# Patient Record
Sex: Female | Born: 1945
Health system: Southern US, Community
[De-identification: ages and names within clinical notes are randomized; demographics above are authoritative.]

## PROBLEM LIST (undated history)

## (undated) DIAGNOSIS — T7840XA Allergy, unspecified, initial encounter: Secondary | ICD-10-CM

## (undated) DIAGNOSIS — I00 Rheumatic fever without heart involvement: Secondary | ICD-10-CM

## (undated) DIAGNOSIS — S129XXA Fracture of neck, unspecified, initial encounter: Secondary | ICD-10-CM

## (undated) DIAGNOSIS — D869 Sarcoidosis, unspecified: Secondary | ICD-10-CM

## (undated) DIAGNOSIS — F32A Depression, unspecified: Secondary | ICD-10-CM

## (undated) DIAGNOSIS — E785 Hyperlipidemia, unspecified: Secondary | ICD-10-CM

## (undated) DIAGNOSIS — G2581 Restless legs syndrome: Secondary | ICD-10-CM

## (undated) DIAGNOSIS — I209 Angina pectoris, unspecified: Secondary | ICD-10-CM

## (undated) DIAGNOSIS — E039 Hypothyroidism, unspecified: Secondary | ICD-10-CM

## (undated) DIAGNOSIS — R51 Headache: Secondary | ICD-10-CM

## (undated) DIAGNOSIS — Z86010 Personal history of colonic polyps: Secondary | ICD-10-CM

## (undated) DIAGNOSIS — S322XXA Fracture of coccyx, initial encounter for closed fracture: Secondary | ICD-10-CM

## (undated) DIAGNOSIS — S129XXD Fracture of neck, unspecified, subsequent encounter: Secondary | ICD-10-CM

## (undated) DIAGNOSIS — S2222XA Fracture of body of sternum, initial encounter for closed fracture: Secondary | ICD-10-CM

## (undated) DIAGNOSIS — S82841A Displaced bimalleolar fracture of right lower leg, initial encounter for closed fracture: Secondary | ICD-10-CM

## (undated) DIAGNOSIS — E079 Disorder of thyroid, unspecified: Secondary | ICD-10-CM

## (undated) DIAGNOSIS — M797 Fibromyalgia: Secondary | ICD-10-CM

## (undated) DIAGNOSIS — Z9889 Other specified postprocedural states: Secondary | ICD-10-CM

## (undated) DIAGNOSIS — R519 Headache, unspecified: Secondary | ICD-10-CM

## (undated) DIAGNOSIS — K759 Inflammatory liver disease, unspecified: Secondary | ICD-10-CM

## (undated) DIAGNOSIS — R079 Chest pain, unspecified: Secondary | ICD-10-CM

## (undated) DIAGNOSIS — F329 Major depressive disorder, single episode, unspecified: Secondary | ICD-10-CM

## (undated) DIAGNOSIS — R112 Nausea with vomiting, unspecified: Secondary | ICD-10-CM

## (undated) DIAGNOSIS — R937 Abnormal findings on diagnostic imaging of other parts of musculoskeletal system: Secondary | ICD-10-CM

## (undated) HISTORY — DX: Fracture of coccyx, initial encounter for closed fracture: S32.2XXA

## (undated) HISTORY — DX: Abnormal findings on diagnostic imaging of other parts of musculoskeletal system: R93.7

## (undated) HISTORY — DX: Displaced bimalleolar fracture of right lower leg, initial encounter for closed fracture: S82.841A

## (undated) HISTORY — DX: Allergy, unspecified, initial encounter: T78.40XA

## (undated) HISTORY — DX: Chest pain, unspecified: R07.9

## (undated) HISTORY — DX: Fracture of neck, unspecified, subsequent encounter: S12.9XXD

## (undated) HISTORY — DX: Inflammatory liver disease, unspecified: K75.9

## (undated) HISTORY — DX: Restless legs syndrome: G25.81

## (undated) HISTORY — DX: Rheumatic fever without heart involvement: I00

## (undated) HISTORY — DX: Fracture of body of sternum, initial encounter for closed fracture: S22.22XA

## (undated) HISTORY — DX: Hyperlipidemia, unspecified: E78.5

## (undated) HISTORY — DX: Fracture of neck, unspecified, initial encounter: S12.9XXA

## (undated) HISTORY — DX: Angina pectoris, unspecified: I20.9

## (undated) HISTORY — DX: Personal history of colonic polyps: Z86.010

## (undated) HISTORY — DX: Disorder of thyroid, unspecified: E07.9

## (undated) HISTORY — PX: CATARACT EXTRACTION, BILATERAL: SHX1313

---
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
## 1976-12-28 HISTORY — PX: TUBAL LIGATION: SHX77

## 1998-06-17 ENCOUNTER — Other Ambulatory Visit: Admission: RE | Admit: 1998-06-17 | Discharge: 1998-06-17 | Payer: Self-pay | Admitting: Obstetrics and Gynecology

## 2002-10-12 ENCOUNTER — Encounter: Payer: Self-pay | Admitting: Obstetrics and Gynecology

## 2002-10-12 ENCOUNTER — Encounter: Admission: RE | Admit: 2002-10-12 | Discharge: 2002-10-12 | Payer: Self-pay | Admitting: Obstetrics and Gynecology

## 2005-04-22 ENCOUNTER — Ambulatory Visit: Payer: Self-pay | Admitting: Internal Medicine

## 2005-05-04 ENCOUNTER — Ambulatory Visit: Payer: Self-pay | Admitting: Internal Medicine

## 2005-05-05 ENCOUNTER — Encounter: Admission: RE | Admit: 2005-05-05 | Discharge: 2005-05-05 | Payer: Self-pay | Admitting: Obstetrics and Gynecology

## 2005-05-14 ENCOUNTER — Ambulatory Visit: Payer: Self-pay | Admitting: Internal Medicine

## 2005-05-28 ENCOUNTER — Encounter (INDEPENDENT_AMBULATORY_CARE_PROVIDER_SITE_OTHER): Payer: Self-pay | Admitting: *Deleted

## 2005-05-28 ENCOUNTER — Ambulatory Visit: Payer: Self-pay | Admitting: Internal Medicine

## 2005-05-28 DIAGNOSIS — Z860100 Personal history of colon polyps, unspecified: Secondary | ICD-10-CM

## 2005-05-28 HISTORY — DX: Personal history of colon polyps, unspecified: Z86.0100

## 2005-05-28 HISTORY — DX: Personal history of colonic polyps: Z86.010

## 2007-04-29 ENCOUNTER — Ambulatory Visit: Payer: Self-pay | Admitting: Internal Medicine

## 2007-07-18 ENCOUNTER — Telehealth: Payer: Self-pay | Admitting: Internal Medicine

## 2007-07-26 ENCOUNTER — Encounter (INDEPENDENT_AMBULATORY_CARE_PROVIDER_SITE_OTHER): Payer: Self-pay

## 2007-08-19 ENCOUNTER — Telehealth (INDEPENDENT_AMBULATORY_CARE_PROVIDER_SITE_OTHER): Payer: Self-pay

## 2008-05-18 ENCOUNTER — Ambulatory Visit: Payer: Self-pay | Admitting: Internal Medicine

## 2008-05-18 LAB — CONVERTED CEMR LAB
ALT: 25 units/L (ref 0–35)
AST: 24 units/L (ref 0–37)
Albumin: 3.6 g/dL (ref 3.5–5.2)
Alkaline Phosphatase: 92 units/L (ref 39–117)
BUN: 11 mg/dL (ref 6–23)
Basophils Absolute: 0.1 10*3/uL (ref 0.0–0.1)
Basophils Relative: 1 % (ref 0.0–1.0)
Bilirubin Urine: NEGATIVE
Bilirubin, Direct: 0.1 mg/dL (ref 0.0–0.3)
Blood in Urine, dipstick: NEGATIVE
CO2: 28 meq/L (ref 19–32)
Calcium: 9 mg/dL (ref 8.4–10.5)
Chloride: 102 meq/L (ref 96–112)
Cholesterol: 244 mg/dL (ref 0–200)
Creatinine, Ser: 0.7 mg/dL (ref 0.4–1.2)
Direct LDL: 175.2 mg/dL
Eosinophils Absolute: 0.4 10*3/uL (ref 0.0–0.7)
Eosinophils Relative: 5.8 % — ABNORMAL HIGH (ref 0.0–5.0)
GFR calc Af Amer: 109 mL/min
GFR calc non Af Amer: 90 mL/min
Glucose, Bld: 89 mg/dL (ref 70–99)
Glucose, Urine, Semiquant: NEGATIVE
HCT: 40 % (ref 36.0–46.0)
HDL: 44.7 mg/dL (ref >39.0)
Hemoglobin: 13.3 g/dL (ref 12.0–15.0)
Ketones, urine, test strip: NEGATIVE
Lymphocytes Relative: 26.3 % (ref 12.0–46.0)
MCHC: 33.4 g/dL (ref 30.0–36.0)
MCV: 92.5 fL (ref 78.0–100.0)
Monocytes Absolute: 0.6 10*3/uL (ref 0.1–1.0)
Monocytes Relative: 8.5 % (ref 3.0–12.0)
Neutro Abs: 4.5 10*3/uL (ref 1.4–7.7)
Neutrophils Relative %: 58.4 % (ref 43.0–77.0)
Nitrite: NEGATIVE
Platelets: 299 10*3/uL (ref 150–400)
Potassium: 4.4 meq/L (ref 3.5–5.1)
Protein, U semiquant: NEGATIVE
RBC: 4.32 M/uL (ref 3.87–5.11)
RDW: 12.8 % (ref 11.5–14.6)
Sodium: 135 meq/L (ref 135–145)
Specific Gravity, Urine: 1.015
TSH: 2.18 microintl units/mL (ref 0.35–5.50)
Total Bilirubin: 0.6 mg/dL (ref 0.3–1.2)
Total CHOL/HDL Ratio: 5.5
Total Protein: 6.9 g/dL (ref 6.0–8.3)
Triglycerides: 159 mg/dL — ABNORMAL HIGH (ref 0–149)
Urobilinogen, UA: 0.2
VLDL: 32 mg/dL (ref 0–40)
WBC Urine, dipstick: NEGATIVE
WBC: 7.6 10*3/uL (ref 4.5–10.5)
pH: 7

## 2008-05-31 ENCOUNTER — Ambulatory Visit: Payer: Self-pay | Admitting: Internal Medicine

## 2009-09-26 ENCOUNTER — Ambulatory Visit: Payer: Self-pay | Admitting: Internal Medicine

## 2009-09-26 LAB — CONVERTED CEMR LAB
ALT: 19 units/L (ref 0–35)
AST: 25 units/L (ref 0–37)
Albumin: 4 g/dL (ref 3.5–5.2)
Alkaline Phosphatase: 92 units/L (ref 39–117)
BUN: 24 mg/dL — ABNORMAL HIGH (ref 6–23)
Basophils Absolute: 0.1 10*3/uL (ref 0.0–0.1)
Basophils Relative: 0.8 % (ref 0.0–3.0)
Bilirubin, Direct: 0 mg/dL (ref 0.0–0.3)
CO2: 31 meq/L (ref 19–32)
Calcium: 9.4 mg/dL (ref 8.4–10.5)
Chloride: 101 meq/L (ref 96–112)
Creatinine, Ser: 0.7 mg/dL (ref 0.4–1.2)
Eosinophils Absolute: 0.2 10*3/uL (ref 0.0–0.7)
Eosinophils Relative: 2.7 % (ref 0.0–5.0)
GFR calc non Af Amer: 89.43 mL/min (ref >60)
Glucose, Bld: 90 mg/dL (ref 70–99)
HCT: 38.4 % (ref 36.0–46.0)
Hemoglobin: 13.5 g/dL (ref 12.0–15.0)
Lymphocytes Relative: 28.5 % (ref 12.0–46.0)
Lymphs Abs: 2.3 10*3/uL (ref 0.7–4.0)
MCHC: 35.2 g/dL (ref 30.0–36.0)
MCV: 92.8 fL (ref 78.0–100.0)
Monocytes Absolute: 0.5 10*3/uL (ref 0.1–1.0)
Monocytes Relative: 6.6 % (ref 3.0–12.0)
Neutro Abs: 5.1 10*3/uL (ref 1.4–7.7)
Neutrophils Relative %: 61.4 % (ref 43.0–77.0)
Platelets: 294 10*3/uL (ref 150.0–400.0)
Potassium: 4.4 meq/L (ref 3.5–5.1)
RBC: 4.14 M/uL (ref 3.87–5.11)
RDW: 12.4 % (ref 11.5–14.6)
Sed Rate: 20 mm/hr (ref 0–22)
Sodium: 139 meq/L (ref 135–145)
TSH: 2.46 microintl units/mL (ref 0.35–5.50)
Total Bilirubin: 0.7 mg/dL (ref 0.3–1.2)
Total Protein: 7.1 g/dL (ref 6.0–8.3)
WBC: 8.2 10*3/uL (ref 4.5–10.5)

## 2009-10-01 ENCOUNTER — Telehealth: Payer: Self-pay | Admitting: Internal Medicine

## 2009-10-25 ENCOUNTER — Ambulatory Visit: Payer: Self-pay | Admitting: Internal Medicine

## 2010-02-25 ENCOUNTER — Telehealth: Payer: Self-pay

## 2010-03-03 ENCOUNTER — Ambulatory Visit: Payer: Self-pay | Admitting: Internal Medicine

## 2010-03-03 LAB — CONVERTED CEMR LAB
ALT: 17 units/L (ref 0–35)
AST: 23 units/L (ref 0–37)
Albumin: 4.1 g/dL (ref 3.5–5.2)
Alkaline Phosphatase: 78 units/L (ref 39–117)
BUN: 15 mg/dL (ref 6–23)
Basophils Absolute: 0.1 10*3/uL (ref 0.0–0.1)
Basophils Relative: 1.2 % (ref 0.0–3.0)
Bilirubin Urine: NEGATIVE
Bilirubin, Direct: 0.1 mg/dL (ref 0.0–0.3)
CO2: 30 meq/L (ref 19–32)
Calcium: 9 mg/dL (ref 8.4–10.5)
Chloride: 102 meq/L (ref 96–112)
Cholesterol: 182 mg/dL (ref 0–200)
Creatinine, Ser: 0.6 mg/dL (ref 0.4–1.2)
Eosinophils Absolute: 0.2 10*3/uL (ref 0.0–0.7)
Eosinophils Relative: 3.8 % (ref 0.0–5.0)
GFR calc non Af Amer: 106.7 mL/min (ref >60)
Glucose, Bld: 99 mg/dL (ref 70–99)
HCT: 41.2 % (ref 36.0–46.0)
HDL: 78.2 mg/dL (ref >39.00)
Hemoglobin, Urine: NEGATIVE
Hemoglobin: 13.5 g/dL (ref 12.0–15.0)
Ketones, ur: NEGATIVE mg/dL
LDL Cholesterol: 92 mg/dL (ref 0–99)
Leukocytes, UA: NEGATIVE
Lymphocytes Relative: 28.8 % (ref 12.0–46.0)
Lymphs Abs: 1.7 10*3/uL (ref 0.7–4.0)
MCHC: 32.7 g/dL (ref 30.0–36.0)
MCV: 95.6 fL (ref 78.0–100.0)
Monocytes Absolute: 0.5 10*3/uL (ref 0.1–1.0)
Monocytes Relative: 8.5 % (ref 3.0–12.0)
Neutro Abs: 3.4 10*3/uL (ref 1.4–7.7)
Neutrophils Relative %: 57.7 % (ref 43.0–77.0)
Nitrite: NEGATIVE
Platelets: 288 10*3/uL (ref 150.0–400.0)
Potassium: 4.5 meq/L (ref 3.5–5.1)
RBC: 4.31 M/uL (ref 3.87–5.11)
RDW: 12.3 % (ref 11.5–14.6)
Sodium: 136 meq/L (ref 135–145)
Specific Gravity, Urine: 1.015 (ref 1.000–1.030)
TSH: 2.78 microintl units/mL (ref 0.35–5.50)
Total Bilirubin: 0.5 mg/dL (ref 0.3–1.2)
Total CHOL/HDL Ratio: 2
Total Protein, Urine: NEGATIVE mg/dL
Total Protein: 7.4 g/dL (ref 6.0–8.3)
Triglycerides: 58 mg/dL (ref 0.0–149.0)
Urine Glucose: NEGATIVE mg/dL
Urobilinogen, UA: 0.2 (ref 0.0–1.0)
VLDL: 11.6 mg/dL (ref 0.0–40.0)
WBC: 5.9 10*3/uL (ref 4.5–10.5)
pH: 8 (ref 5.0–8.0)

## 2010-03-06 ENCOUNTER — Encounter: Admission: RE | Admit: 2010-03-06 | Discharge: 2010-03-06 | Payer: Self-pay | Admitting: Internal Medicine

## 2010-03-06 LAB — HM MAMMOGRAPHY: HM Mammogram: NEGATIVE

## 2010-03-10 ENCOUNTER — Ambulatory Visit: Payer: Self-pay | Admitting: Internal Medicine

## 2010-05-28 ENCOUNTER — Encounter (INDEPENDENT_AMBULATORY_CARE_PROVIDER_SITE_OTHER): Payer: Self-pay | Admitting: *Deleted

## 2010-08-13 ENCOUNTER — Telehealth: Payer: Self-pay | Admitting: Internal Medicine

## 2010-09-10 ENCOUNTER — Encounter (INDEPENDENT_AMBULATORY_CARE_PROVIDER_SITE_OTHER): Payer: Self-pay | Admitting: *Deleted

## 2010-09-11 ENCOUNTER — Ambulatory Visit: Payer: Self-pay | Admitting: Internal Medicine

## 2010-09-18 ENCOUNTER — Telehealth (INDEPENDENT_AMBULATORY_CARE_PROVIDER_SITE_OTHER): Payer: Self-pay | Admitting: *Deleted

## 2010-09-23 ENCOUNTER — Ambulatory Visit: Payer: Self-pay | Admitting: Internal Medicine

## 2010-09-23 LAB — HM COLONOSCOPY

## 2010-09-27 HISTORY — PX: COLONOSCOPY: SHX174

## 2010-10-02 ENCOUNTER — Encounter: Payer: Self-pay | Admitting: Internal Medicine

## 2010-11-28 ENCOUNTER — Telehealth: Payer: Self-pay | Admitting: Internal Medicine

## 2011-01-27 NOTE — Progress Notes (Signed)
Summary: leg numbness  Phone Note Call from Patient Call back at Home Phone [phone]   Caller: Patient Call For: KWIA Reason for Call: Talk to Nurse Summary of Call: C/O LEG ISSUES. NUMBNESS.PLEASR ADVISE. PT ALSO WANTED TO KNOW COULD IT BE HER MEDS? Initial call taken by: Warnell Forester,  July 18, 2007 11:15 AM  Follow-up for Phone Call        I will need to review chart please  Additional Follow-up for Phone Call Additional follow up Details #1::        Discussed with patient.  Has numbness involving l lat lower leg after trauma.  Mirapex has helped the RLS.  Will call if any new symptoms    working all day.  There is on her right

## 2011-01-27 NOTE — Assessment & Plan Note (Signed)
Summary: ?skin irratation/feels like having RLS all over/cjr   Vital Signs:  Patient profile:   65 year old female Weight:      161 pounds BMI:     28.17 Temp:     98.6 degrees F oral BP sitting:   122 / 78  (left arm) Cuff size:   regular  Vitals Entered By: Raechel Ache, RN (September 26, 2009 10:03 AM) CC: C/o itchy, prickly feeling all over. Is Patient Diabetic? No Flu Vaccine Consent Questions     Do you have a history of severe allergic reactions to this vaccine? no    Any prior history of allergic reactions to egg and/or gelatin? no    Do you have a sensitivity to the preservative Thimersol? no    Do you have a past history of Guillan-Barre Syndrome? no    Do you currently have an acute febrile illness? no    Have you ever had a severe reaction to latex? no    Vaccine information given and explained to patient? yes    Are you currently pregnant? no    Lot Number:[id number]   Exp Date:06/26/2010   Site Given  Left Deltoid IM   CC:  C/o itchy and prickly feeling all over.Marland Kitchen  History of Present Illness: 65 year old patient, who presents with a few week history of pruritus without rash.  She is at a very stressful summer and earlier.  This was associated with some hair loss.  The hair loss seems to have resolved, but she continues to be bothered by itching.  There have been no new medications.  She has been on Claritin once daily chronically.  Symptoms have worsened recently in spite of improvement in her stressors.  She has a history of fibromyalgia, what seems to be stable.  Allergies: No Known Drug Allergies  Past History:  Past Medical History: Reviewed history from 05/31/2008 and no changes required. fibromyalgia remote history hepatitis at age 52 gravida 3, para 3, abortus zero history of acute rheumatic fever Colonic polyps, hx of restless leg syndrome  Review of Systems  The patient denies anorexia, fever, weight loss, weight gain, vision loss, decreased  hearing, hoarseness, chest pain, syncope, dyspnea on exertion, peripheral edema, prolonged cough, headaches, hemoptysis, abdominal pain, melena, hematochezia, severe indigestion/heartburn, hematuria, incontinence, genital sores, muscle weakness, suspicious skin lesions, transient blindness, difficulty walking, depression, unusual weight change, abnormal bleeding, enlarged lymph nodes, angioedema, and breast masses.    Physical Exam  General:  Well-developed,well-nourished,in no acute distress; alert,appropriate and cooperative throughout examination Head:  Normocephalic and atraumatic without obvious abnormalities. No apparent alopecia or balding. Eyes:  No corneal or conjunctival inflammation noted. EOMI. Perrla. Funduscopic exam benign, without hemorrhages, exudates or papilledema. Vision grossly normal. Ears:  External ear exam shows no significant lesions or deformities.  Otoscopic examination reveals clear canals, tympanic membranes are intact bilaterally without bulging, retraction, inflammation or discharge. Hearing is grossly normal bilaterally. Mouth:  Oral mucosa and oropharynx without lesions or exudates.  Teeth in good repair. Neck:  No deformities, masses, or tenderness noted. Lungs:  Normal respiratory effort, chest expands symmetrically. Lungs are clear to auscultation, no crackles or wheezes. Heart:  Normal rate and regular rhythm. S1 and S2 normal without gallop, murmur, click, rub or other extra sounds. Abdomen:  Bowel sounds positive,abdomen soft and non-tender without masses, organomegaly or hernias noted. Msk:  No deformity or scoliosis noted of thoracic or lumbar spine.   Pulses:  R and L carotid,radial,femoral,dorsalis pedis and posterior tibial  pulses are full and equal bilaterally   Impression & Recommendations:  Problem # 1:  PRURITUS (ICD-698.9)  Problem # 2:  FIBROMYALGIA (ICD-729.1)  Her updated medication list for this problem includes:    Adult Aspirin Ec Low  Strength 81 Mg Tbec (Aspirin) ..... Once daily    Aleve 220 Mg Tabs (Naproxen sodium) .Marland Kitchen..Marland Kitchen Two times a day    Her updated medication list for this problem includes:    Adult Aspirin Ec Low Strength 81 Mg Tbec (Aspirin) ..... Once daily    Aleve 220 Mg Tabs (Naproxen sodium) .Marland Kitchen..Marland Kitchen Two times a day  Complete Medication List: 1)  Zoloft 100 Mg Tabs (Sertraline hcl) .Marland Kitchen.. 1 once daily 2)  Adult Aspirin Ec Low Strength 81 Mg Tbec (Aspirin) .... Once daily 3)  Claritin 10 Mg Tabs (Loratadine) .... Take 1 tablet by mouth once a day 4)  Daily Multiple Vitamins Tabs (Multiple vitamin) .... Take 1 tablet by mouth once a day 5)  Glucosamine-chondroitin 1500-1200 Mg/73ml Liqd (Glucosamine-chondroitin) .... Take 1 tablet by mouth once a day 6)  Ok-garlic Tabs (Garlic) .... Take 1 tablet by mouth once a day 7)  Coq10 30 Mg Caps (Coenzyme q10) .... 60 mg  by mouth once a day 8)  Calcium 600/vitamin D 600-400 Mg-unit Tabs (Calcium carbonate-vitamin d) .... Two two times a day 9)  Aleve 220 Mg Tabs (Naproxen sodium) .... Two times a day 10)  Finacea 15 % Gel (Azelaic acid) .... Two times a day for rasacea 11)  Mirapex 0.5 Mg Tabs (Pramipexole dihydrochloride) .Marland Kitchen.. 1 at bedtime 12)  Acid Reducer Maximum Strength 150 Mg Tabs (Ranitidine hcl) .... One twice daily 13)  Hydroxyzine Hcl 50 Mg Tabs (Hydroxyzine hcl) .... One every 6 hours as needed for itching  Other Orders: Admin 1st Vaccine (62694) Flu Vaccine 66yrs + (85462) Venipuncture (70350) TLB-BMP (Basic Metabolic Panel-BMET) (80048-METABOL) TLB-CBC Platelet - w/Differential (85025-CBCD) TLB-Hepatic/Liver Function Pnl (80076-HEPATIC) TLB-TSH (Thyroid Stimulating Hormone) (84443-TSH) TLB-Sedimentation Rate (ESR) (85652-ESR)  Patient Instructions: 1)  Please schedule a follow-up appointment as needed. 2)  Schedule CPX Prescriptions: HYDROXYZINE HCL 50 MG TABS (HYDROXYZINE HCL) one every 6 hours as needed for itching  #60 x 2   Entered and  Authorized by:   Gordy Savers  MD   Signed by:   Gordy Savers  MD on 09/26/2009   Method used:   Print then Give to Patient   RxID:   [id number] ACID REDUCER MAXIMUM STRENGTH 150 MG TABS (RANITIDINE HCL) one twice daily  #180 x 0   Entered and Authorized by:   Gordy Savers  MD   Signed by:   Gordy Savers  MD on 09/26/2009   Method used:   Print then Give to Patient   RxID:   [id number] MIRAPEX 0.5 MG TABS (PRAMIPEXOLE DIHYDROCHLORIDE) 1 at bedtime  #90 x 6   Entered and Authorized by:   Gordy Savers  MD   Signed by:   Gordy Savers  MD on 09/26/2009   Method used:   Print then Give to Patient   RxID:   [id number] CLARITIN 10 MG  TABS (LORATADINE) Take 1 tablet by mouth once a day  #90 x 6   Entered and Authorized by:   Gordy Savers  MD   Signed by:   Gordy Savers  MD on 09/26/2009   Method used:   Print then Give to Patient   RxID:   [id number]

## 2011-01-27 NOTE — Progress Notes (Signed)
Summary: prep ?'s  Phone Note Call from Patient Call back at Home Phone [phone]   Caller: Patient Call For: Dr. Leone Payor Reason for Call: Talk to Nurse Summary of Call: prep ?'s Initial call taken by: Vallarie Mare,  September 18, 2010 3:52 PM  Follow-up for Phone Call        Phone Call Completed Follow-up by: Wyona Almas RN,  September 18, 2010 5:06 PM

## 2011-01-27 NOTE — Letter (Signed)
Summary: Colonoscopy Letter  [facility]  [address] [place], [place] [address]   Phone: [phone]  Fax: [phone]      May 28, 2010 MRN: [id number]   Grace Schmitt [address] [place], [place]  [address]   Dear Ms. Clinkenbeard,   According to your medical record, it is time for you to schedule a Colonoscopy. The American Cancer Society recommends this procedure as a method to detect early colon cancer. Patients with a family history of colon cancer, or a personal history of colon polyps or inflammatory bowel disease are at increased risk.  This letter has beeen generated based on the recommendations made at the time of your procedure. If you feel that in your particular situation this may no longer apply, please contact our office.  Please call our office at [phone] to schedule this appointment or to update your records at your earliest convenience.  Thank you for cooperating with [place] to provide you with the very best care possible.   Sincerely,  Iva Boop, M.D.  [facility] [phone]

## 2011-01-27 NOTE — Progress Notes (Signed)
Summary: lab results  Phone Note Call from Patient   Caller: Patient Call For: Grace Savers  MD Summary of Call: Pt would like lab results, and copy. [phone] Initial call taken by: Lynann Beaver CMA,  October 01, 2009 12:21 PM  Follow-up for Phone Call        OK- all lab  the OK  Pt notified and labs sent. Lynann Beaver CMA  October 01, 2009 1:20 PM  Follow-up by: Grace Savers  MD,  October 01, 2009 12:59 PM

## 2011-01-27 NOTE — Progress Notes (Signed)
Summary: restless legs again though Mirapex has done well till now double  Phone Note Call from Patient Call back at [phone]   Caller: vm Summary of Call: Mirapex 0.5mg  not controlling restlest legs.  Bothers her a lot pm & before it's time to take the med again.  Can med be increased or other suggestions?  Says she's already tried Requip & L something that made her unable to function.  Mirapex had been working.  Overall she's done well with it.  [facility].  Rudy Jew, RN  August 13, 2010 4:27 PM  Initial call taken by: Rudy Jew, RN,  August 13, 2010 3:56 PM  Follow-up for Phone Call        okay to increase Mirapex to 1 mg at bedtime Follow-up by: Gordy Savers  MD,  August 14, 2010 7:39 AM    New/Updated Medications: MIRAPEX 1 MG TABS (PRAMIPEXOLE DIHYDROCHLORIDE) one by mouth q hs Prescriptions: MIRAPEX 1 MG TABS (PRAMIPEXOLE DIHYDROCHLORIDE) one by mouth q hs  #90 x 1   Entered by:   Lynann Beaver CMA   Authorized by:   Gordy Savers  MD   Signed by:   Lynann Beaver CMA on 08/14/2010   Method used:   [id number]       [address] [place], [place]  [address]       Ph: [phone] or [phone]       Fax: [phone]   RxID:   [id number]  Notified pt.

## 2011-01-27 NOTE — Assessment & Plan Note (Signed)
Summary: pt fell off horse 2wks ago/pain lft breast and upper back/cjr   Vital Signs:  Patient profile:   65 year old female Weight:      162 pounds Temp:     98.5 degrees F oral BP sitting:   126 / 76  (left arm) Cuff size:   regular  Vitals Entered By: Raechel Ache, RN (October 25, 2009 2:34 PM) CC: Larey Seat off a horse few weeks ago on back. C/o soreness in back and sharp, stabbing pains L breast. Is Patient Diabetic? No   CC:  Larey Seat off a horse few weeks ago on back. C/o soreness in back and sharp and stabbing pains L breast..  History of Present Illness: 65 year old patient who fell off a horse while horseback riding, approximately 2 weeks ago.  She was evaluated at the urgent care one week ago.  Chest x-ray revealed no obvious fracture.  She is at persistent left chest wall pain.  Denies any shortness of breath.  X-rays from the urgent care reviewed.  No rib series performed  Allergies: No Known Drug Allergies  Physical Exam  General:  Well-developed,well-nourished,in no acute distress; alert,appropriate and cooperative throughout examination; normal blood pressure Chest Wall:  considerable tenderness with palpation involving the left chest wall anteriorly, posteriorly, and laterally Breasts:  the left breast appear normal Lungs:  Normal respiratory effort, chest expands symmetrically. Lungs are clear to auscultation, no crackles or wheezes.  normal respiratory effort.  symmetrical breath sounds Heart:  Normal rate and regular rhythm. S1 and S2 normal without gallop, murmur, click, rub or other extra sounds.   Impression & Recommendations:  Problem # 1:  CHEST WALL PAIN, ACUTE (NFA-213.08)  Her updated medication list for this problem includes:    Adult Aspirin Ec Low Strength 81 Mg Tbec (Aspirin) ..... Once daily    Aleve 220 Mg Tabs (Naproxen sodium) .Marland Kitchen..Marland Kitchen Two times a day  Complete Medication List: 1)  Zoloft 100 Mg Tabs (Sertraline hcl) .Marland Kitchen.. 1 once daily 2)  Adult  Aspirin Ec Low Strength 81 Mg Tbec (Aspirin) .... Once daily 3)  Claritin 10 Mg Tabs (Loratadine) .... Take 1 tablet by mouth once a day 4)  Daily Multiple Vitamins Tabs (Multiple vitamin) .... Take 1 tablet by mouth once a day 5)  Glucosamine-chondroitin 1500-1200 Mg/10ml Liqd (Glucosamine-chondroitin) .... Take 1 tablet by mouth once a day 6)  Ok-garlic Tabs (Garlic) .... Take 1 tablet by mouth once a day 7)  Coq10 30 Mg Caps (Coenzyme q10) .... 60 mg  by mouth once a day 8)  Calcium 600/vitamin D 600-400 Mg-unit Tabs (Calcium carbonate-vitamin d) .... Two two times a day 9)  Aleve 220 Mg Tabs (Naproxen sodium) .... Two times a day 10)  Finacea 15 % Gel (Azelaic acid) .... Two times a day for rasacea 11)  Mirapex 0.5 Mg Tabs (Pramipexole dihydrochloride) .Marland Kitchen.. 1 at bedtime 12)  Acid Reducer Maximum Strength 150 Mg Tabs (Ranitidine hcl) .... One twice daily 13)  Hydroxyzine Hcl 50 Mg Tabs (Hydroxyzine hcl) .... One every 6 hours as needed for itching  Patient Instructions: 1)  Celebrex daily 2)  Take 650-1000mg  of Tylenol every 4-6 hours as needed for relief of pain or comfort of fever AVOID taking more than 4000mg   in a 24 hour period (can cause liver damage in higher doses).

## 2011-01-27 NOTE — Letter (Signed)
Summary: [facility] Instructions  [facility]  [address] [place], [place] [address]   Phone: [phone]  Fax: [phone]       Grace Schmitt    06-14-1946    MRN: [id number]        Procedure Day Dorna Bloom: Rica Mote. 09/23/2010     Arrival Time: 1:30PM     Procedure Time: 2:30PM     Location of Procedure:                    _X_  [facility] (4th Floor)                        PREPARATION FOR COLONOSCOPY WITH MOVIPREP   Starting 5 days prior to your procedure 09/18/2010 do not eat nuts, seeds, popcorn, corn, beans, peas,  salads, or any raw vegetables.  Do not take any fiber supplements (e.g. Metamucil, Citrucel, and Benefiber).  THE DAY BEFORE YOUR PROCEDURE         DATE: 09/26    DAY: Mon.  1.  Drink clear liquids the entire day-NO SOLID FOOD  2.  Do not drink anything colored red or purple.  Avoid juices with pulp.  No orange juice.  3.  Drink at least 64 oz. (8 glasses) of fluid/clear liquids during the day to prevent dehydration and help the prep work efficiently.  CLEAR LIQUIDS INCLUDE: Water Jello Ice Popsicles Tea (sugar ok, no milk/cream) Powdered fruit flavored drinks Coffee (sugar ok, no milk/cream) Gatorade Juice: apple, white grape, white cranberry  Lemonade Clear bullion, consomm, broth Carbonated beverages (any kind) Strained chicken noodle soup Hard Candy                             4.  In the morning, mix first dose of MoviPrep solution:    Empty 1 Pouch A and 1 Pouch B into the disposable container    Add lukewarm drinking water to the top line of the container. Mix to dissolve    Refrigerate (mixed solution should be used within 24 hrs)  5.  Begin drinking the prep at 5:00 p.m. The MoviPrep container is divided by 4 marks.   Every 15 minutes drink the solution down to the next mark (approximately 8 oz) until the full liter is complete.   6.  Follow completed prep with 16 oz of clear liquid of your choice (Nothing  red or purple).  Continue to drink clear liquids until bedtime.  7.  Before going to bed, mix second dose of MoviPrep solution:    Empty 1 Pouch A and 1 Pouch B into the disposable container    Add lukewarm drinking water to the top line of the container. Mix to dissolve    Refrigerate  THE DAY OF YOUR PROCEDURE      DATE: 09/27    DAY: Rica Mote.  Beginning at 9:30AM (5 hours before procedure):         1. Every 15 minutes, drink the solution down to the next mark (approx 8 oz) until the full liter is complete.  2. Follow completed prep with 16 oz. of clear liquid of your choice.    3. You may drink clear liquids until 12:30PM (2 HOURS BEFORE PROCEDURE).   MEDICATION INSTRUCTIONS  Unless otherwise instructed, you should take regular prescription medications with a small sip of water   as early as possible the morning of  your procedure.           OTHER INSTRUCTIONS  You will need a responsible adult at least 66 years of age to accompany you and drive you home.   This person must remain in the waiting room during your procedure.  Wear loose fitting clothing that is easily removed.  Leave jewelry and other valuables at home.  However, you may wish to bring a book to read or  an iPod/MP3 player to listen to music as you wait for your procedure to start.  Remove all body piercing jewelry and leave at home.  Total time from sign-in until discharge is approximately 2-3 hours.  You should go home directly after your procedure and rest.  You can resume normal activities the  day after your procedure.  The day of your procedure you should not:   Drive   Make legal decisions   Operate machinery   Drink alcohol   Return to work  You will receive specific instructions about eating, activities and medications before you leave.    The above instructions have been reviewed and explained to me by  Ezra Sites RN  September 11, 2010 1:24 PM     I fully understand and  can verbalize these instructions _____________________________ Date _________

## 2011-01-27 NOTE — Assessment & Plan Note (Signed)
Summary: cpx/ccm   Vital Signs:  Patient Profile:   65 Years Old Female Height:     63.5 inches Weight:      211.5 pounds Temp:     98.0 degrees F Pulse rate:   76 / minute BP sitting:   120 / 78  Vitals Entered By: Sindy Guadeloupe RN (May 31, 2008 9:14 AM)                 Chief Complaint:  cpx - no pap.  History of Present Illness:  65 year old patient seen today for a wellness exam.  She has a history of fibromyalgia menopausal syndrome and restless leg syndrome.  She is doing quite well.  She does see GYN for her gynecologic care    Current Allergies (reviewed today): No known allergies   Past Medical History:    Reviewed history and no changes required:       fibromyalgia       remote history hepatitis at age 64       gravida 3, para 3, abortus zero       history of acute rheumatic fever       Colonic polyps, hx of       restless leg syndrome  Past Surgical History:    Reviewed history from 07/26/2007 and no changes required:       tonsillectomy 1951       tubal ligation 1978       negative Cardilate stress test 2003       colonoscopy 2006   Family History:    father died age 75, prostate cancer    mother died age 24, dementia, secondary to small vessel disease        On brother  Social History:    Married    Review of Systems       The patient complains of weight gain.  The patient denies anorexia, fever, weight loss, vision loss, decreased hearing, hoarseness, chest pain, syncope, dyspnea on exertion, peripheral edema, prolonged cough, headaches, hemoptysis, abdominal pain, melena, hematochezia, severe indigestion/heartburn, hematuria, incontinence, genital sores, muscle weakness, suspicious skin lesions, transient blindness, difficulty walking, depression, unusual weight change, abnormal bleeding, enlarged lymph nodes, angioedema, and breast masses.     Physical Exam  General:     overweight-appearing.  130/80 both arms Head:  Normocephalic and atraumatic without obvious abnormalities. No apparent alopecia or balding. Eyes:     No corneal or conjunctival inflammation noted. EOMI. Perrla. Funduscopic exam benign, without hemorrhages, exudates or papilledema. Vision grossly normal. Ears:     External ear exam shows no significant lesions or deformities.  Otoscopic examination reveals clear canals, tympanic membranes are intact bilaterally without bulging, retraction, inflammation or discharge. Hearing is grossly normal bilaterally. Mouth:     Oral mucosa and oropharynx without lesions or exudates.  Teeth in good repair. Neck:     No deformities, masses, or tenderness noted. Chest Wall:     No deformities, masses, or tenderness noted. Breasts:     No mass, nodules, thickening, tenderness, bulging, retraction, inflamation, nipple discharge or skin changes noted.   Lungs:     Normal respiratory effort, chest expands symmetrically. Lungs are clear to auscultation, no crackles or wheezes. Heart:     Normal rate and regular rhythm. S1 and S2 normal without gallop, murmur, click, rub or other extra sounds. Abdomen:     Bowel sounds positive,abdomen soft and non-tender without masses, organomegaly or hernias noted. Msk:  No deformity or scoliosis noted of thoracic or lumbar spine.   Pulses:     R and L carotid,radial,femoral,dorsalis pedis and posterior tibial pulses are full and equal bilaterally Extremities:     No clubbing, cyanosis, edema, or deformity noted with normal full range of motion of all joints.   Neurologic:     No cranial nerve deficits noted. Station and gait are normal. Plantar reflexes are down-going bilaterally. DTRs are symmetrical throughout. Sensory, motor and coordinative functions appear intact. Skin:     Intact without suspicious lesions or rashes Cervical Nodes:     No lymphadenopathy noted Axillary Nodes:     No palpable lymphadenopathy Inguinal Nodes:     No significant  adenopathy Psych:     Cognition and judgment appear intact. Alert and cooperative with normal attention span and concentration. No apparent delusions, illusions, hallucinations    Impression & Recommendations:  Problem # 1:  Preventive Health Care (ICD-V70.0)  Complete Medication List: 1)  Mirapex 0.50 Mg Tabs (pramipexole Dihydrochloride)  .Marland Kitchen.. 1 at bedtime 2)  Zoloft 100 Mg Tabs (Sertraline hcl) .Marland Kitchen.. 1 once daily 3)  Adult Aspirin Ec Low Strength 81 Mg Tbec (Aspirin) .... Once daily 4)  Claritin 10 Mg Tabs (Loratadine) .... Take 1 tablet by mouth once a day 5)  Daily Multiple Vitamins Tabs (Multiple vitamin) .... Take 1 tablet by mouth once a day 6)  Glucosamine-chondroitin 1500-1200 Mg/14ml Liqd (Glucosamine-chondroitin) .... Take 1 tablet by mouth once a day 7)  Ok-garlic Tabs (Garlic) .... Take 1 tablet by mouth once a day 8)  Coq10 30 Mg Caps (Coenzyme q10) .... 60 mg  by mouth once a day 9)  Calcium 600/vitamin D 600-400 Mg-unit Tabs (Calcium carbonate-vitamin d) .... Two two times a day 10)  Aleve 220 Mg Tabs (Naproxen sodium) .... Two times a day 11)  Finacea 15 % Gel (Azelaic acid) .... Two times a day for rasacea   Patient Instructions: 1)  Please schedule a follow-up appointment in 6 months 2)  It is important that you exercise regularly at least 20 minutes 5 times a week. If you develop chest pain, have severe difficulty breathing, or feel very tired , stop exercising immediately and seek medical attention. 3)  You need to lose weight. Consider a lower calorie diet and regular exercise.  4)  Schedule your mammogram. 5)  Take calcium +Vitamin D daily.   Prescriptions: ZOLOFT 100 MG  TABS (SERTRALINE HCL) 1 once daily  #90 x 6   Entered and Authorized by:   Gordy Savers  MD   Signed by:   Gordy Savers  MD on 05/31/2008   Method used:   Print then Give to Patient   RxID:   [id number] MIRAPEX 0.50 MG  TABS (PRAMIPEXOLE DIHYDROCHLORIDE) 1 at bedtime   #90 x 6   Entered and Authorized by:   Gordy Savers  MD   Signed by:   Gordy Savers  MD on 05/31/2008   Method used:   Print then Give to Patient   RxID:   [id number]  ]

## 2011-01-27 NOTE — Procedures (Signed)
Summary: Colonoscopy  Patient: [name] Note: All result statuses are Final unless otherwise noted.  Tests: (1) Colonoscopy (COL)   COL Colonoscopy           DONE     [facility]     [address]     [place], [place]  [address]           COLONOSCOPY PROCEDURE REPORT           PATIENT:  Grace, Schmitt  MR#:  [id number]     BIRTHDATE:  Sep 05, 1946, 64 yrs. old  GENDER:  female     PROCEDURE DATE:  09/23/2010     PROCEDURE:  Colonoscopy with snare polypectomy     ASA CLASS:  Class II     INDICATIONS:  surveillance and high-risk screening, history of     pre-cancerous (adenomatous) colon polyps diminutive adenoma     removed 2006     MEDICATIONS:   Fentanyl 50 mcg IV, Versed 7 mg IV           DESCRIPTION OF PROCEDURE:   After the risks benefits and     alternatives of the procedure were thoroughly explained, informed     consent was obtained.  Digital rectal exam was performed and     revealed no abnormalities.   The [id number] endoscope was     introduced through the anus and advanced to the cecum, which was     identified by both the appendix and ileocecal valve, without     limitations.  The quality of the prep was excellent, using     MoviPrep.  The instrument was then slowly withdrawn as the colon     was fully examined. Insertion: 8:47 minutes Withdrawal: 9:17     minutes     <<PROCEDUREIMAGES>>           FINDINGS:  Two polyps were found. Cecum (2mm) and transverse     (4mm). Polyps were snared without cautery. Retrieval was     successful.  This was otherwise a normal examination of the colon.     Retroflexed views in the rectum revealed no abnormalities.    The     scope was then withdrawn from the patient and the procedure     completed.           COMPLICATIONS:  None     ENDOSCOPIC IMPRESSION:     1) Two diminutive polyps removed     2) Otherwise normal examination, excellent prep     3) Personal history of diminutive adenoma removal 2006         REPEAT EXAM:  In for Colonoscopy, pending biopsy results.           Iva Boop, MD, Clement[facility] Graham           CC:  The Patient           n.     eSIGNED:   Iva Boop at 09/23/2010 03:39 PM           Donnamarie Poag, [id number]  Note: An exclamation mark (!) indicates a result that was not dispersed into the flowsheet. Document Creation Date: 09/23/2010 3:40 PM _______________________________________________________________________  (1) Order result status: Final Collection or observation date-time: 09/23/2010 15:33 Requested date-time:  Receipt date-time:  Reported date-time:  Referring Physician:   Ordering Physician: Stan Head [phone]) Specimen Source:  Source: Launa Grill Order Number: [id number] Lab site:   Appended Document: Colonoscopy  Procedures Next Due Date:    Colonoscopy: 09/2015

## 2011-01-27 NOTE — Assessment & Plan Note (Signed)
Summary: CPX W/ PAP // RS   Vital Signs:  Patient profile:   65 year old female Height:      62.5 inches Weight:      157 pounds Temp:     98.6 degrees F oral BP sitting:   128 / 80  (right arm) Cuff size:   regular  Vitals Entered By: Duard Brady LPN (March 10, 2010 2:14 PM) CC: cpx - last pap 2009 normal - Dr. Marvia Pickles? , last colo 2006 polyps removed Is Patient Diabetic? No   CC:  cpx - last pap 2009 normal - Dr. Marvia Pickles?  and last colo 2006 polyps removed.  History of Present Illness: 65 year old patient who is seen today for a health maintenance examination.  Medical problems include fibromyalgia.  She also has a history of colonic polyps and restless leg syndrome.  She had this quite well.  She does exercise regularly 6 days per week.  No concerns or complaints today.  Preventive Screening-Counseling & Management  Alcohol-Tobacco     Smoking Status: never  Caffeine-Diet-Exercise     Does Patient Exercise: yes  Allergies (verified): No Known Drug Allergies  Past History:  Past Medical History: Reviewed history from 05/31/2008 and no changes required. fibromyalgia remote history hepatitis at age 44 gravida 3, para 3, abortus zero history of acute rheumatic fever Colonic polyps, hx of restless leg syndrome  Past Surgical History: Reviewed history from 05/31/2008 and no changes required. tonsillectomy 1951 tubal ligation 1978 negative Cardilate stress test 2003 colonoscopy 2006  Family History: Reviewed history from 05/31/2008 and no changes required. father died age 39, prostate cancer mother died age 22, dementia, secondary to small vessel disease  One  brother- HTN   Social History: Reviewed history from 05/31/2008 and no changes required. Married Regular exercise-yes Smoking Status:  never Does Patient Exercise:  yes  Review of Systems  The patient denies anorexia, fever, weight loss, weight gain, vision loss, decreased hearing, hoarseness,  chest pain, syncope, dyspnea on exertion, peripheral edema, prolonged cough, headaches, hemoptysis, abdominal pain, melena, hematochezia, severe indigestion/heartburn, hematuria, incontinence, genital sores, muscle weakness, suspicious skin lesions, transient blindness, difficulty walking, depression, unusual weight change, abnormal bleeding, enlarged lymph nodes, angioedema, and breast masses.    Physical Exam  General:  overweight-appearing.  blood pressure 120/76overweight-appearing.   Head:  Normocephalic and atraumatic without obvious abnormalities. No apparent alopecia or balding. Eyes:  No corneal or conjunctival inflammation noted. EOMI. Perrla. Funduscopic exam benign, without hemorrhages, exudates or papilledema. Vision grossly normal. Ears:  External ear exam shows no significant lesions or deformities.  Otoscopic examination reveals clear canals, tympanic membranes are intact bilaterally without bulging, retraction, inflammation or discharge. Hearing is grossly normal bilaterally. Nose:  External nasal examination shows no deformity or inflammation. Nasal mucosa are pink and moist without lesions or exudates. Mouth:  Oral mucosa and oropharynx without lesions or exudates.  Teeth in good repair. Neck:  No deformities, masses, or tenderness noted. Chest Wall:  No deformities, masses, or tenderness noted. Breasts:  No mass, nodules, thickening, tenderness, bulging, retraction, inflamation, nipple discharge or skin changes noted.   Lungs:  Normal respiratory effort, chest expands symmetrically. Lungs are clear to auscultation, no crackles or wheezes. Heart:  Normal rate and regular rhythm. S1 and S2 normal without gallop, murmur, click, rub or other extra sounds. Abdomen:  Bowel sounds positive,abdomen soft and non-tender without masses, organomegaly or hernias noted. Msk:  No deformity or scoliosis noted of thoracic or lumbar spine.   Pulses:  R and L carotid,radial,femoral,dorsalis pedis  and posterior tibial pulses are full and equal bilaterally Extremities:  No clubbing, cyanosis, edema, or deformity noted with normal full range of motion of all joints.   Neurologic:  alert & oriented X3, cranial nerves II-XII intact, sensation intact to light touch, and gait normal.  alert & oriented X3, cranial nerves II-XII intact, sensation intact to light touch, and gait normal.   Skin:  Intact without suspicious lesions or rashes Cervical Nodes:  No lymphadenopathy noted Axillary Nodes:  No palpable lymphadenopathy Inguinal Nodes:  No significant adenopathy Psych:  Cognition and judgment appear intact. Alert and cooperative with normal attention span and concentration. No apparent delusions, illusions, hallucinations   Impression & Recommendations:  Problem # 1:   PHYSICAL EXAMINATION (ICD-V70.0)  Orders: EKG w/ Interpretation (93000)  Problem # 2:  COLONIC POLYPS, HX OF (ICD-V12.72)  Problem # 3:  FIBROMYALGIA (ICD-729.1)  Her updated medication list for this problem includes:    Adult Aspirin Ec Low Strength 81 Mg Tbec (Aspirin) ..... Once daily    Aleve 220 Mg Tabs (Naproxen sodium) .Marland Kitchen..Marland Kitchen Two times a day  Her updated medication list for this problem includes:    Adult Aspirin Ec Low Strength 81 Mg Tbec (Aspirin) ..... Once daily    Aleve 220 Mg Tabs (Naproxen sodium) .Marland Kitchen..Marland Kitchen Two times a day  Complete Medication List: 1)  Adult Aspirin Ec Low Strength 81 Mg Tbec (Aspirin) .... Once daily 2)  Claritin 10 Mg Tabs (Loratadine) .... Take 1 tablet by mouth once a day 3)  Daily Multiple Vitamins Tabs (Multiple vitamin) .... Take 1 tablet by mouth once a day 4)  Glucosamine-chondroitin 1500-1200 Mg/28ml Liqd (Glucosamine-chondroitin) .... Take 1 tablet by mouth once a day 5)  Coq10 30 Mg Caps (Coenzyme q10) .... 60 mg  by mouth once a day 6)  Calcium 600/vitamin D 600-400 Mg-unit Tabs (Calcium carbonate-vitamin d) .... Two two times a day 7)  Aleve 220 Mg Tabs (Naproxen sodium)  .... Two times a day 8)  Finacea 15 % Gel (Azelaic acid) .... Two times a day for rasacea 9)  Mirapex 0.5 Mg Tabs (Pramipexole dihydrochloride) .Marland Kitchen.. 1 at bedtime 10)  Acid Reducer Maximum Strength 150 Mg Tabs (Ranitidine hcl) .... One twice daily 11)  Hydroxyzine Hcl 50 Mg Tabs (Hydroxyzine hcl) .... One every 6 hours as needed for itching  Patient Instructions: 1)  Please schedule a follow-up appointment in 1 year. 2)  Limit your Sodium (Salt) to less than 2 grams a day(slightly less than 1/2 a teaspoon) to prevent fluid retention, swelling, or worsening of symptoms. 3)  It is important that you exercise regularly at least 20 minutes 5 times a week. If you develop chest pain, have severe difficulty breathing, or feel very tired , stop exercising immediately and seek medical attention. 4)  You need to lose weight. Consider a lower calorie diet and regular exercise.  5)  Schedule a colonoscopy/sigmoidoscopy to help detect colon cancer. 6)  Take calcium +Vitamin D daily. Prescriptions: ACID REDUCER MAXIMUM STRENGTH 150 MG TABS (RANITIDINE HCL) one twice daily  #180 x 6   Entered and Authorized by:   Gordy Savers  MD   Signed by:   Gordy Savers  MD on 03/10/2010   Method used:   Print then Give to Patient   RxID:   [id number] MIRAPEX 0.5 MG TABS (PRAMIPEXOLE DIHYDROCHLORIDE) 1 at bedtime  #90 x 6   Entered and Authorized by:   Theron Arista  Lysle Dingwall  MD   Signed by:   Gordy Savers  MD on 03/10/2010   Method used:   Print then Give to Patient   RxID:   [id number] FINACEA 15 %  GEL (AZELAIC ACID) two times a day for rasacea  #60 gm x 6   Entered and Authorized by:   Gordy Savers  MD   Signed by:   Gordy Savers  MD on 03/10/2010   Method used:   Print then Give to Patient   RxID:   [id number] CLARITIN 10 MG  TABS (LORATADINE) Take 1 tablet by mouth once a day  #90 x 6   Entered and Authorized by:   Gordy Savers  MD   Signed by:   Gordy Savers  MD on 03/10/2010   Method used:   Print then Give to Patient   RxID:   [id number]

## 2011-01-27 NOTE — Progress Notes (Signed)
Summary: REQ FOR REFERRAL??  Phone Note Call from Patient   Caller: Patient [phone] Reason for Call: Talk to Nurse, Talk to Doctor Summary of Call: Pt called to adv that per her insurance [facility]), she will need a referral for a mammogram??.... Pt would like to have a mammogram done prior to her CPX appt w/ Dr Kirtland Bouchard on 03/10/2010..... Pt would like a return call to advise @ [phone].  Initial call taken by: Debbra Riding,  February 25, 2010 10:30 AM  Follow-up for Phone Call        ok Follow-up by: Gordy Savers  MD,  February 25, 2010 11:06 AM  Additional Follow-up for Phone Call Additional follow up Details #1::        Order to be put in for referral (then sent to referral coodinator so pt can have mammogram done prior to cpx w/ Dr Kirtland Bouchard...? Additional Follow-up by: Debbra Riding,  February 25, 2010 11:31 AM    Additional Follow-up for Phone Call Additional follow up Details #2::    called pt - does not remember when last one was done - several years ago . Didnt have one past cpx 05/2008 as requested by Dr. Amador Cunas at that time. She was on web site to schedule and "it said that I needed a referral" . I infoprmed her that may of meant a doctors order. She is going to call and see if she can make the appt and will let me know if she needs just an order vs. referral.  KIK Follow-up by: Duard Brady LPN,  February 25, 2010 11:43 AM

## 2011-01-27 NOTE — Progress Notes (Signed)
  Phone Note Call from Patient Call back at Home Phone [phone] Call back at [phone]   Caller: Patient Call For: [id number] Summary of Call: PT REQUEST NEW PRESCRIPTION ZOLOFT 100 MG  [facility] Initial call taken by: Barnie Mort,  August 19, 2007 10:47 AM    New/Updated Medications: ZOLOFT 100 MG  TABS (SERTRALINE HCL) 1 once daily   Prescriptions: MIRAPEX 0.25 MG  TABS (PRAMIPEXOLE DIHYDROCHLORIDE) 1 at bedtime  #30 x 6   Entered and Authorized by:   Raechel Ache, RN   Signed by:   Raechel Ache, RN on 08/19/2007   Method used:   Print then Give to Patient   RxID:   [id number]     Appended Document:     Clinical Lists Changes  Medications: Rx of ZOLOFT 100 MG  TABS (SERTRALINE HCL) 1 once daily;  #30 x 6;  Signed;  Entered by: Raechel Ache, RN;  Authorized by: Gordy Savers  MD;  Method used: Historical    Prescriptions: ZOLOFT 100 MG  TABS (SERTRALINE HCL) 1 once daily  #30 x 6   Entered by:   Raechel Ache, RN   Authorized by:   Gordy Savers  MD   Signed by:   Raechel Ache, RN on 08/19/2007   Method used:   Historical   RxID:   [id number]

## 2011-01-27 NOTE — Miscellaneous (Signed)
Summary: [facility]  Clinical Lists Changes  Medications: Added new medication of MOVIPREP 100 GM  SOLR (PEG-KCL-NACL-NASULF-NA ASC-C) As per prep instructions. - Signed Rx of MOVIPREP 100 GM  SOLR (PEG-KCL-NACL-NASULF-NA ASC-C) As per prep instructions.;  #1 x 0;  Signed;  Entered by: Ezra Sites RN;  Authorized by: Iva Boop MD, [facility];  Method used: [phone] [address], [place], [place], [place]  [address], Ph: [phone] or [phone], Fax: [phone] Observations: Added new observation of NKA: T (09/11/2010 13:02)    Prescriptions: MOVIPREP 100 GM  SOLR (PEG-KCL-NACL-NASULF-NA ASC-C) As per prep instructions.  #1 x 0   Entered by:   Ezra Sites RN   Authorized by:   Iva Boop MD, [facility]   Signed by:   Ezra Sites RN on 09/11/2010   Method used:   [facility]ronically to        [facility]  [address] [phone][facility] (retail)       [address] [place], [place]  [address]       Ph: [phone] or [phone]       Fax: [phone]   RxID:   [id number]

## 2011-01-27 NOTE — Miscellaneous (Signed)
  Clinical Lists Changes  Medications: Added new medication of MIRAPEX 0.25 MG  TABS (PRAMIPEXOLE DIHYDROCHLORIDE) 1 at bedtime Added new medication of ZOLOFT 50 MG  TABS (SERTRALINE HCL) 1 once daily Observations: Added new observation of NKA: T (07/26/2007 10:17)

## 2011-01-27 NOTE — Progress Notes (Signed)
Summary: refill hydroxyzine  Phone Note Refill Request Message from:  Fax from Pharmacy on November 28, 2010 2:54 PM  Refills Requested: Medication #1:  HYDROXYZINE HCL 50 MG TABS one every 6 hours as needed for itching. [facility]   Method Requested: Fax to Local Pharmacy Initial call taken by: Duard Brady LPN,  November 28, 2010 2:55 PM    Prescriptions: HYDROXYZINE HCL 50 MG TABS (HYDROXYZINE HCL) one every 6 hours as needed for itching  #60 x 0   Entered by:   Duard Brady LPN   Authorized by:   Gordy Savers  MD   Signed by:   Duard Brady LPN on 60/45/4098   Method used:   [facility]ronically [facility]        [facility]  [address] [phone][facility]       [address] [place], [place]  [address]       Ph: [phone] or [phone]       Fax: [phone]   RxID:   [id number]

## 2011-01-27 NOTE — Letter (Signed)
Summary: Patient Notice- Polyp Results  [facility]  [address] [place], [place] [address]   Phone: [phone]  Fax: [phone]        October 02, 2010 MRN: [id number]    ABRISH ERNY [address] [place], [place]  [address]    Dear Ms. Bekker,  The polyps removed from your colon were adenomatous. This means that they were pre-cancerous or that  they had the potential to change into cancer over time.  I recommend that you have a repeat colonoscopy in 5 years to determine if you have developed any new polyps over time and screen for colorectal cancer. If you develop any new rectal bleeding, abdominal pain or significant bowel habit changes, please contact us before then.  Please call us if you are having persistent problems or have questions about your condition that have not been fully answered at this time.  Sincerely,  Iva Boop MD, [facility]  This letter has been electronically signed by your physician.  Appended Document: Patient Notice- Polyp Results letter mailed 10.7.11

## 2011-02-04 ENCOUNTER — Encounter: Payer: Self-pay | Admitting: Internal Medicine

## 2011-02-05 ENCOUNTER — Encounter: Payer: Self-pay | Admitting: Internal Medicine

## 2011-02-05 ENCOUNTER — Ambulatory Visit (INDEPENDENT_AMBULATORY_CARE_PROVIDER_SITE_OTHER): Payer: Medicare Other | Admitting: Internal Medicine

## 2011-02-05 DIAGNOSIS — G2581 Restless legs syndrome: Secondary | ICD-10-CM

## 2011-02-05 MED ORDER — CLONAZEPAM 0.5 MG PO TABS: 0.5000 mg | ORAL_TABLET | Freq: Every evening | ORAL | Status: DC | PRN

## 2011-02-05 NOTE — Patient Instructions (Signed)
Call or return to clinic prn if these symptoms worsen or fail to improve as anticipated.  Schedule annual physical in 3 months

## 2011-02-05 NOTE — Progress Notes (Signed)
  Subjective:    Patient ID: Grace Schmitt, female    DOB: 09/19/1946, 65 y.o.   MRN: [id number]  HPI  65 year old patient who has a long history of fibromyalgia as well as restless leg syndrome.  Her chief complaint today is worsening symptoms of her RLS.  She has been on Requip in the past and was switched to Mirapex and earlier had a fairly dramatic response.  She has had on a 1 mg nightly dose.  She has been on lyrica, Neurontin, Zoloft, and Flexeril  Thin, the past either for fibromyalgia, or her RLS.  She has had a very difficult time going to sleep and has been quite uncomfortable.  She takes her medication at 6 p.m. Because of worsening symptoms and tries to be in bed by 8 p.m. Review of Systems  Constitutional: Negative.   HENT: Negative for hearing loss, congestion, sore throat, rhinorrhea, dental problem, sinus pressure and tinnitus.   Eyes: Negative for pain, discharge and visual disturbance.  Respiratory: Negative for cough and shortness of breath.   Cardiovascular: Negative for chest pain, palpitations and leg swelling.  Gastrointestinal: Negative for nausea, vomiting, abdominal pain, diarrhea, constipation, blood in stool and abdominal distention.  Genitourinary: Negative for dysuria, urgency, frequency, hematuria, flank pain, vaginal bleeding, vaginal discharge, difficulty urinating, vaginal pain and pelvic pain.  Musculoskeletal: Negative for joint swelling, arthralgias and gait problem.  Skin: Negative for rash.  Neurological: Negative for dizziness, syncope, speech difficulty, weakness, numbness and headaches.  Hematological: Negative for adenopathy. Does not bruise/bleed easily.  Psychiatric/Behavioral: Negative for behavioral problems, dysphoric mood and agitation. The patient is not nervous/anxious.        Objective:   Physical Exam  Constitutional: She is oriented to person, place, and time. She appears well-developed and well-nourished.  HENT:  Head: Normocephalic.    Right Ear: External ear normal.  Left Ear: External ear normal.  Mouth/Throat: Oropharynx is clear and moist.  Eyes: Conjunctivae and EOM are normal. Pupils are equal, round, and reactive to light.  Neck: Normal range of motion. Neck supple. No thyromegaly present.  Cardiovascular: Normal rate, regular rhythm, normal heart sounds and intact distal pulses.   Pulmonary/Chest: Effort normal and breath sounds normal.  Abdominal: Soft. Bowel sounds are normal. She exhibits no mass. There is no tenderness.  Musculoskeletal: Normal range of motion.  Lymphadenopathy:    She has no cervical adenopathy.  Neurological: She is alert and oriented to person, place, and time.  Skin: Skin is warm and dry. No rash noted.  Psychiatric: She has a normal mood and affect. Her behavior is normal.          Assessment & Plan:  Restless leg syndrome-  Will continue the Mirapex, which has been helpful in the past and had Klonopin 0.5 mg at bedtime.  Will consider neurologic referral depending on her clinical response.  She seems quite uncomfortable.  Will schedule for a physical in the near future.  She has had no prior history of iron deficiency anemia or thyroid disorder.  Laboratory studies from 11 months ago were unremarkable

## 2011-02-09 ENCOUNTER — Other Ambulatory Visit: Payer: Self-pay | Admitting: Internal Medicine

## 2011-03-20 ENCOUNTER — Encounter: Payer: Self-pay | Admitting: Internal Medicine

## 2011-03-20 ENCOUNTER — Ambulatory Visit (INDEPENDENT_AMBULATORY_CARE_PROVIDER_SITE_OTHER): Payer: Medicare Other | Admitting: Internal Medicine

## 2011-03-20 VITALS — BP 118/70 | Temp 98.6°F | Wt 165.0 lb

## 2011-03-20 DIAGNOSIS — G2581 Restless legs syndrome: Secondary | ICD-10-CM

## 2011-03-20 MED ORDER — TRAMADOL HCL 50 MG PO TABS: 50.0000 mg | ORAL_TABLET | Freq: Four times a day (QID) | ORAL | Status: DC | PRN

## 2011-03-20 MED ORDER — GABAPENTIN 300 MG PO CAPS: 300.0000 mg | ORAL_CAPSULE | Freq: Two times a day (BID) | ORAL | Status: DC

## 2011-03-20 MED ORDER — PRAMIPEXOLE DIHYDROCHLORIDE 1 MG PO TABS: ORAL_TABLET | ORAL | Status: DC

## 2011-03-20 NOTE — Progress Notes (Signed)
  Subjective:    Patient ID: Grace Schmitt, female    DOB: 04-Jun-1946, 65 y.o.   MRN: [id number]  HPI     65 year old patient who has a long history of restless leg syndrome.   This has intensified recently and is still quite problematic in spite of an increase in dose of Mirapex to 2 mg daily in divided dosages. Symptoms began at approximately 5 or 6 in the afternoon. Her only relief is going to bed which obviously affects the quality of her life. At the present time she is taken 0.5 of Mirapex at 6 PM and 1.5 mg at bedtime. She seems to tolerate this dose without somnolence but does retire early because this is her only avenue of relief. She does have a history of colonic polyps and did have a colonoscopy last year. She was given a follows clonidine 0.5 mg but this caused  too much somnolence.    Review of Systems  Constitutional: Negative.   HENT: Negative for hearing loss, congestion, sore throat, rhinorrhea, dental problem, sinus pressure and tinnitus.   Eyes: Negative for pain, discharge and visual disturbance.  Respiratory: Negative for cough and shortness of breath.   Cardiovascular: Negative for chest pain, palpitations and leg swelling.  Gastrointestinal: Negative for nausea, vomiting, abdominal pain, diarrhea, constipation, blood in stool and abdominal distention.  Genitourinary: Negative for dysuria, urgency, frequency, hematuria, flank pain, vaginal bleeding, vaginal discharge, difficulty urinating, vaginal pain and pelvic pain.  Musculoskeletal: Negative for joint swelling, arthralgias and gait problem.  Skin: Negative for rash.  Neurological: Negative for dizziness, syncope, speech difficulty, weakness, numbness and headaches.  Hematological: Negative for adenopathy.  Psychiatric/Behavioral: Positive for sleep disturbance. Negative for behavioral problems, dysphoric mood and agitation. The patient is hyperactive. The patient is not nervous/anxious.        Objective:   Physical Exam  Constitutional: She is oriented to person, place, and time. She appears well-developed and well-nourished.  HENT:  Head: Normocephalic.  Right Ear: External ear normal.  Left Ear: External ear normal.  Mouth/Throat: Oropharynx is clear and moist.  Eyes: Conjunctivae and EOM are normal. Pupils are equal, round, and reactive to light.  Neck: Normal range of motion. Neck supple. No thyromegaly present.  Cardiovascular: Normal rate, regular rhythm, normal heart sounds and intact distal pulses.   Pulmonary/Chest: Effort normal and breath sounds normal.  Abdominal: Soft. Bowel sounds are normal. She exhibits no mass. There is no tenderness.  Musculoskeletal: Normal range of motion.  Lymphadenopathy:    She has no cervical adenopathy.  Neurological: She is alert and oriented to person, place, and time.  Skin: Skin is warm and dry. No rash noted.  Psychiatric: She has a normal mood and affect. Her behavior is normal.          Assessment & Plan:   refractory restless leg syndrome. We'll continue Mirapex 0.5 mg at 6 PM and 1.5 mg at bedtime. We'll add Neurontin also twice a day. We'll empirically place on oral iron.   Will also give a prescription for tramadol 50 mg twice a day if symptoms persist. Nonpharmacologic measures such as discontinuation of caffeine and stretching exercises discussed.

## 2011-03-20 NOTE — Patient Instructions (Signed)
Avoid caffeine products  Take an iron supplement daily  Perform stretching exercises   Continue Mirapex 1/2 mg at 6 PM and 1.5 mg at bedtime  Trial Neurontin twice daily   if Neurontin noneffective trial tramadol  Also twice daily  Neurology followup as discussed

## 2011-04-21 ENCOUNTER — Other Ambulatory Visit: Payer: Self-pay | Admitting: Internal Medicine

## 2011-04-21 ENCOUNTER — Other Ambulatory Visit: Payer: Medicare Other

## 2011-04-21 DIAGNOSIS — Z0000 Encounter for general adult medical examination without abnormal findings: Secondary | ICD-10-CM

## 2011-04-27 ENCOUNTER — Other Ambulatory Visit (INDEPENDENT_AMBULATORY_CARE_PROVIDER_SITE_OTHER): Payer: Medicare Other

## 2011-04-27 ENCOUNTER — Other Ambulatory Visit (INDEPENDENT_AMBULATORY_CARE_PROVIDER_SITE_OTHER): Payer: Medicare Other | Admitting: Internal Medicine

## 2011-04-27 DIAGNOSIS — E785 Hyperlipidemia, unspecified: Secondary | ICD-10-CM

## 2011-04-27 DIAGNOSIS — Z0000 Encounter for general adult medical examination without abnormal findings: Secondary | ICD-10-CM

## 2011-04-27 LAB — CBC WITH DIFFERENTIAL/PLATELET
Basophils Absolute: 0.1 10*3/uL (ref 0.0–0.1)
Basophils Relative: 1.3 % (ref 0.0–3.0)
Eosinophils Absolute: 0.2 10*3/uL (ref 0.0–0.7)
Eosinophils Relative: 3.1 % (ref 0.0–5.0)
HCT: 38.4 % (ref 36.0–46.0)
Hemoglobin: 13.4 g/dL (ref 12.0–15.0)
Lymphocytes Relative: 26.5 % (ref 12.0–46.0)
Lymphs Abs: 1.7 10*3/uL (ref 0.7–4.0)
MCHC: 34.9 g/dL (ref 30.0–36.0)
MCV: 92.5 fl (ref 78.0–100.0)
Monocytes Absolute: 0.5 10*3/uL (ref 0.1–1.0)
Monocytes Relative: 8.5 % (ref 3.0–12.0)
Neutro Abs: 3.9 10*3/uL (ref 1.4–7.7)
Neutrophils Relative %: 60.6 % (ref 43.0–77.0)
Platelets: 246 10*3/uL (ref 150.0–400.0)
RBC: 4.15 Mil/uL (ref 3.87–5.11)
RDW: 13.2 % (ref 11.5–14.6)
WBC: 6.4 10*3/uL (ref 4.5–10.5)

## 2011-04-27 LAB — BASIC METABOLIC PANEL
BUN: 12 mg/dL (ref 6–23)
CO2: 27 mEq/L (ref 19–32)
Calcium: 8.7 mg/dL (ref 8.4–10.5)
Chloride: 99 mEq/L (ref 96–112)
Creatinine, Ser: 0.5 mg/dL (ref 0.4–1.2)
GFR: 131.22 mL/min (ref >60.00)
Glucose, Bld: 79 mg/dL (ref 70–99)
Potassium: 4.2 mEq/L (ref 3.5–5.1)
Sodium: 134 mEq/L — ABNORMAL LOW (ref 135–145)

## 2011-04-27 LAB — LIPID PANEL
Cholesterol: 201 mg/dL — ABNORMAL HIGH (ref 0–200)
HDL: 76.9 mg/dL (ref >39.00)
Total CHOL/HDL Ratio: 3
Triglycerides: 79 mg/dL (ref 0.0–149.0)
VLDL: 15.8 mg/dL (ref 0.0–40.0)

## 2011-04-27 LAB — HEPATIC FUNCTION PANEL
ALT: 18 U/L (ref 0–35)
AST: 21 U/L (ref 0–37)
Albumin: 3.6 g/dL (ref 3.5–5.2)
Alkaline Phosphatase: 78 U/L (ref 39–117)
Bilirubin, Direct: 0 mg/dL (ref 0.0–0.3)
Total Bilirubin: 0.7 mg/dL (ref 0.3–1.2)
Total Protein: 6.4 g/dL (ref 6.0–8.3)

## 2011-04-27 LAB — URINALYSIS
Bilirubin Urine: NEGATIVE
Hgb urine dipstick: NEGATIVE
Ketones, ur: NEGATIVE
Leukocytes, UA: NEGATIVE
Nitrite: NEGATIVE
Specific Gravity, Urine: 1.01 (ref 1.000–1.030)
Total Protein, Urine: NEGATIVE
Urine Glucose: NEGATIVE
Urobilinogen, UA: 0.2 (ref 0.0–1.0)
pH: 8 (ref 5.0–8.0)

## 2011-04-27 LAB — TSH: TSH: 1.86 u[IU]/mL (ref 0.35–5.50)

## 2011-04-27 LAB — LDL CHOLESTEROL, DIRECT: Direct LDL: 107.1 mg/dL

## 2011-04-28 ENCOUNTER — Encounter: Payer: Medicare Other | Admitting: Internal Medicine

## 2011-05-05 ENCOUNTER — Encounter: Payer: Self-pay | Admitting: Internal Medicine

## 2011-05-05 ENCOUNTER — Ambulatory Visit (INDEPENDENT_AMBULATORY_CARE_PROVIDER_SITE_OTHER): Payer: Medicare Other | Admitting: Internal Medicine

## 2011-05-05 VITALS — BP 120/70 | HR 72 | Temp 97.9°F | Resp 16 | Ht 62.0 in | Wt 173.0 lb

## 2011-05-05 DIAGNOSIS — M899 Disorder of bone, unspecified: Secondary | ICD-10-CM

## 2011-05-05 DIAGNOSIS — IMO0001 Reserved for inherently not codable concepts without codable children: Secondary | ICD-10-CM

## 2011-05-05 DIAGNOSIS — Z2911 Encounter for prophylactic immunotherapy for respiratory syncytial virus (RSV): Secondary | ICD-10-CM

## 2011-05-05 DIAGNOSIS — Z0000 Encounter for general adult medical examination without abnormal findings: Secondary | ICD-10-CM

## 2011-05-05 DIAGNOSIS — M8580 Other specified disorders of bone density and structure, unspecified site: Secondary | ICD-10-CM

## 2011-05-05 DIAGNOSIS — G2581 Restless legs syndrome: Secondary | ICD-10-CM

## 2011-05-05 DIAGNOSIS — Z23 Encounter for immunization: Secondary | ICD-10-CM

## 2011-05-05 DIAGNOSIS — Z86010 Personal history of colonic polyps: Secondary | ICD-10-CM

## 2011-05-05 MED ORDER — PRAMIPEXOLE DIHYDROCHLORIDE 1 MG PO TABS: ORAL_TABLET | ORAL | Status: DC

## 2011-05-05 MED ORDER — GABAPENTIN 300 MG PO CAPS: 300.0000 mg | ORAL_CAPSULE | Freq: Every day | ORAL | Status: DC

## 2011-05-05 MED ORDER — TRAMADOL HCL 50 MG PO TABS: 50.0000 mg | ORAL_TABLET | Freq: Four times a day (QID) | ORAL | Status: AC | PRN

## 2011-05-05 NOTE — Patient Instructions (Signed)
It is important that you exercise regularly, at least 20 minutes 3 to 4 times per week.  If you develop chest pain or shortness of breath seek  medical attention.  Take a calcium supplement, plus 800-1200 units of vitamin D  Limit your sodium (Salt) intake  Return in 6 months for follow-up  

## 2011-05-05 NOTE — Progress Notes (Signed)
Subjective:    Patient ID: Grace Schmitt, female    DOB: 21-Oct-1946, 65 y.o.   MRN: [id number]  HPI  65 year old patient who is seen today for an annual exam. She is scheduled to see neurology next week for followup of restless leg syndrome. She has a history of colonic polyps and did have followup colonoscopy in October of last year. She is scheduled for followup GYN exam with mammogram in the near future. She is doing well today.  1. Risk factors, based on past  M,S,F history  cardiovascular risk factors none denies any cardiopulmonary complaints  2.  Physical activities: Remains active with regular exercise.  3.  Depression/mood: No history of depression or mood disorder she does have a history of fibromyalgia  4.  Hearing: No deficits  5.  ADL's: Independent in all aspects of daily living  6.  Fall risk: Low  7.  Home safety: No problems identified 8.  Height weight, and visual acuity; height and weight stable. Has had some modest weight gain with Neurontin. She states that there has been a change in her height with measurements and her GYN as well as this office. History of osteopenia on a bone density in 2006 9.  Counseling: Followup bone density recommended 10. Lab orders based on risk factors: Laboratory studies discussed and reviewed  11. Referral : Follow GYN neurology. We'll get a mammogram and bone density study  12. Care plan: Continue regular exercise calcium and vitamin D supplements 13. Cognitive assessment: Alert and oriented with normal affect no cognitive dysfunction        CC: cpx - last pap 2009 normal - Dr. Marvia Pickles? and last colo 2006 polyps removed.  History of Present Illness:  65 year old patient who is seen today for a health maintenance examination. Medical problems include fibromyalgia. She also has a history of colonic polyps and restless leg syndrome. She had this quite well. She does exercise regularly 6 days per week. No concerns or complaints  today.  Preventive Screening-Counseling & Management  Alcohol-Tobacco  Smoking Status: never  Caffeine-Diet-Exercise  Does Patient Exercise: yes  Allergies (verified):  No Known Drug Allergies  Past History:  Past Medical History:  Reviewed history from 05/31/2008 and no changes required.  fibromyalgia  remote history hepatitis at age 46  gravida 3, para 3, abortus zero  history of acute rheumatic fever  Colonic polyps, hx of  restless leg syndrome  Past Surgical History:  Reviewed history from 05/31/2008 and no changes required.  tonsillectomy 1951  tubal ligation 1978  negative Cardilate stress test 2003  colonoscopy 2006  Family History:  Reviewed history from 05/31/2008 and no changes required.  father died age 4, prostate cancer  mother died age 37, dementia, secondary to small vessel disease  One brother- HTN  Social History:  Reviewed history from 05/31/2008 and no changes required.  Married  Regular exercise-yes  Smoking Status: never  Does Patient Exercise: yes   Past Medical History  Diagnosis Date  . COLONIC POLYPS, HX OF 05/31/2008  . FIBROMYALGIA 07/26/2007  . HEPATITIS, HX OF 07/26/2007  . Rheumatic fever   . Restless leg syndrome    Past Surgical History  Procedure Date  . Tonsillectomy 1951  . Tubal ligation 1978  . Colonoscopy October 2011    reports that she has never smoked. She does not have any smokeless tobacco history on file. Her alcohol and drug histories not on file. family history includes Dementia in her mother; Heart disease in her  mother; and Hypertension in her brother. No Known Allergies   Review of Systems  Constitutional: Negative for fever, appetite change, fatigue and unexpected weight change.  HENT: Negative for hearing loss, ear pain, nosebleeds, congestion, sore throat, mouth sores, trouble swallowing, neck stiffness, dental problem, voice change, sinus pressure and tinnitus.   Eyes: Negative for photophobia, pain, redness  and visual disturbance.  Respiratory: Negative for cough, chest tightness and shortness of breath.   Cardiovascular: Negative for chest pain, palpitations and leg swelling.  Gastrointestinal: Negative for nausea, vomiting, abdominal pain, diarrhea, constipation, blood in stool, abdominal distention and rectal pain.  Genitourinary: Negative for dysuria, urgency, frequency, hematuria, flank pain, vaginal bleeding, vaginal discharge, difficulty urinating, genital sores, vaginal pain, menstrual problem and pelvic pain.  Musculoskeletal: Negative for back pain and arthralgias.  Skin: Negative for rash.  Neurological: Negative for dizziness, syncope, speech difficulty, weakness, light-headedness, numbness and headaches.  Hematological: Negative for adenopathy. Does not bruise/bleed easily.  Psychiatric/Behavioral: Negative for suicidal ideas, behavioral problems, self-injury, dysphoric mood and agitation. The patient is not nervous/anxious.        Objective:   Physical Exam  Constitutional: She is oriented to person, place, and time. She appears well-developed and well-nourished.  HENT:  Head: Normocephalic and atraumatic.  Right Ear: External ear normal.  Left Ear: External ear normal.  Mouth/Throat: Oropharynx is clear and moist.  Eyes: Conjunctivae and EOM are normal.  Neck: Normal range of motion. Neck supple. No JVD present. No thyromegaly present.  Cardiovascular: Normal rate, regular rhythm, normal heart sounds and intact distal pulses.   No murmur heard. Pulmonary/Chest: Effort normal and breath sounds normal. She has no wheezes. She has no rales.  Abdominal: Soft. Bowel sounds are normal. She exhibits no distension and no mass. There is no tenderness. There is no rebound and no guarding.  Musculoskeletal: Normal range of motion. She exhibits no edema and no tenderness.  Neurological: She is alert and oriented to person, place, and time. She has normal reflexes. No cranial nerve  deficit. She exhibits normal muscle tone. Coordination normal.  Skin: Skin is warm and dry. No rash noted.  Psychiatric: She has a normal mood and affect. Her behavior is normal.          Assessment & Plan:   Unremarkable clinical examination Mild exogenous obesity. Exercise modest weight loss encouraged Osteopenia. We'll check a followup bone density. Calcium vitamin D. supplementations will be continued Restless leg syndrome. Followup neurology Fibromyalgia stable Colonic polyps

## 2011-05-19 ENCOUNTER — Ambulatory Visit (INDEPENDENT_AMBULATORY_CARE_PROVIDER_SITE_OTHER)
Admission: RE | Admit: 2011-05-19 | Discharge: 2011-05-19 | Disposition: A | Discharge status: Home / Self Care | Payer: Medicare Other | Source: Ambulatory Visit

## 2011-05-19 DIAGNOSIS — M899 Disorder of bone, unspecified: Secondary | ICD-10-CM

## 2011-05-19 DIAGNOSIS — M8580 Other specified disorders of bone density and structure, unspecified site: Secondary | ICD-10-CM

## 2011-05-19 DIAGNOSIS — Z0000 Encounter for general adult medical examination without abnormal findings: Secondary | ICD-10-CM

## 2011-05-19 DIAGNOSIS — M949 Disorder of cartilage, unspecified: Secondary | ICD-10-CM

## 2011-06-05 ENCOUNTER — Encounter: Payer: Self-pay | Admitting: Internal Medicine

## 2011-09-02 ENCOUNTER — Other Ambulatory Visit: Payer: Self-pay | Admitting: Internal Medicine

## 2011-12-30 ENCOUNTER — Telehealth: Payer: Self-pay | Admitting: Internal Medicine

## 2011-12-30 MED ORDER — PRAMIPEXOLE DIHYDROCHLORIDE 1 MG PO TABS: ORAL_TABLET | ORAL | Status: DC

## 2011-12-30 NOTE — Telephone Encounter (Signed)
When pt was in in May for physical her rx for pramipexole (MIRAPEX) 1 MG tablet was changed but pt never had a new script sent to pharmacy. Pt requesting an updated script for pramipexole (MIRAPEX) 1 MG tablet Be sent to  [id number] - [place], [place] - [address]

## 2011-12-30 NOTE — Telephone Encounter (Signed)
done

## 2012-06-20 ENCOUNTER — Other Ambulatory Visit: Payer: Self-pay | Admitting: Internal Medicine

## 2012-07-20 ENCOUNTER — Other Ambulatory Visit: Payer: Self-pay | Admitting: Internal Medicine

## 2012-08-14 ENCOUNTER — Other Ambulatory Visit: Payer: Self-pay | Admitting: Internal Medicine

## 2012-08-30 ENCOUNTER — Encounter: Payer: Self-pay | Admitting: Internal Medicine

## 2012-08-30 ENCOUNTER — Ambulatory Visit (INDEPENDENT_AMBULATORY_CARE_PROVIDER_SITE_OTHER)
Admission: RE | Admit: 2012-08-30 | Discharge: 2012-08-30 | Disposition: A | Discharge status: Home / Self Care | Payer: Medicare Other | Source: Ambulatory Visit | Attending: Internal Medicine | Admitting: Internal Medicine

## 2012-08-30 ENCOUNTER — Ambulatory Visit (INDEPENDENT_AMBULATORY_CARE_PROVIDER_SITE_OTHER): Payer: Medicare Other | Admitting: Internal Medicine

## 2012-08-30 VITALS — BP 120/80 | Temp 98.0°F | Wt 175.0 lb

## 2012-08-30 DIAGNOSIS — G2581 Restless legs syndrome: Secondary | ICD-10-CM

## 2012-08-30 DIAGNOSIS — S60229A Contusion of unspecified hand, initial encounter: Secondary | ICD-10-CM

## 2012-08-30 DIAGNOSIS — IMO0001 Reserved for inherently not codable concepts without codable children: Secondary | ICD-10-CM

## 2012-08-30 DIAGNOSIS — S60221A Contusion of right hand, initial encounter: Secondary | ICD-10-CM

## 2012-08-30 MED ORDER — PRAMIPEXOLE DIHYDROCHLORIDE 1 MG PO TABS: 1.0000 mg | ORAL_TABLET | Freq: Two times a day (BID) | ORAL | Status: DC

## 2012-08-30 MED ORDER — FLUCONAZOLE 150 MG PO TABS: 150.0000 mg | ORAL_TABLET | Freq: Once | ORAL | Status: AC

## 2012-08-30 MED ORDER — GABAPENTIN 300 MG PO CAPS: 300.0000 mg | ORAL_CAPSULE | Freq: Every day | ORAL | Status: DC

## 2012-08-30 NOTE — Progress Notes (Signed)
Subjective:    Patient ID: Grace Schmitt, female    DOB: Sep 07, 1946, 66 y.o.   MRN: [id number]  HPI  66 year old patient who is seen today for followup. She has not been seen in over one year and this here today basically for an update on her medications. She has a history of fibromyalgia as well as restless leg syndrome she has remote history of hepatitis and also a history of colonic polyps. She has osteopenia. 8 days ago she sustained trauma to her right hand and has had persistent pain and swelling.  Past Medical History  Diagnosis Date  . COLONIC POLYPS, HX OF 05/31/2008  . FIBROMYALGIA 07/26/2007  . HEPATITIS, HX OF 07/26/2007  . Rheumatic fever   . Restless leg syndrome     History   Social History  . Marital Status: Married    Spouse Name: N/A    Number of Children: N/A  . Years of Education: N/A   Occupational History  . Not on file.   Social History Main Topics  . Smoking status: Never Smoker   . Smokeless tobacco: Not on file  . Alcohol Use: Not on file  . Drug Use: Not on file  . Sexually Active: Not on file   Other Topics Concern  . Not on file   Social History Narrative  . No narrative on file    Past Surgical History  Procedure Date  . Tonsillectomy 1951  . Tubal ligation 1978  . Colonoscopy October 2011    Family History  Problem Relation Age of Onset  . Dementia Mother   . Heart disease Mother     smal vessel disease  . Hypertension Brother     No Known Allergies  Current Outpatient Prescriptions on File Prior to Visit  Medication Sig Dispense Refill  . aspirin 81 MG tablet Take 81 mg by mouth daily.        . Azelaic Acid (FINACEA) 15 % cream Apply topically 2 (two) times daily. After skin is thoroughly washed and patted dry, gently but thoroughly massage a thin film of azelaic acid cream into the affected area twice daily, in the morning and evening.       . Calcium Carbonate-Vitamin D (CALCIUM 600+D) 600-400 MG-UNIT per tablet Take 2  tablets by mouth 2 (two) times daily.        Marland Kitchen co-enzyme Q-10 (CO Q 10) 30 MG capsule Take 60 mg by mouth daily.        . Glucosamine-Chondroit-Vit C-Mn (GLUCOSAMINE-CHONDROITIN) CAPS Take 1 capsule by mouth daily.        . hydrOXYzine (ATARAX) 50 MG tablet Take 50 mg by mouth every 6 (six) hours as needed.        . loratadine (CLARITIN) 10 MG tablet Take 10 mg by mouth daily.        . Multiple Vitamin (MULTIVITAMIN) tablet Take 1 tablet by mouth daily.        . naproxen sodium (ANAPROX) 220 MG tablet Take 220 mg by mouth 2 (two) times daily with a meal.       . pramipexole (MIRAPEX) 1 MG tablet TAKE 1/2 A TABLET BY MOUTH AT 6 PM AND 1-1/2 TABLET AT BEDTIME  30 tablet  0  . ranitidine (ZANTAC) 150 MG tablet Take 150 mg by mouth 2 (two) times daily.        Marland Kitchen DISCONTD: gabapentin (NEURONTIN) 300 MG capsule Take 1 capsule (300 mg total) by mouth daily.  30 capsule  11  BP 120/80  Temp 98 F (36.7 C) (Oral)  Wt 175 lb (79.379 kg)      Review of Systems  Constitutional: Negative.   HENT: Negative for hearing loss, congestion, sore throat, rhinorrhea, dental problem, sinus pressure and tinnitus.   Eyes: Negative for pain, discharge and visual disturbance.  Respiratory: Negative for cough and shortness of breath.   Cardiovascular: Negative for chest pain, palpitations and leg swelling.  Gastrointestinal: Negative for nausea, vomiting, abdominal pain, diarrhea, constipation, blood in stool and abdominal distention.  Genitourinary: Positive for vaginal discharge. Negative for dysuria, urgency, frequency, hematuria, flank pain, vaginal bleeding, difficulty urinating, vaginal pain and pelvic pain.  Musculoskeletal: Positive for joint swelling. Negative for arthralgias and gait problem.  Skin: Negative for rash.  Neurological: Negative for dizziness, syncope, speech difficulty, weakness, numbness and headaches.  Hematological: Negative for adenopathy.  Psychiatric/Behavioral: Negative for  behavioral problems, dysphoric mood and agitation. The patient is not nervous/anxious.        Objective:   Physical Exam  Constitutional: She is oriented to person, place, and time. She appears well-developed and well-nourished.  HENT:  Head: Normocephalic.  Right Ear: External ear normal.  Left Ear: External ear normal.  Mouth/Throat: Oropharynx is clear and moist.  Eyes: Conjunctivae and EOM are normal. Pupils are equal, round, and reactive to light.  Neck: Normal range of motion. Neck supple. No thyromegaly present.  Cardiovascular: Normal rate, regular rhythm, normal heart sounds and intact distal pulses.   Pulmonary/Chest: Effort normal and breath sounds normal.  Abdominal: Soft. Bowel sounds are normal. She exhibits no mass. There is no tenderness.  Musculoskeletal: Normal range of motion.       Soft tissue swelling about the ulnar side of the hand with tenderness over the fifth metacarpal  Lymphadenopathy:    She has no cervical adenopathy.  Neurological: She is alert and oriented to person, place, and time.  Skin: Skin is warm and dry. No rash noted.  Psychiatric: She has a normal mood and affect. Her behavior is normal.          Assessment & Plan:   RLS- will RF meds Fibromyalgia.  Patient is requesting a prescription for Neurontin. She has used this in the past with benefit Contusion right hand. Likely fracture of one or more metacarpals. We'll check a x-ray History of colonic polyps  CPX 6 months  History of vaginal discharge refractory to Monistat. She is requesting a prescription for Diflucan

## 2012-08-30 NOTE — Progress Notes (Signed)
Quick Note:  Spoke with pt- informed of results ______ 

## 2012-08-30 NOTE — Patient Instructions (Signed)
  X-ray right hand as discussed  Call or return to clinic prn if these symptoms worsen or fail to improve as anticipated.

## 2013-05-05 ENCOUNTER — Other Ambulatory Visit: Payer: Self-pay | Admitting: Internal Medicine

## 2013-11-15 ENCOUNTER — Other Ambulatory Visit: Payer: Self-pay

## 2013-11-15 DIAGNOSIS — Z1231 Encounter for screening mammogram for malignant neoplasm of breast: Secondary | ICD-10-CM

## 2013-12-19 ENCOUNTER — Ambulatory Visit: Payer: Medicare Other

## 2014-05-18 ENCOUNTER — Encounter: Payer: Self-pay | Admitting: Internal Medicine

## 2014-05-18 ENCOUNTER — Ambulatory Visit (INDEPENDENT_AMBULATORY_CARE_PROVIDER_SITE_OTHER): Payer: Medicare Other | Admitting: Internal Medicine

## 2014-05-18 VITALS — BP 120/84 | Temp 97.9°F | Wt 192.0 lb

## 2014-05-18 DIAGNOSIS — G2581 Restless legs syndrome: Secondary | ICD-10-CM

## 2014-05-18 DIAGNOSIS — E039 Hypothyroidism, unspecified: Secondary | ICD-10-CM

## 2014-05-18 DIAGNOSIS — Z86010 Personal history of colonic polyps: Secondary | ICD-10-CM

## 2014-05-18 DIAGNOSIS — IMO0001 Reserved for inherently not codable concepts without codable children: Secondary | ICD-10-CM

## 2014-05-18 MED ORDER — LEVOTHYROXINE SODIUM 100 MCG PO TABS: 100.0000 ug | ORAL_TABLET | Freq: Every day | ORAL | Status: DC

## 2014-05-18 NOTE — Progress Notes (Signed)
Subjective:    Patient ID: Grace Schmitt, female    DOB: 1946/03/04, 68 y.o.   MRN: [id number]  HPI  68 year old patient who has a history of fibromyalgia and has been followed at [facility].  More recently.  Laboratory screen recently revealed an elevated TSH of 48.5 3 and a free T4 of less than 0 .2 5. For weeks ,She has had increasing fatigue, cold, clammy skin and general sense of unwellness. Mother required supplemental Synthroid  Past Medical History  Diagnosis Date  . COLONIC POLYPS, HX OF 05/31/2008  . FIBROMYALGIA 07/26/2007  . HEPATITIS, HX OF 07/26/2007  . Rheumatic fever   . Restless leg syndrome     History   Social History  . Marital Status: Married    Spouse Name: N/A    Number of Children: N/A  . Years of Education: N/A   Occupational History  . Not on file.   Social History Main Topics  . Smoking status: Never Smoker   . Smokeless tobacco: Not on file  . Alcohol Use: Not on file  . Drug Use: Not on file  . Sexual Activity: Not on file   Other Topics Concern  . Not on file   Social History Narrative  . No narrative on file    Past Surgical History  Procedure Laterality Date  . Tonsillectomy  1951  . Tubal ligation  1978  . Colonoscopy  October 2011    Family History  Problem Relation Age of Onset  . Dementia Mother   . Heart disease Mother     smal vessel disease  . Hypertension Brother     No Known Allergies  Current Outpatient Prescriptions on File Prior to Visit  Medication Sig Dispense Refill  . aspirin 81 MG tablet Take 81 mg by mouth daily.        . Azelaic Acid (FINACEA) 15 % cream Apply topically 2 (two) times daily. After skin is thoroughly washed and patted dry, gently but thoroughly massage a thin film of azelaic acid cream into the affected area twice daily, in the morning and evening.       . Calcium Carbonate-Vitamin D (CALCIUM 600+D) 600-400 MG-UNIT per tablet Take 2 tablets by mouth 2 (two) times daily.        Marland Kitchen co-enzyme Q-10  (CO Q 10) 30 MG capsule Take 60 mg by mouth daily.        Marland Kitchen gabapentin (NEURONTIN) 300 MG capsule Take 1 capsule (300 mg total) by mouth daily.  90 capsule  5  . Glucosamine-Chondroit-Vit C-Mn (GLUCOSAMINE-CHONDROITIN) CAPS Take 1 capsule by mouth daily.        . hydrOXYzine (ATARAX) 50 MG tablet Take 50 mg by mouth every 6 (six) hours as needed.        . loratadine (CLARITIN) 10 MG tablet Take 10 mg by mouth daily.        . Multiple Vitamin (MULTIVITAMIN) tablet Take 1 tablet by mouth daily.        . naproxen sodium (ANAPROX) 220 MG tablet Take 220 mg by mouth 2 (two) times daily with a meal.       . ranitidine (ZANTAC) 150 MG tablet Take 150 mg by mouth 2 (two) times daily.        . traMADol (ULTRAM) 50 MG tablet TAKE 1 TABLET BY MOUTH EVERY 6 HOURS AS NEEDED FOR PAIN  60 tablet  2   No current facility-administered medications on file prior to visit.  BP 120/84  Temp(Src) 97.9 F (36.6 C) (Oral)  Wt 192 lb (87.091 kg)       Review of Systems  Constitutional: Positive for activity change, appetite change, fatigue and unexpected weight change.  HENT: Negative for congestion, dental problem, hearing loss, rhinorrhea, sinus pressure, sore throat and tinnitus.   Eyes: Negative for pain, discharge and visual disturbance.  Respiratory: Negative for cough and shortness of breath.   Cardiovascular: Negative for chest pain, palpitations and leg swelling.  Gastrointestinal: Negative for nausea, vomiting, abdominal pain, diarrhea, constipation, blood in stool and abdominal distention.  Genitourinary: Negative for dysuria, urgency, frequency, hematuria, flank pain, vaginal bleeding, vaginal discharge, difficulty urinating, vaginal pain and pelvic pain.  Musculoskeletal: Negative for arthralgias, gait problem and joint swelling.  Skin: Negative for rash.  Neurological: Positive for weakness. Negative for dizziness, syncope, speech difficulty, numbness and headaches.  Hematological: Negative  for adenopathy.  Psychiatric/Behavioral: Negative for behavioral problems, dysphoric mood and agitation. The patient is not nervous/anxious.        Objective:   Physical Exam  Constitutional: She is oriented to person, place, and time. She appears well-developed and well-nourished.  HENT:  Head: Normocephalic.  Right Ear: External ear normal.  Left Ear: External ear normal.  Mouth/Throat: Oropharynx is clear and moist.  Eyes: Conjunctivae and EOM are normal. Pupils are equal, round, and reactive to light.  Neck: Normal range of motion. Neck supple. No thyromegaly present.  Cardiovascular: Normal rate, regular rhythm, normal heart sounds and intact distal pulses.   Pulmonary/Chest: Effort normal and breath sounds normal.  Abdominal: Soft. Bowel sounds are normal. She exhibits no mass. There is no tenderness.  Musculoskeletal: Normal range of motion.  Lymphadenopathy:    She has no cervical adenopathy.  Neurological: She is alert and oriented to person, place, and time.  Reflexes intact with slow relaxation phase  Skin: Skin is warm and dry. No rash noted.  Psychiatric: She has a normal mood and affect. Her behavior is normal.          Assessment & Plan:   Hypothyroidism.  Will start on Synthroid 25 mcg daily and increase by 25 mcg intervals.  Every 2 weeks until a final dose of 100 mcg.  We'll check a TSH and free T4 in 8 weeks Fibromyalgia History colonic polyps Restless leg syndrome

## 2014-05-18 NOTE — Patient Instructions (Addendum)
Synthroid 25 , for 2 weeks, then 50 for 2 weeks, then 75, for 2 weeks , then 100 mcg until followupHypothyroidism The thyroid is a large gland located in the lower front of your neck. The thyroid gland helps control metabolism. Metabolism is how your body handles food. It controls metabolism with the hormone thyroxine. When this gland is underactive (hypothyroid), it produces too little hormone.  CAUSES These include:   Absence or destruction of thyroid tissue.  Goiter due to iodine deficiency.  Goiter due to medications.  Congenital defects (since birth).  Problems with the pituitary. This causes a lack of TSH (thyroid stimulating hormone). This hormone tells the thyroid to turn out more hormone. SYMPTOMS  Lethargy (feeling as though you have no energy)  Cold intolerance  Weight gain (in spite of normal food intake)  Dry skin  Coarse hair  Menstrual irregularity (if severe, may lead to infertility)  Slowing of thought processes Cardiac problems are also caused by insufficient amounts of thyroid hormone. Hypothyroidism in the newborn is cretinism, and is an extreme form. It is important that this form be treated adequately and immediately or it will lead rapidly to retarded physical and mental development. DIAGNOSIS  To prove hypothyroidism, your caregiver may do blood tests and ultrasound tests. Sometimes the signs are hidden. It may be necessary for your caregiver to watch this illness with blood tests either before or after diagnosis and treatment. TREATMENT  Low levels of thyroid hormone are increased by using synthetic thyroid hormone. This is a safe, effective treatment. It usually takes about four weeks to gain the full effects of the medication. After you have the full effect of the medication, it will generally take another four weeks for problems to leave. Your caregiver may start you on low doses. If you have had heart problems the dose may be gradually increased. It is  generally not an emergency to get rapidly to normal. HOME CARE INSTRUCTIONS   Take your medications as your caregiver suggests. Let your caregiver know of any medications you are taking or start taking. Your caregiver will help you with dosage schedules.  As your condition improves, your dosage needs may increase. It will be necessary to have continuing blood tests as suggested by your caregiver.  Report all suspected medication side effects to your caregiver. SEEK MEDICAL CARE IF: Seek medical care if you develop:  Sweating.  Tremulousness (tremors).  Anxiety.  Rapid weight loss.  Heat intolerance.  Emotional swings.  Diarrhea.  Weakness. SEEK IMMEDIATE MEDICAL CARE IF:  You develop chest pain, an irregular heart beat (palpitations), or a rapid heart beat. MAKE SURE YOU:   Understand these instructions.  Will watch your condition.  Will get help right away if you are not doing well or get worse. Document Released: 12/14/2005 Document Revised: 03/07/2012 Document Reviewed: 08/03/2008 [facility] Patient Information 2014 [place].

## 2014-05-18 NOTE — Progress Notes (Signed)
Pre visit review using our clinic review tool, if applicable. No additional management support is needed unless otherwise documented below in the visit note. 

## 2014-06-04 ENCOUNTER — Encounter: Payer: Self-pay | Admitting: Internal Medicine

## 2014-06-04 ENCOUNTER — Ambulatory Visit (INDEPENDENT_AMBULATORY_CARE_PROVIDER_SITE_OTHER): Payer: Medicare Other | Admitting: Internal Medicine

## 2014-06-04 VITALS — BP 150/90 | HR 70 | Temp 98.4°F | Resp 20 | Ht 62.0 in | Wt 194.0 lb

## 2014-06-04 DIAGNOSIS — E039 Hypothyroidism, unspecified: Secondary | ICD-10-CM | POA: Insufficient documentation

## 2014-06-04 DIAGNOSIS — IMO0001 Reserved for inherently not codable concepts without codable children: Secondary | ICD-10-CM

## 2014-06-04 DIAGNOSIS — G2581 Restless legs syndrome: Secondary | ICD-10-CM

## 2014-06-04 LAB — SEDIMENTATION RATE: Sed Rate: 30 mm/hr — ABNORMAL HIGH (ref 0–22)

## 2014-06-04 LAB — TSH: TSH: 4.15 u[IU]/mL (ref 0.35–4.50)

## 2014-06-04 LAB — CK: Total CK: 84 U/L (ref 7–177)

## 2014-06-04 NOTE — Progress Notes (Signed)
Pre-visit discussion using our clinic review tool. No additional management support is needed unless otherwise documented below in the visit note.  

## 2014-06-04 NOTE — Progress Notes (Signed)
Subjective:    Patient ID: Grace Schmitt, female    DOB: 07/20/1946, 68 y.o.   MRN: [id number]  HPI  68 year old patient who is followed at [facility] neurolog[facility] due to severe RLS.  She also has a remote history of fibromyalgia.  One month ago.  She had some right groin discomfort lasted about one week.  Her chief complaint today is left anterior thigh pain of one-week duration.  This pain is quite bothersome often awakes her from sleep.  She does have some daytime symptoms.  During the night.  She often takes a hot tub bath due to the severity of the pain.  Medical regimen for RLS includes clonazepam and methadone.  She has a recent diagnosis of hypothyroidism and is on thyroid supplementation  Past Medical History  Diagnosis Date  . COLONIC POLYPS, HX OF 05/31/2008  . FIBROMYALGIA 07/26/2007  . HEPATITIS, HX OF 07/26/2007  . Rheumatic fever   . Restless leg syndrome     History   Social History  . Marital Status: Married    Spouse Name: N/A    Number of Children: N/A  . Years of Education: N/A   Occupational History  . Not on file.   Social History Main Topics  . Smoking status: Never Smoker   . Smokeless tobacco: Not on file  . Alcohol Use: Not on file  . Drug Use: Not on file  . Sexual Activity: Not on file   Other Topics Concern  . Not on file   Social History Narrative  . No narrative on file    Past Surgical History  Procedure Laterality Date  . Tonsillectomy  1951  . Tubal ligation  1978  . Colonoscopy  October 2011    Family History  Problem Relation Age of Onset  . Dementia Mother   . Heart disease Mother     smal vessel disease  . Hypertension Brother     No Known Allergies  Current Outpatient Prescriptions on File Prior to Visit  Medication Sig Dispense Refill  . aspirin 81 MG tablet Take 81 mg by mouth daily.        . Calcium Carbonate-Vitamin D (CALCIUM 600+D) 600-400 MG-UNIT per tablet Take 2 tablets by mouth 2 (two) times daily.        .  clonazePAM (KLONOPIN) 0.5 MG tablet Take 0.5 mg by mouth daily as needed for anxiety.      Marland Kitchen co-enzyme Q-10 (CO Q 10) 30 MG capsule Take 60 mg by mouth daily.        . Glucosamine-Chondroit-Vit C-Mn (GLUCOSAMINE-CHONDROITIN) CAPS Take 1 capsule by mouth daily.        Marland Kitchen loratadine (CLARITIN) 10 MG tablet Take 10 mg by mouth daily.        . methadone (DOLOPHINE) 5 MG tablet Take 5 mg by mouth daily.      . Multiple Vitamin (MULTIVITAMIN) tablet Take 1 tablet by mouth daily.         No current facility-administered medications on file prior to visit.    BP 150/90  Pulse 70  Temp(Src) 98.4 F (36.9 C) (Oral)  Resp 20  Ht 5\' 2"  (1.575 m)  Wt 194 lb (87.998 kg)  BMI 35.47 kg/m2  SpO2 97%       Review of Systems  Constitutional: Negative.   HENT: Negative for congestion, dental problem, hearing loss, rhinorrhea, sinus pressure, sore throat and tinnitus.   Eyes: Negative for pain, discharge and visual disturbance.  Respiratory: Negative  for cough and shortness of breath.   Cardiovascular: Negative for chest pain, palpitations and leg swelling.  Gastrointestinal: Negative for nausea, vomiting, abdominal pain, diarrhea, constipation, blood in stool and abdominal distention.  Genitourinary: Negative for dysuria, urgency, frequency, hematuria, flank pain, vaginal bleeding, vaginal discharge, difficulty urinating, vaginal pain and pelvic pain.  Musculoskeletal: Positive for myalgias. Negative for arthralgias, gait problem and joint swelling.  Skin: Negative for rash.  Neurological: Negative for dizziness, syncope, speech difficulty, weakness, numbness and headaches.  Hematological: Negative for adenopathy.  Psychiatric/Behavioral: Negative for behavioral problems, dysphoric mood and agitation. The patient is not nervous/anxious.        Objective:   Physical Exam  Constitutional: She is oriented to person, place, and time. She appears well-developed and well-nourished.  HENT:  Head:  Normocephalic.  Right Ear: External ear normal.  Left Ear: External ear normal.  Mouth/Throat: Oropharynx is clear and moist.  Eyes: Conjunctivae and EOM are normal. Pupils are equal, round, and reactive to light.  Neck: Normal range of motion. Neck supple. No thyromegaly present.  Cardiovascular: Normal rate, regular rhythm, normal heart sounds and intact distal pulses.   Pulmonary/Chest: Effort normal and breath sounds normal.  Abdominal: Soft. Bowel sounds are normal. She exhibits no mass. There is no tenderness.  Musculoskeletal: Normal range of motion.  Negative straight leg test.  Full range of motion of the left hip No tenderness involving the anterior thigh musculature Pedal pulses intact  Lymphadenopathy:    She has no cervical adenopathy.  Neurological: She is alert and oriented to person, place, and time.  Skin: Skin is warm and dry. No rash noted.  Psychiatric: She has a normal mood and affect. Her behavior is normal.          Assessment & Plan:   Left thigh pain.  Unclear etiology.  We'll check a sedimentation rate, and some muscle enzymes and observe.  Will continue with warm compresses, gentle massage and stretching;  also, will resolve when hypothyroidism, adequately treated RLS History of fibromyalgia Hypothyroidism.  We'll continue supplementation.  Recheck TSH

## 2014-06-04 NOTE — Patient Instructions (Signed)
You  may move around, but avoid painful motions and activities.  Apply ice to the sore area for 15 to 20 minutes 3 or 4 times daily for the next two to 3 days.   Take Aleve 200 mg twice daily for pain   Call or return to clinic prn if these symptoms worsen or fail to improve as anticipated.

## 2014-06-05 ENCOUNTER — Telehealth: Payer: Self-pay | Admitting: Internal Medicine

## 2014-06-05 NOTE — Telephone Encounter (Signed)
Pt would like blood work results. Pt is still in pain

## 2014-06-05 NOTE — Telephone Encounter (Signed)
Called pt back told her Dr.K wants to see her tomorrow. Appointment scheduled for tomorrow at 10:45 AM with Dr. Raliegh Ip. Pt verbalized understanding.

## 2014-06-05 NOTE — Telephone Encounter (Signed)
Laboratory studies are all normal

## 2014-06-05 NOTE — Telephone Encounter (Signed)
Discussed pt with Dr.K he said for pt to come in tomorrow.

## 2014-06-05 NOTE — Telephone Encounter (Signed)
Spoke to pt told her labs normal, except sed rate elevated. Pt verbalized understanding and stated what I am suppose to do about the pain in my left leg it is getting worse. Asked pt if she is doing warm compresses? Pt said yes, and warm baths, taking pain medication and nothing is helping. Told pt I will see what Dr. Raliegh Ip says and call her back. Pt verbalized understanding.

## 2014-06-05 NOTE — Telephone Encounter (Signed)
Pt would like a cb about results. Pain states pain in left leg continues to be bad, pt cannot get pain to ease off. p

## 2014-06-06 ENCOUNTER — Ambulatory Visit (INDEPENDENT_AMBULATORY_CARE_PROVIDER_SITE_OTHER): Payer: Medicare Other | Admitting: Internal Medicine

## 2014-06-06 ENCOUNTER — Encounter: Payer: Self-pay | Admitting: Internal Medicine

## 2014-06-06 VITALS — BP 160/90 | HR 65 | Temp 98.0°F | Resp 20 | Ht 62.0 in | Wt 191.0 lb

## 2014-06-06 DIAGNOSIS — M79609 Pain in unspecified limb: Secondary | ICD-10-CM

## 2014-06-06 DIAGNOSIS — E039 Hypothyroidism, unspecified: Secondary | ICD-10-CM

## 2014-06-06 DIAGNOSIS — M79605 Pain in left leg: Secondary | ICD-10-CM

## 2014-06-06 DIAGNOSIS — G2581 Restless legs syndrome: Secondary | ICD-10-CM

## 2014-06-06 MED ORDER — HYDROCODONE-ACETAMINOPHEN 10-325 MG PO TABS: 1.0000 | ORAL_TABLET | Freq: Three times a day (TID) | ORAL | Status: DC | PRN

## 2014-06-06 MED ORDER — LEVOTHYROXINE SODIUM 50 MCG PO TABS: 50.0000 ug | ORAL_TABLET | Freq: Every day | ORAL | Status: DC

## 2014-06-06 NOTE — Patient Instructions (Signed)
Continue levothyroxin 0 point 0 5 mg daily

## 2014-06-06 NOTE — Progress Notes (Signed)
Subjective:    Patient ID: Grace Schmitt, female    DOB: 1946-09-02, 68 y.o.   MRN: [id number]  HPI 68 year old patient who was seen 2 days ago with a chief complaint of severe left anterior thigh pain.  She has a recent diagnosis of primary hypothyroidism with a TSH of 48 and a very low free T4.  2 days ago, a sedimentation rate was mildly elevated at 30 with normal total CPK and a normal TSH.  She has been on levothyroxin 50 mcg for about 6 days now She states the pain is quite severe.  On a scale of 10 she ranks the pain as a 10 often throughout the night.  She states that she awoke twice to the night last night to take a hot tub bath to try to alleviate the pain.  She is on a bedtime dose of methadone for restless leg syndrome throughout the day.  The pain is rated as a 6 with significant aching.  There's been no peripheral edema, local tenderness.  No real aggravating factors.  The pain is described as constant and seems to be worse at night.  Pain now radiates down to the lower leg just proximal to the left knee  Past Medical History  Diagnosis Date  . COLONIC POLYPS, HX OF 05/31/2008  . FIBROMYALGIA 07/26/2007  . HEPATITIS, HX OF 07/26/2007  . Rheumatic fever   . Restless leg syndrome     History   Social History  . Marital Status: Married    Spouse Name: N/A    Number of Children: N/A  . Years of Education: N/A   Occupational History  . Not on file.   Social History Main Topics  . Smoking status: Never Smoker   . Smokeless tobacco: Not on file  . Alcohol Use: Not on file  . Drug Use: Not on file  . Sexual Activity: Not on file   Other Topics Concern  . Not on file   Social History Narrative  . No narrative on file    Past Surgical History  Procedure Laterality Date  . Tonsillectomy  1951  . Tubal ligation  1978  . Colonoscopy  October 2011    Family History  Problem Relation Age of Onset  . Dementia Mother   . Heart disease Mother     smal vessel disease    . Hypertension Brother     No Known Allergies  Current Outpatient Prescriptions on File Prior to Visit  Medication Sig Dispense Refill  . aspirin 81 MG tablet Take 81 mg by mouth daily.        . Calcium Carbonate-Vitamin D (CALCIUM 600+D) 600-400 MG-UNIT per tablet Take 2 tablets by mouth 2 (two) times daily.        . clonazePAM (KLONOPIN) 0.5 MG tablet Take 0.5 mg by mouth daily as needed for anxiety.      Marland Kitchen co-enzyme Q-10 (CO Q 10) 30 MG capsule Take 60 mg by mouth daily.        . Glucosamine-Chondroit-Vit C-Mn (GLUCOSAMINE-CHONDROITIN) CAPS Take 1 capsule by mouth daily.        Marland Kitchen levothyroxine (SYNTHROID, LEVOTHROID) 100 MCG tablet Take 50 mcg by mouth daily.      Marland Kitchen loratadine (CLARITIN) 10 MG tablet Take 10 mg by mouth daily.        . methadone (DOLOPHINE) 5 MG tablet Take 5 mg by mouth daily.      . Multiple Vitamin (MULTIVITAMIN) tablet Take 1 tablet by  mouth daily.         No current facility-administered medications on file prior to visit.    BP 160/90  Pulse 65  Temp(Src) 98 F (36.7 C) (Oral)  Resp 20  Ht 5\' 2"  (1.575 m)  Wt 191 lb (86.637 kg)  BMI 34.93 kg/m2  SpO2 97%       Review of Systems  Constitutional: Positive for activity change. Negative for fever, chills, appetite change, fatigue and unexpected weight change.       Objective:   Physical Exam  Constitutional: She appears well-developed and well-nourished.  Cardiovascular:  Pedal pulses are faint No ischemic changes  Musculoskeletal:  Exam unchanged.  No tenderness over inflammatory changes at about the left anterior thigh or knee Range of motion left hip.  Intact          Assessment & Plan:   Left anterior thigh pain.  Unclear etiology pain is quite severe.  We'll check a lumbar MRI to exclude spinal stenosis or other radicular issue although pain is quite atypical Hypothyroidism.  Will continue on present levothyroxin dose Restless leg syndrome

## 2014-06-07 ENCOUNTER — Other Ambulatory Visit: Payer: Self-pay | Admitting: Internal Medicine

## 2014-06-07 ENCOUNTER — Ambulatory Visit
Admission: RE | Admit: 2014-06-07 | Discharge: 2014-06-07 | Disposition: A | Discharge status: Home / Self Care | Payer: Medicare Other | Source: Ambulatory Visit | Attending: Internal Medicine | Admitting: Internal Medicine

## 2014-06-07 DIAGNOSIS — M79605 Pain in left leg: Secondary | ICD-10-CM

## 2014-06-07 DIAGNOSIS — M79609 Pain in unspecified limb: Secondary | ICD-10-CM

## 2014-06-07 LAB — D-DIMER, QUANTITATIVE: D-Dimer, Quant: 0.27 ug/mL-FEU (ref 0.00–0.48)

## 2014-06-11 ENCOUNTER — Ambulatory Visit
Admission: RE | Admit: 2014-06-11 | Discharge: 2014-06-11 | Disposition: A | Discharge status: Home / Self Care | Payer: Medicare Other | Source: Ambulatory Visit | Attending: Internal Medicine | Admitting: Internal Medicine

## 2014-06-11 DIAGNOSIS — M79605 Pain in left leg: Secondary | ICD-10-CM

## 2014-06-12 ENCOUNTER — Other Ambulatory Visit: Payer: Self-pay | Admitting: Internal Medicine

## 2014-06-12 DIAGNOSIS — M79605 Pain in left leg: Secondary | ICD-10-CM

## 2014-06-13 ENCOUNTER — Ambulatory Visit: Payer: Medicare Other | Admitting: Internal Medicine

## 2014-07-18 ENCOUNTER — Ambulatory Visit (INDEPENDENT_AMBULATORY_CARE_PROVIDER_SITE_OTHER): Payer: Medicare Other | Admitting: Internal Medicine

## 2014-07-18 ENCOUNTER — Encounter: Payer: Self-pay | Admitting: Internal Medicine

## 2014-07-18 VITALS — BP 126/88 | HR 69 | Temp 97.9°F | Resp 20 | Ht 62.0 in | Wt 192.0 lb

## 2014-07-18 DIAGNOSIS — IMO0001 Reserved for inherently not codable concepts without codable children: Secondary | ICD-10-CM

## 2014-07-18 DIAGNOSIS — E039 Hypothyroidism, unspecified: Secondary | ICD-10-CM

## 2014-07-18 DIAGNOSIS — G2581 Restless legs syndrome: Secondary | ICD-10-CM

## 2014-07-18 LAB — TSH: TSH: 0.99 u[IU]/mL (ref 0.35–4.50)

## 2014-07-18 NOTE — Progress Notes (Signed)
Subjective:    Patient ID: Grace Schmitt, female    DOB: 1946/08/19, 68 y.o.   MRN: [id number]  HPI  68 year old patient who is seen today for followup of hypothyroidism.  She has been seen recently and referred to orthopedics due to severe left anterior thigh pain.  Orthopedics felt this was related to lumbar disc disease.  The patient states that she is 80% improved.  She has been forces sleep in a recliner for the past 3 weeks, but now has resumed sleeping in her bed. She has hypothyroidism and has been on supplemental levothyroxine.  Is seen today for followup. Chronic medical problems include fibromyalgia and restless leg syndrome  Past Medical History  Diagnosis Date  . COLONIC POLYPS, HX OF 05/31/2008  . FIBROMYALGIA 07/26/2007  . HEPATITIS, HX OF 07/26/2007  . Rheumatic fever   . Restless leg syndrome     History   Social History  . Marital Status: Married    Spouse Name: N/A    Number of Children: N/A  . Years of Education: N/A   Occupational History  . Not on file.   Social History Main Topics  . Smoking status: Never Smoker   . Smokeless tobacco: Not on file  . Alcohol Use: Not on file  . Drug Use: Not on file  . Sexual Activity: Not on file   Other Topics Concern  . Not on file   Social History Narrative  . No narrative on file    Past Surgical History  Procedure Laterality Date  . Tonsillectomy  1951  . Tubal ligation  1978  . Colonoscopy  October 2011    Family History  Problem Relation Age of Onset  . Dementia Mother   . Heart disease Mother     smal vessel disease  . Hypertension Brother     No Known Allergies  Current Outpatient Prescriptions on File Prior to Visit  Medication Sig Dispense Refill  . aspirin 81 MG tablet Take 81 mg by mouth daily.        . Calcium Carbonate-Vitamin D (CALCIUM 600+D) 600-400 MG-UNIT per tablet Take 2 tablets by mouth 2 (two) times daily.        . clonazePAM (KLONOPIN) 0.5 MG tablet Take 0.5 mg by mouth  daily as needed for anxiety.      Marland Kitchen co-enzyme Q-10 (CO Q 10) 30 MG capsule Take 60 mg by mouth daily.        . Glucosamine-Chondroit-Vit C-Mn (GLUCOSAMINE-CHONDROITIN) CAPS Take 1 capsule by mouth daily.        Marland Kitchen HYDROcodone-acetaminophen (NORCO) 10-325 MG per tablet Take 1 tablet by mouth every 8 (eight) hours as needed.  60 tablet  0  . levothyroxine (SYNTHROID, LEVOTHROID) 50 MCG tablet Take 1 tablet (50 mcg total) by mouth daily.  90 tablet  3  . loratadine (CLARITIN) 10 MG tablet Take 10 mg by mouth daily.        . methadone (DOLOPHINE) 5 MG tablet Take 5 mg by mouth daily.      . Multiple Vitamin (MULTIVITAMIN) tablet Take 1 tablet by mouth daily.         No current facility-administered medications on file prior to visit.    BP 126/88  Pulse 69  Temp(Src) 97.9 F (36.6 C) (Oral)  Resp 20  Ht 5\' 2"  (1.575 m)  Wt 192 lb (87.091 kg)  BMI 35.11 kg/m2  SpO2 98%     Review of Systems  Constitutional: Negative.  HENT: Negative for congestion, dental problem, hearing loss, rhinorrhea, sinus pressure, sore throat and tinnitus.   Eyes: Negative for pain, discharge and visual disturbance.  Respiratory: Negative for cough and shortness of breath.   Cardiovascular: Negative for chest pain, palpitations and leg swelling.  Gastrointestinal: Negative for nausea, vomiting, abdominal pain, diarrhea, constipation, blood in stool and abdominal distention.  Genitourinary: Negative for dysuria, urgency, frequency, hematuria, flank pain, vaginal bleeding, vaginal discharge, difficulty urinating, vaginal pain and pelvic pain.  Musculoskeletal: Positive for arthralgias and myalgias. Negative for gait problem and joint swelling.  Skin: Negative for rash.  Neurological: Negative for dizziness, syncope, speech difficulty, weakness, numbness and headaches.  Hematological: Negative for adenopathy.  Psychiatric/Behavioral: Negative for behavioral problems, dysphoric mood and agitation. The patient is  not nervous/anxious.        Objective:   Physical Exam  Constitutional: She is oriented to person, place, and time. She appears well-developed and well-nourished.  HENT:  Head: Normocephalic.  Right Ear: External ear normal.  Left Ear: External ear normal.  Mouth/Throat: Oropharynx is clear and moist.  Eyes: Conjunctivae and EOM are normal. Pupils are equal, round, and reactive to light.  Neck: Normal range of motion. Neck supple. No thyromegaly present.  Cardiovascular: Normal rate, regular rhythm, normal heart sounds and intact distal pulses.   Pedal pulses, full  Pulmonary/Chest: Effort normal and breath sounds normal.  Abdominal: Soft. Bowel sounds are normal. She exhibits no mass. There is no tenderness.  Musculoskeletal: Normal range of motion.  Negative straight leg test Full range of motion left hip No tenderness involving the left thigh musculature   Lymphadenopathy:    She has no cervical adenopathy.  Neurological: She is alert and oriented to person, place, and time.  Able to walk on toes and heels without difficulty  Skin: Skin is warm and dry. No rash noted.  Psychiatric: She has a normal mood and affect. Her behavior is normal.          Assessment & Plan:   Left anterior leg pain.  Nice clinical improvement.  We'll continue to observe.  Continue present regimen Hypothyroidism.  We'll check a followup TSH  Followup 6 months or as needed

## 2014-07-18 NOTE — Progress Notes (Signed)
Pre visit review using our clinic review tool, if applicable. No additional management support is needed unless otherwise documented below in the visit note. 

## 2014-07-18 NOTE — Patient Instructions (Signed)
Limit your sodium (Salt) intake    It is important that you exercise regularly, at least 20 minutes 3 to 4 times per week.  If you develop chest pain or shortness of breath seek  medical attention.  Return in 6 months for follow-up  

## 2014-07-23 ENCOUNTER — Encounter: Payer: Self-pay | Admitting: Internal Medicine

## 2014-07-24 NOTE — Telephone Encounter (Signed)
Spoke to pt, told her TSH result is in range but a lot less than last time. Pt verbalized understanding and stated she saw it dropped on My Chart. Told her Dr. Raliegh Ip is out of the office and will be back and Monday and I will have him review lab and see if he wants any change in medication dose. Pt verbalized understanding.

## 2014-07-24 NOTE — Telephone Encounter (Signed)
Dr. Raliegh Ip, please review and advise.

## 2014-08-01 ENCOUNTER — Encounter: Payer: Self-pay | Admitting: Internal Medicine

## 2014-09-19 ENCOUNTER — Ambulatory Visit
Admission: RE | Admit: 2014-09-19 | Discharge: 2014-09-19 | Disposition: A | Discharge status: Home / Self Care | Payer: Medicare Other | Source: Ambulatory Visit

## 2014-09-19 DIAGNOSIS — Z1231 Encounter for screening mammogram for malignant neoplasm of breast: Secondary | ICD-10-CM

## 2014-10-23 ENCOUNTER — Ambulatory Visit (INDEPENDENT_AMBULATORY_CARE_PROVIDER_SITE_OTHER): Payer: Medicare Other | Admitting: Internal Medicine

## 2014-10-23 ENCOUNTER — Encounter: Payer: Self-pay | Admitting: Internal Medicine

## 2014-10-23 VITALS — BP 126/72 | HR 74 | Temp 98.3°F | Resp 20 | Ht 62.0 in | Wt 193.0 lb

## 2014-10-23 DIAGNOSIS — IMO0001 Reserved for inherently not codable concepts without codable children: Secondary | ICD-10-CM

## 2014-10-23 DIAGNOSIS — M609 Myositis, unspecified: Secondary | ICD-10-CM

## 2014-10-23 DIAGNOSIS — M791 Myalgia: Secondary | ICD-10-CM

## 2014-10-23 DIAGNOSIS — R5382 Chronic fatigue, unspecified: Secondary | ICD-10-CM

## 2014-10-23 DIAGNOSIS — E039 Hypothyroidism, unspecified: Secondary | ICD-10-CM

## 2014-10-23 LAB — CBC WITH DIFFERENTIAL/PLATELET
Basophils Absolute: 0.1 10*3/uL (ref 0.0–0.1)
Basophils Relative: 0.8 % (ref 0.0–3.0)
Eosinophils Absolute: 0.2 10*3/uL (ref 0.0–0.7)
Eosinophils Relative: 3 % (ref 0.0–5.0)
HCT: 41.3 % (ref 36.0–46.0)
Hemoglobin: 13.6 g/dL (ref 12.0–15.0)
Lymphocytes Relative: 28.1 % (ref 12.0–46.0)
Lymphs Abs: 2.3 10*3/uL (ref 0.7–4.0)
MCHC: 33 g/dL (ref 30.0–36.0)
MCV: 92.2 fl (ref 78.0–100.0)
Monocytes Absolute: 0.6 10*3/uL (ref 0.1–1.0)
Monocytes Relative: 7.9 % (ref 3.0–12.0)
Neutro Abs: 4.8 10*3/uL (ref 1.4–7.7)
Neutrophils Relative %: 60.2 % (ref 43.0–77.0)
Platelets: 285 10*3/uL (ref 150.0–400.0)
RBC: 4.48 Mil/uL (ref 3.87–5.11)
RDW: 13 % (ref 11.5–15.5)
WBC: 8 10*3/uL (ref 4.0–10.5)

## 2014-10-23 LAB — COMPREHENSIVE METABOLIC PANEL
ALT: 20 U/L (ref 0–35)
AST: 23 U/L (ref 0–37)
Albumin: 3.7 g/dL (ref 3.5–5.2)
Alkaline Phosphatase: 82 U/L (ref 39–117)
BUN: 13 mg/dL (ref 6–23)
CO2: 28 mEq/L (ref 19–32)
Calcium: 9.9 mg/dL (ref 8.4–10.5)
Chloride: 102 mEq/L (ref 96–112)
Creatinine, Ser: 0.8 mg/dL (ref 0.4–1.2)
GFR: 78.89 mL/min (ref >60.00)
Glucose, Bld: 75 mg/dL (ref 70–99)
Potassium: 4.4 mEq/L (ref 3.5–5.1)
Sodium: 139 mEq/L (ref 135–145)
Total Bilirubin: 0.4 mg/dL (ref 0.2–1.2)
Total Protein: 7.8 g/dL (ref 6.0–8.3)

## 2014-10-23 LAB — SEDIMENTATION RATE: Sed Rate: 30 mm/hr — ABNORMAL HIGH (ref 0–22)

## 2014-10-23 LAB — TSH: TSH: 0.47 u[IU]/mL (ref 0.35–4.50)

## 2014-10-23 NOTE — Progress Notes (Signed)
Pre visit review using our clinic review tool, if applicable. No additional management support is needed unless otherwise documented below in the visit note. 

## 2014-10-23 NOTE — Progress Notes (Signed)
Subjective:    Patient ID: Grace Schmitt, female    DOB: 05/28/1946, 68 y.o.   MRN: [id number]  HPI  68 year old patient who presents with a chief complaint of extreme fatigue over the past week.  She does have a history of fibromyalgia and restless leg syndrome.  She states that she has slept poorly and awakes with the fatigue.  No significant fibromyalgia pain.  She was concerned about thyroid dysfunction and basically wanted a lab test done only, but was encouraged to have a office visit today.  She has been compliant with levothyroxine therapy.  Past Medical History  Diagnosis Date  . COLONIC POLYPS, HX OF 05/31/2008  . FIBROMYALGIA 07/26/2007  . HEPATITIS, HX OF 07/26/2007  . Rheumatic fever   . Restless leg syndrome     History   Social History  . Marital Status: Married    Spouse Name: N/A    Number of Children: N/A  . Years of Education: N/A   Occupational History  . Not on file.   Social History Main Topics  . Smoking status: Never Smoker   . Smokeless tobacco: Not on file  . Alcohol Use: Not on file  . Drug Use: Not on file  . Sexual Activity: Not on file   Other Topics Concern  . Not on file   Social History Narrative  . No narrative on file    Past Surgical History  Procedure Laterality Date  . Tonsillectomy  1951  . Tubal ligation  1978  . Colonoscopy  October 2011    Family History  Problem Relation Age of Onset  . Dementia Mother   . Heart disease Mother     smal vessel disease  . Hypertension Brother     No Known Allergies  Current Outpatient Prescriptions on File Prior to Visit  Medication Sig Dispense Refill  . aspirin 81 MG tablet Take 81 mg by mouth daily.        . Calcium Carbonate-Vitamin D (CALCIUM 600+D) 600-400 MG-UNIT per tablet Take 2 tablets by mouth 2 (two) times daily.        . clonazePAM (KLONOPIN) 0.5 MG tablet Take 0.5 mg by mouth daily as needed for anxiety.      Marland Kitchen co-enzyme Q-10 (CO Q 10) 30 MG capsule Take 60 mg by  mouth daily.        . Glucosamine-Chondroit-Vit C-Mn (GLUCOSAMINE-CHONDROITIN) CAPS Take 1 capsule by mouth daily.        Marland Kitchen levothyroxine (SYNTHROID, LEVOTHROID) 50 MCG tablet Take 1 tablet (50 mcg total) by mouth daily.  90 tablet  3  . loratadine (CLARITIN) 10 MG tablet Take 10 mg by mouth daily.        . methadone (DOLOPHINE) 5 MG tablet Take 5 mg by mouth daily.      . Multiple Vitamin (MULTIVITAMIN) tablet Take 1 tablet by mouth daily.        . naproxen sodium (ALEVE) 220 MG tablet Take 220 mg by mouth 3 (three) times daily with meals.       No current facility-administered medications on file prior to visit.    BP 126/72  Pulse 74  Temp(Src) 98.3 F (36.8 C) (Oral)  Resp 20  Ht 5\' 2"  (1.575 m)  Wt 193 lb (87.544 kg)  BMI 35.29 kg/m2  SpO2 98%     Review of Systems  Constitutional: Positive for fatigue.  HENT: Negative for congestion, dental problem, hearing loss, rhinorrhea, sinus pressure, sore throat and tinnitus.  Eyes: Negative for pain, discharge and visual disturbance.  Respiratory: Negative for cough and shortness of breath.   Cardiovascular: Negative for chest pain, palpitations and leg swelling.  Gastrointestinal: Negative for nausea, vomiting, abdominal pain, diarrhea, constipation, blood in stool and abdominal distention.  Genitourinary: Negative for dysuria, urgency, frequency, hematuria, flank pain, vaginal bleeding, vaginal discharge, difficulty urinating, vaginal pain and pelvic pain.  Musculoskeletal: Negative for arthralgias, gait problem and joint swelling.  Skin: Negative for rash.  Neurological: Positive for weakness. Negative for dizziness, syncope, speech difficulty, numbness and headaches.  Hematological: Negative for adenopathy.  Psychiatric/Behavioral: Positive for sleep disturbance. Negative for behavioral problems, dysphoric mood and agitation. The patient is not nervous/anxious.        Objective:   Physical Exam  Constitutional: She is  oriented to person, place, and time. She appears well-developed and well-nourished. No distress.  Appears well and in no acute distress Afebrile Easily transfers from a sitting position to the examining table unassisted No dyspnea on exertion  HENT:  Head: Normocephalic.  Right Ear: External ear normal.  Left Ear: External ear normal.  Mouth/Throat: Oropharynx is clear and moist.  Eyes: Conjunctivae and EOM are normal. Pupils are equal, round, and reactive to light.  Neck: Normal range of motion. Neck supple. No thyromegaly present.  Cardiovascular: Normal rate, regular rhythm, normal heart sounds and intact distal pulses.   Pulmonary/Chest: Effort normal and breath sounds normal.  Abdominal: Soft. Bowel sounds are normal. She exhibits no mass. There is no tenderness.  Musculoskeletal: Normal range of motion.  Lymphadenopathy:    She has no cervical adenopathy.  Neurological: She is alert and oriented to person, place, and time.  Skin: Skin is warm and dry. No rash noted.  Psychiatric: She has a normal mood and affect. Her behavior is normal.          Assessment & Plan:   Fatigue of one week's duration.  History of fibromyalgia.  History of hypothyroidism Insomnia  We'll check some updated lab and observe.  Clinical exam is unremarkable Will call if there is any clinical deterioration

## 2014-10-23 NOTE — Patient Instructions (Signed)
Call or return to clinic prn if these symptoms worsen or fail to improve as anticipated.

## 2014-10-25 ENCOUNTER — Encounter: Payer: Self-pay | Admitting: Internal Medicine

## 2014-11-05 ENCOUNTER — Encounter: Payer: Self-pay | Admitting: Internal Medicine

## 2014-11-05 ENCOUNTER — Ambulatory Visit (INDEPENDENT_AMBULATORY_CARE_PROVIDER_SITE_OTHER): Payer: Medicare Other | Admitting: Internal Medicine

## 2014-11-05 VITALS — BP 120/80 | HR 85 | Temp 98.4°F | Resp 20 | Ht 62.0 in | Wt 190.0 lb

## 2014-11-05 DIAGNOSIS — E039 Hypothyroidism, unspecified: Secondary | ICD-10-CM

## 2014-11-05 DIAGNOSIS — B9789 Other viral agents as the cause of diseases classified elsewhere: Secondary | ICD-10-CM

## 2014-11-05 DIAGNOSIS — J069 Acute upper respiratory infection, unspecified: Secondary | ICD-10-CM

## 2014-11-05 MED ORDER — HYDROCODONE-HOMATROPINE 5-1.5 MG/5ML PO SYRP
5.0000 mL | ORAL_SOLUTION | Freq: Four times a day (QID) | ORAL | Status: DC | PRN
Start: 2014-11-05 — End: 2015-01-22

## 2014-11-05 NOTE — Progress Notes (Signed)
Pre visit review using our clinic review tool, if applicable. No additional management support is needed unless otherwise documented below in the visit note. 

## 2014-11-05 NOTE — Patient Instructions (Signed)
Acute bronchitis symptoms for less than 10 days are generally not helped by antibiotics.  Take over-the-counter expectorants and cough medications such as  Mucinex DM.  Call if there is no improvement in 5 to 7 days or if  you develop worsening cough, fever, or new symptoms, such as shortness of breath or chest pain.   

## 2014-11-05 NOTE — Progress Notes (Signed)
Subjective:    Patient ID: Grace Schmitt, female    DOB: 10-29-1946, 68 y.o.   MRN: [id number]  HPI  68 year old patient who presents with a 3 to four-day history of sore throat, cough and fever.  Fever was as high as 101 yesterday.  Cough is minimally productive.  At the present time afebrile with a chief complaint of cough.  Denies any chest pain or shortness of breath.  Sore throat has improved.  She had a visit from her daughter recently who did have an acute illness. Stable medical problems include hypothyroidism.  Recent TSH normal  Past Medical History  Diagnosis Date  . COLONIC POLYPS, HX OF 05/31/2008  . FIBROMYALGIA 07/26/2007  . HEPATITIS, HX OF 07/26/2007  . Rheumatic fever   . Restless leg syndrome     History   Social History  . Marital Status: Married    Spouse Name: N/A    Number of Children: N/A  . Years of Education: N/A   Occupational History  . Not on file.   Social History Main Topics  . Smoking status: Never Smoker   . Smokeless tobacco: Not on file  . Alcohol Use: Not on file  . Drug Use: Not on file  . Sexual Activity: Not on file   Other Topics Concern  . Not on file   Social History Narrative    Past Surgical History  Procedure Laterality Date  . Tonsillectomy  1951  . Tubal ligation  1978  . Colonoscopy  October 2011    Family History  Problem Relation Age of Onset  . Dementia Mother   . Heart disease Mother     smal vessel disease  . Hypertension Brother     No Known Allergies  Current Outpatient Prescriptions on File Prior to Visit  Medication Sig Dispense Refill  . aspirin 81 MG tablet Take 81 mg by mouth daily.      . Calcium Carbonate-Vitamin D (CALCIUM 600+D) 600-400 MG-UNIT per tablet Take 2 tablets by mouth 2 (two) times daily.      . clonazePAM (KLONOPIN) 0.5 MG tablet Take 0.5 mg by mouth daily as needed for anxiety.    Marland Kitchen co-enzyme Q-10 (CO Q 10) 30 MG capsule Take 60 mg by mouth daily.      .  Glucosamine-Chondroit-Vit C-Mn (GLUCOSAMINE-CHONDROITIN) CAPS Take 1 capsule by mouth daily.      Marland Kitchen levothyroxine (SYNTHROID, LEVOTHROID) 50 MCG tablet Take 1 tablet (50 mcg total) by mouth daily. 90 tablet 3  . loratadine (CLARITIN) 10 MG tablet Take 10 mg by mouth daily.      . methadone (DOLOPHINE) 5 MG tablet Take 5 mg by mouth daily.    . Multiple Vitamin (MULTIVITAMIN) tablet Take 1 tablet by mouth daily.      . naproxen sodium (ALEVE) 220 MG tablet Take 220 mg by mouth as needed.      No current facility-administered medications on file prior to visit.    BP 120/80 mmHg  Pulse 85  Temp(Src) 98.4 F (36.9 C) (Oral)  Resp 20  Ht 5\' 2"  (1.575 m)  Wt 190 lb (86.183 kg)  BMI 34.74 kg/m2  SpO2 96%     Review of Systems  Constitutional: Positive for fever, activity change, appetite change and fatigue. Negative for chills and diaphoresis.  HENT: Positive for congestion, sinus pressure and sore throat. Negative for dental problem, hearing loss, rhinorrhea and tinnitus.   Eyes: Negative for pain, discharge and visual disturbance.  Respiratory:  Positive for cough. Negative for shortness of breath.   Cardiovascular: Negative for chest pain, palpitations and leg swelling.  Gastrointestinal: Negative for nausea, vomiting, abdominal pain, diarrhea, constipation, blood in stool and abdominal distention.  Genitourinary: Negative for dysuria, urgency, frequency, hematuria, flank pain, vaginal bleeding, vaginal discharge, difficulty urinating, vaginal pain and pelvic pain.  Musculoskeletal: Negative for joint swelling, arthralgias and gait problem.  Skin: Negative for rash.  Neurological: Negative for dizziness, syncope, speech difficulty, weakness, numbness and headaches.  Hematological: Negative for adenopathy.  Psychiatric/Behavioral: Negative for behavioral problems, dysphoric mood and agitation. The patient is not nervous/anxious.        Objective:   Physical Exam  Constitutional:  She is oriented to person, place, and time. She appears well-developed and well-nourished.  HENT:  Head: Normocephalic.  Right Ear: External ear normal.  Left Ear: External ear normal.  Mouth/Throat: Oropharynx is clear and moist.  Eyes: Conjunctivae and EOM are normal. Pupils are equal, round, and reactive to light.  Neck: Normal range of motion. Neck supple. No thyromegaly present.  Cardiovascular: Normal rate, regular rhythm, normal heart sounds and intact distal pulses.   Pulmonary/Chest: Effort normal and breath sounds normal. No respiratory distress. She has no wheezes. She has no rales.  Abdominal: Soft. Bowel sounds are normal. She exhibits no mass. There is no tenderness.  Musculoskeletal: Normal range of motion.  Lymphadenopathy:    She has no cervical adenopathy.  Neurological: She is alert and oriented to person, place, and time.  Skin: Skin is warm and dry. No rash noted.  Psychiatric: She has a normal mood and affect. Her behavior is normal.          Assessment & Plan:   Viral URI with cough.  Will treat symptomatically Hypothyroidism.  Continue supplemental levothyroxine

## 2015-01-18 ENCOUNTER — Ambulatory Visit: Payer: Medicare Other | Admitting: Internal Medicine

## 2015-01-22 ENCOUNTER — Ambulatory Visit (INDEPENDENT_AMBULATORY_CARE_PROVIDER_SITE_OTHER): Payer: Medicare Other | Admitting: Internal Medicine

## 2015-01-22 ENCOUNTER — Encounter: Payer: Self-pay | Admitting: Internal Medicine

## 2015-01-22 VITALS — BP 132/80 | HR 62 | Temp 98.0°F | Resp 20 | Ht 62.0 in | Wt 193.0 lb

## 2015-01-22 DIAGNOSIS — E039 Hypothyroidism, unspecified: Secondary | ICD-10-CM

## 2015-01-22 DIAGNOSIS — G2581 Restless legs syndrome: Secondary | ICD-10-CM

## 2015-01-22 DIAGNOSIS — Z86010 Personal history of colonic polyps: Secondary | ICD-10-CM

## 2015-01-22 DIAGNOSIS — M8580 Other specified disorders of bone density and structure, unspecified site: Secondary | ICD-10-CM

## 2015-01-22 NOTE — Progress Notes (Signed)
Subjective:    Patient ID: Grace Schmitt, female    DOB: 1946-09-14, 69 y.o.   MRN: [id number]  HPI  69 year old patient who is seen today for her biannual follow-up.  She has a history of restless leg syndrome and fibromyalgia.  She has been evaluated by [facility] neurology and is on a bedtime dose of methadone for her RLS.  Multiple other medications were not effective.  She also takes an occasional dose of Klonopin at bedtime.  She has hypothyroidism.  Complaints today include some chronic fatigue and some cognitive difficulties.  She states that she often gets distracted and cannot remember what tasks she had planned to perform.  She has hypothyroidism and TSH was therapeutic in October.  She is scheduled for an annual exam in May  Past Medical History  Diagnosis Date  . COLONIC POLYPS, HX OF 05/31/2008  . FIBROMYALGIA 07/26/2007  . HEPATITIS, HX OF 07/26/2007  . Rheumatic fever   . Restless leg syndrome     History   Social History  . Marital Status: Married    Spouse Name: N/A    Number of Children: N/A  . Years of Education: N/A   Occupational History  . Not on file.   Social History Main Topics  . Smoking status: Never Smoker   . Smokeless tobacco: Not on file  . Alcohol Use: Not on file  . Drug Use: Not on file  . Sexual Activity: Not on file   Other Topics Concern  . Not on file   Social History Narrative    Past Surgical History  Procedure Laterality Date  . Tonsillectomy  1951  . Tubal ligation  1978  . Colonoscopy  October 2011    Family History  Problem Relation Age of Onset  . Dementia Mother   . Heart disease Mother     smal vessel disease  . Hypertension Brother     No Known Allergies  Current Outpatient Prescriptions on File Prior to Visit  Medication Sig Dispense Refill  . aspirin 81 MG tablet Take 81 mg by mouth daily.      . Calcium Carbonate-Vitamin D (CALCIUM 600+D) 600-400 MG-UNIT per tablet Take 2 tablets by mouth 2 (two) times daily.       . clonazePAM (KLONOPIN) 0.5 MG tablet Take 0.5 mg by mouth daily as needed for anxiety.    Marland Kitchen co-enzyme Q-10 (CO Q 10) 30 MG capsule Take 60 mg by mouth daily.      . Glucosamine-Chondroit-Vit C-Mn (GLUCOSAMINE-CHONDROITIN) CAPS Take 1 capsule by mouth daily.      Marland Kitchen levothyroxine (SYNTHROID, LEVOTHROID) 50 MCG tablet Take 1 tablet (50 mcg total) by mouth daily. 90 tablet 3  . loratadine (CLARITIN) 10 MG tablet Take 10 mg by mouth daily.      . methadone (DOLOPHINE) 5 MG tablet Take 5 mg by mouth daily.    . Multiple Vitamin (MULTIVITAMIN) tablet Take 1 tablet by mouth daily.      . naproxen sodium (ALEVE) 220 MG tablet Take 220 mg by mouth as needed.      No current facility-administered medications on file prior to visit.    BP 132/80 mmHg  Pulse 62  Temp(Src) 98 F (36.7 C) (Oral)  Resp 20  Ht 5\' 2"  (1.575 m)  Wt 193 lb (87.544 kg)  BMI 35.29 kg/m2  SpO2 97%     Review of Systems  Constitutional: Positive for fatigue.  HENT: Negative for congestion, dental problem, hearing loss, rhinorrhea,  sinus pressure, sore throat and tinnitus.   Eyes: Negative for pain, discharge and visual disturbance.  Respiratory: Negative for cough and shortness of breath.   Cardiovascular: Negative for chest pain, palpitations and leg swelling.  Gastrointestinal: Negative for nausea, vomiting, abdominal pain, diarrhea, constipation, blood in stool and abdominal distention.  Genitourinary: Negative for dysuria, urgency, frequency, hematuria, flank pain, vaginal bleeding, vaginal discharge, difficulty urinating, vaginal pain and pelvic pain.  Musculoskeletal: Negative for joint swelling, arthralgias and gait problem.  Skin: Negative for rash.  Neurological: Negative for dizziness, syncope, speech difficulty, weakness, numbness and headaches.  Hematological: Negative for adenopathy.  Psychiatric/Behavioral: Positive for sleep disturbance and decreased concentration. Negative for behavioral problems,  dysphoric mood and agitation. The patient is not nervous/anxious.        Objective:   Physical Exam  Constitutional: She is oriented to person, place, and time. She appears well-developed and well-nourished.  Overweight.  No distress Blood pressure 130/80    HENT:  Head: Normocephalic.  Right Ear: External ear normal.  Left Ear: External ear normal.  Mouth/Throat: Oropharynx is clear and moist.  Eyes: Conjunctivae and EOM are normal. Pupils are equal, round, and reactive to light.  Neck: Normal range of motion. Neck supple. No thyromegaly present.  Cardiovascular: Normal rate, regular rhythm, normal heart sounds and intact distal pulses.   Pulmonary/Chest: Effort normal and breath sounds normal.  Abdominal: Soft. Bowel sounds are normal. She exhibits no mass. There is no tenderness.  Musculoskeletal: Normal range of motion.  Lymphadenopathy:    She has no cervical adenopathy.  Neurological: She is alert and oriented to person, place, and time.  Skin: Skin is warm and dry. No rash noted.  Psychiatric: She has a normal mood and affect. Her behavior is normal.          Assessment & Plan:   Hypothyroidism.  We'll continue present levothyroxine dose.  We'll check in May at the time of her annual exam Restless leg syndrome.  No change in therapy Fibromyalgia  CPX as scheduled

## 2015-01-22 NOTE — Patient Instructions (Signed)
Annual preventive health examination.  As scheduled

## 2015-01-22 NOTE — Progress Notes (Signed)
Pre visit review using our clinic review tool, if applicable. No additional management support is needed unless otherwise documented below in the visit note. 

## 2015-01-25 ENCOUNTER — Encounter: Payer: Self-pay | Admitting: Internal Medicine

## 2015-04-23 ENCOUNTER — Other Ambulatory Visit (INDEPENDENT_AMBULATORY_CARE_PROVIDER_SITE_OTHER): Payer: Medicare Other

## 2015-04-23 DIAGNOSIS — Z0000 Encounter for general adult medical examination without abnormal findings: Secondary | ICD-10-CM

## 2015-04-23 LAB — POCT URINALYSIS DIPSTICK
Bilirubin, UA: NEGATIVE
Blood, UA: NEGATIVE
Glucose, UA: NEGATIVE
Ketones, UA: NEGATIVE
Leukocytes, UA: NEGATIVE
Nitrite, UA: NEGATIVE
Protein, UA: NEGATIVE
Spec Grav, UA: 1.02
Urobilinogen, UA: 0.2
pH, UA: 7

## 2015-04-23 LAB — CBC WITH DIFFERENTIAL/PLATELET
Basophils Absolute: 0 10*3/uL (ref 0.0–0.1)
Basophils Relative: 0.7 % (ref 0.0–3.0)
Eosinophils Absolute: 0.3 10*3/uL (ref 0.0–0.7)
Eosinophils Relative: 3.7 % (ref 0.0–5.0)
HCT: 38.9 % (ref 36.0–46.0)
Hemoglobin: 13.1 g/dL (ref 12.0–15.0)
Lymphocytes Relative: 29.4 % (ref 12.0–46.0)
Lymphs Abs: 2.1 10*3/uL (ref 0.7–4.0)
MCHC: 33.6 g/dL (ref 30.0–36.0)
MCV: 90.1 fl (ref 78.0–100.0)
Monocytes Absolute: 0.5 10*3/uL (ref 0.1–1.0)
Monocytes Relative: 7.1 % (ref 3.0–12.0)
Neutro Abs: 4.2 10*3/uL (ref 1.4–7.7)
Neutrophils Relative %: 59.1 % (ref 43.0–77.0)
Platelets: 272 10*3/uL (ref 150.0–400.0)
RBC: 4.32 Mil/uL (ref 3.87–5.11)
RDW: 13.2 % (ref 11.5–15.5)
WBC: 7.1 10*3/uL (ref 4.0–10.5)

## 2015-04-23 LAB — BASIC METABOLIC PANEL
BUN: 23 mg/dL (ref 6–23)
CO2: 29 mEq/L (ref 19–32)
Calcium: 9.7 mg/dL (ref 8.4–10.5)
Chloride: 105 mEq/L (ref 96–112)
Creatinine, Ser: 0.7 mg/dL (ref 0.40–1.20)
GFR: 87.94 mL/min (ref >60.00)
Glucose, Bld: 94 mg/dL (ref 70–99)
Potassium: 4.7 mEq/L (ref 3.5–5.1)
Sodium: 142 mEq/L (ref 135–145)

## 2015-04-23 LAB — LIPID PANEL
Cholesterol: 208 mg/dL — ABNORMAL HIGH (ref 0–200)
HDL: 55 mg/dL (ref >39.00)
LDL Cholesterol: 128 mg/dL — ABNORMAL HIGH (ref 0–99)
NonHDL: 153
Total CHOL/HDL Ratio: 4
Triglycerides: 126 mg/dL (ref 0.0–149.0)
VLDL: 25.2 mg/dL (ref 0.0–40.0)

## 2015-04-23 LAB — HEPATIC FUNCTION PANEL
ALT: 16 U/L (ref 0–35)
AST: 18 U/L (ref 0–37)
Albumin: 4.2 g/dL (ref 3.5–5.2)
Alkaline Phosphatase: 74 U/L (ref 39–117)
Bilirubin, Direct: 0 mg/dL (ref 0.0–0.3)
Total Bilirubin: 0.4 mg/dL (ref 0.2–1.2)
Total Protein: 7.4 g/dL (ref 6.0–8.3)

## 2015-04-23 LAB — TSH: TSH: 1.82 u[IU]/mL (ref 0.35–4.50)

## 2015-04-30 ENCOUNTER — Ambulatory Visit (INDEPENDENT_AMBULATORY_CARE_PROVIDER_SITE_OTHER): Payer: Medicare Other | Admitting: Internal Medicine

## 2015-04-30 ENCOUNTER — Encounter: Payer: Self-pay | Admitting: Internal Medicine

## 2015-04-30 VITALS — BP 120/72 | HR 66 | Temp 98.2°F | Resp 20 | Ht 61.75 in | Wt 194.0 lb

## 2015-04-30 DIAGNOSIS — R0602 Shortness of breath: Secondary | ICD-10-CM

## 2015-04-30 DIAGNOSIS — Z0000 Encounter for general adult medical examination without abnormal findings: Secondary | ICD-10-CM | POA: Diagnosis not present

## 2015-04-30 DIAGNOSIS — E034 Atrophy of thyroid (acquired): Secondary | ICD-10-CM

## 2015-04-30 DIAGNOSIS — R079 Chest pain, unspecified: Secondary | ICD-10-CM

## 2015-04-30 DIAGNOSIS — M8580 Other specified disorders of bone density and structure, unspecified site: Secondary | ICD-10-CM

## 2015-04-30 DIAGNOSIS — Z86010 Personal history of colonic polyps: Secondary | ICD-10-CM

## 2015-04-30 MED ORDER — AZELAIC ACID 15 % EX GEL: CUTANEOUS | Status: DC

## 2015-04-30 NOTE — Patient Instructions (Signed)
Schedule your colonoscopy to help detect colon cancer.  Neurology follow-up as scheduled    It is important that you exercise regularly, at least 20 minutes 3 to 4 times per week.  If you develop chest pain or shortness of breath seek  medical attention.    Health Maintenance Adopting a healthy lifestyle and getting preventive care can go a long way to promote health and wellness. Talk with your health care provider about what schedule of regular examinations is right for you. This is a good chance for you to check in with your provider about disease prevention and staying healthy. In between checkups, there are plenty of things you can do on your own. Experts have done a lot of research about which lifestyle changes and preventive measures are most likely to keep you healthy. Ask your health care provider for more information. WEIGHT AND DIET  Eat a healthy diet  Be sure to include plenty of vegetables, fruits, low-fat dairy products, and lean protein.  Do not eat a lot of foods high in solid fats, added sugars, or salt.  Get regular exercise. This is one of the most important things you can do for your health.  Most adults should exercise for at least 150 minutes each week. The exercise should increase your heart rate and make you sweat (moderate-intensity exercise).  Most adults should also do strengthening exercises at least twice a week. This is in addition to the moderate-intensity exercise.  Maintain a healthy weight  Body mass index (BMI) is a measurement that can be used to identify possible weight problems. It estimates body fat based on height and weight. Your health care provider can help determine your BMI and help you achieve or maintain a healthy weight.  For females 63 years of age and older:   A BMI below 18.5 is considered underweight.  A BMI of 18.5 to 24.9 is normal.  A BMI of 25 to 29.9 is considered overweight.  A BMI of 30 and above is considered obese.   Watch levels of cholesterol and blood lipids  You should start having your blood tested for lipids and cholesterol at 70 years of age, then have this test every 5 years.  You may need to have your cholesterol levels checked more often if:  Your lipid or cholesterol levels are high.  You are older than 70 years of age.  You are at high risk for heart disease.  CANCER SCREENING   Lung Cancer  Lung cancer screening is recommended for adults 38-86 years old who are at high risk for lung cancer because of a history of smoking.  A yearly low-dose CT scan of the lungs is recommended for people who:  Currently smoke.  Have quit within the past 15 years.  Have at least a 30-pack-year history of smoking. A pack year is smoking an average of one pack of cigarettes a day for 1 year.  Yearly screening should continue until it has been 15 years since you quit.  Yearly screening should stop if you develop a health problem that would prevent you from having lung cancer treatment.  Breast Cancer  Practice breast self-awareness. This means understanding how your breasts normally appear and feel.  It also means doing regular breast self-exams. Let your health care provider know about any changes, no matter how small.  If you are in your 20s or 30s, you should have a clinical breast exam (CBE) by a health care provider every 1-3 years as  part of a regular health exam.  If you are 40 or older, have a CBE every year. Also consider having a breast X-ray (mammogram) every year.  If you have a family history of breast cancer, talk to your health care provider about genetic screening.  If you are at high risk for breast cancer, talk to your health care provider about having an MRI and a mammogram every year.  Breast cancer gene (BRCA) assessment is recommended for women who have family members with BRCA-related cancers. BRCA-related cancers  include:  Breast.  Ovarian.  Tubal.  Peritoneal cancers.  Results of the assessment will determine the need for genetic counseling and BRCA1 and BRCA2 testing. Cervical Cancer Routine pelvic examinations to screen for cervical cancer are no longer recommended for nonpregnant women who are considered low risk for cancer of the pelvic organs (ovaries, uterus, and vagina) and who do not have symptoms. A pelvic examination may be necessary if you have symptoms including those associated with pelvic infections. Ask your health care provider if a screening pelvic exam is right for you.   The Pap test is the screening test for cervical cancer for women who are considered at risk.  If you had a hysterectomy for a problem that was not cancer or a condition that could lead to cancer, then you no longer need Pap tests.  If you are older than 65 years, and you have had normal Pap tests for the past 10 years, you no longer need to have Pap tests.  If you have had past treatment for cervical cancer or a condition that could lead to cancer, you need Pap tests and screening for cancer for at least 20 years after your treatment.  If you no longer get a Pap test, assess your risk factors if they change (such as having a new sexual partner). This can affect whether you should start being screened again.  Some women have medical problems that increase their chance of getting cervical cancer. If this is the case for you, your health care provider may recommend more frequent screening and Pap tests.  The human papillomavirus (HPV) test is another test that may be used for cervical cancer screening. The HPV test looks for the virus that can cause cell changes in the cervix. The cells collected during the Pap test can be tested for HPV.  The HPV test can be used to screen women 30 years of age and older. Getting tested for HPV can extend the interval between normal Pap tests from three to five years.  An HPV  test also should be used to screen women of any age who have unclear Pap test results.  After 70 years of age, women should have HPV testing as often as Pap tests.  Colorectal Cancer  This type of cancer can be detected and often prevented.  Routine colorectal cancer screening usually begins at 70 years of age and continues through 70 years of age.  Your health care provider may recommend screening at an earlier age if you have risk factors for colon cancer.  Your health care provider may also recommend using home test kits to check for hidden blood in the stool.  A small camera at the end of a tube can be used to examine your colon directly (sigmoidoscopy or colonoscopy). This is done to check for the earliest forms of colorectal cancer.  Routine screening usually begins at age 23.  Direct examination of the colon should be repeated every 5-10  years through 70 years of age. However, you may need to be screened more often if early forms of precancerous polyps or small growths are found. Skin Cancer  Check your skin from head to toe regularly.  Tell your health care provider about any new moles or changes in moles, especially if there is a change in a mole's shape or color.  Also tell your health care provider if you have a mole that is larger than the size of a pencil eraser.  Always use sunscreen. Apply sunscreen liberally and repeatedly throughout the day.  Protect yourself by wearing long sleeves, pants, a wide-brimmed hat, and sunglasses whenever you are outside. HEART DISEASE, DIABETES, AND HIGH BLOOD PRESSURE   Have your blood pressure checked at least every 1-2 years. High blood pressure causes heart disease and increases the risk of stroke.  If you are between 28 years and 82 years old, ask your health care provider if you should take aspirin to prevent strokes.  Have regular diabetes screenings. This involves taking a blood sample to check your fasting blood sugar  level.  If you are at a normal weight and have a low risk for diabetes, have this test once every three years after 70 years of age.  If you are overweight and have a high risk for diabetes, consider being tested at a younger age or more often. PREVENTING INFECTION  Hepatitis B  If you have a higher risk for hepatitis B, you should be screened for this virus. You are considered at high risk for hepatitis B if:  You were born in a country where hepatitis B is common. Ask your health care provider which countries are considered high risk.  Your parents were born in a high-risk country, and you have not been immunized against hepatitis B (hepatitis B vaccine).  You have HIV or AIDS.  You use needles to inject street drugs.  You live with someone who has hepatitis B.  You have had sex with someone who has hepatitis B.  You get hemodialysis treatment.  You take certain medicines for conditions, including cancer, organ transplantation, and autoimmune conditions. Hepatitis C  Blood testing is recommended for:  Everyone born from [birth year] through 1966.  Anyone with known risk factors for hepatitis C. Sexually transmitted infections (STIs)  You should be screened for sexually transmitted infections (STIs) including gonorrhea and chlamydia if:  You are sexually active and are younger than 70 years of age.  You are older than 70 years of age and your health care provider tells you that you are at risk for this type of infection.  Your sexual activity has changed since you were last screened and you are at an increased risk for chlamydia or gonorrhea. Ask your health care provider if you are at risk.  If you do not have HIV, but are at risk, it may be recommended that you take a prescription medicine daily to prevent HIV infection. This is called pre-exposure prophylaxis (PrEP). You are considered at risk if:  You are sexually active and do not regularly use condoms or know the HIV status  of your partner(s).  You take drugs by injection.  You are sexually active with a partner who has HIV. Talk with your health care provider about whether you are at high risk of being infected with HIV. If you choose to begin PrEP, you should first be tested for HIV. You should then be tested every 3 months for as long as you are  taking PrEP.  PREGNANCY   If you are premenopausal and you may become pregnant, ask your health care provider about preconception counseling.  If you may become pregnant, take 400 to 800 micrograms (mcg) of folic acid every day.  If you want to prevent pregnancy, talk to your health care provider about birth control (contraception). OSTEOPOROSIS AND MENOPAUSE   Osteoporosis is a disease in which the bones lose minerals and strength with aging. This can result in serious bone fractures. Your risk for osteoporosis can be identified using a bone density scan.  If you are 12 years of age or older, or if you are at risk for osteoporosis and fractures, ask your health care provider if you should be screened.  Ask your health care provider whether you should take a calcium or vitamin D supplement to lower your risk for osteoporosis.  Menopause may have certain physical symptoms and risks.  Hormone replacement therapy may reduce some of these symptoms and risks. Talk to your health care provider about whether hormone replacement therapy is right for you.  HOME CARE INSTRUCTIONS   Schedule regular health, dental, and eye exams.  Stay current with your immunizations.   Do not use any tobacco products including cigarettes, chewing tobacco, or electronic cigarettes.  If you are pregnant, do not drink alcohol.  If you are breastfeeding, limit how much and how often you drink alcohol.  Limit alcohol intake to no more than 1 drink per day for nonpregnant women. One drink equals 12 ounces of beer, 5 ounces of wine, or 1 ounces of hard liquor.  Do not use street  drugs.  Do not share needles.  Ask your health care provider for help if you need support or information about quitting drugs.  Tell your health care provider if you often feel depressed.  Tell your health care provider if you have ever been abused or do not feel safe at home. Document Released: 06/29/2011 Document Revised: 04/30/2014 Document Reviewed: 11/15/2013 [facility] Patient Information 2015 [place], [place]. This information is not intended to replace advice given to you by your health care provider. Make sure you discuss any questions you have with your health care provider.

## 2015-04-30 NOTE — Progress Notes (Signed)
Subjective:    Patient ID: Grace Schmitt, female    DOB: 1946/12/20, 69 y.o.   MRN: [id number]  HPI 69 year-old patient who is seen today for an annual exam.  She is followed by neurology for restless leg syndrome. She has a history of colonic polyps and did have followup colonoscopy in October of 2011. She is scheduled for followup GYN exam with mammogram in the near future.  One month ago, she experienced an episode of exertional chest tightness associated with shortness of breath.  This lasted briefly but then recurred with the increased activity.  After this single episode she has had no recurrent symptoms over the past month. Other complaints include some forgetfulness and poor concentration.  She has some chronic fatigue.  She does have a history of fibromyalgia and restless leg syndrome.   1. Risk factors, based on past  M,S,F history  cardiovascular risk factors none  2.  Physical activities: Remains active with regular exercise.  3.  Depression/mood: No history of depression or mood disorder she does have a history of fibromyalgia  4.  Hearing: No deficits  5.  ADL's: Independent in all aspects of daily living  6.  Fall risk: Low  7.  Home safety: No problems identified 8.  Height weight, and visual acuity; height and weight stable. Has had some modest weight gain with Neurontin. She states that there has been a change in her height with measurements and her GYN as well as this office. History of osteopenia on a bone density in 2006 9.  Counseling: Followup bone density recommended 10. Lab orders based on risk factors: Laboratory studies discussed and reviewed  11. Referral : Follow GYN neurology. We'll get a mammogram and bone density study  12. Care plan: Continue regular exercise calcium and vitamin D supplements 13. Cognitive assessment: Alert and oriented with normal affect no cognitive dysfunction 14.  Preventive services will include annual health examinations as  well as gynecologic checks.  Due to her history of osteopenia.  She'll be consider for serial bone density studies.  She maintained on calcium and vitamin D supplementations.  She has a history of colonic polyps.  Will be continued on colonoscopies every 5 years.  Colonoscopy due 2016.  Annual eye examinations recommended.  She'll be continued to be followed by neurology.  Twice annually 15.  Provider list includes primary care GYN neurology and ophthalmology         Preventive Screening-Counseling & Management  Alcohol-Tobacco  Smoking Status: never  Caffeine-Diet-Exercise  Does Patient Exercise: yes   Allergies (verified):  No Known Drug Allergies   Past History:  Past Medical History:   fibromyalgia  remote history hepatitis at age 36  gravida 60, para 3, abortus zero  history of acute rheumatic fever  Colonic polyps, hx of  restless leg syndrome   Past Surgical History:   tonsillectomy 1951  tubal ligation 1978  negative Cardilate stress test 2003  colonoscopy 2006  2011  Family History:   father died age 65, prostate cancer  mother died age 70, dementia, secondary to small vessel disease  One brother- HTN   Social History:  Reviewed history from 05/31/2008 and no changes required.  Married  Regular exercise-yes  Smoking Status: never  Does Patient Exercise: yes   Past Medical History  Diagnosis Date  . COLONIC POLYPS, HX OF 05/31/2008  . FIBROMYALGIA 07/26/2007  . HEPATITIS, HX OF 07/26/2007  . Rheumatic fever   . Restless leg syndrome  Past Surgical History  Procedure Laterality Date  . Tonsillectomy  1951  . Tubal ligation  1978  . Colonoscopy  October 2011    reports that she has never smoked. She does not have any smokeless tobacco history on file. Her alcohol and drug histories are not on file. family history includes Dementia in her mother; Heart disease in her mother; Hypertension in her brother. No Known Allergies   Review of Systems   Constitutional: Negative for fever, appetite change, fatigue and unexpected weight change.  HENT: Negative for congestion, dental problem, ear pain, hearing loss, mouth sores, nosebleeds, sinus pressure, sore throat, tinnitus, trouble swallowing and voice change.   Eyes: Negative for photophobia, pain, redness and visual disturbance.  Respiratory: Negative for cough, chest tightness and shortness of breath.   Cardiovascular: Negative for chest pain, palpitations and leg swelling.  Gastrointestinal: Negative for nausea, vomiting, abdominal pain, diarrhea, constipation, blood in stool, abdominal distention and rectal pain.  Genitourinary: Negative for dysuria, urgency, frequency, hematuria, flank pain, vaginal bleeding, vaginal discharge, difficulty urinating, genital sores, vaginal pain, menstrual problem and pelvic pain.  Musculoskeletal: Negative for back pain, arthralgias and neck stiffness.  Skin: Negative for rash.  Neurological: Negative for dizziness, syncope, speech difficulty, weakness, light-headedness, numbness and headaches.  Hematological: Negative for adenopathy. Does not bruise/bleed easily.  Psychiatric/Behavioral: Negative for suicidal ideas, behavioral problems, self-injury, dysphoric mood and agitation. The patient is not nervous/anxious.        Objective:   Physical Exam  Constitutional: She is oriented to person, place, and time. She appears well-developed and well-nourished.  HENT:  Head: Normocephalic and atraumatic.  Right Ear: External ear normal.  Left Ear: External ear normal.  Mouth/Throat: Oropharynx is clear and moist.  Eyes: Conjunctivae and EOM are normal.  Neck: Normal range of motion. Neck supple. No JVD present. No thyromegaly present.  Cardiovascular: Normal rate, regular rhythm, normal heart sounds and intact distal pulses.   No murmur heard. Pulmonary/Chest: Effort normal and breath sounds normal. She has no wheezes. She has no rales.  Abdominal:  Soft. Bowel sounds are normal. She exhibits no distension and no mass. There is no tenderness. There is no rebound and no guarding.  Musculoskeletal: Normal range of motion. She exhibits no edema or tenderness.  Neurological: She is alert and oriented to person, place, and time. She has normal reflexes. No cranial nerve deficit. She exhibits normal muscle tone. Coordination normal.  Skin: Skin is warm and dry. No rash noted.  Psychiatric: She has a normal mood and affect. Her behavior is normal.          Assessment & Plan:   Unremarkable clinical examination Mild exogenous obesity. Exercise modest weight loss encouraged Osteopenia. We'll check a followup bone density. Calcium vitamin D. supplementations will be continued Restless leg syndrome. Followup neurology Fibromyalgia stable Colonic polyps.  Needs follow-up colonoscopy this year Episode of exertional chest pain 1 month ago.  This has not reoccurred.  Options discussed including observation versus nuclear stress testing for risk stratification

## 2015-04-30 NOTE — Progress Notes (Signed)
Pre visit review using our clinic review tool, if applicable. No additional management support is needed unless otherwise documented below in the visit note. 

## 2015-05-01 ENCOUNTER — Encounter: Payer: Self-pay | Admitting: Internal Medicine

## 2015-05-24 ENCOUNTER — Other Ambulatory Visit: Payer: Self-pay | Admitting: Internal Medicine

## 2015-07-22 ENCOUNTER — Encounter: Payer: Self-pay | Admitting: Internal Medicine

## 2015-11-25 ENCOUNTER — Encounter: Payer: Self-pay | Admitting: Internal Medicine

## 2016-01-15 ENCOUNTER — Encounter (HOSPITAL_COMMUNITY): Payer: Self-pay | Admitting: Emergency Medicine

## 2016-01-15 ENCOUNTER — Telehealth: Payer: Self-pay | Admitting: Internal Medicine

## 2016-01-15 ENCOUNTER — Emergency Department (HOSPITAL_COMMUNITY): Payer: Medicare Other

## 2016-01-15 ENCOUNTER — Observation Stay (HOSPITAL_COMMUNITY)
Admission: EM | Admit: 2016-01-15 | Discharge: 2016-01-16 | Disposition: A | Discharge status: Home / Self Care | Payer: Medicare Other | Attending: Internal Medicine | Admitting: Internal Medicine

## 2016-01-15 DIAGNOSIS — G2581 Restless legs syndrome: Secondary | ICD-10-CM | POA: Insufficient documentation

## 2016-01-15 DIAGNOSIS — M797 Fibromyalgia: Secondary | ICD-10-CM | POA: Diagnosis not present

## 2016-01-15 DIAGNOSIS — E039 Hypothyroidism, unspecified: Secondary | ICD-10-CM | POA: Diagnosis present

## 2016-01-15 DIAGNOSIS — F32A Depression, unspecified: Secondary | ICD-10-CM | POA: Diagnosis present

## 2016-01-15 DIAGNOSIS — Z7982 Long term (current) use of aspirin: Secondary | ICD-10-CM | POA: Diagnosis not present

## 2016-01-15 DIAGNOSIS — R0602 Shortness of breath: Secondary | ICD-10-CM | POA: Diagnosis not present

## 2016-01-15 DIAGNOSIS — Z79899 Other long term (current) drug therapy: Secondary | ICD-10-CM | POA: Insufficient documentation

## 2016-01-15 DIAGNOSIS — E038 Other specified hypothyroidism: Secondary | ICD-10-CM

## 2016-01-15 DIAGNOSIS — R079 Chest pain, unspecified: Secondary | ICD-10-CM | POA: Diagnosis not present

## 2016-01-15 DIAGNOSIS — R0789 Other chest pain: Secondary | ICD-10-CM | POA: Diagnosis not present

## 2016-01-15 DIAGNOSIS — F329 Major depressive disorder, single episode, unspecified: Secondary | ICD-10-CM | POA: Diagnosis present

## 2016-01-15 HISTORY — DX: Other specified postprocedural states: Z98.890

## 2016-01-15 HISTORY — DX: Other specified postprocedural states: R11.2

## 2016-01-15 LAB — CBC WITH DIFFERENTIAL/PLATELET
Basophils Absolute: 0 10*3/uL (ref 0.0–0.1)
Basophils Relative: 1 %
Eosinophils Absolute: 0.3 10*3/uL (ref 0.0–0.7)
Eosinophils Relative: 3 %
HCT: 39.7 % (ref 36.0–46.0)
Hemoglobin: 12.8 g/dL (ref 12.0–15.0)
Lymphocytes Relative: 24 %
Lymphs Abs: 2 10*3/uL (ref 0.7–4.0)
MCH: 31.2 pg (ref 26.0–34.0)
MCHC: 32.2 g/dL (ref 30.0–36.0)
MCV: 96.8 fL (ref 78.0–100.0)
Monocytes Absolute: 0.7 10*3/uL (ref 0.1–1.0)
Monocytes Relative: 8 %
Neutro Abs: 5.6 10*3/uL (ref 1.7–7.7)
Neutrophils Relative %: 64 %
Platelets: 268 10*3/uL (ref 150–400)
RBC: 4.1 MIL/uL (ref 3.87–5.11)
RDW: 13.2 % (ref 11.5–15.5)
WBC: 8.6 10*3/uL (ref 4.0–10.5)

## 2016-01-15 LAB — TROPONIN I
Troponin I: <0.03 ng/mL (ref <0.031)
Troponin I: <0.03 ng/mL (ref <0.031)

## 2016-01-15 LAB — LIPID PANEL
Cholesterol: 263 mg/dL — ABNORMAL HIGH (ref 0–200)
HDL: 62 mg/dL (ref >40)
LDL Cholesterol: 176 mg/dL — ABNORMAL HIGH (ref 0–99)
Total CHOL/HDL Ratio: 4.2 RATIO
Triglycerides: 127 mg/dL (ref <150)
VLDL: 25 mg/dL (ref 0–40)

## 2016-01-15 LAB — BASIC METABOLIC PANEL
Anion gap: 10 (ref 5–15)
BUN: 23 mg/dL — ABNORMAL HIGH (ref 6–20)
CO2: 26 mmol/L (ref 22–32)
Calcium: 9.5 mg/dL (ref 8.9–10.3)
Chloride: 106 mmol/L (ref 101–111)
Creatinine, Ser: 0.72 mg/dL (ref 0.44–1.00)
GFR calc Af Amer: >60 mL/min (ref >60)
GFR calc non Af Amer: >60 mL/min (ref >60)
Glucose, Bld: 97 mg/dL (ref 65–99)
Potassium: 4 mmol/L (ref 3.5–5.1)
Sodium: 142 mmol/L (ref 135–145)

## 2016-01-15 LAB — I-STAT TROPONIN, ED: Troponin i, poc: 0 ng/mL (ref 0.00–0.08)

## 2016-01-15 LAB — TSH: TSH: 1.476 u[IU]/mL (ref 0.350–4.500)

## 2016-01-15 MED ORDER — METHADONE HCL 5 MG PO TABS
7.5000 mg | ORAL_TABLET | Freq: Every day | ORAL | Status: DC
  Administered 2016-01-15: 7.5 mg via ORAL
  Filled 2016-01-15 (×2): qty 2

## 2016-01-15 MED ORDER — COENZYME Q10 30 MG PO CAPS: 60.0000 mg | ORAL_CAPSULE | Freq: Every day | ORAL | Status: DC

## 2016-01-15 MED ORDER — ONDANSETRON HCL 4 MG/2ML IJ SOLN: 4.0000 mg | Freq: Four times a day (QID) | INTRAMUSCULAR | Status: DC | PRN

## 2016-01-15 MED ORDER — LEVOTHYROXINE SODIUM 50 MCG PO TABS
50.0000 ug | ORAL_TABLET | Freq: Every day | ORAL | Status: DC
  Administered 2016-01-16: 50 ug via ORAL
  Filled 2016-01-15: qty 1

## 2016-01-15 MED ORDER — VENLAFAXINE HCL ER 75 MG PO CP24
150.0000 mg | ORAL_CAPSULE | Freq: Every evening | ORAL | Status: DC
  Administered 2016-01-15: 150 mg via ORAL
  Filled 2016-01-15: qty 2

## 2016-01-15 MED ORDER — CALCIUM CARBONATE-VITAMIN D 500-200 MG-UNIT PO TABS
2.0000 | ORAL_TABLET | Freq: Every day | ORAL | Status: DC
  Administered 2016-01-16: 2 via ORAL
  Filled 2016-01-15: qty 2

## 2016-01-15 MED ORDER — ASPIRIN EC 81 MG PO TBEC
81.0000 mg | DELAYED_RELEASE_TABLET | Freq: Every day | ORAL | Status: DC
  Administered 2016-01-15 – 2016-01-16 (×2): 81 mg via ORAL
  Filled 2016-01-15 (×2): qty 1

## 2016-01-15 MED ORDER — CETYLPYRIDINIUM CHLORIDE 0.05 % MT LIQD
7.0000 mL | Freq: Two times a day (BID) | OROMUCOSAL | Status: DC
  Administered 2016-01-15: 7 mL via OROMUCOSAL

## 2016-01-15 MED ORDER — CHLORHEXIDINE GLUCONATE 0.12 % MT SOLN
15.0000 mL | Freq: Two times a day (BID) | OROMUCOSAL | Status: DC
  Administered 2016-01-15 – 2016-01-16 (×2): 15 mL via OROMUCOSAL
  Filled 2016-01-15 (×2): qty 15

## 2016-01-15 MED ORDER — ADULT MULTIVITAMIN W/MINERALS CH
1.0000 | ORAL_TABLET | Freq: Every day | ORAL | Status: DC
  Administered 2016-01-16: 1 via ORAL
  Filled 2016-01-15: qty 1

## 2016-01-15 MED ORDER — CALCIUM CARBONATE-VITAMIN D 600-400 MG-UNIT PO TABS: 2.0000 | ORAL_TABLET | Freq: Every day | ORAL | Status: DC

## 2016-01-15 MED ORDER — ENOXAPARIN SODIUM 30 MG/0.3ML ~~LOC~~ SOLN
30.0000 mg | SUBCUTANEOUS | Status: DC
  Administered 2016-01-15: 30 mg via SUBCUTANEOUS
  Filled 2016-01-15: qty 0.3

## 2016-01-15 MED ORDER — LORATADINE 10 MG PO TABS
10.0000 mg | ORAL_TABLET | Freq: Every day | ORAL | Status: DC
  Administered 2016-01-15: 10 mg via ORAL
  Filled 2016-01-15 (×2): qty 1

## 2016-01-15 MED ORDER — SODIUM CHLORIDE 0.9 % IV SOLN
INTRAVENOUS | Status: DC
  Administered 2016-01-15: 17:00:00 via INTRAVENOUS

## 2016-01-15 MED ORDER — ASPIRIN 81 MG PO TABS: 81.0000 mg | ORAL_TABLET | Freq: Every day | ORAL | Status: DC

## 2016-01-15 MED ORDER — ACETAMINOPHEN 325 MG PO TABS: 650.0000 mg | ORAL_TABLET | ORAL | Status: DC | PRN

## 2016-01-15 MED ORDER — ASPIRIN 81 MG PO CHEW
324.0000 mg | CHEWABLE_TABLET | Freq: Once | ORAL | Status: AC
  Administered 2016-01-15: 324 mg via ORAL
  Filled 2016-01-15: qty 4

## 2016-01-15 MED ORDER — GLUCOSAMINE-CHONDROITIN PO CAPS: 1.0000 | ORAL_CAPSULE | Freq: Every day | ORAL | Status: DC

## 2016-01-15 MED ORDER — ONE-DAILY MULTI VITAMINS PO TABS: 1.0000 | ORAL_TABLET | Freq: Every day | ORAL | Status: DC

## 2016-01-15 NOTE — H&P (Signed)
[facility] History and Physical  Jerzey Dechene [id number] DOB: May 04, 1946 DOA: 01/15/2016  Referring physician: ER PA: Pearlie Oyster  PCP: Nyoka Cowden, MD  Chief Complaint: Chest pain  HPI:  70 year old female with past medical history of hypothyroidism, depression who presented to [facility] long hospital with reports of worsening chest pain in midsternal area which started about few weeks prior to this admission. Patient described pain to be more like pressure like, present at rest and intermittently radiates to left arm. Patient reports pain to be 6 out of 10 in intensity. She takes daily aspirin. She did not try any other pain medication. No reports of shortness of breath or palpitations. No associated lightheadedness or dizziness or loss of consciousness. No other complaints such as abdominal pain, nausea or vomiting. No fevers or chills or cough.  In ED , the, patient was hemodynamically stable. Blood pressure was 118/81. Blood work was unremarkable. Chest x-ray showed no acute cardiopulmonary findings. Troponin was within normal limits and 12-lead EKG showed sinus rhythm. She was admitted for chest pain rule out.    Assessment & Plan    Principal Problem:   Chest pain at rest - Rule out ACS. The first troponin was within normal limits. Cycle cardiac enzymes. - 12-lead EKG on admission showed sinus rhythm - Obtain 2-D echo - Check lipid panel - Continue daily aspirin  Active Problems:   Hypothyroidism - Continue Synthroid - Check TSH    Depression - Continue Effexor   DVT prophylaxis:  - Lovenox subcutaneous  Radiological Exams on Admission: Dg Chest 2 View 01/15/2016  No active cardiopulmonary disease. Electronically Signed   By: Marijo Conception, M.D.   On: 01/15/2016 11:32    EKG: I have personally reviewed EKG. EKG shows sinus rhythm   Code Status: Full Family Communication: Plan of care discussed with the patient  Disposition Plan: Admit for  further evaluation  Leisa Lenz, MD  [facility] Pager [phone]  Time spent in minutes: 55 minutes  Review of Systems:  Constitutional: Negative for fever, chills and malaise/fatigue. Negative for diaphoresis.  HENT: Negative for hearing loss, ear pain, nosebleeds, congestion, sore throat, neck pain, tinnitus and ear discharge.   Eyes: Negative for blurred vision, double vision, photophobia, pain, discharge and redness.  Respiratory: Negative for cough, hemoptysis, sputum production, shortness of breath, wheezing and stridor.   Cardiovascular: per HPI  Gastrointestinal: Negative for nausea, vomiting and abdominal pain. Negative for heartburn, constipation, blood in stool and melena.  Genitourinary: Negative for dysuria, urgency, frequency, hematuria and flank pain.  Musculoskeletal: Negative for myalgias, back pain, joint pain and falls.  Skin: Negative for itching and rash.  Neurological: Negative for dizziness and weakness. Negative for tingling, tremors, sensory change, speech change, focal weakness, loss of consciousness and headaches.  Endo/Heme/Allergies: Negative for environmental allergies and polydipsia. Does not bruise/bleed easily.  Psychiatric/Behavioral: Negative for suicidal ideas. The patient is not nervous/anxious.      History reviewed. No pertinent past medical history. Past Surgical History  Procedure Laterality Date  . Tonsillectomy  1951  . Tubal ligation  1978  . Colonoscopy  October 2011   Social History:  reports that she has never smoked. She does not have any smokeless tobacco history on file. Her alcohol and drug histories are not on file.  No Known Allergies  Family History:  Family History  Problem Relation Age of Onset  . Dementia Mother   . Heart disease Mother     smal vessel disease  .  Hypertension Brother      Prior to Admission medications   Medication Sig Start Date End Date Taking? Authorizing Provider  aspirin 81 MG tablet  Take 81 mg by mouth daily.     Yes Historical Provider, MD  aspirin-acetaminophen-caffeine (EXCEDRIN MIGRAINE) [phone] MG tablet Take 2 tablets by mouth daily as needed (morning pain).   Yes Historical Provider, MD  Azelaic Acid 15 % cream After skin is thoroughly washed and patted dry, gently but thoroughly massage a thin film of azelaic acid cream into the affected area twice daily, in the morning and evening. 04/30/15  Yes Marletta Lor, MD  Calcium Carbonate-Vitamin D (CALCIUM 600+D) 600-400 MG-UNIT per tablet Take 2 tablets by mouth daily.    Yes Historical Provider, MD  co-enzyme Q-10 (CO Q 10) 30 MG capsule Take 60 mg by mouth daily.     Yes Historical Provider, MD  Glucosamine-Chondroit-Vit C-Mn (GLUCOSAMINE-CHONDROITIN) CAPS Take 1 capsule by mouth daily.     Yes Historical Provider, MD  levothyroxine (SYNTHROID, LEVOTHROID) 50 MCG tablet TAKE 1 TABLET (50 MCG TOTAL) BY MOUTH DAILY. 05/24/15  Yes Marletta Lor, MD  loratadine (CLARITIN) 10 MG tablet Take 10 mg by mouth daily.     Yes Historical Provider, MD  methadone (DOLOPHINE) 5 MG tablet Take 7.5 mg by mouth daily.    Yes Historical Provider, MD  Multiple Vitamin (MULTIVITAMIN) tablet Take 1 tablet by mouth daily.     Yes Historical Provider, MD  Nutritional Supplements (SILICA PO) Take 1 capsule by mouth daily.   Yes Historical Provider, MD  venlafaxine XR (EFFEXOR-XR) 75 MG 24 hr capsule Take 150 mg by mouth every evening. 01/03/16  Yes Historical Provider, MD   Physical Exam: Filed Vitals:   01/15/16 1028 01/15/16 1159  BP: 118/81 122/61  Pulse: 77 67  Temp: 98.3 F (36.8 C) 98 F (36.7 C)  TempSrc: Oral Oral  Resp: 18 20  SpO2: 95% 96%    Physical Exam  Constitutional: Appears well-developed and well-nourished. No distress.  HENT: Normocephalic. No tonsillar erythema or exudates Eyes: Conjunctivae are normal. No scleral icterus.  Neck: Normal ROM. Neck supple. No JVD. No tracheal deviation. No thyromegaly.   CVS: RRR, S1/S2 appreciated  Pulmonary: Effort and breath sounds normal, no stridor, rhonchi, wheezes, rales.  Abdominal: Soft. BS +,  no distension, tenderness, rebound or guarding.  Musculoskeletal: Normal range of motion. No edema and no tenderness.  Lymphadenopathy: No lymphadenopathy noted, cervical, inguinal. Neuro: Alert. Normal reflexes, muscle tone coordination. No focal neurologic deficits. Skin: Skin is warm and dry. No rash noted.  No erythema. No pallor.  Psychiatric: Normal mood and affect. Behavior, judgment, thought content normal.   Labs on Admission:  Basic Metabolic Panel:  Recent Labs Lab 01/15/16 1104  NA 142  K 4.0  CL 106  CO2 26  GLUCOSE 97  BUN 23*  CREATININE 0.72  CALCIUM 9.5   Liver Function Tests: No results for input(s): AST, ALT, ALKPHOS, BILITOT, PROT, ALBUMIN in the last 168 hours. No results for input(s): LIPASE, AMYLASE in the last 168 hours. No results for input(s): AMMONIA in the last 168 hours. CBC:  Recent Labs Lab 01/15/16 1104  WBC 8.6  NEUTROABS 5.6  HGB 12.8  HCT 39.7  MCV 96.8  PLT 268   Cardiac Enzymes: No results for input(s): CKTOTAL, CKMB, CKMBINDEX, TROPONINI in the last 168 hours. BNP: Invalid input(s): POCBNP CBG: No results for input(s): GLUCAP in the last 168 hours.  If 7PM-7AM,  please contact night-coverage [email or web address] Password [facility] 01/15/2016, 12:13 PM

## 2016-01-15 NOTE — Telephone Encounter (Signed)
Please schedule pt to see a provider today per Dr.K.

## 2016-01-15 NOTE — Telephone Encounter (Signed)
Patient Name: Grace Schmitt  DOB: 07-31-1946    Initial Comment caller states she is having some discomfort in her chest - it feels very odd - started in the middle and now has gone to one side   Nurse Assessment  Nurse: Julien Girt, RN, Almyra Free Date/Time Eilene Ghazi Time): 01/15/2016 8:43:23 AM  Confirm and document reason for call. If symptomatic, describe symptoms. You must click the next button to save text entered. ---Caller states she is having some discomfort in her chest, " it feels very odd", start's in the middle and now has gone to her left side. This pain began on Monday night and increases with movement. States it never goes away but gets better and worse, increases with a deep breath and movement.  Has the patient traveled out of the country within the last 30 days? ---Not Applicable  Does the patient have any new or worsening symptoms? ---Yes  Will a triage be completed? ---Yes  Related visit to physician within the last 2 weeks? ---No  Does the PT have any chronic conditions? (i.e. diabetes, asthma, etc.) ---Yes  List chronic conditions. ---Fibromyalgia, RLS  Is this a behavioral health or substance abuse call? ---No     Guidelines    Guideline Title Affirmed Question Affirmed Notes  Chest Pain [1] Chest pain lasts > 5 minutes AND [2] age > 69    Final Disposition User   Call EMS 911 Now Julien Girt, RN, Almyra Free    Referrals  [facility] - ED   Disagree/Comply: Disagree  Disagree/Comply Reason: Disagree with instructions

## 2016-01-15 NOTE — Telephone Encounter (Signed)
lmom for pt to call back

## 2016-01-15 NOTE — ED Provider Notes (Signed)
CSN: [id number]     Arrival date & time 01/15/16  1005 History   First MD Initiated Contact with Patient 01/15/16 1034     Chief Complaint  Patient presents with  . Chest Pain     (Consider location/radiation/quality/duration/timing/severity/associated sxs/prior Treatment) Patient is a 70 y.o. female presenting with chest pain. The history is provided by the patient and medical records. No language interpreter was used.  Chest Pain Associated symptoms: no abdominal pain, no cough, no dizziness, no headache, no nausea, no palpitations, no shortness of breath, not vomiting and no weakness     Alyzae Decrescenzo is a 70 y.o. female  who presents to the Emergency Department complaining of exertional central chest pain intermittently over the last 3 weeks. Patient describes pain as "pressure" and states at times pain will radiate to entire chest wall. No pain has occurred at rest. Denies associated shortness of breath. History of similar episode once about a year ago - - followed up with PCP who stated it was likely angina. Informed that if this continued, they would need to do a thorough workup, however she did not experience these symptoms again until recently. No ASA taken PTA.  No hx of HTN, HLD, heart disease. Not a smoker. Both parents with CAD, father required CABG.   Past Medical History  Diagnosis Date  . COLONIC POLYPS, HX OF 05/31/2008  . FIBROMYALGIA 07/26/2007  . HEPATITIS, HX OF 07/26/2007  . Rheumatic fever   . Restless leg syndrome    Past Surgical History  Procedure Laterality Date  . Tonsillectomy  1951  . Tubal ligation  1978  . Colonoscopy  October 2011   Family History  Problem Relation Age of Onset  . Dementia Mother   . Heart disease Mother     smal vessel disease  . Hypertension Brother    Social History  Substance Use Topics  . Smoking status: Never Smoker   . Smokeless tobacco: None  . Alcohol Use: None   OB History    No data available     Review of  Systems  Constitutional: Negative.   HENT: Negative for congestion, rhinorrhea and sore throat.   Eyes: Negative for visual disturbance.  Respiratory: Negative for cough, shortness of breath and wheezing.   Cardiovascular: Positive for chest pain. Negative for palpitations and leg swelling.  Gastrointestinal: Negative for nausea, vomiting and abdominal pain.  Musculoskeletal: Negative for myalgias and arthralgias.  Skin: Negative for rash.  Allergic/Immunologic: Negative for immunocompromised state.  Neurological: Negative for dizziness, weakness and headaches.      Allergies  Review of patient's allergies indicates no known allergies.  Home Medications   Prior to Admission medications   Medication Sig Start Date End Date Taking? Authorizing Provider  aspirin 81 MG tablet Take 81 mg by mouth daily.     Yes Historical Provider, MD  aspirin-acetaminophen-caffeine (EXCEDRIN MIGRAINE) [phone] MG tablet Take 2 tablets by mouth daily as needed (morning pain).   Yes Historical Provider, MD  Azelaic Acid 15 % cream After skin is thoroughly washed and patted dry, gently but thoroughly massage a thin film of azelaic acid cream into the affected area twice daily, in the morning and evening. 04/30/15  Yes Marletta Lor, MD  Calcium Carbonate-Vitamin D (CALCIUM 600+D) 600-400 MG-UNIT per tablet Take 2 tablets by mouth daily.    Yes Historical Provider, MD  co-enzyme Q-10 (CO Q 10) 30 MG capsule Take 60 mg by mouth daily.     Yes Historical  Provider, MD  Glucosamine-Chondroit-Vit C-Mn (GLUCOSAMINE-CHONDROITIN) CAPS Take 1 capsule by mouth daily.     Yes Historical Provider, MD  levothyroxine (SYNTHROID, LEVOTHROID) 50 MCG tablet TAKE 1 TABLET (50 MCG TOTAL) BY MOUTH DAILY. 05/24/15  Yes Marletta Lor, MD  loratadine (CLARITIN) 10 MG tablet Take 10 mg by mouth daily.     Yes Historical Provider, MD  methadone (DOLOPHINE) 5 MG tablet Take 7.5 mg by mouth daily.    Yes Historical Provider,  MD  Multiple Vitamin (MULTIVITAMIN) tablet Take 1 tablet by mouth daily.     Yes Historical Provider, MD  Nutritional Supplements (SILICA PO) Take 1 capsule by mouth daily.   Yes Historical Provider, MD  venlafaxine XR (EFFEXOR-XR) 75 MG 24 hr capsule Take 150 mg by mouth every evening. 01/03/16  Yes Historical Provider, MD   BP 122/61 mmHg  Pulse 67  Temp(Src) 98 F (36.7 C) (Oral)  Resp 20  SpO2 96% Physical Exam  Constitutional: She is oriented to person, place, and time. She appears well-developed and well-nourished.  Alert and in no acute distress  HENT:  Head: Normocephalic and atraumatic.  Neck:  No carotid bruits  Cardiovascular: Normal rate, regular rhythm, normal heart sounds and intact distal pulses.  Exam reveals no gallop and no friction rub.   No murmur heard. Pulmonary/Chest: Effort normal and breath sounds normal. No respiratory distress. She has no wheezes. She has no rales. She exhibits no tenderness.  Abdominal: Soft. She exhibits no distension and no mass. There is no tenderness. There is no rebound and no guarding.  Musculoskeletal: She exhibits no edema.  Neurological: She is alert and oriented to person, place, and time.  Skin: Skin is warm and dry. No rash noted. She is not diaphoretic.  Psychiatric: She has a normal mood and affect. Her behavior is normal. Judgment and thought content normal.  Nursing note and vitals reviewed.   ED Course  Procedures (including critical care time) Labs Review Labs Reviewed  BASIC METABOLIC PANEL - Abnormal; Notable for the following:    BUN 23 (*)    All other components within normal limits  CBC WITH DIFFERENTIAL/PLATELET  TROPONIN I  Randolm Idol, ED    Imaging Review Dg Chest 2 View  01/15/2016  CLINICAL DATA:  Chest pain, shortness of breath. EXAM: CHEST  2 VIEW COMPARISON:  None. FINDINGS: The heart size and mediastinal contours are within normal limits. Both lungs are clear. No pneumothorax or pleural  effusion is noted. The visualized skeletal structures are unremarkable. IMPRESSION: No active cardiopulmonary disease. Electronically Signed   By: Marijo Conception, M.D.   On: 01/15/2016 11:32   I have personally reviewed and evaluated these images and lab results as part of my medical decision-making.   EKG Interpretation   Date/Time:  Wednesday January 15 2016 10:17:07 EST Ventricular Rate:  79 PR Interval:  141 QRS Duration: 91 QT Interval:  391 QTC Calculation: 448 R Axis:   13 Text Interpretation:  Sinus rhythm Probable anteroseptal infarct, old  Confirmed by ALLEN  MD, ANTHONY ([id number]) on 01/15/2016 11:24:45 AM      MDM   Final diagnoses:  Exertional chest pain   Ambriella Wider presents with exertional chest pain.   Labs:Troponin, CBC, BMP Imaging:CXR [employer]rapeutics: ASA  Plan: 1st troponin negative, other labs reassuring. EKG and CXR reviewed and are reassuring. Due to Heart score of 5 and history concerning for cardiac etiology, will admit for further evaluation.   Patient seen by and discussed  with Dr. Zenia Resides who agrees with treatment plan.   Surgical Licensed [facility] Naman Spychalski, PA-C 01/15/16 1211  Lacretia Leigh, MD 01/20/16 1455

## 2016-01-15 NOTE — ED Notes (Signed)
Per pt, states exertional chest pain/pressure on and off for weeks-occurs after walking up stairs or walking long distances-states pressure and ache vs pain

## 2016-01-15 NOTE — ED Notes (Signed)
Will draw admission labs @ 1410 w/ troponin I

## 2016-01-15 NOTE — ED Provider Notes (Signed)
Medical screening examination/treatment/procedure(s) were conducted as a shared visit with non-physician practitioner(s) and myself.  I personally evaluated the patient during the encounter.   EKG Interpretation   Date/Time:  Wednesday January 15 2016 10:17:07 EST Ventricular Rate:  79 PR Interval:  141 QRS Duration: 91 QT Interval:  391 QTC Calculation: 448 R Axis:   13 Text Interpretation:  Sinus rhythm Probable anteroseptal infarct, old  Confirmed by Konya Fauble  MD, Theola Cuellar ([id number]) on 01/15/2016 11:24:45 AM     Patient here complaining of substernal exertional chest pain that improves with rest and associated with dyspnea. Patient's EKG without signs of acute coronary syndrome. First troponin here is negative but in light of her symptoms we'll admit for cardiac evaluation  Lacretia Leigh, MD 01/15/16 1159

## 2016-01-15 NOTE — Telephone Encounter (Signed)
Please schedule office evaluation

## 2016-01-15 NOTE — Progress Notes (Addendum)
PHARMACIST - PHYSICIAN ORDER COMMUNICATION  CONCERNING: P&T Medication Policy on Herbal Medications  DESCRIPTION:  This patient's order for:  Co-enzyme Q-10 and glucosamine-chondroitin has been noted.  This product(s) is classified as an "herbal" or natural product. Due to a lack of definitive safety studies or FDA approval, nonstandard manufacturing practices, plus the potential risk of unknown drug-drug interactions while on inpatient medications, the Pharmacy and Therapeutics Committee does not permit the use of "herbal" or natural products of this type within [facility].   ACTION TAKEN: The pharmacy department is unable to verify this order at this time and your patient has been informed of this safety policy. Please reevaluate patient's clinical condition at discharge and address if the herbal or natural product(s) should be resumed at that time.  Dia Sitter, PharmD, BCPS 01/15/2016 4:50 PM

## 2016-01-16 ENCOUNTER — Observation Stay (HOSPITAL_BASED_OUTPATIENT_CLINIC_OR_DEPARTMENT_OTHER): Payer: Medicare Other

## 2016-01-16 DIAGNOSIS — R079 Chest pain, unspecified: Secondary | ICD-10-CM

## 2016-01-16 DIAGNOSIS — F329 Major depressive disorder, single episode, unspecified: Secondary | ICD-10-CM | POA: Diagnosis not present

## 2016-01-16 LAB — TROPONIN I: Troponin I: <0.03 ng/mL (ref <0.031)

## 2016-01-16 MED ORDER — LORATADINE 10 MG PO TABS: 10.0000 mg | ORAL_TABLET | Freq: Every day | ORAL | Status: DC

## 2016-01-16 MED ORDER — METHADONE HCL 5 MG PO TABS: 7.5000 mg | ORAL_TABLET | Freq: Every day | ORAL | Status: DC

## 2016-01-16 NOTE — Discharge Instructions (Signed)

## 2016-01-16 NOTE — Discharge Summary (Signed)
Physician Discharge Summary  Grace Schmitt [id number] DOB: [birth year] DOA: 01/15/2016  PCP: Nyoka Cowden, MD  Admit date: 01/15/2016 Discharge date: 01/16/2016  Recommendations for Outpatient Follow-up:  1. Pt will need to follow up with cardiologist in AM for consideration of stress test, appointment scheduled and pt made aware   Discharge Diagnoses:  Principal Problem:   Chest pain at rest Active Problems:   Hypothyroidism   Depression   Discharge Condition: Stable  Diet recommendation: Heart healthy diet discussed in details   HPI: 70 year old female with past medical history of hypothyroidism, depression who presented to [facility] long hospital with reports of worsening chest pain in midsternal area which started about few weeks prior to this admission. Patient described pain to be more like pressure like, present at rest and intermittently radiates to left arm. Patient reports pain to be 6 out of 10 in intensity. She takes daily aspirin. She did not try any other pain medication. No reports of shortness of breath or palpitations. No associated lightheadedness or dizziness or loss of consciousness. No other complaints such as abdominal pain, nausea or vomiting. No fevers or chills or cough.  In ED , the, patient was hemodynamically stable. Blood pressure was 118/81. Blood work was unremarkable. Chest x-ray showed no acute cardiopulmonary findings. Troponin was within normal limits and 12-lead EKG showed sinus rhythm. She was admitted for chest pain rule out.   Assessment & Plan    Principal Problem:  Chest pain at rest - CE x3 negative but pt's symptoms over the past year certainly worrisome for angina - 12-lead EKG on admission showed sinus rhythm - pt has an appointment with cardiologist in AM for consideration of stress test - chest pain resolved   Active Problems:  Hypothyroidism - Continue Synthroid   Depression - Continue Effexor         Discharge Exam: Filed Vitals:   01/16/16 0125 01/16/16 0513  BP: 96/53 99/54  Pulse: 73 72  Temp: 98.1 F (36.7 C) 97.7 F (36.5 C)  Resp: 18 20   Filed Vitals:   01/15/16 1638 01/15/16 2200 01/16/16 0125 01/16/16 0513  BP: 112/62 117/58 96/53 99/54   Pulse: 65 77 73 72  Temp: 98 F (36.7 C) 98.1 F (36.7 C) 98.1 F (36.7 C) 97.7 F (36.5 C)  TempSrc: Oral Oral Oral Oral  Resp: 16 18 18 20   Height: 5\' 3"  (1.6 m)     Weight: 90.266 kg (199 lb)     SpO2: 99% 94% 94% 95%    General: Pt is alert, follows commands appropriately, not in acute distress Cardiovascular: Regular rate and rhythm, S1/S2 +, no murmurs, no rubs, no gallops Respiratory: Clear to auscultation bilaterally, no wheezing, no crackles, no rhonchi Abdominal: Soft, non tender, non distended, bowel sounds +, no guarding  Discharge Instructions  Discharge Instructions    Diet - low sodium heart healthy    Complete by:  As directed      Increase activity slowly    Complete by:  As directed             Medication List    TAKE these medications        aspirin 81 MG tablet  Take 81 mg by mouth daily.     aspirin-acetaminophen-caffeine 250-250-65 MG tablet  Commonly known as:  EXCEDRIN MIGRAINE  Take 2 tablets by mouth daily as needed (morning pain).     Azelaic Acid 15 % cream  After skin is thoroughly washed and patted dry,  gently but thoroughly massage a thin film of azelaic acid cream into the affected area twice daily, in the morning and evening.     CALCIUM 600+D 600-400 MG-UNIT tablet  Generic drug:  Calcium Carbonate-Vitamin D  Take 2 tablets by mouth daily.     CO Q 10 30 MG capsule  Generic drug:  co-enzyme Q-10  Take 60 mg by mouth daily.     Glucosamine-Chondroitin Caps  Take 1 capsule by mouth daily.     levothyroxine 50 MCG tablet  Commonly known as:  SYNTHROID, LEVOTHROID  TAKE 1 TABLET (50 MCG TOTAL) BY MOUTH DAILY.     loratadine 10 MG tablet  Commonly known as:   CLARITIN  Take 10 mg by mouth daily.     methadone 5 MG tablet  Commonly known as:  DOLOPHINE  Take 7.5 mg by mouth daily.     multivitamin tablet  Take 1 tablet by mouth daily.     SILICA PO  Take 1 capsule by mouth daily.     venlafaxine XR 75 MG 24 hr capsule  Commonly known as:  EFFEXOR-XR  Take 150 mg by mouth every evening.            Follow-up Information    Follow up with Nyoka Cowden, MD.   Specialty:  Internal Medicine   Contact information:   [place] [address] [phone]        The results of significant diagnostics from this hospitalization (including imaging, microbiology, ancillary and laboratory) are listed below for reference.     Microbiology: No results found for this or any previous visit (from the past 240 hour(s)).   Labs: Basic Metabolic Panel:  Recent Labs Lab 01/15/16 1104  NA 142  K 4.0  CL 106  CO2 26  GLUCOSE 97  BUN 23*  CREATININE 0.72  CALCIUM 9.5   CBC:  Recent Labs Lab 01/15/16 1104  WBC 8.6  NEUTROABS 5.6  HGB 12.8  HCT 39.7  MCV 96.8  PLT 268   Cardiac Enzymes:  Recent Labs Lab 01/15/16 1235 01/15/16 1900 01/16/16 0120  TROPONINI <0.03 <0.03 <0.03    SIGNED: Time coordinating discharge: 30 minutes  Faye Ramsay, MD  [facility] 01/16/2016, 11:43 AM Pager [phone]  If 7PM-7AM, please contact night-coverage [email or web address] Password [email or web address]

## 2016-01-16 NOTE — Telephone Encounter (Signed)
lmom for pt call back.

## 2016-01-17 ENCOUNTER — Ambulatory Visit (INDEPENDENT_AMBULATORY_CARE_PROVIDER_SITE_OTHER): Payer: Medicare Other | Admitting: Cardiovascular Disease

## 2016-01-17 ENCOUNTER — Telehealth: Payer: Self-pay | Admitting: *Deleted

## 2016-01-17 ENCOUNTER — Encounter: Payer: Self-pay | Admitting: Cardiovascular Disease

## 2016-01-17 VITALS — BP 122/76 | HR 84 | Ht 63.0 in | Wt 200.6 lb

## 2016-01-17 DIAGNOSIS — R079 Chest pain, unspecified: Secondary | ICD-10-CM

## 2016-01-17 DIAGNOSIS — E785 Hyperlipidemia, unspecified: Secondary | ICD-10-CM | POA: Insufficient documentation

## 2016-01-17 DIAGNOSIS — R0789 Other chest pain: Secondary | ICD-10-CM | POA: Diagnosis not present

## 2016-01-17 NOTE — Assessment & Plan Note (Signed)
History of hyperlipidemia with recent lipid profile performed 01/15/16 showed 263, LDL 176 and HDL 62. We have talked about dietary modification. We will recheck this in 3 months and if remains elevated we'll entertain starting a statin drug.

## 2016-01-17 NOTE — Telephone Encounter (Signed)
lmom for pt to call back

## 2016-01-17 NOTE — Telephone Encounter (Signed)
Transition Care Management Follow-up Telephone Call  How have you been since you were released from the hospital? better   Do you understand why you were in the hospital? yes   Do you understand the discharge instrcutions? yes  Items Reviewed:  Medications reviewed: yes  Allergies reviewed: yes  Dietary changes reviewed: yes  Referrals reviewed: yes   Functional Questionnaire:   Activities of Daily Living (ADLs):   She states they are independent in the following: ambulation, bathing and hygiene, feeding, continence, grooming, toileting and dressing States they require assistance with the following: none   Any transportation issues/concerns?: no   Any patient concerns? no   Confirmed importance and date/time of follow-up visits scheduled: yes   Confirmed with patient if condition begins to worsen call PCP or go to the ER.  Patient was given the Call-a-Nurse line [phone]: yes Patient was discharged 01/16/16 Patient was discharged to home Patient has an appointment with Dr Raliegh Ip 01/20/16

## 2016-01-17 NOTE — Patient Instructions (Signed)
Medication Instructions:  Your physician recommends that you continue on your current medications as directed. Please refer to the Current Medication list given to you today.   Labwork: Your physician recommends that you return for lab work in: [place] The lab can be found on the FIRST FLOOR of out building in [place] [address]  Testing/Procedures: Your physician has requested that you have en exercise stress myoview. For further information please visit [email or web address]. Please follow instruction sheet, as given.   Follow-Up: Follow up with Dr. Gwenlyn Found as needed.   Any Other Special Instructions Will Be Listed Below (If Applicable).     If you need a refill on your cardiac medications before your next appointment, please call your pharmacy.

## 2016-01-17 NOTE — Progress Notes (Signed)
01/17/2016 Grace Schmitt   1946-03-19  [id number]  Primary Physician Nyoka Cowden, MD Primary Cardiologist: Lorretta Harp MD Renae Gloss   HPI:  Grace Schmitt is a 70 year old moderately overweight married Caucasian female mother of 3 children, grandmother and one grandchild who is a patient of Dr. Burnice Logan. She was referred by [facility] emergency room for cardiovascular evaluation because of new onset chest pain. Her only risk factor is hyperlipidemia as well as family history of heart disease although both her parents had manifestations of ischemic heart disease in their 43s and 68s. She's never had a heart attack or stroke. She did have an episode of chest pain approximately 3 months ago and again this past Monday which was exertional. She was seen in the emergency room where workup was negative. Enzymes were negative, her EKG showed no acute changes and his 2-D echo was normal as well.   Current Outpatient Prescriptions  Medication Sig Dispense Refill  . aspirin 81 MG tablet Take 81 mg by mouth daily.      Marland Kitchen aspirin-acetaminophen-caffeine (EXCEDRIN MIGRAINE) 250-250-65 MG tablet Take 2 tablets by mouth daily as needed (morning pain).    . Azelaic Acid 15 % cream After skin is thoroughly washed and patted dry, gently but thoroughly massage a thin film of azelaic acid cream into the affected area twice daily, in the morning and evening. 50 g 4  . Calcium Carbonate-Vitamin D (CALCIUM 600+D) 600-400 MG-UNIT per tablet Take 2 tablets by mouth daily.     Marland Kitchen co-enzyme Q-10 (CO Q 10) 30 MG capsule Take 60 mg by mouth daily.      . Glucosamine-Chondroit-Vit C-Mn (GLUCOSAMINE-CHONDROITIN) CAPS Take 1 capsule by mouth daily.      Marland Kitchen levothyroxine (SYNTHROID, LEVOTHROID) 50 MCG tablet TAKE 1 TABLET (50 MCG TOTAL) BY MOUTH DAILY. 90 tablet 3  . loratadine (CLARITIN) 10 MG tablet Take 10 mg by mouth daily.      . methadone (DOLOPHINE) 5 MG tablet Take 7.5 mg by  mouth daily.     . Multiple Vitamin (MULTIVITAMIN) tablet Take 1 tablet by mouth daily.      . Nutritional Supplements (SILICA PO) Take 1 capsule by mouth daily.    Marland Kitchen venlafaxine XR (EFFEXOR-XR) 75 MG 24 hr capsule Take 150 mg by mouth every evening.  11   No current facility-administered medications for this visit.    No Known Allergies  Social History   Social History  . Marital Status: Married    Spouse Name: N/A  . Number of Children: N/A  . Years of Education: N/A   Occupational History  . Not on file.   Social History Main Topics  . Smoking status: Never Smoker   . Smokeless tobacco: Never Used  . Alcohol Use: No  . Drug Use: No  . Sexual Activity: Not Currently   Other Topics Concern  . Not on file   Social History Narrative     Review of Systems: General: negative for chills, fever, night sweats or weight changes.  Cardiovascular: negative for chest pain, dyspnea on exertion, edema, orthopnea, palpitations, paroxysmal nocturnal dyspnea or shortness of breath Dermatological: negative for rash Respiratory: negative for cough or wheezing Urologic: negative for hematuria Abdominal: negative for nausea, vomiting, diarrhea, bright red blood per rectum, melena, or hematemesis Neurologic: negative for visual changes, syncope, or dizziness All other systems reviewed and are otherwise negative except as noted above.    Blood pressure 122/76, pulse 84, height 5'  3" (1.6 m), weight 200 lb 9.6 oz (90.992 kg).  General appearance: alert and no distress Neck: no adenopathy, no carotid bruit, no JVD, supple, symmetrical, trachea midline and thyroid not enlarged, symmetric, no tenderness/mass/nodules Lungs: clear to auscultation bilaterally Heart: regular rate and rhythm, S1, S2 normal, no murmur, click, rub or gallop Extremities: extremities normal, atraumatic, no cyanosis or edema  EKG normal sinus rhythm 84 with nonspecific ST-T wave changes. Number septal Q waves  noted. I personally reviewed this EKG  ASSESSMENT AND PLAN:   Exertional chest pain Grace Schmitt was referred by [facility] room for cardiovascular evaluation because of chest pain. Her only risk factors include hyperlipidemia. Both parents did have heart disease but at advanced age. She has never had a heart attack or stroke. She had chest pain several months ago that was somewhat worrisome but not until this past Monday when she had on and off chest pain with exertion. She was seen in [facility] emergency room where her EKG showed no acute changes and her enzymes were negative. A subsequent 2-D echo was entirely normal. The patient into her left neck jaw and shoulder. I'm going to get an exercise Myoview stress test to risk stratify her and rule out an ischemic etiology.  Hyperlipidemia History of hyperlipidemia with recent lipid profile performed 01/15/16 showed 263, LDL 176 and HDL 62. We have talked about dietary modification. We will recheck this in 3 months and if remains elevated we'll entertain starting a statin drug.      Lorretta Harp MD FACP,FACC,FAHA, [facility] 01/17/2016 9:02 AM

## 2016-01-17 NOTE — Assessment & Plan Note (Signed)
Grace Schmitt was referred by [facility] emergency room for cardiovascular evaluation because of chest pain. Her only risk factors include hyperlipidemia. Both parents did have heart disease but at advanced age. She has never had a heart attack or stroke. She had chest pain several months ago that was somewhat worrisome but not until this past Monday when she had on and off chest pain with exertion. She was seen in [facility] emergency room where her EKG showed no acute changes and her enzymes were negative. A subsequent 2-D echo was entirely normal. The patient into her left neck jaw and shoulder. I'm going to get an exercise Myoview stress test to risk stratify her and rule out an ischemic etiology.

## 2016-01-20 ENCOUNTER — Encounter: Payer: Self-pay | Admitting: Internal Medicine

## 2016-01-20 ENCOUNTER — Ambulatory Visit (INDEPENDENT_AMBULATORY_CARE_PROVIDER_SITE_OTHER): Payer: Medicare Other | Admitting: Internal Medicine

## 2016-01-20 VITALS — BP 130/78 | HR 79 | Temp 98.5°F | Resp 20 | Ht 63.0 in | Wt 200.0 lb

## 2016-01-20 DIAGNOSIS — R079 Chest pain, unspecified: Secondary | ICD-10-CM | POA: Diagnosis not present

## 2016-01-20 DIAGNOSIS — E785 Hyperlipidemia, unspecified: Secondary | ICD-10-CM | POA: Diagnosis not present

## 2016-01-20 DIAGNOSIS — E039 Hypothyroidism, unspecified: Secondary | ICD-10-CM

## 2016-01-20 NOTE — Patient Instructions (Signed)
Heart-Healthy Eating Plan °Many factors influence your heart health, including eating and exercise habits. Heart (coronary) risk increases with abnormal blood fat (lipid) levels. Heart-healthy meal planning includes limiting unhealthy fats, increasing healthy fats, and making other small dietary changes. This includes maintaining a healthy body weight to help keep lipid levels within a normal range. °WHAT IS MY PLAN?  °Your health care provider recommends that you: °· Get no more than _________% of the total calories in your daily diet from fat. °· Limit your intake of saturated fat to less than _________% of your total calories each day. °· Limit the amount of cholesterol in your diet to less than _________ mg per day. °WHAT TYPES OF FAT SHOULD I CHOOSE? °· Choose healthy fats more often. Choose monounsaturated and polyunsaturated fats, such as olive oil and canola oil, flaxseeds, walnuts, almonds, and seeds. °· Eat more omega-3 fats. Good choices include salmon, mackerel, sardines, tuna, flaxseed oil, and ground flaxseeds. Aim to eat fish at least two times each week. °· Limit saturated fats. Saturated fats are primarily found in animal products, such as meats, butter, and cream. Plant sources of saturated fats include palm oil, palm kernel oil, and coconut oil. °· Avoid foods with partially hydrogenated oils in them. These contain trans fats. Examples of foods that contain trans fats are stick margarine, some tub margarines, cookies, crackers, and other baked goods. °WHAT GENERAL GUIDELINES DO I NEED TO FOLLOW? °· Check food labels carefully to identify foods with trans fats or high amounts of saturated fat. °· Fill one half of your plate with vegetables and green salads. Eat 4-5 servings of vegetables per day. A serving of vegetables equals 1 cup of raw leafy vegetables, ½ cup of raw or cooked cut-up vegetables, or ½ cup of vegetable juice. °· Fill one fourth of your plate with whole grains. Look for the word  "whole" as the first word in the ingredient list. °· Fill one fourth of your plate with lean protein foods. °· Eat 4-5 servings of fruit per day. A serving of fruit equals one medium whole fruit, ¼ cup of dried fruit, ½ cup of fresh, frozen, or canned fruit, or ½ cup of 100% fruit juice. °· Eat more foods that contain soluble fiber. Examples of foods that contain this type of fiber are apples, broccoli, carrots, beans, peas, and barley. Aim to get 20-30 g of fiber per day. °· Eat more home-cooked food and less restaurant, buffet, and fast food. °· Limit or avoid alcohol. °· Limit foods that are high in starch and sugar. °· Avoid fried foods. °· Cook foods by using methods other than frying. Baking, boiling, grilling, and broiling are all great options. Other fat-reducing suggestions include: °¨ Removing the skin from poultry. °¨ Removing all visible fats from meats. °¨ Skimming the fat off of stews, soups, and gravies before serving them. °¨ Steaming vegetables in water or broth. °· Lose weight if you are overweight. Losing just 5-10% of your initial body weight can help your overall health and prevent diseases such as diabetes and heart disease. °· Increase your consumption of nuts, legumes, and seeds to 4-5 servings per week. One serving of dried beans or legumes equals ½ cup after being cooked, one serving of nuts equals 1½ ounces, and one serving of seeds equals ½ ounce or 1 tablespoon. °· You may need to monitor your salt (sodium) intake, especially if you have high blood pressure. Talk with your health care provider or dietitian to get   more information about reducing sodium. °WHAT FOODS CAN I EAT? °Grains °Breads, including French, white, pita, wheat, raisin, rye, oatmeal, and Italian. Tortillas that are neither fried nor made with lard or trans fat. Low-fat rolls, including hotdog and hamburger buns and English muffins. Biscuits. Muffins. Waffles. Pancakes. Light popcorn. Whole-grain cereals. Flatbread. Melba  toast. Pretzels. Breadsticks. Rusks. Low-fat snacks and crackers, including oyster, saltine, matzo, graham, animal, and rye. Rice and pasta, including brown rice and those that are made with whole wheat. °Vegetables °All vegetables. °Fruits °All fruits, but limit coconut. °Meats and Other Protein Sources °Lean, well-trimmed beef, veal, pork, and lamb. Chicken and turkey without skin. All fish and shellfish. Wild duck, rabbit, pheasant, and venison. Egg whites or low-cholesterol egg substitutes. Dried beans, peas, lentils, and tofu. Seeds and most nuts. °Dairy °Low-fat or nonfat cheeses, including ricotta, string, and mozzarella. Skim or 1% milk that is liquid, powdered, or evaporated. Buttermilk that is made with low-fat milk. Nonfat or low-fat yogurt. °Beverages °Mineral water. Diet carbonated beverages. °Sweets and Desserts °Sherbets and fruit ices. Honey, jam, marmalade, jelly, and syrups. Meringues and gelatins. Pure sugar candy, such as hard candy, jelly beans, gumdrops, mints, marshmallows, and small amounts of dark chocolate. Angel food cake. °Eat all sweets and desserts in moderation. °Fats and Oils °Nonhydrogenated (trans-free) margarines. Vegetable oils, including soybean, sesame, sunflower, olive, peanut, safflower, corn, canola, and cottonseed. Salad dressings or mayonnaise that are made with a vegetable oil. Limit added fats and oils that you use for cooking, baking, salads, and as spreads. °Other °Cocoa powder. Coffee and tea. All seasonings and condiments. °The items listed above may not be a complete list of recommended foods or beverages. Contact your dietitian for more options. °WHAT FOODS ARE NOT RECOMMENDED? °Grains °Breads that are made with saturated or trans fats, oils, or whole milk. Croissants. Butter rolls. Cheese breads. Sweet rolls. Donuts. Buttered popcorn. Chow mein noodles. High-fat crackers, such as cheese or butter crackers. °Meats and Other Protein Sources °Fatty meats, such as  hotdogs, short ribs, sausage, spareribs, bacon, ribeye roast or steak, and mutton. High-fat deli meats, such as salami and bologna. Caviar. Domestic duck and goose. Organ meats, such as kidney, liver, sweetbreads, brains, gizzard, chitterlings, and heart. °Dairy °Cream, sour cream, cream cheese, and creamed cottage cheese. Whole milk cheeses, including blue (bleu), Monterey Jack, Brie, Colby, American, Havarti, Swiss, cheddar, Camembert, and [place].  Whole or 2% milk that is liquid, evaporated, or condensed. Whole buttermilk. Cream sauce or high-fat cheese sauce. Yogurt that is made from whole milk. °Beverages °Regular sodas and drinks with added sugar. °Sweets and Desserts °Frosting. Pudding. Cookies. Cakes other than angel food cake. Candy that has milk chocolate or white chocolate, hydrogenated fat, butter, coconut, or unknown ingredients. Buttered syrups. Full-fat ice cream or ice cream drinks. °Fats and Oils °Gravy that has suet, meat fat, or shortening. Cocoa butter, hydrogenated oils, palm oil, coconut oil, palm kernel oil. These can often be found in baked products, candy, fried foods, nondairy creamers, and whipped toppings. Solid fats and shortenings, including bacon fat, salt pork, lard, and butter. Nondairy cream substitutes, such as coffee creamers and sour cream substitutes. Salad dressings that are made of unknown oils, cheese, or sour cream. °The items listed above may not be a complete list of foods and beverages to avoid. Contact your dietitian for more information. °  °This information is not intended to replace advice given to you by your health care provider. Make sure you discuss any questions you have with your health   care provider. °  °Document Released: 09/22/2008 Document Revised: 01/04/2015 Document Reviewed: 06/07/2014 °[employer]vier Interactive Patient Education ©2016 [employer] ° °

## 2016-01-20 NOTE — Progress Notes (Signed)
Pre visit review using our clinic review tool, if applicable. No additional management support is needed unless otherwise documented below in the visit note. 

## 2016-01-20 NOTE — Progress Notes (Signed)
Subjective:    Patient ID: Grace Schmitt, female    DOB: 1946-09-12, 70 y.o.   MRN: [id number]  HPI Admit date: 01/15/2016 Discharge date: 01/16/2016  Recommendations for Outpatient Follow-up:  1. Pt will need to follow up with cardiologist in AM for consideration of stress test, appointment scheduled and pt made aware  Discharge Diagnoses:  Principal Problem:  Chest pain at rest Active Problems:  Hypothyroidism  Depression  70 year old patient who is seen today in follow-up.  She was discharged from the hospital 4 days ago for evaluation of exertional chest pain.  She has seen cardiology in follow-up and is scheduled for a nuclear stress test on January 27.  She has had no recurrent chest pain. Both parents had complications of heart disease elderly.  No significant cardiac risk factors, although recent lipid profile was not optimal  Past Medical History  Diagnosis Date  . PONV (postoperative nausea and vomiting)   . Ischemic chest pain ([place])   . Hyperlipidemia     Social History   Social History  . Marital Status: Married    Spouse Name: N/A  . Number of Children: N/A  . Years of Education: N/A   Occupational History  . Not on file.   Social History Main Topics  . Smoking status: Never Smoker   . Smokeless tobacco: Never Used  . Alcohol Use: No  . Drug Use: No  . Sexual Activity: Not Currently   Other Topics Concern  . Not on file   Social History Narrative    Past Surgical History  Procedure Laterality Date  . Tonsillectomy  1951  . Tubal ligation  1978  . Colonoscopy  October 2011    Family History  Problem Relation Age of Onset  . Dementia Mother   . Heart disease Mother     smal vessel disease  . Hypertension Brother     No Known Allergies  Current Outpatient Prescriptions on File Prior to Visit  Medication Sig Dispense Refill  . aspirin 81 MG tablet Take 81 mg by mouth daily.      Marland Kitchen aspirin-acetaminophen-caffeine (EXCEDRIN  MIGRAINE) 250-250-65 MG tablet Take 2 tablets by mouth daily as needed (morning pain).    . Azelaic Acid 15 % cream After skin is thoroughly washed and patted dry, gently but thoroughly massage a thin film of azelaic acid cream into the affected area twice daily, in the morning and evening. 50 g 4  . Calcium Carbonate-Vitamin D (CALCIUM 600+D) 600-400 MG-UNIT per tablet Take 2 tablets by mouth daily.     Marland Kitchen co-enzyme Q-10 (CO Q 10) 30 MG capsule Take 60 mg by mouth daily.      . Glucosamine-Chondroit-Vit C-Mn (GLUCOSAMINE-CHONDROITIN) CAPS Take 1 capsule by mouth daily.      Marland Kitchen levothyroxine (SYNTHROID, LEVOTHROID) 50 MCG tablet TAKE 1 TABLET (50 MCG TOTAL) BY MOUTH DAILY. 90 tablet 3  . loratadine (CLARITIN) 10 MG tablet Take 10 mg by mouth daily.      . methadone (DOLOPHINE) 5 MG tablet Take 7.5 mg by mouth daily.     . Multiple Vitamin (MULTIVITAMIN) tablet Take 1 tablet by mouth daily.      . Nutritional Supplements (SILICA PO) Take 1 capsule by mouth daily.    Marland Kitchen venlafaxine XR (EFFEXOR-XR) 75 MG 24 hr capsule Take 150 mg by mouth every evening.  11   No current facility-administered medications on file prior to visit.    BP 130/78 mmHg  Pulse 79  Temp(Src)  98.5 F (36.9 C) (Oral)  Resp 20  Ht 5\' 3"  (1.6 m)  Wt 200 lb (90.719 kg)  BMI 35.44 kg/m2  SpO2 97%       Review of Systems  Constitutional: Negative.   HENT: Negative for congestion, dental problem, hearing loss, rhinorrhea, sinus pressure, sore throat and tinnitus.   Eyes: Negative for pain, discharge and visual disturbance.  Respiratory: Positive for chest tightness. Negative for cough and shortness of breath.   Cardiovascular: Negative for chest pain, palpitations and leg swelling.  Gastrointestinal: Negative for nausea, vomiting, abdominal pain, diarrhea, constipation, blood in stool and abdominal distention.  Genitourinary: Negative for dysuria, urgency, frequency, hematuria, flank pain, vaginal bleeding, vaginal  discharge, difficulty urinating, vaginal pain and pelvic pain.  Musculoskeletal: Negative for joint swelling, arthralgias and gait problem.  Skin: Negative for rash.  Neurological: Negative for dizziness, syncope, speech difficulty, weakness, numbness and headaches.  Hematological: Negative for adenopathy.  Psychiatric/Behavioral: Negative for behavioral problems, dysphoric mood and agitation. The patient is not nervous/anxious.        Objective:   Physical Exam  Constitutional: She is oriented to person, place, and time. She appears well-developed and well-nourished.  HENT:  Head: Normocephalic.  Right Ear: External ear normal.  Left Ear: External ear normal.  Mouth/Throat: Oropharynx is clear and moist.  Eyes: Conjunctivae and EOM are normal. Pupils are equal, round, and reactive to light.  Neck: Normal range of motion. Neck supple. No thyromegaly present.  Cardiovascular: Normal rate, regular rhythm, normal heart sounds and intact distal pulses.   Posterior tibial pulses full.  Dorsalis pedis pulses faint  Pulmonary/Chest: Effort normal and breath sounds normal.  Abdominal: Soft. Bowel sounds are normal. She exhibits no mass. There is no tenderness.  Musculoskeletal: Normal range of motion.  Lymphadenopathy:    She has no cervical adenopathy.  Neurological: She is alert and oriented to person, place, and time.  Skin: Skin is warm and dry. No rash noted.  Psychiatric: She has a normal mood and affect. Her behavior is normal.          Assessment & Plan:   Exertional chest chest pain.  Rule out coronary artery disease.  Stress test as scheduled.  Continue efforts at risk factor modification Hypothyroidism Fibromyalgia

## 2016-01-22 ENCOUNTER — Telehealth (HOSPITAL_COMMUNITY): Payer: Self-pay

## 2016-01-22 NOTE — Telephone Encounter (Signed)
Encounter complete. 

## 2016-01-24 ENCOUNTER — Ambulatory Visit (HOSPITAL_COMMUNITY)
Admission: RE | Admit: 2016-01-24 | Discharge: 2016-01-24 | Disposition: A | Discharge status: Home / Self Care | Payer: Medicare Other | Source: Ambulatory Visit | Attending: Cardiovascular Disease | Admitting: Cardiovascular Disease

## 2016-01-24 DIAGNOSIS — Z6835 Body mass index (BMI) 35.0-35.9, adult: Secondary | ICD-10-CM | POA: Insufficient documentation

## 2016-01-24 DIAGNOSIS — I2510 Atherosclerotic heart disease of native coronary artery without angina pectoris: Secondary | ICD-10-CM | POA: Diagnosis not present

## 2016-01-24 DIAGNOSIS — R0789 Other chest pain: Secondary | ICD-10-CM | POA: Insufficient documentation

## 2016-01-24 DIAGNOSIS — Z8249 Family history of ischemic heart disease and other diseases of the circulatory system: Secondary | ICD-10-CM | POA: Insufficient documentation

## 2016-01-24 DIAGNOSIS — E663 Overweight: Secondary | ICD-10-CM | POA: Diagnosis not present

## 2016-01-24 DIAGNOSIS — R0609 Other forms of dyspnea: Secondary | ICD-10-CM | POA: Diagnosis not present

## 2016-01-24 DIAGNOSIS — R5383 Other fatigue: Secondary | ICD-10-CM | POA: Insufficient documentation

## 2016-01-24 LAB — MYOCARDIAL PERFUSION IMAGING
Estimated workload: 7 METS
Exercise duration (min): 6 min
LV dias vol: 59 mL
LV sys vol: 15 mL
MPHR: 150 {beats}/min
Peak HR: 150 {beats}/min
Percent HR: 100 %
RPE: 16
Rest HR: 69 {beats}/min
SDS: 1
SRS: 0
SSS: 1
TID: 1.1

## 2016-01-24 MED ORDER — TECHNETIUM TC 99M SESTAMIBI GENERIC - CARDIOLITE
30.5000 | Freq: Once | INTRAVENOUS | Status: AC | PRN
  Administered 2016-01-24: 31 via INTRAVENOUS

## 2016-01-24 MED ORDER — TECHNETIUM TC 99M SESTAMIBI GENERIC - CARDIOLITE
10.1000 | Freq: Once | INTRAVENOUS | Status: AC | PRN
  Administered 2016-01-24: 10.1 via INTRAVENOUS

## 2016-01-28 ENCOUNTER — Encounter: Payer: Self-pay | Admitting: Cardiovascular Disease

## 2016-01-28 NOTE — Telephone Encounter (Signed)
Noted  

## 2016-01-28 NOTE — Telephone Encounter (Signed)
Pt went to the ED that day.   They sent her for stress test with Dr Gwenlyn Found.  Pt did see Dr Raliegh Ip on 1/23.

## 2016-02-11 DIAGNOSIS — M797 Fibromyalgia: Secondary | ICD-10-CM | POA: Diagnosis not present

## 2016-02-11 DIAGNOSIS — G2581 Restless legs syndrome: Secondary | ICD-10-CM | POA: Diagnosis not present

## 2016-02-21 ENCOUNTER — Other Ambulatory Visit: Payer: Self-pay

## 2016-02-21 DIAGNOSIS — Z1231 Encounter for screening mammogram for malignant neoplasm of breast: Secondary | ICD-10-CM

## 2016-03-03 ENCOUNTER — Encounter: Payer: Self-pay | Admitting: Internal Medicine

## 2016-03-10 ENCOUNTER — Ambulatory Visit
Admission: RE | Admit: 2016-03-10 | Discharge: 2016-03-10 | Disposition: A | Discharge status: Home / Self Care | Payer: Medicare Other | Source: Ambulatory Visit

## 2016-03-10 DIAGNOSIS — Z1231 Encounter for screening mammogram for malignant neoplasm of breast: Secondary | ICD-10-CM | POA: Diagnosis not present

## 2016-03-25 ENCOUNTER — Ambulatory Visit (INDEPENDENT_AMBULATORY_CARE_PROVIDER_SITE_OTHER): Payer: Medicare Other | Admitting: Family Medicine

## 2016-03-25 VITALS — BP 120/80 | HR 81 | Temp 98.5°F | Ht 63.0 in | Wt 191.4 lb

## 2016-03-25 DIAGNOSIS — R6883 Chills (without fever): Secondary | ICD-10-CM

## 2016-03-25 DIAGNOSIS — R42 Dizziness and giddiness: Secondary | ICD-10-CM | POA: Diagnosis not present

## 2016-03-25 NOTE — Progress Notes (Signed)
   Subjective:    Patient ID: Grace Schmitt, female    DOB: 1946/09/23, 70 y.o.   MRN: [id number]  HPI   Acute   Patient seen today with complaint of dizziness, lightheaded, hot spells, and chills. Six days ago patient had a two days episode of abdominal pain with multiple loose stools, nausea, with one episode of vomiting. No fever, congestion, cough, or headache.  She denies any focal neurological symptoms.  Denies dysuria, changes to urine color, odor, or frequency.  Illness resolved within 48 hours of onset.   Since illness resolved, patient has experienced intermittent hot flashes and chills, dizziness, an lightheadedness.  She denies any precipitating or alleviating factors. No clear triggers except worse with positional change.   She is describing more of a disequilibrium.  Overall, improved c/w couple of days ago.  Past Medical History  Diagnosis Date  . PONV (postoperative nausea and vomiting)   . Ischemic chest pain ([place])   . Hyperlipidemia    Past Surgical History  Procedure Laterality Date  . Tonsillectomy  1951  . Tubal ligation  1978  . Colonoscopy  October 2011    reports that she has never smoked. She has never used smokeless tobacco. She reports that she does not drink alcohol or use illicit drugs. family history includes Dementia in her mother; Heart disease in her mother; Hypertension in her brother. No Known Allergies     Review of Systems  Constitutional: Negative.   HENT: Negative.   Eyes: Negative.   Respiratory: Negative.   Cardiovascular: Negative.  Negative for chest pain.  Gastrointestinal: Negative.   Genitourinary: Negative for dysuria and hematuria.  Neurological: Positive for dizziness.  Psychiatric/Behavioral: Negative.        Objective:   Physical Exam  Constitutional: She is oriented to person, place, and time. She appears well-developed and well-nourished.  HENT:  Head: Normocephalic and atraumatic.  Right Ear: External ear  normal.  Left Ear: External ear normal.  Nose: Nose normal.  Eyes: Conjunctivae are normal. Pupils are equal, round, and reactive to light.  Neck: Normal range of motion. Neck supple.  Cardiovascular: Normal rate, regular rhythm, normal heart sounds and intact distal pulses.   Pulmonary/Chest: Effort normal and breath sounds normal.  Abdominal: Soft. Bowel sounds are normal.     Neurological: She is alert and oriented to person, place, and time.  Skin: Skin is warm and dry.  Psychiatric: She has a normal mood and affect. Her behavior is normal. Judgment and thought content normal.          Assessment & Plan:  Dizziness- Patient presents today with complaints of dizziness that occur with position changes from supine to sitting up and moving head from right to left.  Symptoms resolve spontaneously.  Patient is negative for orthostatic hypotension.  Cerebellar function grossly intact.  Negative for neurologic focal symptoms. Urine dipstick-negative.  Plan: since her symptoms have improved somewhat and non-focal exam recommend observe for now.  Her symptoms sound more vestibular in nature.

## 2016-03-25 NOTE — Patient Instructions (Signed)

## 2016-03-25 NOTE — Progress Notes (Signed)
Pre visit review using our clinic review tool, if applicable. No additional management support is needed unless otherwise documented below in the visit note. 

## 2016-03-30 ENCOUNTER — Telehealth: Payer: Self-pay | Admitting: Internal Medicine

## 2016-03-30 NOTE — Telephone Encounter (Signed)
Please see message and advise 

## 2016-03-30 NOTE — Telephone Encounter (Signed)
Patient Name: Grace Schmitt  DOB: Apr 01, 1946    Initial Comment Caller states c/o stabbing pain at base of skull   Nurse Assessment  Nurse: Verlin Fester RN, Stanton Kidney Date/Time (Eastern Time): 03/30/2016 1:33:46 PM  Confirm and document reason for call. If symptomatic, describe symptoms. You must click the next button to save text entered. ---Patient states she is having sudden sharp stabbing pain that comes and goes to the base of her skull. She was seen on last Wednesday and was told she might have vertigo.  Has the patient traveled out of the country within the last 30 days? ---No  Does the patient have any new or worsening symptoms? ---Yes  Will a triage be completed? ---Yes  Related visit to physician within the last 2 weeks? ---No  Does the PT have any chronic conditions? (i.e. diabetes, asthma, etc.) ---Yes  List chronic conditions. ---"RLS, fibromyalgia, thyroid"  Is this a behavioral health or substance abuse call? ---No     Guidelines    Guideline Title Affirmed Question Affirmed Notes  Headache [1] New headache AND [2] age > 90    Final Disposition User   See Physician within [facility], RN, Stanton Kidney    Comments  After triage caller states she wants to try OTC meds, ice and see how she is doing tomorrow. Asked her to call back for appt if she changes her mind   Referrals  GO TO FACILITY UNDECIDED   Disagree/Comply: Disagree  Disagree/Comply Reason: Disagree with instructions

## 2016-03-31 ENCOUNTER — Encounter: Payer: Self-pay | Admitting: Internal Medicine

## 2016-03-31 ENCOUNTER — Ambulatory Visit (INDEPENDENT_AMBULATORY_CARE_PROVIDER_SITE_OTHER): Payer: Medicare Other | Admitting: Internal Medicine

## 2016-03-31 VITALS — BP 128/80 | HR 81 | Temp 98.6°F | Resp 20 | Ht 63.0 in | Wt 191.0 lb

## 2016-03-31 DIAGNOSIS — G2581 Restless legs syndrome: Secondary | ICD-10-CM

## 2016-03-31 DIAGNOSIS — M797 Fibromyalgia: Secondary | ICD-10-CM | POA: Diagnosis not present

## 2016-03-31 MED ORDER — ONDANSETRON HCL 4 MG PO TABS: 4.0000 mg | ORAL_TABLET | Freq: Three times a day (TID) | ORAL | Status: DC | PRN

## 2016-03-31 NOTE — Progress Notes (Signed)
Pre visit review using our clinic review tool, if applicable. No additional management support is needed unless otherwise documented below in the visit note. 

## 2016-03-31 NOTE — Patient Instructions (Signed)
Avoids foods high in acid such as tomatoes citrus juices, and spicy foods.  Avoid eating within two hours of lying down or before exercising.  Do not overheat.  Try smaller more frequent meals.  Take Zofran for nausea  Call or return to clinic prn if these symptoms worsen or fail to improve as anticipated.

## 2016-03-31 NOTE — Telephone Encounter (Signed)
Spoke to pt, asked her how she is feeling? Pt said she just called and scheduled an appt for today at 3:00 PM with Dr.K. The pain in her head is not as sharp but she is feeling nauseated and dizzy. Told her okay we will see you then.

## 2016-03-31 NOTE — Telephone Encounter (Signed)
Please call and check on status today

## 2016-03-31 NOTE — Progress Notes (Signed)
Subjective:    Patient ID: Grace Schmitt, female    DOB: 26-Mar-1946, 70 y.o.   MRN: [id number]  HPI  70 year old patient who was stable until about 10 days ago.  At that time.  She experienced some nausea and rare episodes of vomiting with dizziness.  No fever.  She was felt to have perhaps some mild vertigo.  She seemed to improve but over the weekend has had recurrent dizziness, lightheadedness and nausea.  She has had some intermittent increased stool frequency, but no real profuse diarrhea.  Yesterday she had an episode of severe sharp right occipital headaches that were intermittent throughout the day.  Today she has a more generalized mild or headache. Main complaints today include dizziness, nausea with occasional vomiting She has been seen by [facility] neurology for restless leg syndrome and has been on chronic methadone.  Past Medical History  Diagnosis Date  . PONV (postoperative nausea and vomiting)   . Ischemic chest pain ([place])   . Hyperlipidemia     Social History   Social History  . Marital Status: Married    Spouse Name: N/A  . Number of Children: N/A  . Years of Education: N/A   Occupational History  . Not on file.   Social History Main Topics  . Smoking status: Never Smoker   . Smokeless tobacco: Never Used  . Alcohol Use: No  . Drug Use: No  . Sexual Activity: Not Currently   Other Topics Concern  . Not on file   Social History Narrative    Past Surgical History  Procedure Laterality Date  . Tonsillectomy  1951  . Tubal ligation  1978  . Colonoscopy  October 2011    Family History  Problem Relation Age of Onset  . Dementia Mother   . Heart disease Mother     smal vessel disease  . Hypertension Brother     No Known Allergies  Current Outpatient Prescriptions on File Prior to Visit  Medication Sig Dispense Refill  . aspirin 81 MG tablet Take 81 mg by mouth daily.      Marland Kitchen aspirin-acetaminophen-caffeine (EXCEDRIN MIGRAINE) 250-250-65 MG tablet  Take 2 tablets by mouth daily as needed (morning pain).    . Azelaic Acid 15 % cream After skin is thoroughly washed and patted dry, gently but thoroughly massage a thin film of azelaic acid cream into the affected area twice daily, in the morning and evening. 50 g 4  . Calcium Carbonate-Vitamin D (CALCIUM 600+D) 600-400 MG-UNIT per tablet Take 2 tablets by mouth daily.     Marland Kitchen co-enzyme Q-10 (CO Q 10) 30 MG capsule Take 60 mg by mouth daily.      . Glucosamine-Chondroit-Vit C-Mn (GLUCOSAMINE-CHONDROITIN) CAPS Take 1 capsule by mouth daily.      Marland Kitchen levothyroxine (SYNTHROID, LEVOTHROID) 50 MCG tablet TAKE 1 TABLET (50 MCG TOTAL) BY MOUTH DAILY. 90 tablet 3  . loratadine (CLARITIN) 10 MG tablet Take 10 mg by mouth daily.      . methadone (DOLOPHINE) 5 MG tablet Take 7.5 mg by mouth daily.     . Multiple Vitamin (MULTIVITAMIN) tablet Take 1 tablet by mouth daily.      . Nutritional Supplements (SILICA PO) Take 1 capsule by mouth daily.     No current facility-administered medications on file prior to visit.    BP 128/80 mmHg  Pulse 81  Temp(Src) 98.6 F (37 C) (Oral)  Resp 20  Ht 5\' 3"  (1.6 m)  Wt 191 lb (  86.637 kg)  BMI 33.84 kg/m2  SpO2 98%     Review of Systems  Constitutional: Positive for activity change, appetite change and fatigue.  HENT: Negative for congestion, dental problem, hearing loss, rhinorrhea, sinus pressure, sore throat and tinnitus.   Eyes: Negative for pain, discharge and visual disturbance.  Respiratory: Negative for cough and shortness of breath.   Cardiovascular: Negative for chest pain, palpitations and leg swelling.  Gastrointestinal: Positive for nausea and vomiting. Negative for abdominal pain, diarrhea, constipation, blood in stool and abdominal distention.  Genitourinary: Negative for dysuria, urgency, frequency, hematuria, flank pain, vaginal bleeding, vaginal discharge, difficulty urinating, vaginal pain and pelvic pain.  Musculoskeletal: Negative for  joint swelling, arthralgias and gait problem.  Skin: Negative for rash.  Neurological: Positive for dizziness, weakness, light-headedness and headaches. Negative for syncope, speech difficulty and numbness.  Hematological: Negative for adenopathy.  Psychiatric/Behavioral: Negative for behavioral problems, dysphoric mood and agitation. The patient is not nervous/anxious.        Objective:   Physical Exam  Constitutional: She is oriented to person, place, and time. She appears well-developed and well-nourished.  HENT:  Head: Normocephalic.  Right Ear: External ear normal.  Left Ear: External ear normal.  Mouth/Throat: Oropharynx is clear and moist.  Eyes: Conjunctivae and EOM are normal. Pupils are equal, round, and reactive to light.  Neck: Normal range of motion. Neck supple. No thyromegaly present.  Cardiovascular: Normal rate, regular rhythm, normal heart sounds and intact distal pulses.   Pulmonary/Chest: Effort normal and breath sounds normal.  Abdominal: Soft. Bowel sounds are normal. She exhibits no mass. There is no tenderness.  Musculoskeletal: Normal range of motion.  Lymphadenopathy:    She has no cervical adenopathy.  Neurological: She is alert and oriented to person, place, and time.  Normal finger to nose testing Normal gait   Skin: Skin is warm and dry. No rash noted.  Psychiatric: She has a normal mood and affect. Her behavior is normal.          Assessment & Plan:   Probable viral syndrome.  Headaches are improved.  Has persistent nausea, dizziness.  Will treat with Zofran.  We'll clinically observe. History of restless leg syndrome Hypothyroidism History dyslipidemia.  Scheduled for follow-up lipid profile tomorrow

## 2016-04-03 DIAGNOSIS — E785 Hyperlipidemia, unspecified: Secondary | ICD-10-CM | POA: Diagnosis not present

## 2016-04-04 LAB — HEPATIC FUNCTION PANEL
ALT: 22 U/L (ref 6–29)
AST: 21 U/L (ref 10–35)
Albumin: 3.8 g/dL (ref 3.6–5.1)
Alkaline Phosphatase: 73 U/L (ref 33–130)
Bilirubin, Direct: 0.1 mg/dL (ref <=0.2)
Indirect Bilirubin: 0.3 mg/dL (ref 0.2–1.2)
Total Bilirubin: 0.4 mg/dL (ref 0.2–1.2)
Total Protein: 7 g/dL (ref 6.1–8.1)

## 2016-04-04 LAB — LIPID PANEL
Cholesterol: 189 mg/dL (ref 125–200)
HDL: 49 mg/dL (ref >=46)
LDL Cholesterol: 107 mg/dL (ref <130)
Total CHOL/HDL Ratio: 3.9 Ratio (ref <=5.0)
Triglycerides: 163 mg/dL — ABNORMAL HIGH (ref <150)
VLDL: 33 mg/dL — ABNORMAL HIGH (ref <30)

## 2016-04-06 ENCOUNTER — Telehealth: Payer: Self-pay | Admitting: Internal Medicine

## 2016-04-06 ENCOUNTER — Ambulatory Visit (INDEPENDENT_AMBULATORY_CARE_PROVIDER_SITE_OTHER): Payer: Medicare Other | Admitting: Internal Medicine

## 2016-04-06 ENCOUNTER — Encounter: Payer: Self-pay | Admitting: Internal Medicine

## 2016-04-06 VITALS — BP 140/80 | HR 68 | Temp 97.8°F | Resp 20 | Ht 63.0 in | Wt 192.0 lb

## 2016-04-06 DIAGNOSIS — H8309 Labyrinthitis, unspecified ear: Secondary | ICD-10-CM

## 2016-04-06 DIAGNOSIS — G2581 Restless legs syndrome: Secondary | ICD-10-CM | POA: Diagnosis not present

## 2016-04-06 NOTE — Telephone Encounter (Signed)
Return office visit today Okay to use 4 PM visit

## 2016-04-06 NOTE — Progress Notes (Signed)
Pre visit review using our clinic review tool, if applicable. No additional management support is needed unless otherwise documented below in the visit note. 

## 2016-04-06 NOTE — Telephone Encounter (Signed)
Please contact pt and schedule per Dr.K.

## 2016-04-06 NOTE — Telephone Encounter (Signed)
Please see message and advise 

## 2016-04-06 NOTE — Telephone Encounter (Signed)
Pt has been scheduled.  °

## 2016-04-06 NOTE — Progress Notes (Signed)
Subjective:    Patient ID: Grace Schmitt, female    DOB: January 24, 1946, 69 y.o.   MRN: [id number]  HPI 70 year old patient who has been ill since March 24.  Over this period time.  She has had episodic dizziness, nausea and at times a sense of unsteadiness.  Denies any true vertigo with spinning.  3 nights ago she had an episode of fever and chills, but these symptoms have resolved.  She also describes frequent belching.  Over the weekend.  She also described posterior neck pain that has responded nicely to a heating pad.  Associated symptoms include some fatigue. She has a history of restless leg syndrome and is followed by [facility] neurology.  She has hypothyroidism.  Symptoms are aggravated by movement and alleviated by rest She has recently been evaluated for chest pain with a negative nuclear stress test. She has had fairly recent lab including CBC  Past Medical History  Diagnosis Date  . PONV (postoperative nausea and vomiting)   . Ischemic chest pain ([place])   . Hyperlipidemia     Social History   Social History  . Marital Status: Married    Spouse Name: N/A  . Number of Children: N/A  . Years of Education: N/A   Occupational History  . Not on file.   Social History Main Topics  . Smoking status: Never Smoker   . Smokeless tobacco: Never Used  . Alcohol Use: No  . Drug Use: No  . Sexual Activity: Not Currently   Other Topics Concern  . Not on file   Social History Narrative    Past Surgical History  Procedure Laterality Date  . Tonsillectomy  1951  . Tubal ligation  1978  . Colonoscopy  October 2011    Family History  Problem Relation Age of Onset  . Dementia Mother   . Heart disease Mother     smal vessel disease  . Hypertension Brother     No Known Allergies  Current Outpatient Prescriptions on File Prior to Visit  Medication Sig Dispense Refill  . aspirin 81 MG tablet Take 81 mg by mouth daily.      Marland Kitchen aspirin-acetaminophen-caffeine (EXCEDRIN  MIGRAINE) 250-250-65 MG tablet Take 2 tablets by mouth daily as needed (morning pain).    . Azelaic Acid 15 % cream After skin is thoroughly washed and patted dry, gently but thoroughly massage a thin film of azelaic acid cream into the affected area twice daily, in the morning and evening. 50 g 4  . Calcium Carbonate-Vitamin D (CALCIUM 600+D) 600-400 MG-UNIT per tablet Take 2 tablets by mouth daily.     Marland Kitchen co-enzyme Q-10 (CO Q 10) 30 MG capsule Take 60 mg by mouth daily.      . Glucosamine-Chondroit-Vit C-Mn (GLUCOSAMINE-CHONDROITIN) CAPS Take 1 capsule by mouth daily.      Marland Kitchen levothyroxine (SYNTHROID, LEVOTHROID) 50 MCG tablet TAKE 1 TABLET (50 MCG TOTAL) BY MOUTH DAILY. 90 tablet 3  . loratadine (CLARITIN) 10 MG tablet Take 10 mg by mouth daily.      . methadone (DOLOPHINE) 5 MG tablet Take 7.5 mg by mouth daily.     . Multiple Vitamin (MULTIVITAMIN) tablet Take 1 tablet by mouth daily.      . Nutritional Supplements (SILICA PO) Take 1 capsule by mouth daily.    . ondansetron (ZOFRAN) 4 MG tablet Take 1 tablet (4 mg total) by mouth every 8 (eight) hours as needed for nausea or vomiting. 20 tablet 0   No  current facility-administered medications on file prior to visit.    BP 140/80 mmHg  Pulse 68  Temp(Src) 97.8 F (36.6 C) (Oral)  Resp 20  Ht 5\' 3"  (1.6 m)  Wt 192 lb (87.091 kg)  BMI 34.02 kg/m2  SpO2 98%      Review of Systems  Constitutional: Positive for fever, chills, activity change, appetite change and fatigue.  HENT: Negative for congestion, dental problem, hearing loss, rhinorrhea, sinus pressure, sore throat and tinnitus.   Eyes: Negative for pain, discharge and visual disturbance.  Respiratory: Negative for cough and shortness of breath.   Cardiovascular: Negative for chest pain, palpitations and leg swelling.  Gastrointestinal: Negative for nausea, vomiting, abdominal pain, diarrhea, constipation, blood in stool and abdominal distention.  Genitourinary: Negative for  dysuria, urgency, frequency, hematuria, flank pain, vaginal bleeding, vaginal discharge, difficulty urinating, vaginal pain and pelvic pain.  Musculoskeletal: Positive for neck pain and neck stiffness. Negative for joint swelling, arthralgias and gait problem.  Skin: Negative for rash.  Neurological: Positive for dizziness and weakness. Negative for syncope, speech difficulty, numbness and headaches.  Hematological: Negative for adenopathy.  Psychiatric/Behavioral: Negative for behavioral problems, dysphoric mood and agitation. The patient is not nervous/anxious.        Objective:   Physical Exam  Constitutional: She is oriented to person, place, and time. She appears well-developed and well-nourished.  HENT:  Head: Normocephalic.  Right Ear: External ear normal.  Left Ear: External ear normal.  Mouth/Throat: Oropharynx is clear and moist.  Eyes: Conjunctivae and EOM are normal. Pupils are equal, round, and reactive to light.  Neck: Normal range of motion. Neck supple. No thyromegaly present.  Cardiovascular: Normal rate, regular rhythm, normal heart sounds and intact distal pulses.   Pulmonary/Chest: Effort normal and breath sounds normal.  Abdominal: Soft. Bowel sounds are normal. She exhibits no mass. There is no tenderness.  Musculoskeletal: Normal range of motion.  Lymphadenopathy:    She has no cervical adenopathy.  Neurological: She is alert and oriented to person, place, and time. No cranial nerve deficit. Coordination normal.  Normal gait Normal finger to nose Normal rapid alternating movements  Skin: Skin is warm and dry. No rash noted.  Psychiatric: She has a normal mood and affect. Her behavior is normal.          Assessment & Plan:   Episodic symptom complex of dizziness, nausea, fatigue.  Over the weekend she experienced some fever and chills, but this has resolved.  Possible acute viral labyrinthitis.  Options discussed with the patient including brain MRI.  Due  to the extremely low yield.  Will defer at this time.  Patient also wishes to defer.  She will report any new or worsening symptoms

## 2016-04-06 NOTE — Patient Instructions (Signed)
Report any new or worsening symptoms Continue symptomatic treatment with Zofran  Continue Claritin daily

## 2016-04-06 NOTE — Telephone Encounter (Signed)
Pt has been seen twice for nausea and dizziness and is still not feeling any better. States she is basically unable to function at this point. Had a fever on Friday night with chills and feels like her neck is swollen as well.

## 2016-04-17 ENCOUNTER — Ambulatory Visit (AMBULATORY_SURGERY_CENTER): Payer: Self-pay | Admitting: *Deleted

## 2016-04-17 VITALS — Ht 63.0 in | Wt 191.8 lb

## 2016-04-17 DIAGNOSIS — Z86010 Personal history of colonic polyps: Secondary | ICD-10-CM

## 2016-04-17 NOTE — Progress Notes (Signed)
No egg or soy allergy known to patient  No issues with past sedation but some nausea post op with any surgeries  or procedures, no intubation problems  No diet pills per patient No home 02 use per patient  No blood thinners per patient  Pt denies issues with constipation -takes benefiber nightly

## 2016-04-29 ENCOUNTER — Encounter: Payer: Self-pay | Admitting: Internal Medicine

## 2016-04-29 ENCOUNTER — Ambulatory Visit (AMBULATORY_SURGERY_CENTER): Payer: Medicare Other | Admitting: Internal Medicine

## 2016-04-29 ENCOUNTER — Encounter: Payer: Medicare Other | Admitting: Internal Medicine

## 2016-04-29 VITALS — BP 100/53 | HR 61 | Temp 97.3°F | Resp 13 | Ht 63.0 in | Wt 191.0 lb

## 2016-04-29 DIAGNOSIS — D120 Benign neoplasm of cecum: Secondary | ICD-10-CM

## 2016-04-29 DIAGNOSIS — D128 Benign neoplasm of rectum: Secondary | ICD-10-CM

## 2016-04-29 DIAGNOSIS — Z86010 Personal history of colonic polyps: Secondary | ICD-10-CM

## 2016-04-29 MED ORDER — SODIUM CHLORIDE 0.9 % IV SOLN: 500.0000 mL | INTRAVENOUS | Status: DC

## 2016-04-29 NOTE — Progress Notes (Signed)
Called to room to assist during endoscopic procedure.  Patient ID and intended procedure confirmed with present staff. Received instructions for my participation in the procedure from the performing physician.  

## 2016-04-29 NOTE — Patient Instructions (Addendum)
I removed 2 small polyps today. Next routine colonoscopy in 10 years - 2027   I appreciate the opportunity to care for you. Gatha Mayer, MD, FACG   YOU HAD AN ENDOSCOPIC PROCEDURE TODAY AT [facility]:   Refer to the procedure report that was given to you for any specific questions about what was found during the examination.  If the procedure report does not answer your questions, please call your gastroenterologist to clarify.  If you requested that your care partner not be given the details of your procedure findings, then the procedure report has been included in a sealed envelope for you to review at your convenience later.  YOU SHOULD EXPECT: Some feelings of bloating in the abdomen. Passage of more gas than usual.  Walking can help get rid of the air that was put into your GI tract during the procedure and reduce the bloating. If you had a lower endoscopy (such as a colonoscopy or flexible sigmoidoscopy) you may notice spotting of blood in your stool or on the toilet paper. If you underwent a bowel prep for your procedure, you may not have a normal bowel movement for a few days.  Please Note:  You might notice some irritation and congestion in your nose or some drainage.  This is from the oxygen used during your procedure.  There is no need for concern and it should clear up in a day or so.  SYMPTOMS TO REPORT IMMEDIATELY:   Following lower endoscopy (colonoscopy or flexible sigmoidoscopy):  Excessive amounts of blood in the stool  Significant tenderness or worsening of abdominal pains  Swelling of the abdomen that is new, acute  Fever of 100F or higher    For urgent or emergent issues, a gastroenterologist can be reached at any hour by calling [phone].   DIET: Your first meal following the procedure should be a small meal and then it is ok to progress to your normal diet. Heavy or fried foods are harder to digest and may make you feel nauseous  or bloated.  Likewise, meals heavy in dairy and vegetables can increase bloating.  Drink plenty of fluids but you should avoid alcoholic beverages for 24 hours.  ACTIVITY:  You should plan to take it easy for the rest of today and you should NOT DRIVE or use heavy machinery until tomorrow (because of the sedation medicines used during the test).    FOLLOW UP: Our staff will call the number listed on your records the next business day following your procedure to check on you and address any questions or concerns that you may have regarding the information given to you following your procedure. If we do not reach you, we will leave a message.  However, if you are feeling well and you are not experiencing any problems, there is no need to return our call.  We will assume that you have returned to your regular daily activities without incident.  If any biopsies were taken you will be contacted by phone or by letter within the next 1-3 weeks.  Please call us at [phone] if you have not heard about the biopsies in 3 weeks.    SIGNATURES/CONFIDENTIALITY: You and/or your care partner have signed paperwork which will be entered into your electronic medical record.  These signatures attest to the fact that that the information above on your After Visit Summary has been reviewed and is understood.  Full responsibility of the confidentiality of this  discharge information lies with you and/or your care-partner.   Information on polyps given to you today

## 2016-04-29 NOTE — Op Note (Signed)
[facility] Patient Name: Grace Schmitt Procedure Date: 04/29/2016 9:33 AM MRN: [id number] Endoscopist: Gatha Mayer , MD Age: 70 Date of Birth: 1946-01-28 Gender: Female Procedure:                Colonoscopy Indications:              Surveillance: Personal history of adenomatous                            polyps on last colonoscopy > 5 years ago Medicines:                Propofol per Anesthesia, Monitored Anesthesia Care Procedure:                Pre-Anesthesia Assessment:                           - Prior to the procedure, a History and Physical                            was performed, and patient medications and                            allergies were reviewed. The patient's tolerance of                            previous anesthesia was also reviewed. The risks                            and benefits of the procedure and the sedation                            options and risks were discussed with the patient.                            All questions were answered, and informed consent                            was obtained. Prior Anticoagulants: The patient has                            taken no previous anticoagulant or antiplatelet                            agents. ASA Grade Assessment: II - A patient with                            mild systemic disease. After reviewing the risks                            and benefits, the patient was deemed in                            satisfactory condition to undergo the procedure.  After obtaining informed consent, the colonoscope                            was passed under direct vision. Throughout the                            procedure, the patient's blood pressure, pulse, and                            oxygen saturations were monitored continuously. The                            Model CF-HQ190L [id number]) scope was introduced                            through the anus and advanced to  the the cecum,                            identified by appendiceal orifice and ileocecal                            valve. The colonoscopy was performed without                            difficulty. The patient tolerated the procedure                            well. The quality of the bowel preparation was                            excellent. The bowel preparation used was Miralax.                            The ileocecal valve, appendiceal orifice, and                            rectum were photographed. Scope In: 9:44:44 AM Scope Out: 10:01:35 AM Scope Withdrawal Time: 0 hours 13 minutes 29 seconds  Total Procedure Duration: 0 hours 16 minutes 51 seconds  Findings:                 The perianal and digital rectal examinations were                            normal.                           A 6 mm polyp was found in the rectum. The polyp was                            sessile. The polyp was removed with a cold snare.                            Resection and retrieval were complete. Verification  of patient identification for the specimen was                            done. Estimated blood loss was minimal.                           A 2 mm polyp was found in the cecum. The polyp was                            sessile. The polyp was removed with a cold biopsy                            forceps. Resection and retrieval were complete.                            Verification of patient identification for the                            specimen was done. Estimated blood loss was minimal.                           The exam was otherwise without abnormality on                            direct and retroflexion views. Complications:            No immediate complications. Estimated Blood Loss:     Estimated blood loss was minimal. Impression:               - One 6 mm polyp in the rectum, removed with a cold                            snare. Resected and retrieved.                            - One 2 mm polyp in the cecum, removed with a cold                            biopsy forceps. Resected and retrieved.                           - The examination was otherwise normal on direct                            and retroflexion views. Recommendation:           - Patient has a contact number available for                            emergencies. The signs and symptoms of potential                            delayed complications were discussed with the  patient. Return to normal activities tomorrow.                            Written discharge instructions were provided to the                            patient.                           - Resume previous diet.                           - Continue present medications.                           - Repeat colonoscopy is recommended. The                            colonoscopy date will be determined after pathology                            results from today's exam become available for                            review. Gatha Mayer, MD 04/29/2016 10:10:21 AM This report has been signed electronically.

## 2016-04-29 NOTE — Progress Notes (Signed)
A and Ox 3 Report to RN 

## 2016-04-30 ENCOUNTER — Telehealth: Payer: Self-pay

## 2016-04-30 ENCOUNTER — Telehealth: Payer: Self-pay | Admitting: Internal Medicine

## 2016-04-30 NOTE — Telephone Encounter (Signed)
Patient notified she will call back for additional questions or concerns

## 2016-04-30 NOTE — Telephone Encounter (Signed)
Patient had colonoscopy yesterday with polypectomy x 2 one from rectum with cold snare and one from cecum with cold forceps biopsy.  She reports this am she had "gas rolling around"  Spoke with nurse from [facility] and they recommended gas-x.  She notes that her symptoms improved.  She reports that mid morning she developed "pelvic pressure" , " around my anus and vagina".  She reports that it is "uncomfortable pressure and is constant, worse with walking".  She reports that she has passed some gas, but no BM since procedure.  Denies fever, bleeding, nausea or vomiting.  States that this pelvic pressure is very different feeling from the gassy symptoms she had this am.  Dr. Carlean Purl please advise

## 2016-04-30 NOTE — Telephone Encounter (Signed)
  Follow up Call-  Call back number 04/29/2016  Post procedure Call Back phone  # [phone]  Permission to leave phone message Yes     Patient questions:  Do you have a fever, pain , or abdominal swelling? No. Pain Score  0 *  Have you tolerated food without any problems? Yes.    Have you been able to return to your normal activities? Yes.    Do you have any questions about your discharge instructions: Diet   No. Medications  No. Follow up visit  No.  Do you have questions or concerns about your Care? No.  Actions: * If pain score is 4 or above: No action needed, pain <4.

## 2016-04-30 NOTE — Telephone Encounter (Signed)
Does not sound like a serious problem from the procedure but sorry she has some sxs - could be sore from scope insertion Would monitor for fever, bleeding, severe pain and call back if occurs Not unusual for no stool yet

## 2016-05-10 ENCOUNTER — Encounter: Payer: Self-pay | Admitting: Internal Medicine

## 2016-05-10 NOTE — Progress Notes (Signed)
Quick Note:  1 adenoma 1 mucosal polyp Repeat colonoscopy 2022 ______

## 2016-05-15 ENCOUNTER — Other Ambulatory Visit: Payer: Self-pay | Admitting: Internal Medicine

## 2016-05-28 ENCOUNTER — Other Ambulatory Visit (INDEPENDENT_AMBULATORY_CARE_PROVIDER_SITE_OTHER): Payer: Medicare Other

## 2016-05-28 DIAGNOSIS — Z0000 Encounter for general adult medical examination without abnormal findings: Secondary | ICD-10-CM

## 2016-05-28 DIAGNOSIS — R6883 Chills (without fever): Secondary | ICD-10-CM | POA: Diagnosis not present

## 2016-05-28 LAB — POC URINALSYSI DIPSTICK (AUTOMATED)
Bilirubin, UA: NEGATIVE
Blood, UA: NEGATIVE
Glucose, UA: NEGATIVE
Ketones, UA: NEGATIVE
Nitrite, UA: NEGATIVE
Protein, UA: NEGATIVE
Spec Grav, UA: 1.015
Urobilinogen, UA: 0.2
pH, UA: 7.5

## 2016-05-28 LAB — LIPID PANEL
Cholesterol: 210 mg/dL — ABNORMAL HIGH (ref 0–200)
HDL: 45.8 mg/dL (ref >39.00)
LDL Cholesterol: 126 mg/dL — ABNORMAL HIGH (ref 0–99)
NonHDL: 163.91
Total CHOL/HDL Ratio: 5
Triglycerides: 192 mg/dL — ABNORMAL HIGH (ref 0.0–149.0)
VLDL: 38.4 mg/dL (ref 0.0–40.0)

## 2016-05-28 LAB — HEPATIC FUNCTION PANEL
ALT: 15 U/L (ref 0–35)
AST: 17 U/L (ref 0–37)
Albumin: 4 g/dL (ref 3.5–5.2)
Alkaline Phosphatase: 81 U/L (ref 39–117)
Bilirubin, Direct: 0 mg/dL (ref 0.0–0.3)
Total Bilirubin: 0.4 mg/dL (ref 0.2–1.2)
Total Protein: 7.4 g/dL (ref 6.0–8.3)

## 2016-05-28 LAB — BASIC METABOLIC PANEL
BUN: 22 mg/dL (ref 6–23)
CO2: 29 mEq/L (ref 19–32)
Calcium: 9.3 mg/dL (ref 8.4–10.5)
Chloride: 101 mEq/L (ref 96–112)
Creatinine, Ser: 0.72 mg/dL (ref 0.40–1.20)
GFR: 84.86 mL/min (ref >60.00)
Glucose, Bld: 98 mg/dL (ref 70–99)
Potassium: 4 mEq/L (ref 3.5–5.1)
Sodium: 137 mEq/L (ref 135–145)

## 2016-05-28 LAB — CBC WITH DIFFERENTIAL/PLATELET
Basophils Absolute: 0.1 10*3/uL (ref 0.0–0.1)
Basophils Relative: 0.7 % (ref 0.0–3.0)
Eosinophils Absolute: 0.3 10*3/uL (ref 0.0–0.7)
Eosinophils Relative: 3.8 % (ref 0.0–5.0)
HCT: 39.2 % (ref 36.0–46.0)
Hemoglobin: 13.1 g/dL (ref 12.0–15.0)
Lymphocytes Relative: 31.9 % (ref 12.0–46.0)
Lymphs Abs: 2.4 10*3/uL (ref 0.7–4.0)
MCHC: 33.4 g/dL (ref 30.0–36.0)
MCV: 90.5 fl (ref 78.0–100.0)
Monocytes Absolute: 0.6 10*3/uL (ref 0.1–1.0)
Monocytes Relative: 7.4 % (ref 3.0–12.0)
Neutro Abs: 4.2 10*3/uL (ref 1.4–7.7)
Neutrophils Relative %: 56.2 % (ref 43.0–77.0)
Platelets: 284 10*3/uL (ref 150.0–400.0)
RBC: 4.34 Mil/uL (ref 3.87–5.11)
RDW: 12.9 % (ref 11.5–15.5)
WBC: 7.4 10*3/uL (ref 4.0–10.5)

## 2016-05-28 LAB — TSH: TSH: 2.64 u[IU]/mL (ref 0.35–4.50)

## 2016-06-02 ENCOUNTER — Encounter: Payer: Medicare Other | Admitting: Internal Medicine

## 2016-06-05 ENCOUNTER — Ambulatory Visit (INDEPENDENT_AMBULATORY_CARE_PROVIDER_SITE_OTHER): Payer: Medicare Other | Admitting: Internal Medicine

## 2016-06-05 ENCOUNTER — Encounter: Payer: Self-pay | Admitting: Internal Medicine

## 2016-06-05 VITALS — BP 108/64 | HR 72 | Temp 98.4°F | Ht 63.0 in | Wt 188.0 lb

## 2016-06-05 DIAGNOSIS — Z0000 Encounter for general adult medical examination without abnormal findings: Secondary | ICD-10-CM | POA: Diagnosis not present

## 2016-06-05 DIAGNOSIS — E039 Hypothyroidism, unspecified: Secondary | ICD-10-CM | POA: Diagnosis not present

## 2016-06-05 DIAGNOSIS — Z86010 Personal history of colonic polyps: Secondary | ICD-10-CM | POA: Diagnosis not present

## 2016-06-05 DIAGNOSIS — M8580 Other specified disorders of bone density and structure, unspecified site: Secondary | ICD-10-CM | POA: Insufficient documentation

## 2016-06-05 DIAGNOSIS — R079 Chest pain, unspecified: Secondary | ICD-10-CM

## 2016-06-05 DIAGNOSIS — E785 Hyperlipidemia, unspecified: Secondary | ICD-10-CM

## 2016-06-05 HISTORY — DX: Other specified disorders of bone density and structure, unspecified site: M85.80

## 2016-06-05 NOTE — Progress Notes (Signed)
Pre visit review using our clinic review tool, if applicable. No additional management support is needed unless otherwise documented below in the visit note. 

## 2016-06-05 NOTE — Patient Instructions (Signed)
Limit your sodium (Salt) intake  Please check your blood pressure on a regular basis.  If it is consistently greater than 150/90, please make an office appointment.    It is important that you exercise regularly, at least 20 minutes 3 to 4 times per week.  If you develop chest pain or shortness of breath seek  medical attention.  Return in one year for follow-up Menopause is a normal process in which your reproductive ability comes to an end. This process happens gradually over a span of months to years, usually between the ages of 86 and 52. Menopause is complete when you have missed 12 consecutive menstrual periods. It is important to talk with your health care provider about some of the most common conditions that affect postmenopausal women, such as heart disease, cancer, and bone loss (osteoporosis). Adopting a healthy lifestyle and getting preventive care can help to promote your health and wellness. Those actions can also lower your chances of developing some of these common conditions. WHAT SHOULD I KNOW ABOUT MENOPAUSE? During menopause, you may experience a number of symptoms, such as:  Moderate-to-severe hot flashes.  Night sweats.  Decrease in sex drive.  Mood swings.  Headaches.  Tiredness.  Irritability.  Memory problems.  Insomnia. Choosing to treat or not to treat menopausal changes is an individual decision that you make with your health care provider. WHAT SHOULD I KNOW ABOUT HORMONE REPLACEMENT THERAPY AND SUPPLEMENTS? Hormone therapy products are effective for treating symptoms that are associated with menopause, such as hot flashes and night sweats. Hormone replacement carries certain risks, especially as you become older. If you are thinking about using estrogen or estrogen with progestin treatments, discuss the benefits and risks with your health care provider. WHAT SHOULD I KNOW ABOUT HEART DISEASE AND STROKE? Heart disease, heart attack, and stroke become  more likely as you age. This may be due, in part, to the hormonal changes that your body experiences during menopause. These can affect how your body processes dietary fats, triglycerides, and cholesterol. Heart attack and stroke are both medical emergencies. There are many things that you can do to help prevent heart disease and stroke:  Have your blood pressure checked at least every 1-2 years. High blood pressure causes heart disease and increases the risk of stroke.  If you are 85-53 years old, ask your health care provider if you should take aspirin to prevent a heart attack or a stroke.  Do not use any tobacco products, including cigarettes, chewing tobacco, or electronic cigarettes. If you need help quitting, ask your health care provider.  It is important to eat a healthy diet and maintain a healthy weight.  Be sure to include plenty of vegetables, fruits, low-fat dairy products, and lean protein.  Avoid eating foods that are high in solid fats, added sugars, or salt (sodium).  Get regular exercise. This is one of the most important things that you can do for your health.  Try to exercise for at least 150 minutes each week. The type of exercise that you do should increase your heart rate and make you sweat. This is known as moderate-intensity exercise.  Try to do strengthening exercises at least twice each week. Do these in addition to the moderate-intensity exercise.  Know your numbers.Ask your health care provider to check your cholesterol and your blood glucose. Continue to have your blood tested as directed by your health care provider. WHAT SHOULD I KNOW ABOUT CANCER SCREENING? There are several types of  cancer. Take the following steps to reduce your risk and to catch any cancer development as early as possible. Breast Cancer  Practice breast self-awareness.  This means understanding how your breasts normally appear and feel.  It also means doing regular breast  self-exams. Let your health care provider know about any changes, no matter how small.  If you are 80 or older, have a clinician do a breast exam (clinical breast exam or CBE) every year. Depending on your age, family history, and medical history, it may be recommended that you also have a yearly breast X-ray (mammogram).  If you have a family history of breast cancer, talk with your health care provider about genetic screening.  If you are at high risk for breast cancer, talk with your health care provider about having an MRI and a mammogram every year.  Breast cancer (BRCA) gene test is recommended for women who have family members with BRCA-related cancers. Results of the assessment will determine the need for genetic counseling and BRCA1 and for BRCA2 testing. BRCA-related cancers include these types:  Breast. This occurs in males or females.  Ovarian.  Tubal. This may also be called fallopian tube cancer.  Cancer of the abdominal or pelvic lining (peritoneal cancer).  Prostate.  Pancreatic. Cervical, Uterine, and Ovarian Cancer Your health care provider may recommend that you be screened regularly for cancer of the pelvic organs. These include your ovaries, uterus, and vagina. This screening involves a pelvic exam, which includes checking for microscopic changes to the surface of your cervix (Pap test).  For women ages 21-65, health care providers may recommend a pelvic exam and a Pap test every three years. For women ages 44-65, they may recommend the Pap test and pelvic exam, combined with testing for human papilloma virus (HPV), every five years. Some types of HPV increase your risk of cervical cancer. Testing for HPV may also be done on women of any age who have unclear Pap test results.  Other health care providers may not recommend any screening for nonpregnant women who are considered low risk for pelvic cancer and have no symptoms. Ask your health care provider if a screening  pelvic exam is right for you.  If you have had past treatment for cervical cancer or a condition that could lead to cancer, you need Pap tests and screening for cancer for at least 20 years after your treatment. If Pap tests have been discontinued for you, your risk factors (such as having a new sexual partner) need to be reassessed to determine if you should start having screenings again. Some women have medical problems that increase the chance of getting cervical cancer. In these cases, your health care provider may recommend that you have screening and Pap tests more often.  If you have a family history of uterine cancer or ovarian cancer, talk with your health care provider about genetic screening.  If you have vaginal bleeding after reaching menopause, tell your health care provider.  There are currently no reliable tests available to screen for ovarian cancer. Lung Cancer Lung cancer screening is recommended for adults 2-45 years old who are at high risk for lung cancer because of a history of smoking. A yearly low-dose CT scan of the lungs is recommended if you:  Currently smoke.  Have a history of at least 30 pack-years of smoking and you currently smoke or have quit within the past 15 years. A pack-year is smoking an average of one pack of cigarettes per  day for one year. Yearly screening should:  Continue until it has been 15 years since you quit.  Stop if you develop a health problem that would prevent you from having lung cancer treatment. Colorectal Cancer  This type of cancer can be detected and can often be prevented.  Routine colorectal cancer screening usually begins at age 35 and continues through age 74.  If you have risk factors for colon cancer, your health care provider may recommend that you be screened at an earlier age.  If you have a family history of colorectal cancer, talk with your health care provider about genetic screening.  Your health care provider  may also recommend using home test kits to check for hidden blood in your stool.  A small camera at the end of a tube can be used to examine your colon directly (sigmoidoscopy or colonoscopy). This is done to check for the earliest forms of colorectal cancer.  Direct examination of the colon should be repeated every 5-10 years until age 52. However, if early forms of precancerous polyps or small growths are found or if you have a family history or genetic risk for colorectal cancer, you may need to be screened more often. Skin Cancer  Check your skin from head to toe regularly.  Monitor any moles. Be sure to tell your health care provider:  About any new moles or changes in moles, especially if there is a change in a mole's shape or color.  If you have a mole that is larger than the size of a pencil eraser.  If any of your family members has a history of skin cancer, especially at a young age, talk with your health care provider about genetic screening.  Always use sunscreen. Apply sunscreen liberally and repeatedly throughout the day.  Whenever you are outside, protect yourself by wearing long sleeves, pants, a wide-brimmed hat, and sunglasses. WHAT SHOULD I KNOW ABOUT OSTEOPOROSIS? Osteoporosis is a condition in which bone destruction happens more quickly than new bone creation. After menopause, you may be at an increased risk for osteoporosis. To help prevent osteoporosis or the bone fractures that can happen because of osteoporosis, the following is recommended:  If you are 30-15 years old, get at least 1,000 mg of calcium and at least 600 mg of vitamin D per day.  If you are older than age 43 but younger than age 18, get at least 1,200 mg of calcium and at least 600 mg of vitamin D per day.  If you are older than age 66, get at least 1,200 mg of calcium and at least 800 mg of vitamin D per day. Smoking and excessive alcohol intake increase the risk of osteoporosis. Eat foods that are  rich in calcium and vitamin D, and do weight-bearing exercises several times each week as directed by your health care provider. WHAT SHOULD I KNOW ABOUT HOW MENOPAUSE AFFECTS [place]? Depression may occur at any age, but it is more common as you become older. Common symptoms of depression include:  Low or sad mood.  Changes in sleep patterns.  Changes in appetite or eating patterns.  Feeling an overall lack of motivation or enjoyment of activities that you previously enjoyed.  Frequent crying spells. Talk with your health care provider if you think that you are experiencing depression. WHAT SHOULD I KNOW ABOUT IMMUNIZATIONS? It is important that you get and maintain your immunizations. These include:  Tetanus, diphtheria, and pertussis (Tdap) booster vaccine.  Influenza every  Influenza every year before the flu season begins.  Pneumonia vaccine.  Shingles vaccine. Your health care provider may also recommend other immunizations.   This information is not intended to replace advice given to you by your health care provider. Make sure you discuss any questions you have with your health care provider.   Document Released: 02/05/2006 Document Revised: 01/04/2015 Document Reviewed: 08/16/2014 [employer] Interactive Patient Education 2016 [employer]  

## 2016-06-05 NOTE — Progress Notes (Signed)
Subjective:    Patient ID: Grace Schmitt, female    DOB: 1946/09/21, 70 y.o.   MRN: [id number]  HPI 70 year-old patient who is seen today for an annual exam.  She is followed by neurology for restless leg syndrome. She has a history of colonic polyps and did have followup colonoscopy in  May 2017. She is scheduled for followup GYN exam with mammogram in the near future.  One month ago, she experienced an episode of exertional chest tightness associated with shortness of breath.  This lasted briefly but then recurred with the increased activity.  After this single episode she has had no recurrent symptoms over the past month. Other complaints include some forgetfulness and poor concentration.  She has some chronic fatigue.  She does have a history of fibromyalgia and restless leg syndrome.    low risk nuclear stress test January 2017   1. Risk factors, based on past  M,S,F history  cardiovascular risk factors none  2.  Physical activities: Remains active with regular exercise.  3.  Depression/mood: No history of depression or mood disorder she does have a history of fibromyalgia  4.  Hearing: No deficits  5.  ADL's: Independent in all aspects of daily living  6.  Fall risk: Low  7.  Home safety: No problems identified 8.  Height weight, and visual acuity; height and weight stable. Has had some modest weight gain with Neurontin. She states that there has been a change in her height with measurements and her GYN as well as this office. History of osteopenia on a bone density in 2006 9.  Counseling: Followup bone density recommended 10. Lab orders based on risk factors: Laboratory studies discussed and reviewed  11. Referral : Follow GYN neurology. We'll get a mammogram and bone density study  12. Care plan: Continue regular exercise calcium and vitamin D supplements 13. Cognitive assessment: Alert and oriented with normal affect no cognitive dysfunction 14.  Preventive services will  include annual health examinations as well as gynecologic checks.  Due to her history of osteopenia.  She'll be consider for serial bone density studies.  She maintained on calcium and vitamin D supplementations.  She has a history of colonic polyps.  Will be continued on colonoscopies every 5 years.  Colonoscopy due 2016.  Annual eye examinations recommended.  She'll be continued to be followed by neurology.  Twice annually 15.  Provider list includes primary care GYN neurology and ophthalmology         Preventive Screening-Counseling & Management  Alcohol-Tobacco  Smoking Status: never  Caffeine-Diet-Exercise  Does Patient Exercise: yes   Allergies (verified):  No Known Drug Allergies   Past History:  Past Medical History:   fibromyalgia  remote history hepatitis at age 70  gravida 78, para 3, abortus zero  history of acute rheumatic fever  Colonic polyps, hx of  restless leg syndrome   Past Surgical History:   tonsillectomy 1951  tubal ligation 1978  negative Cardilate stress test 2003  colonoscopy 2006  2011 5/17  Family History:   father died age 25, prostate cancer  mother died age 71, dementia, secondary to small vessel disease  One brother- HTN   Social History:  Reviewed history from 05/31/2008 and no changes required.  Married  Regular exercise-yes  Smoking Status: never  Does Patient Exercise: yes   Past Medical History  Diagnosis Date  . PONV (postoperative nausea and vomiting)   . Ischemic chest pain ([place])   . Hyperlipidemia   .  Restless leg syndrome   . Allergy     prn claritin  . Thyroid disease   . Hx of colonic polyps serrated and adenomatous 05/28/2005   Past Surgical History  Procedure Laterality Date  . Tonsillectomy  1951  . Tubal ligation  1978    x2  . Colonoscopy  October 2011    reports that she has never smoked. She has never used smokeless tobacco. She reports that she does not drink alcohol or use illicit drugs. family history  includes Dementia in her mother; Heart disease in her mother; Hypertension in her brother. There is no history of Colon cancer or Colon polyps. Allergies  Allergen Reactions  . Phenergan [Promethazine Hcl] Other (See Comments)    Pt has restless leg syndrome and the phenergan cause the RLS  to worsen      Review of Systems  Constitutional: Negative for fever, appetite change, fatigue and unexpected weight change.  HENT: Negative for congestion, dental problem, ear pain, hearing loss, mouth sores, nosebleeds, sinus pressure, sore throat, tinnitus, trouble swallowing and voice change.   Eyes: Negative for photophobia, pain, redness and visual disturbance.  Respiratory: Negative for cough, chest tightness and shortness of breath.   Cardiovascular: Negative for chest pain, palpitations and leg swelling.  Gastrointestinal: Negative for nausea, vomiting, abdominal pain, diarrhea, constipation, blood in stool, abdominal distention and rectal pain.  Genitourinary: Negative for dysuria, urgency, frequency, hematuria, flank pain, vaginal bleeding, vaginal discharge, difficulty urinating, genital sores, vaginal pain, menstrual problem and pelvic pain.  Musculoskeletal: Negative for back pain, arthralgias and neck stiffness.  Skin: Negative for rash.  Neurological: Negative for dizziness, syncope, speech difficulty, weakness, light-headedness, numbness and headaches.  Hematological: Negative for adenopathy. Does not bruise/bleed easily.  Psychiatric/Behavioral: Negative for suicidal ideas, behavioral problems, self-injury, dysphoric mood and agitation. The patient is not nervous/anxious.        Objective:   Physical Exam  Constitutional: She is oriented to person, place, and time. She appears well-developed and well-nourished.  HENT:  Head: Normocephalic and atraumatic.  Right Ear: External ear normal.  Left Ear: External ear normal.  Mouth/Throat: Oropharynx is clear and moist.  Eyes:  Conjunctivae and EOM are normal.  Neck: Normal range of motion. Neck supple. No JVD present. No thyromegaly present.  Cardiovascular: Normal rate, regular rhythm, normal heart sounds and intact distal pulses.   No murmur heard. Pulmonary/Chest: Effort normal and breath sounds normal. She has no wheezes. She has no rales.  Abdominal: Soft. Bowel sounds are normal. She exhibits no distension and no mass. There is no tenderness. There is no rebound and no guarding.  Musculoskeletal: Normal range of motion. She exhibits no edema or tenderness.  Neurological: She is alert and oriented to person, place, and time. She has normal reflexes. No cranial nerve deficit. She exhibits normal muscle tone. Coordination normal.  Skin: Skin is warm and dry. No rash noted.  Psychiatric: She has a normal mood and affect. Her behavior is normal.          Assessment & Plan:   Unremarkable clinical examination Mild exogenous obesity. Exercise modest weight loss encouraged Osteopenia. We'll check a followup bone density. Calcium vitamin D. supplementations will be continued Restless leg syndrome. Followup neurology Fibromyalgia stable Colonic polyps.  Needs follow-up colonoscopy this year   Nyoka Cowden, MD

## 2016-08-11 DIAGNOSIS — M797 Fibromyalgia: Secondary | ICD-10-CM | POA: Diagnosis not present

## 2016-08-11 DIAGNOSIS — G2581 Restless legs syndrome: Secondary | ICD-10-CM | POA: Diagnosis not present

## 2016-08-23 ENCOUNTER — Emergency Department (HOSPITAL_BASED_OUTPATIENT_CLINIC_OR_DEPARTMENT_OTHER)
Admission: EM | Admit: 2016-08-23 | Discharge: 2016-08-23 | Disposition: A | Discharge status: Home / Self Care | Payer: Medicare Other | Attending: Emergency Medicine | Admitting: Emergency Medicine

## 2016-08-23 ENCOUNTER — Encounter (HOSPITAL_BASED_OUTPATIENT_CLINIC_OR_DEPARTMENT_OTHER): Payer: Self-pay | Admitting: *Deleted

## 2016-08-23 DIAGNOSIS — N390 Urinary tract infection, site not specified: Secondary | ICD-10-CM | POA: Diagnosis not present

## 2016-08-23 DIAGNOSIS — R509 Fever, unspecified: Secondary | ICD-10-CM

## 2016-08-23 DIAGNOSIS — Z79899 Other long term (current) drug therapy: Secondary | ICD-10-CM | POA: Diagnosis not present

## 2016-08-23 DIAGNOSIS — J029 Acute pharyngitis, unspecified: Secondary | ICD-10-CM | POA: Diagnosis present

## 2016-08-23 DIAGNOSIS — Z7982 Long term (current) use of aspirin: Secondary | ICD-10-CM | POA: Diagnosis not present

## 2016-08-23 HISTORY — DX: Fibromyalgia: M79.7

## 2016-08-23 LAB — URINALYSIS, ROUTINE W REFLEX MICROSCOPIC
Bilirubin Urine: NEGATIVE
Glucose, UA: NEGATIVE mg/dL
Ketones, ur: NEGATIVE mg/dL
Nitrite: NEGATIVE
Protein, ur: NEGATIVE mg/dL
Specific Gravity, Urine: 1.019 (ref 1.005–1.030)
pH: 7.5 (ref 5.0–8.0)

## 2016-08-23 LAB — URINE MICROSCOPIC-ADD ON

## 2016-08-23 MED ORDER — CEPHALEXIN 250 MG PO CAPS
500.0000 mg | ORAL_CAPSULE | Freq: Once | ORAL | Status: AC
  Administered 2016-08-23: 500 mg via ORAL
  Filled 2016-08-23: qty 2

## 2016-08-23 MED ORDER — CEPHALEXIN 500 MG PO CAPS: 500.0000 mg | ORAL_CAPSULE | Freq: Two times a day (BID) | ORAL | 0 refills | Status: DC

## 2016-08-23 MED ORDER — ACETAMINOPHEN 325 MG PO TABS
650.0000 mg | ORAL_TABLET | Freq: Once | ORAL | Status: AC
  Administered 2016-08-23: 650 mg via ORAL
  Filled 2016-08-23: qty 2

## 2016-08-23 NOTE — ED Triage Notes (Signed)
Pt reports generalized pain and sore throat since this am. Denies n/v/d. States she has fibromyalgia and thinks this might be a flare-up but is unsure. Reports fever of 101 this am (denies OTC meds PTA). Denies chest pain, cough, sob, nasal congestion.

## 2016-08-23 NOTE — ED Provider Notes (Signed)
[facility] Provider Note   CSN: [id number] Arrival date & time: 08/23/16  [id number]     History   Chief Complaint Chief Complaint  Patient presents with  . Generalized Body Aches  . Sore Throat    HPI Grace Schmitt is a 70 y.o. female.  Patient is a 70 year old female with a history of fibromyalgia and restless leg syndrome presenting today with diffuse body aches, sore throat and suprapubic tenderness that started when waking up this morning. She had a temperature of 101 when she woke up this morning which is also new. She describes the pain in her body is aches. She denies any cough, congestion, nausea, vomiting or diarrhea. She denies any dysuria that she recalls. She denies any sick contacts but is out in public places often.  No frequent UTIs.  No chest pain or shortness of breath.      Sore Throat     Past Medical History:  Diagnosis Date  . Allergy    prn claritin  . Fibromyalgia   . Hx of colonic polyps serrated and adenomatous 05/28/2005  . Hyperlipidemia   . Ischemic chest pain ([place])   . PONV (postoperative nausea and vomiting)   . Restless leg syndrome   . Thyroid disease     Patient Active Problem List   Diagnosis Date Noted  . Osteopenia 06/05/2016  . Restless leg syndrome 03/31/2016  . Fibromyalgia 03/31/2016  . Hyperlipidemia 01/17/2016  . Exertional chest pain   . Chest pain at rest 01/15/2016  . Depression 01/15/2016  . Hypothyroidism 06/04/2014  . Hx of colonic polyps serrated and adenomatous 05/28/2005    Past Surgical History:  Procedure Laterality Date  . COLONOSCOPY  October 2011  . TONSILLECTOMY  1951  . TUBAL LIGATION  1978   x2    OB History    No data available       Home Medications    Prior to Admission medications   Medication Sig Start Date End Date Taking? Authorizing Provider  aspirin 81 MG tablet Take 81 mg by mouth daily.     Yes Historical Provider, MD  aspirin-acetaminophen-caffeine (EXCEDRIN  MIGRAINE) [phone] MG tablet Take 2 tablets by mouth daily as needed (morning pain).   Yes Historical Provider, MD  Azelaic Acid 15 % cream After skin is thoroughly washed and patted dry, gently but thoroughly massage a thin film of azelaic acid cream into the affected area twice daily, in the morning and evening. 04/30/15  Yes Marletta Lor, MD  Calcium Carbonate-Vitamin D (CALCIUM 600+D) 600-400 MG-UNIT per tablet Take 2 tablets by mouth daily.    Yes Historical Provider, MD  co-enzyme Q-10 (CO Q 10) 30 MG capsule Take 60 mg by mouth daily.     Yes Historical Provider, MD  Glucosamine-Chondroit-Vit C-Mn (GLUCOSAMINE-CHONDROITIN) CAPS Take 1 capsule by mouth daily.     Yes Historical Provider, MD  levothyroxine (SYNTHROID, LEVOTHROID) 50 MCG tablet TAKE 1 TABLET (50 MCG TOTAL) BY MOUTH DAILY. 05/15/16  Yes Marletta Lor, MD  loratadine (CLARITIN) 10 MG tablet Take 10 mg by mouth daily.     Yes Historical Provider, MD  methadone (DOLOPHINE) 5 MG tablet Take 7.5 mg by mouth daily.    Yes Historical Provider, MD  Multiple Vitamin (MULTIVITAMIN) tablet Take 1 tablet by mouth daily.     Yes Historical Provider, MD    Family History Family History  Problem Relation Age of Onset  . Dementia Mother   . Heart disease  Mother     smal vessel disease  . Hypertension Brother   . Colon cancer Neg Hx   . Colon polyps Neg Hx     Social History Social History  Substance Use Topics  . Smoking status: Never Smoker  . Smokeless tobacco: Never Used  . Alcohol use No     Allergies   Phenergan [promethazine hcl]   Review of Systems Review of Systems  All other systems reviewed and are negative.    Physical Exam Updated Vital Signs BP 125/74 (BP Location: Left Arm)   Pulse 98   Temp 100 F (37.8 C) (Oral)   Resp 18   Ht 5\' 3"  (1.6 m)   Wt 190 lb (86.2 kg)   SpO2 95%   BMI 33.66 kg/m   Physical Exam  Constitutional: She is oriented to person, place, and time. She appears  well-developed and well-nourished. No distress.  HENT:  Head: Normocephalic and atraumatic.  Mouth/Throat: Oropharynx is clear and moist.  Eyes: Conjunctivae and EOM are normal. Pupils are equal, round, and reactive to light.  Neck: Normal range of motion. Neck supple.  Cardiovascular: Normal rate, regular rhythm and intact distal pulses.   No murmur heard. Pulmonary/Chest: Effort normal and breath sounds normal. No respiratory distress. She has no wheezes. She has no rales.  Abdominal: Soft. She exhibits no distension. There is tenderness in the suprapubic area. There is no rebound and no guarding.    Minimal tenderness with palpation. No CVA tenderness  Musculoskeletal: Normal range of motion. She exhibits no edema or tenderness.  Neurological: She is alert and oriented to person, place, and time.  Skin: Skin is warm and dry. No rash noted. No erythema.  Psychiatric: She has a normal mood and affect. Her behavior is normal.  Nursing note and vitals reviewed.    ED Treatments / Results  Labs (all labs ordered are listed, but only abnormal results are displayed) Labs Reviewed  URINALYSIS, ROUTINE W REFLEX MICROSCOPIC (NOT AT [facility]) - Abnormal; Notable for the following:       Result Value   Hgb urine dipstick TRACE (*)    Leukocytes, UA LARGE (*)    All other components within normal limits  URINE MICROSCOPIC-ADD ON - Abnormal; Notable for the following:    Squamous Epithelial / LPF 0-5 (*)    Bacteria, UA MANY (*)    All other components within normal limits  URINE CULTURE    EKG  EKG Interpretation None       Radiology No results found.  Procedures Procedures (including critical care time)  Medications Ordered in ED Medications  cephALEXin (KEFLEX) capsule 500 mg (not administered)  acetaminophen (TYLENOL) tablet 650 mg (650 mg Oral Given 08/23/16 0943)     Initial Impression / Assessment and Plan / ED Course  I have reviewed the triage vital signs and the  nursing notes.  Pertinent labs & imaging results that were available during my care of the patient were reviewed by me and considered in my medical decision making (see chart for details).  Clinical Course   Patient presenting today with generalized body aches, fever of 100 and minimal suprapubic tenderness. Patient has normal vital signs on exam and normal mental status. No peritoneal findings on abdominal exam but minimal suprapubic tenderness. No abnormalities of the throat and voice is normal. Low suspicion for epiglottitis, RPA or PTA. Concern that patient potentially has a viral illness causing symptoms versus UTI as urine is suspicious for UTI with  large leukocytes and 6-30 white blood cells without evidence of contamination. Patient started on Keflex and urine culture sent.  Final Clinical Impressions(s) / ED Diagnoses   Final diagnoses:  UTI (lower urinary tract infection)  Fever, unspecified fever cause    New Prescriptions New Prescriptions   CEPHALEXIN (KEFLEX) 500 MG CAPSULE    Take 1 capsule (500 mg total) by mouth 2 (two) times daily.     Blanchie Dessert, MD 08/23/16 1051

## 2016-08-23 NOTE — ED Notes (Signed)
MD at bedside. 

## 2016-08-24 LAB — URINE CULTURE

## 2016-09-04 ENCOUNTER — Encounter: Payer: Self-pay | Admitting: Internal Medicine

## 2016-09-04 ENCOUNTER — Ambulatory Visit (INDEPENDENT_AMBULATORY_CARE_PROVIDER_SITE_OTHER): Payer: Medicare Other | Admitting: Internal Medicine

## 2016-09-04 VITALS — BP 130/80 | HR 76 | Temp 98.1°F | Resp 20 | Ht 63.0 in | Wt 190.4 lb

## 2016-09-04 DIAGNOSIS — Z87440 Personal history of urinary (tract) infections: Secondary | ICD-10-CM | POA: Diagnosis not present

## 2016-09-04 DIAGNOSIS — G2581 Restless legs syndrome: Secondary | ICD-10-CM

## 2016-09-04 DIAGNOSIS — M797 Fibromyalgia: Secondary | ICD-10-CM

## 2016-09-04 DIAGNOSIS — N9489 Other specified conditions associated with female genital organs and menstrual cycle: Secondary | ICD-10-CM | POA: Diagnosis not present

## 2016-09-04 DIAGNOSIS — R339 Retention of urine, unspecified: Secondary | ICD-10-CM | POA: Diagnosis not present

## 2016-09-04 DIAGNOSIS — R102 Pelvic and perineal pain: Secondary | ICD-10-CM

## 2016-09-04 LAB — POC URINALSYSI DIPSTICK (AUTOMATED)
Bilirubin, UA: NEGATIVE
Glucose, UA: NEGATIVE
Ketones, UA: NEGATIVE
Nitrite, UA: NEGATIVE
Protein, UA: NEGATIVE
Spec Grav, UA: 1.015
Urobilinogen, UA: 0.2
pH, UA: 7

## 2016-09-04 NOTE — Patient Instructions (Signed)
Call or return to clinic prn if these symptoms worsen or fail to improve as anticipated.

## 2016-09-04 NOTE — Progress Notes (Signed)
Subjective:    Patient ID: Grace Schmitt, female    DOB: 1946-11-16, 70 y.o.   MRN: [id number]  HPI  70 year old patient who is seen today in follow-up.  She was seen in the ED on August 27 with fever and acute urinary tract infection.  She apparently had some associated delirium.  She has improved with antibiotic therapy, but still having some vague lower abdominal discomfort.  Follow-up UA today was unremarkable.  She generally feels better Medical regimen does include nighttime dose of methadone for restless leg syndrome Her urinary frequency has resolved  Past Medical History:  Diagnosis Date  . Allergy    prn claritin  . Fibromyalgia   . Hx of colonic polyps serrated and adenomatous 05/28/2005  . Hyperlipidemia   . Ischemic chest pain ([place])   . PONV (postoperative nausea and vomiting)   . Restless leg syndrome   . Thyroid disease      Social History   Social History  . Marital status: Married    Spouse name: N/A  . Number of children: N/A  . Years of education: N/A   Occupational History  . Not on file.   Social History Main Topics  . Smoking status: Never Smoker  . Smokeless tobacco: Never Used  . Alcohol use No  . Drug use: No  . Sexual activity: Not Currently   Other Topics Concern  . Not on file   Social History Narrative  . No narrative on file    Past Surgical History:  Procedure Laterality Date  . COLONOSCOPY  October 2011  . TONSILLECTOMY  1951  . TUBAL LIGATION  1978   x2    Family History  Problem Relation Age of Onset  . Dementia Mother   . Heart disease Mother     smal vessel disease  . Hypertension Brother   . Colon cancer Neg Hx   . Colon polyps Neg Hx     Allergies  Allergen Reactions  . Phenergan [Promethazine Hcl] Other (See Comments)    Pt has restless leg syndrome and the phenergan cause the RLS  to worsen     Current Outpatient Prescriptions on File Prior to Visit  Medication Sig Dispense Refill  . aspirin 81 MG  tablet Take 81 mg by mouth daily.      Marland Kitchen aspirin-acetaminophen-caffeine (EXCEDRIN MIGRAINE) 250-250-65 MG tablet Take 2 tablets by mouth daily as needed (morning pain).    . Azelaic Acid 15 % cream After skin is thoroughly washed and patted dry, gently but thoroughly massage a thin film of azelaic acid cream into the affected area twice daily, in the morning and evening. 50 g 4  . Calcium Carbonate-Vitamin D (CALCIUM 600+D) 600-400 MG-UNIT per tablet Take 2 tablets by mouth daily.     Marland Kitchen co-enzyme Q-10 (CO Q 10) 30 MG capsule Take 60 mg by mouth daily.      . Glucosamine-Chondroit-Vit C-Mn (GLUCOSAMINE-CHONDROITIN) CAPS Take 1 capsule by mouth daily.      Marland Kitchen levothyroxine (SYNTHROID, LEVOTHROID) 50 MCG tablet TAKE 1 TABLET (50 MCG TOTAL) BY MOUTH DAILY. 90 tablet 2  . loratadine (CLARITIN) 10 MG tablet Take 10 mg by mouth daily.      . methadone (DOLOPHINE) 5 MG tablet Take 7.5 mg by mouth daily.     . Multiple Vitamin (MULTIVITAMIN) tablet Take 1 tablet by mouth daily.       No current facility-administered medications on file prior to visit.     BP 130/80 (  BP Location: Left Arm, Patient Position: Sitting, Cuff Size: Normal)   Pulse 76   Temp 98.1 F (36.7 C) (Oral)   Resp 20   Ht 5\' 3"  (1.6 m)   Wt 190 lb 6.1 oz (86.4 kg)   SpO2 97%   BMI 33.72 kg/m     Review of Systems  Constitutional: Negative.   HENT: Negative for congestion, dental problem, hearing loss, rhinorrhea, sinus pressure, sore throat and tinnitus.   Eyes: Negative for pain, discharge and visual disturbance.  Respiratory: Negative for cough and shortness of breath.   Cardiovascular: Negative for chest pain, palpitations and leg swelling.  Gastrointestinal: Positive for abdominal pain. Negative for abdominal distention, blood in stool, constipation, diarrhea, nausea and vomiting.  Genitourinary: Positive for dyspareunia. Negative for difficulty urinating, dysuria, flank pain, frequency, hematuria, pelvic pain, urgency,  vaginal bleeding, vaginal discharge and vaginal pain.  Musculoskeletal: Negative for arthralgias, gait problem and joint swelling.  Skin: Negative for rash.  Neurological: Negative for dizziness, syncope, speech difficulty, weakness, numbness and headaches.  Hematological: Negative for adenopathy.  Psychiatric/Behavioral: Negative for agitation, behavioral problems and dysphoric mood. The patient is not nervous/anxious.        Objective:   Physical Exam  Constitutional: She is oriented to person, place, and time. She appears well-developed and well-nourished.  HENT:  Head: Normocephalic.  Right Ear: External ear normal.  Left Ear: External ear normal.  Mouth/Throat: Oropharynx is clear and moist.  Eyes: Conjunctivae and EOM are normal. Pupils are equal, round, and reactive to light.  Neck: Normal range of motion. Neck supple. No thyromegaly present.  Cardiovascular: Normal rate, regular rhythm, normal heart sounds and intact distal pulses.   Pulmonary/Chest: Effort normal and breath sounds normal.  Abdominal: Soft. Bowel sounds are normal. She exhibits no distension and no mass. There is no tenderness. There is no rebound and no guarding.  Musculoskeletal: Normal range of motion.  Lymphadenopathy:    She has no cervical adenopathy.  Neurological: She is alert and oriented to person, place, and time.  Skin: Skin is warm and dry. No rash noted.  Psychiatric: She has a normal mood and affect. Her behavior is normal.          Assessment & Plan:   Status post UTI Nonspecific lower abdominal discomfort heard.  We'll clinically observe History of fibromyalgia History of RLS  [place]

## 2016-09-04 NOTE — Progress Notes (Signed)
Pre visit review using our clinic review tool, if applicable. No additional management support is needed unless otherwise documented below in the visit note. 

## 2016-09-07 ENCOUNTER — Inpatient Hospital Stay: Admission: RE | Admit: 2016-09-07 | Payer: Medicare Other | Source: Ambulatory Visit

## 2016-09-09 ENCOUNTER — Ambulatory Visit (INDEPENDENT_AMBULATORY_CARE_PROVIDER_SITE_OTHER)
Admission: RE | Admit: 2016-09-09 | Discharge: 2016-09-09 | Disposition: A | Discharge status: Home / Self Care | Payer: Medicare Other | Source: Ambulatory Visit | Attending: Internal Medicine | Admitting: Internal Medicine

## 2016-09-09 DIAGNOSIS — Z8739 Personal history of other diseases of the musculoskeletal system and connective tissue: Secondary | ICD-10-CM

## 2016-09-09 DIAGNOSIS — M8580 Other specified disorders of bone density and structure, unspecified site: Secondary | ICD-10-CM

## 2016-09-16 DIAGNOSIS — Z124 Encounter for screening for malignant neoplasm of cervix: Secondary | ICD-10-CM | POA: Diagnosis not present

## 2016-09-16 DIAGNOSIS — Z01419 Encounter for gynecological examination (general) (routine) without abnormal findings: Secondary | ICD-10-CM | POA: Diagnosis not present

## 2016-10-20 ENCOUNTER — Emergency Department (HOSPITAL_BASED_OUTPATIENT_CLINIC_OR_DEPARTMENT_OTHER)
Admission: EM | Admit: 2016-10-20 | Discharge: 2016-10-20 | Disposition: A | Discharge status: Home / Self Care | Payer: Medicare Other | Attending: Emergency Medicine | Admitting: Emergency Medicine

## 2016-10-20 ENCOUNTER — Emergency Department (HOSPITAL_BASED_OUTPATIENT_CLINIC_OR_DEPARTMENT_OTHER): Payer: Medicare Other

## 2016-10-20 ENCOUNTER — Encounter (HOSPITAL_BASED_OUTPATIENT_CLINIC_OR_DEPARTMENT_OTHER): Payer: Self-pay | Admitting: Emergency Medicine

## 2016-10-20 DIAGNOSIS — Z7901 Long term (current) use of anticoagulants: Secondary | ICD-10-CM | POA: Insufficient documentation

## 2016-10-20 DIAGNOSIS — R110 Nausea: Secondary | ICD-10-CM | POA: Diagnosis not present

## 2016-10-20 DIAGNOSIS — E039 Hypothyroidism, unspecified: Secondary | ICD-10-CM | POA: Insufficient documentation

## 2016-10-20 DIAGNOSIS — R509 Fever, unspecified: Secondary | ICD-10-CM | POA: Insufficient documentation

## 2016-10-20 DIAGNOSIS — M797 Fibromyalgia: Secondary | ICD-10-CM | POA: Diagnosis not present

## 2016-10-20 DIAGNOSIS — R51 Headache: Secondary | ICD-10-CM | POA: Diagnosis not present

## 2016-10-20 DIAGNOSIS — R0989 Other specified symptoms and signs involving the circulatory and respiratory systems: Secondary | ICD-10-CM | POA: Diagnosis not present

## 2016-10-20 LAB — COMPREHENSIVE METABOLIC PANEL
ALT: 14 U/L (ref 14–54)
AST: 21 U/L (ref 15–41)
Albumin: 3.6 g/dL (ref 3.5–5.0)
Alkaline Phosphatase: 69 U/L (ref 38–126)
Anion gap: 9 (ref 5–15)
BUN: 19 mg/dL (ref 6–20)
CO2: 25 mmol/L (ref 22–32)
Calcium: 9 mg/dL (ref 8.9–10.3)
Chloride: 101 mmol/L (ref 101–111)
Creatinine, Ser: 0.7 mg/dL (ref 0.44–1.00)
GFR calc Af Amer: >60 mL/min (ref >60)
GFR calc non Af Amer: >60 mL/min (ref >60)
Glucose, Bld: 100 mg/dL — ABNORMAL HIGH (ref 65–99)
Potassium: 4.1 mmol/L (ref 3.5–5.1)
Sodium: 135 mmol/L (ref 135–145)
Total Bilirubin: 0.4 mg/dL (ref 0.3–1.2)
Total Protein: 7.5 g/dL (ref 6.5–8.1)

## 2016-10-20 LAB — URINALYSIS, ROUTINE W REFLEX MICROSCOPIC
Bilirubin Urine: NEGATIVE
Glucose, UA: NEGATIVE mg/dL
Ketones, ur: NEGATIVE mg/dL
Leukocytes, UA: NEGATIVE
Nitrite: NEGATIVE
Protein, ur: NEGATIVE mg/dL
Specific Gravity, Urine: 1.016 (ref 1.005–1.030)
pH: 7 (ref 5.0–8.0)

## 2016-10-20 LAB — CBC WITH DIFFERENTIAL/PLATELET
Basophils Absolute: 0 10*3/uL (ref 0.0–0.1)
Basophils Relative: 0 %
Eosinophils Absolute: 0.1 10*3/uL (ref 0.0–0.7)
Eosinophils Relative: 1 %
HCT: 38.3 % (ref 36.0–46.0)
Hemoglobin: 12.7 g/dL (ref 12.0–15.0)
Lymphocytes Relative: 11 %
Lymphs Abs: 1.2 10*3/uL (ref 0.7–4.0)
MCH: 30.4 pg (ref 26.0–34.0)
MCHC: 33.2 g/dL (ref 30.0–36.0)
MCV: 91.6 fL (ref 78.0–100.0)
Monocytes Absolute: 0.8 10*3/uL (ref 0.1–1.0)
Monocytes Relative: 7 %
Neutro Abs: 8.5 10*3/uL — ABNORMAL HIGH (ref 1.7–7.7)
Neutrophils Relative %: 81 %
Platelets: 333 10*3/uL (ref 150–400)
RBC: 4.18 MIL/uL (ref 3.87–5.11)
RDW: 12.9 % (ref 11.5–15.5)
WBC: 10.7 10*3/uL — ABNORMAL HIGH (ref 4.0–10.5)

## 2016-10-20 LAB — URINE MICROSCOPIC-ADD ON

## 2016-10-20 LAB — LIPASE, BLOOD: Lipase: 16 U/L (ref 11–51)

## 2016-10-20 LAB — I-STAT CG4 LACTIC ACID, ED: Lactic Acid, Venous: 1.22 mmol/L (ref 0.5–1.9)

## 2016-10-20 MED ORDER — SODIUM CHLORIDE 0.9 % IV BOLUS (SEPSIS)
500.0000 mL | Freq: Once | INTRAVENOUS | Status: AC
  Administered 2016-10-20: 500 mL via INTRAVENOUS

## 2016-10-20 MED ORDER — ONDANSETRON 4 MG PO TBDP: 4.0000 mg | ORAL_TABLET | Freq: Three times a day (TID) | ORAL | 1 refills | Status: DC | PRN

## 2016-10-20 MED ORDER — ONDANSETRON HCL 4 MG/2ML IJ SOLN
4.0000 mg | Freq: Once | INTRAMUSCULAR | Status: AC
  Administered 2016-10-20: 4 mg via INTRAVENOUS
  Filled 2016-10-20: qty 2

## 2016-10-20 MED ORDER — SODIUM CHLORIDE 0.9 % IV SOLN
INTRAVENOUS | Status: DC
  Administered 2016-10-20: 13:00:00 via INTRAVENOUS

## 2016-10-20 MED ORDER — HYDROCODONE-ACETAMINOPHEN 5-325 MG PO TABS: 1.0000 | ORAL_TABLET | Freq: Four times a day (QID) | ORAL | 0 refills | Status: DC | PRN

## 2016-10-20 MED ORDER — HYDROMORPHONE HCL 1 MG/ML IJ SOLN
1.0000 mg | Freq: Once | INTRAMUSCULAR | Status: AC
  Administered 2016-10-20: 1 mg via INTRAVENOUS
  Filled 2016-10-20: qty 1

## 2016-10-20 MED FILL — HYDROCODON-APAP 5-325: 5-325 | 2 days supply | Qty: 20 | Fill #0

## 2016-10-20 MED FILL — ONDANSETRON ODT 4 MG TABLET: 4 | 4 days supply | Qty: 12 | Fill #0 | Status: TO

## 2016-10-20 NOTE — ED Provider Notes (Signed)
[place] DEPT [facility] Provider Note   CSN: [id number] Arrival date & time: 10/20/16  1006     History   Chief Complaint Chief Complaint  Patient presents with  . Muscle Pain    HPI Grace Schmitt is a 70 y.o. female.  Patient with a known history of fibromyalgia. Patient with generalized body aches consistent with that. The patient also with headache. States earlier today she had a temperature to 100.8. No other real symptoms. No nausea no vomiting no visual changes no chest pain no shortness of breath no abdominal pain some nausea but no vomiting or diarrhea. No dysuria. Patient has had asymptomatic urinary tract infections in the past      Past Medical History:  Diagnosis Date  . Allergy    prn claritin  . Fibromyalgia   . Hx of colonic polyps serrated and adenomatous 05/28/2005  . Hyperlipidemia   . Ischemic chest pain ([place])   . PONV (postoperative nausea and vomiting)   . Restless leg syndrome   . Thyroid disease     Patient Active Problem List   Diagnosis Date Noted  . Osteopenia 06/05/2016  . Restless leg syndrome 03/31/2016  . Fibromyalgia 03/31/2016  . Hyperlipidemia 01/17/2016  . Exertional chest pain   . Chest pain at rest 01/15/2016  . Depression 01/15/2016  . Hypothyroidism 06/04/2014  . Hx of colonic polyps serrated and adenomatous 05/28/2005    Past Surgical History:  Procedure Laterality Date  . COLONOSCOPY  October 2011  . TONSILLECTOMY  1951  . TUBAL LIGATION  1978   x2    OB History    No data available       Home Medications    Prior to Admission medications   Medication Sig Start Date End Date Taking? Authorizing Provider  aspirin 81 MG tablet Take 81 mg by mouth daily.      Historical Provider, MD  aspirin-acetaminophen-caffeine (EXCEDRIN MIGRAINE) [phone] MG tablet Take 2 tablets by mouth daily as needed (morning pain).    Historical Provider, MD  Azelaic Acid 15 % cream After skin is thoroughly washed and patted dry,  gently but thoroughly massage a thin film of azelaic acid cream into the affected area twice daily, in the morning and evening. 04/30/15   Marletta Lor, MD  Calcium Carbonate-Vitamin D (CALCIUM 600+D) 600-400 MG-UNIT per tablet Take 2 tablets by mouth daily.     Historical Provider, MD  co-enzyme Q-10 (CO Q 10) 30 MG capsule Take 60 mg by mouth daily.      Historical Provider, MD  Glucosamine-Chondroit-Vit C-Mn (GLUCOSAMINE-CHONDROITIN) CAPS Take 1 capsule by mouth daily.      Historical Provider, MD  HYDROcodone-acetaminophen (NORCO/VICODIN) 5-325 MG tablet Take 1-2 tablets by mouth every 6 (six) hours as needed for moderate pain. 10/20/16   Fredia Sorrow, MD  levothyroxine (SYNTHROID, LEVOTHROID) 50 MCG tablet TAKE 1 TABLET (50 MCG TOTAL) BY MOUTH DAILY. 05/15/16   Marletta Lor, MD  loratadine (CLARITIN) 10 MG tablet Take 10 mg by mouth daily.      Historical Provider, MD  methadone (DOLOPHINE) 5 MG tablet Take 7.5 mg by mouth daily.     Historical Provider, MD  Multiple Vitamin (MULTIVITAMIN) tablet Take 1 tablet by mouth daily.      Historical Provider, MD  ondansetron (ZOFRAN ODT) 4 MG disintegrating tablet Take 1 tablet (4 mg total) by mouth every 8 (eight) hours as needed for nausea or vomiting. 10/20/16   Fredia Sorrow, MD  Family History Family History  Problem Relation Age of Onset  . Dementia Mother   . Heart disease Mother     smal vessel disease  . Hypertension Brother   . Colon cancer Neg Hx   . Colon polyps Neg Hx     Social History Social History  Substance Use Topics  . Smoking status: Never Smoker  . Smokeless tobacco: Never Used  . Alcohol use No     Allergies   Phenergan [promethazine hcl]   Review of Systems Review of Systems  Constitutional: Positive for fever.  HENT: Negative for congestion.   Eyes: Negative for visual disturbance.  Respiratory: Negative for cough and shortness of breath.   Cardiovascular: Negative for chest pain.    Gastrointestinal: Positive for nausea. Negative for abdominal pain, diarrhea and vomiting.  Genitourinary: Negative for dysuria.  Musculoskeletal: Positive for myalgias.  Skin: Negative for rash.  Neurological: Positive for headaches.  Hematological: Does not bruise/bleed easily.  Psychiatric/Behavioral: Negative for confusion.     Physical Exam Updated Vital Signs BP 122/65   Pulse 88   Temp 99.7 F (37.6 C) (Oral)   Resp 16   Ht 5\' 2"  (1.575 m)   Wt 86.2 kg   SpO2 97%   BMI 34.75 kg/m   Physical Exam  Constitutional: She is oriented to person, place, and time. She appears well-developed and well-nourished. No distress.  HENT:  Head: Normocephalic and atraumatic.  Mouth/Throat: Oropharynx is clear and moist.  Eyes: EOM are normal. Pupils are equal, round, and reactive to light.  Neck: Normal range of motion. Neck supple.  Cardiovascular: Normal rate, regular rhythm and normal heart sounds.   Pulmonary/Chest: Effort normal and breath sounds normal. No respiratory distress.  Abdominal: Soft. Bowel sounds are normal. There is no tenderness.  Genitourinary: Vagina normal and uterus normal.  Musculoskeletal: Normal range of motion.  Neurological: She is alert and oriented to person, place, and time. No cranial nerve deficit.  Skin: Skin is warm.  Nursing note and vitals reviewed.    ED Treatments / Results  Labs (all labs ordered are listed, but only abnormal results are displayed) Labs Reviewed  COMPREHENSIVE METABOLIC PANEL - Abnormal; Notable for the following:       Result Value   Glucose, Bld 100 (*)    All other components within normal limits  CBC WITH DIFFERENTIAL/PLATELET - Abnormal; Notable for the following:    WBC 10.7 (*)    Neutro Abs 8.5 (*)    All other components within normal limits  URINALYSIS, ROUTINE W REFLEX MICROSCOPIC (NOT AT [facility]) - Abnormal; Notable for the following:    Hgb urine dipstick TRACE (*)    All other components within normal  limits  URINE MICROSCOPIC-ADD ON - Abnormal; Notable for the following:    Squamous Epithelial / LPF 0-5 (*)    Bacteria, UA FEW (*)    All other components within normal limits  LIPASE, BLOOD  I-STAT CG4 LACTIC ACID, ED    EKG  EKG Interpretation None       Radiology Dg Chest 2 View  Result Date: 10/20/2016 CLINICAL DATA:  Headache and fever over the last week. EXAM: CHEST  2 VIEW COMPARISON:  Two-view chest x-ray 01/15/2016 FINDINGS: Heart size is normal. Pulmonary vascular congestion is present without frank edema. There is chronic interstitial coarsening. No focal airspace disease is present. Lungs are hyperexpanded. The visualized soft tissues and bony thorax are unremarkable. IMPRESSION: 1. Pulmonary vascular congestion without frank edema. 2.  No focal airspace disease. 3. Emphysema. Electronically Signed   By: San Morelle M.D.   On: 10/20/2016 12:34   Ct Head Wo Contrast  Result Date: 10/20/2016 CLINICAL DATA:  Headache for 2 weeks.  Fever and nausea. EXAM: CT HEAD WITHOUT CONTRAST TECHNIQUE: Contiguous axial images were obtained from the base of the skull through the vertex without intravenous contrast. COMPARISON:  None FINDINGS: Brain: The brainstem, cerebellum, cerebral peduncles, thalami, basal ganglia, basilar cisterns, and ventricular system appear within normal limits. Periventricular white matter and corona radiata hypodensities favor chronic ischemic microvascular white matter disease. No intracranial hemorrhage, mass lesion, or acute CVA. Vascular: Unremarkable Skull: Unremarkable Sinuses/Orbits: Left mastoid effusion. Other: No supplemental non-categorized findings. IMPRESSION: 1. No acute intracranial findings. 2. Left mastoid effusion. 3. Periventricular white matter and corona radiata hypodensities favor chronic ischemic microvascular white matter disease. Electronically Signed   By: Van Clines M.D.   On: 10/20/2016 12:32    Procedures Procedures  (including critical care time)  Medications Ordered in ED Medications  0.9 %  sodium chloride infusion ( Intravenous Stopped 10/20/16 1444)  sodium chloride 0.9 % bolus 500 mL (0 mLs Intravenous Stopped 10/20/16 1444)  ondansetron (ZOFRAN) injection 4 mg (4 mg Intravenous Given 10/20/16 1232)  HYDROmorphone (DILAUDID) injection 1 mg (1 mg Intravenous Given 10/20/16 1232)     Initial Impression / Assessment and Plan / ED Course  I have reviewed the triage vital signs and the nursing notes.  Pertinent labs & imaging results that were available during my care of the patient were reviewed by me and considered in my medical decision making (see chart for details).  Clinical Course    Patient with a history of fibromyalgia. Symptoms a little more worsened severe than usual. Patient has had asymptomatic UTIs in the past. Patient states that she had a fever to 100.8. Based on this patient underwent workup to look for any other possible causes for the headache and fever. But everything came up negative. No evidence urinary tract infection. Chest x-ray without evidence of pneumonia. Head CT was negative. Patient improved with antinausea medicine pain medicine. Think symptoms predominantly related to her fibromyalgia. Patient has follow-up available with her primary care doctor.  Final Clinical Impressions(s) / ED Diagnoses   Final diagnoses:  Fibromyalgia    New Prescriptions New Prescriptions   HYDROCODONE-ACETAMINOPHEN (NORCO/VICODIN) 5-325 MG TABLET    Take 1-2 tablets by mouth every 6 (six) hours as needed for moderate pain.   ONDANSETRON (ZOFRAN ODT) 4 MG DISINTEGRATING TABLET    Take 1 tablet (4 mg total) by mouth every 8 (eight) hours as needed for nausea or vomiting.     Fredia Sorrow, MD 10/20/16 [phone]

## 2016-10-20 NOTE — Discharge Instructions (Signed)
Workup without any significant findings. Take pain medicine and antinausea medicine as directed. Make an appointment to follow-up with your doctor. Return for any new or worse symptoms.

## 2016-10-20 NOTE — ED Notes (Signed)
Patient transported to X-ray 

## 2016-12-11 ENCOUNTER — Ambulatory Visit
Admission: RE | Admit: 2016-12-11 | Discharge: 2016-12-11 | Disposition: A | Discharge status: Home / Self Care | Payer: Medicare Other | Source: Ambulatory Visit | Attending: Orthopedic Surgery | Admitting: Orthopedic Surgery

## 2016-12-11 ENCOUNTER — Other Ambulatory Visit: Payer: Self-pay | Admitting: Orthopedic Surgery

## 2016-12-11 DIAGNOSIS — M25511 Pain in right shoulder: Secondary | ICD-10-CM | POA: Diagnosis not present

## 2016-12-11 DIAGNOSIS — J984 Other disorders of lung: Secondary | ICD-10-CM | POA: Diagnosis not present

## 2016-12-11 DIAGNOSIS — L905 Scar conditions and fibrosis of skin: Secondary | ICD-10-CM

## 2016-12-11 DIAGNOSIS — M25561 Pain in right knee: Secondary | ICD-10-CM | POA: Diagnosis not present

## 2016-12-25 ENCOUNTER — Ambulatory Visit (INDEPENDENT_AMBULATORY_CARE_PROVIDER_SITE_OTHER): Payer: Medicare Other | Admitting: Family Medicine

## 2016-12-25 ENCOUNTER — Encounter: Payer: Self-pay | Admitting: Family Medicine

## 2016-12-25 VITALS — BP 109/74 | HR 96 | Temp 98.6°F | Resp 20 | Wt 186.0 lb

## 2016-12-25 DIAGNOSIS — J0100 Acute maxillary sinusitis, unspecified: Secondary | ICD-10-CM | POA: Diagnosis not present

## 2016-12-25 MED ORDER — AMOXICILLIN-POT CLAVULANATE 875-125 MG PO TABS: 1.0000 | ORAL_TABLET | Freq: Two times a day (BID) | ORAL | 0 refills | Status: DC

## 2016-12-25 NOTE — Patient Instructions (Addendum)
Rest, hydrate. Use flonase and mucinex (plain) Start Augmentin today.  It was a pleasure meeting you today.    You have a sinus infection.    Sinusitis, Adult Sinusitis is soreness and inflammation of your sinuses. Sinuses are hollow spaces in the bones around your face. They are located:  Around your eyes.  In the middle of your forehead.  Behind your nose.  In your cheekbones. Your sinuses and nasal passages are lined with a stringy fluid (mucus). Mucus normally drains out of your sinuses. When your nasal tissues get inflamed or swollen, the mucus can get trapped or blocked so air cannot flow through your sinuses. This lets bacteria, viruses, and funguses grow, and that leads to infection. Follow these instructions at home: Medicines  Take, use, or apply over-the-counter and prescription medicines only as told by your doctor. These may include nasal sprays.  If you were prescribed an antibiotic medicine, take it as told by your doctor. Do not stop taking the antibiotic even if you start to feel better. Hydrate and Humidify  Drink enough water to keep your pee (urine) clear or pale yellow.  Use a cool mist humidifier to keep the humidity level in your home above 50%.  Breathe in steam for 10-15 minutes, 3-4 times a day or as told by your doctor. You can do this in the bathroom while a hot shower is running.  Try not to spend time in cool or dry air. Rest  Rest as much as possible.  Sleep with your head raised (elevated).  Make sure to get enough sleep each night. General instructions  Put a warm, moist washcloth on your face 3-4 times a day or as told by your doctor. This will help with discomfort.  Wash your hands often with soap and water. If there is no soap and water, use hand sanitizer.  Do not smoke. Avoid being around people who are smoking (secondhand smoke).  Keep all follow-up visits as told by your doctor. This is important. Contact a doctor if:  You  have a fever.  Your symptoms get worse.  Your symptoms do not get better within 10 days. Get help right away if:  You have a very bad headache.  You cannot stop throwing up (vomiting).  You have pain or swelling around your face or eyes.  You have trouble seeing.  You feel confused.  Your neck is stiff.  You have trouble breathing. This information is not intended to replace advice given to you by your health care provider. Make sure you discuss any questions you have with your health care provider. Document Released: 06/01/2008 Document Revised: 08/09/2016 Document Reviewed: 10/09/2015 Elsevier Interactive Patient Education  2017 [employer].

## 2016-12-25 NOTE — Progress Notes (Signed)
Grace Schmitt , 1946-10-03, 70 y.o., female MRN: [id number] Patient Care Team    Relationship Specialty Notifications Start End  Marletta Lor, MD PCP - General   12/10/10     CC: Cough Subjective: Pt presents for an acute OV with complaints of cough of greater than 3 days duration.  Associated symptoms include sore throat, nasal congestion, bloody nose, facial sinus and eye pressure, hoarseness. He states the right side of her face around her maxillary sinuses probably including her right eye. She denies fever, chills, nausea, vomiting or diarrhea. Patient reports that she has tried Chloraseptic, salt water and even baby aspirin to help with her symptoms.  Allergies  Allergen Reactions  . Phenergan [Promethazine Hcl] Other (See Comments)    Pt has restless leg syndrome and the phenergan cause the RLS  to worsen    Social History  Substance Use Topics  . Smoking status: Never Smoker  . Smokeless tobacco: Never Used  . Alcohol use No   Past Medical History:  Diagnosis Date  . Allergy    prn claritin  . Fibromyalgia   . Hx of colonic polyps serrated and adenomatous 05/28/2005  . Hyperlipidemia   . Ischemic chest pain ([place])   . PONV (postoperative nausea and vomiting)   . Restless leg syndrome   . Thyroid disease    Past Surgical History:  Procedure Laterality Date  . COLONOSCOPY  October 2011  . TONSILLECTOMY  1951  . TUBAL LIGATION  1978   x2   Family History  Problem Relation Age of Onset  . Dementia Mother   . Heart disease Mother     smal vessel disease  . Hypertension Brother   . Colon cancer Neg Hx   . Colon polyps Neg Hx    Allergies as of 12/25/2016      Reactions   Phenergan [promethazine Hcl] Other (See Comments)   Pt has restless leg syndrome and the phenergan cause the RLS  to worsen       Medication List       Accurate as of 12/25/16  1:44 PM. Always use your most recent med list.          aspirin 81 MG tablet Take 81 mg by mouth  daily.   aspirin-acetaminophen-caffeine 250-250-65 MG tablet Commonly known as:  EXCEDRIN MIGRAINE Take 2 tablets by mouth daily as needed (morning pain).   Azelaic Acid 15 % cream After skin is thoroughly washed and patted dry, gently but thoroughly massage a thin film of azelaic acid cream into the affected area twice daily, in the morning and evening.   CALCIUM 600+D 600-400 MG-UNIT tablet Generic drug:  Calcium Carbonate-Vitamin D Take 2 tablets by mouth daily.   CO Q 10 30 MG capsule Generic drug:  co-enzyme Q-10 Take 60 mg by mouth daily.   DULoxetine 30 MG capsule Commonly known as:  CYMBALTA Take by mouth.   Glucosamine-Chondroitin Caps Take 1 capsule by mouth daily.   HYDROcodone-acetaminophen 5-325 MG tablet Commonly known as:  NORCO/VICODIN Take 1-2 tablets by mouth every 6 (six) hours as needed for moderate pain.   levothyroxine 50 MCG tablet Commonly known as:  SYNTHROID, LEVOTHROID TAKE 1 TABLET (50 MCG TOTAL) BY MOUTH DAILY.   loratadine 10 MG tablet Commonly known as:  CLARITIN Take 10 mg by mouth daily.   methadone 5 MG tablet Commonly known as:  DOLOPHINE Take 7.5 mg by mouth daily.   multivitamin tablet Take 1 tablet by mouth  daily.   ondansetron 4 MG disintegrating tablet Commonly known as:  ZOFRAN ODT Take 1 tablet (4 mg total) by mouth every 8 (eight) hours as needed for nausea or vomiting.       No results found for this or any previous visit (from the past 24 hour(s)). No results found.   ROS: Negative, with the exception of above mentioned in HPI   Objective:  BP 109/74 (BP Location: Left Arm, Patient Position: Sitting, Cuff Size: Large)   Pulse 96   Temp 98.6 F (37 C)   Resp 20   Wt 186 lb (84.4 kg)   SpO2 97%   BMI 34.02 kg/m  Body mass index is 34.02 kg/m. Gen: Afebrile. No acute distress. Nontoxic in appearance, well developed, well nourished.  HENT: AT. [place]. Bilateral TM visualized Bilateral air-fluid levels. MMM, no  oral lesions. Bilateral nares erythema, drainage. Throat without erythema or exudates. Postnasal drip, tender to maxillary sinus, cough present, hoarseness present Eyes:Pupils Equal Round Reactive to light, Extraocular movements intact,  Conjunctiva without redness, discharge or icterus. Neck/lymp/endocrine: Supple, bilateral anterior cervical lymphadenopathy CV: RRR  Chest: CTAB, no wheeze or crackles. Good air movement, normal resp effort.    Assessment/Plan: Grace Schmitt is a 70 y.o. female present for acute OV for  Acute maxillary sinusitis, recurrence not specified Rest, hydrate, Flonase and plain Mucinex use. Nasal saline irrigation. Augmentin twice a day 10 days. Follow-up when necessary, or sooner if symptoms are worsening, or are not resolved in 1-2 weeks.    electronically signed by:  Howard Pouch, DO  [place]

## 2016-12-28 DIAGNOSIS — D869 Sarcoidosis, unspecified: Secondary | ICD-10-CM | POA: Insufficient documentation

## 2017-01-31 ENCOUNTER — Other Ambulatory Visit: Payer: Self-pay | Admitting: Internal Medicine

## 2017-02-12 DIAGNOSIS — G2581 Restless legs syndrome: Secondary | ICD-10-CM | POA: Diagnosis not present

## 2017-02-12 DIAGNOSIS — M797 Fibromyalgia: Secondary | ICD-10-CM | POA: Diagnosis not present

## 2017-05-28 ENCOUNTER — Other Ambulatory Visit: Payer: Self-pay | Admitting: Internal Medicine

## 2017-05-28 DIAGNOSIS — Z1231 Encounter for screening mammogram for malignant neoplasm of breast: Secondary | ICD-10-CM

## 2017-06-11 ENCOUNTER — Ambulatory Visit
Admission: RE | Admit: 2017-06-11 | Discharge: 2017-06-11 | Disposition: A | Discharge status: Home / Self Care | Payer: Medicare Other | Source: Ambulatory Visit | Attending: Internal Medicine | Admitting: Internal Medicine

## 2017-06-11 DIAGNOSIS — Z1231 Encounter for screening mammogram for malignant neoplasm of breast: Secondary | ICD-10-CM

## 2017-06-29 ENCOUNTER — Encounter: Payer: Self-pay | Admitting: Internal Medicine

## 2017-06-29 ENCOUNTER — Telehealth: Payer: Self-pay

## 2017-06-29 ENCOUNTER — Ambulatory Visit (INDEPENDENT_AMBULATORY_CARE_PROVIDER_SITE_OTHER): Payer: Medicare Other | Admitting: Internal Medicine

## 2017-06-29 VITALS — BP 124/72 | HR 89 | Temp 98.2°F | Resp 18 | Wt 174.8 lb

## 2017-06-29 DIAGNOSIS — E034 Atrophy of thyroid (acquired): Secondary | ICD-10-CM

## 2017-06-29 DIAGNOSIS — R110 Nausea: Secondary | ICD-10-CM | POA: Diagnosis not present

## 2017-06-29 DIAGNOSIS — M8580 Other specified disorders of bone density and structure, unspecified site: Secondary | ICD-10-CM

## 2017-06-29 DIAGNOSIS — E7800 Pure hypercholesterolemia, unspecified: Secondary | ICD-10-CM

## 2017-06-29 DIAGNOSIS — Z86010 Personal history of colonic polyps: Secondary | ICD-10-CM

## 2017-06-29 DIAGNOSIS — R300 Dysuria: Secondary | ICD-10-CM

## 2017-06-29 LAB — CBC WITH DIFFERENTIAL/PLATELET
Basophils Absolute: 0.1 10*3/uL (ref 0.0–0.1)
Basophils Relative: 1 % (ref 0.0–3.0)
Eosinophils Absolute: 0.1 10*3/uL (ref 0.0–0.7)
Eosinophils Relative: 1.9 % (ref 0.0–5.0)
HCT: 39.7 % (ref 36.0–46.0)
Hemoglobin: 13.2 g/dL (ref 12.0–15.0)
Lymphocytes Relative: 15.3 % (ref 12.0–46.0)
Lymphs Abs: 1.2 10*3/uL (ref 0.7–4.0)
MCHC: 33.2 g/dL (ref 30.0–36.0)
MCV: 90.4 fl (ref 78.0–100.0)
Monocytes Absolute: 0.5 10*3/uL (ref 0.1–1.0)
Monocytes Relative: 5.8 % (ref 3.0–12.0)
Neutro Abs: 6 10*3/uL (ref 1.4–7.7)
Neutrophils Relative %: 76 % (ref 43.0–77.0)
Platelets: 285 10*3/uL (ref 150.0–400.0)
RBC: 4.39 Mil/uL (ref 3.87–5.11)
RDW: 13.4 % (ref 11.5–15.5)
WBC: 7.8 10*3/uL (ref 4.0–10.5)

## 2017-06-29 LAB — LIPID PANEL
Cholesterol: 216 mg/dL — ABNORMAL HIGH (ref 0–200)
HDL: 57.3 mg/dL (ref >39.00)
LDL Cholesterol: 132 mg/dL — ABNORMAL HIGH (ref 0–99)
NonHDL: 158.41
Total CHOL/HDL Ratio: 4
Triglycerides: 130 mg/dL (ref 0.0–149.0)
VLDL: 26 mg/dL (ref 0.0–40.0)

## 2017-06-29 LAB — COMPREHENSIVE METABOLIC PANEL
ALT: 20 U/L (ref 0–35)
AST: 20 U/L (ref 0–37)
Albumin: 4.2 g/dL (ref 3.5–5.2)
Alkaline Phosphatase: 82 U/L (ref 39–117)
BUN: 25 mg/dL — ABNORMAL HIGH (ref 6–23)
CO2: 29 mEq/L (ref 19–32)
Calcium: 11 mg/dL — ABNORMAL HIGH (ref 8.4–10.5)
Chloride: 101 mEq/L (ref 96–112)
Creatinine, Ser: 0.8 mg/dL (ref 0.40–1.20)
GFR: 74.91 mL/min (ref >60.00)
Glucose, Bld: 115 mg/dL — ABNORMAL HIGH (ref 70–99)
Potassium: 4.3 mEq/L (ref 3.5–5.1)
Sodium: 138 mEq/L (ref 135–145)
Total Bilirubin: 0.5 mg/dL (ref 0.2–1.2)
Total Protein: 8 g/dL (ref 6.0–8.3)

## 2017-06-29 LAB — POC URINALSYSI DIPSTICK (AUTOMATED)
Bilirubin, UA: NEGATIVE
Blood, UA: NEGATIVE
Glucose, UA: NEGATIVE
Ketones, UA: NEGATIVE
Leukocytes, UA: NEGATIVE
Nitrite, UA: NEGATIVE
Protein, UA: NEGATIVE
Spec Grav, UA: 1.01 (ref 1.010–1.025)
Urobilinogen, UA: 0.2 E.U./dL
pH, UA: 6.5 (ref 5.0–8.0)

## 2017-06-29 LAB — TSH: TSH: 1.89 u[IU]/mL (ref 0.35–4.50)

## 2017-06-29 MED ORDER — ONDANSETRON 4 MG PO TBDP: 4.0000 mg | ORAL_TABLET | Freq: Three times a day (TID) | ORAL | 1 refills | Status: DC | PRN

## 2017-06-29 MED ORDER — ONDANSETRON 4 MG PO TBDP
4.0000 mg | ORAL_TABLET | Freq: Three times a day (TID) | ORAL | 1 refills | Status: DC | PRN
Start: 2017-06-29 — End: 2018-05-16

## 2017-06-29 NOTE — Telephone Encounter (Signed)
Okay for Zofran, which patient has used before.  She has been intolerant of Phenergan

## 2017-06-29 NOTE — Patient Instructions (Signed)
Return for your annual exam as scheduled  Report any new or worsening symptoms

## 2017-06-29 NOTE — Telephone Encounter (Signed)
Per [facility] there is a Level 2 Interaction warning between the Zofran you prescribed today and pt's current Methadone prescription. They would like confirmation that you would still like to prescribe the Zofran or if you would like to change it.  Dr. Raliegh Ip - Please advise. Thanks!

## 2017-06-29 NOTE — Progress Notes (Signed)
Subjective:    Patient ID: Grace Schmitt, female    DOB: Apr 09, 1946, 71 y.o.   MRN: [id number]  HPI  71 year old patient who is seen today with a number of concerns.  She has had some intermittent burning dysuria.  Her chief complaint is nausea and also a sense of being off balance.  She is followed   At [facility]  neurology due to severe restless leg syndrome.  She requires methadone for symptomatic relief.  She does have a history also of fibromyalgia  Past Medical History:  Diagnosis Date  . Allergy    prn claritin  . Fibromyalgia   . Hx of colonic polyps serrated and adenomatous 05/28/2005  . Hyperlipidemia   . Ischemic chest pain   . PONV (postoperative nausea and vomiting)   . Restless leg syndrome   . Thyroid disease      Social History   Social History  . Marital status: Married    Spouse name: N/A  . Number of children: N/A  . Years of education: N/A   Occupational History  . Not on file.   Social History Main Topics  . Smoking status: Never Smoker  . Smokeless tobacco: Never Used  . Alcohol use No  . Drug use: No  . Sexual activity: Not Currently   Other Topics Concern  . Not on file   Social History Narrative  . No narrative on file    Past Surgical History:  Procedure Laterality Date  . COLONOSCOPY  October 2011  . TONSILLECTOMY  1951  . TUBAL LIGATION  1978   x2    Family History  Problem Relation Age of Onset  . Dementia Mother   . Heart disease Mother        smal vessel disease  . Hypertension Brother   . Colon cancer Neg Hx   . Colon polyps Neg Hx   . Breast cancer Neg Hx     Allergies  Allergen Reactions  . Phenergan [Promethazine Hcl] Other (See Comments)    Pt has restless leg syndrome and the phenergan cause the RLS  to worsen     Current Outpatient Prescriptions on File Prior to Visit  Medication Sig Dispense Refill  . aspirin 81 MG tablet Take 81 mg by mouth daily.      Marland Kitchen aspirin-acetaminophen-caffeine (EXCEDRIN MIGRAINE)  250-250-65 MG tablet Take 2 tablets by mouth daily as needed (morning pain).    . Azelaic Acid 15 % cream After skin is thoroughly washed and patted dry, gently but thoroughly massage a thin film of azelaic acid cream into the affected area twice daily, in the morning and evening. 50 g 4  . Calcium Carbonate-Vitamin D (CALCIUM 600+D) 600-400 MG-UNIT per tablet Take 2 tablets by mouth daily.     Marland Kitchen co-enzyme Q-10 (CO Q 10) 30 MG capsule Take 60 mg by mouth daily.      . DULoxetine (CYMBALTA) 30 MG capsule Take by mouth.    . Glucosamine-Chondroit-Vit C-Mn (GLUCOSAMINE-CHONDROITIN) CAPS Take 1 capsule by mouth daily.      Marland Kitchen levothyroxine (SYNTHROID, LEVOTHROID) 50 MCG tablet TAKE 1 TABLET (50 MCG TOTAL) BY MOUTH DAILY. 90 tablet 2  . loratadine (CLARITIN) 10 MG tablet Take 10 mg by mouth daily.      . methadone (DOLOPHINE) 5 MG tablet Take 7.5 mg by mouth daily.     . Multiple Vitamin (MULTIVITAMIN) tablet Take 1 tablet by mouth daily.      Marland Kitchen amoxicillin-clavulanate (AUGMENTIN) 875-125 MG  tablet Take 1 tablet by mouth 2 (two) times daily. (Patient not taking: Reported on 06/29/2017) 20 tablet 0  . HYDROcodone-acetaminophen (NORCO/VICODIN) 5-325 MG tablet Take 1-2 tablets by mouth every 6 (six) hours as needed for moderate pain. (Patient not taking: Reported on 12/25/2016) 20 tablet 0   No current facility-administered medications on file prior to visit.     BP 124/72 (BP Location: Left Arm, Patient Position: Sitting, Cuff Size: Large)   Pulse 89   Temp 98.2 F (36.8 C) (Oral)   Resp 18   Wt 174 lb 12.8 oz (79.3 kg)   SpO2 95%   BMI 31.97 kg/m     Review of Systems  Constitutional: Positive for fatigue.  Gastrointestinal: Positive for nausea.  Genitourinary: Positive for difficulty urinating and frequency.  Musculoskeletal: Positive for gait problem.  Neurological: Positive for weakness.       Objective:   Physical Exam  Constitutional: She is oriented to person, place, and time. She  appears well-developed and well-nourished.  HENT:  Head: Normocephalic.  Right Ear: External ear normal.  Left Ear: External ear normal.  Mouth/Throat: Oropharynx is clear and moist.  Eyes: Conjunctivae and EOM are normal. Pupils are equal, round, and reactive to light.  Neck: Normal range of motion. Neck supple. No thyromegaly present.  Cardiovascular: Normal rate, regular rhythm, normal heart sounds and intact distal pulses.   Pulmonary/Chest: Effort normal and breath sounds normal.  Abdominal: Soft. Bowel sounds are normal. She exhibits no mass. There is no tenderness.  Musculoskeletal: Normal range of motion.  Lymphadenopathy:    She has no cervical adenopathy.  Neurological: She is alert and oriented to person, place, and time.  Skin: Skin is warm and dry. No rash noted.  Psychiatric: She has a normal mood and affect. Her behavior is normal.          Assessment & Plan:   Dysuria.  Urinalysis reviewed.  This was unremarkable Nausea.  Will treat symptomatically Restless leg syndrome  Patient has a physical scheduled later this month.  We'll check updated lab today and reassess at that time  [facility]

## 2017-06-29 NOTE — Telephone Encounter (Signed)
Spoke with Shawn @ [facility] and notified to continue with Zofran rx. Nothing further needed at this time.

## 2017-07-13 ENCOUNTER — Encounter: Payer: Self-pay | Admitting: Internal Medicine

## 2017-07-13 ENCOUNTER — Ambulatory Visit (INDEPENDENT_AMBULATORY_CARE_PROVIDER_SITE_OTHER): Payer: Medicare Other | Admitting: Internal Medicine

## 2017-07-13 VITALS — BP 140/78 | HR 78 | Temp 98.1°F | Resp 12 | Ht 62.0 in | Wt 176.0 lb

## 2017-07-13 DIAGNOSIS — Z1159 Encounter for screening for other viral diseases: Secondary | ICD-10-CM | POA: Diagnosis not present

## 2017-07-13 DIAGNOSIS — Z86010 Personal history of colonic polyps: Secondary | ICD-10-CM

## 2017-07-13 DIAGNOSIS — M797 Fibromyalgia: Secondary | ICD-10-CM | POA: Diagnosis not present

## 2017-07-13 DIAGNOSIS — E039 Hypothyroidism, unspecified: Secondary | ICD-10-CM

## 2017-07-13 DIAGNOSIS — Z0000 Encounter for general adult medical examination without abnormal findings: Secondary | ICD-10-CM

## 2017-07-13 DIAGNOSIS — E7800 Pure hypercholesterolemia, unspecified: Secondary | ICD-10-CM | POA: Diagnosis not present

## 2017-07-13 LAB — CBC WITH DIFFERENTIAL/PLATELET
Basophils Absolute: 0.1 10*3/uL (ref 0.0–0.1)
Basophils Relative: 1.3 % (ref 0.0–3.0)
Eosinophils Absolute: 0.5 10*3/uL (ref 0.0–0.7)
Eosinophils Relative: 6.3 % — ABNORMAL HIGH (ref 0.0–5.0)
HCT: 39.5 % (ref 36.0–46.0)
Hemoglobin: 13.3 g/dL (ref 12.0–15.0)
Lymphocytes Relative: 18.3 % (ref 12.0–46.0)
Lymphs Abs: 1.3 10*3/uL (ref 0.7–4.0)
MCHC: 33.7 g/dL (ref 30.0–36.0)
MCV: 90.7 fl (ref 78.0–100.0)
Monocytes Absolute: 0.6 10*3/uL (ref 0.1–1.0)
Monocytes Relative: 8.1 % (ref 3.0–12.0)
Neutro Abs: 4.8 10*3/uL (ref 1.4–7.7)
Neutrophils Relative %: 66 % (ref 43.0–77.0)
Platelets: 279 10*3/uL (ref 150.0–400.0)
RBC: 4.36 Mil/uL (ref 3.87–5.11)
RDW: 13.7 % (ref 11.5–15.5)
WBC: 7.2 10*3/uL (ref 4.0–10.5)

## 2017-07-13 LAB — COMPREHENSIVE METABOLIC PANEL
ALT: 17 U/L (ref 0–35)
AST: 20 U/L (ref 0–37)
Albumin: 4.1 g/dL (ref 3.5–5.2)
Alkaline Phosphatase: 85 U/L (ref 39–117)
BUN: 17 mg/dL (ref 6–23)
CO2: 30 mEq/L (ref 19–32)
Calcium: 10.9 mg/dL — ABNORMAL HIGH (ref 8.4–10.5)
Chloride: 100 mEq/L (ref 96–112)
Creatinine, Ser: 0.96 mg/dL (ref 0.40–1.20)
GFR: 60.69 mL/min (ref >60.00)
Glucose, Bld: 95 mg/dL (ref 70–99)
Potassium: 4.1 mEq/L (ref 3.5–5.1)
Sodium: 140 mEq/L (ref 135–145)
Total Bilirubin: 0.6 mg/dL (ref 0.2–1.2)
Total Protein: 7.6 g/dL (ref 6.0–8.3)

## 2017-07-13 LAB — LIPID PANEL
Cholesterol: 220 mg/dL — ABNORMAL HIGH (ref 0–200)
HDL: 54.5 mg/dL (ref >39.00)
LDL Cholesterol: 132 mg/dL — ABNORMAL HIGH (ref 0–99)
NonHDL: 165.89
Total CHOL/HDL Ratio: 4
Triglycerides: 169 mg/dL — ABNORMAL HIGH (ref 0.0–149.0)
VLDL: 33.8 mg/dL (ref 0.0–40.0)

## 2017-07-13 LAB — TSH: TSH: 3.33 u[IU]/mL (ref 0.35–4.50)

## 2017-07-13 NOTE — Addendum Note (Signed)
Addended by: Elmer Picker on: 07/13/2017 10:03 AM   Modules accepted: Orders

## 2017-07-13 NOTE — Patient Instructions (Signed)
Limit your sodium (Salt) intake    It is important that you exercise regularly, at least 20 minutes 3 to 4 times per week.  If you develop chest pain or shortness of breath seek  medical attention.  Take a calcium supplement, plus [phone] units of vitamin D

## 2017-07-13 NOTE — Progress Notes (Signed)
Subjective:    Patient ID: Grace Schmitt, female    DOB: 1946-11-14, 71 y.o.   MRN: [id number]  HPI  71 year old patient who is seen today for a preventive health examination and subsequent Medicare wellness visit. She was seen earlier the week with dysuria.  UA was normal.  The symptoms have resolved. She has a history of restless leg syndrome and is scheduled for [facility] neurology follow-up next month. She has had a recent mammogram last month and a follow-up colonoscopy last year  Past Medical History:  Diagnosis Date  . Allergy    prn claritin  . Fibromyalgia   . Hx of colonic polyps serrated and adenomatous 05/28/2005  . Hyperlipidemia   . Ischemic chest pain   . PONV (postoperative nausea and vomiting)   . Restless leg syndrome   . Thyroid disease      Social History   Social History  . Marital status: Married    Spouse name: N/A  . Number of children: N/A  . Years of education: N/A   Occupational History  . Not on file.   Social History Main Topics  . Smoking status: Never Smoker  . Smokeless tobacco: Never Used  . Alcohol use No  . Drug use: No  . Sexual activity: Not Currently   Other Topics Concern  . Not on file   Social History Narrative  . No narrative on file    Past Surgical History:  Procedure Laterality Date  . COLONOSCOPY  October 2011  . TONSILLECTOMY  1951  . TUBAL LIGATION  1978   x2    Family History  Problem Relation Age of Onset  . Dementia Mother   . Heart disease Mother        smal vessel disease  . Hypertension Brother   . Colon cancer Neg Hx   . Colon polyps Neg Hx   . Breast cancer Neg Hx     Allergies  Allergen Reactions  . Phenergan [Promethazine Hcl] Other (See Comments)    Pt has restless leg syndrome and the phenergan cause the RLS  to worsen     Current Outpatient Prescriptions on File Prior to Visit  Medication Sig Dispense Refill  . aspirin 81 MG tablet Take 81 mg by mouth daily.      Marland Kitchen  aspirin-acetaminophen-caffeine (EXCEDRIN MIGRAINE) 250-250-65 MG tablet Take 2 tablets by mouth daily as needed (morning pain).    . Azelaic Acid 15 % cream After skin is thoroughly washed and patted dry, gently but thoroughly massage a thin film of azelaic acid cream into the affected area twice daily, in the morning and evening. 50 g 4  . Calcium Carbonate-Vitamin D (CALCIUM 600+D) 600-400 MG-UNIT per tablet Take 2 tablets by mouth daily.     Marland Kitchen co-enzyme Q-10 (CO Q 10) 30 MG capsule Take 60 mg by mouth daily.      . DULoxetine (CYMBALTA) 30 MG capsule Take by mouth.    . Glucosamine-Chondroit-Vit C-Mn (GLUCOSAMINE-CHONDROITIN) CAPS Take 1 capsule by mouth daily.      Marland Kitchen levothyroxine (SYNTHROID, LEVOTHROID) 50 MCG tablet TAKE 1 TABLET (50 MCG TOTAL) BY MOUTH DAILY. 90 tablet 2  . loratadine (CLARITIN) 10 MG tablet Take 10 mg by mouth daily.      . methadone (DOLOPHINE) 5 MG tablet Take 7.5 mg by mouth daily.     . Multiple Vitamin (MULTIVITAMIN) tablet Take 1 tablet by mouth daily.      Marland Kitchen amoxicillin-clavulanate (AUGMENTIN) 875-125 MG tablet  Take 1 tablet by mouth 2 (two) times daily. (Patient not taking: Reported on 06/29/2017) 20 tablet 0  . HYDROcodone-acetaminophen (NORCO/VICODIN) 5-325 MG tablet Take 1-2 tablets by mouth every 6 (six) hours as needed for moderate pain. (Patient not taking: Reported on 07/13/2017) 20 tablet 0  . ondansetron (ZOFRAN ODT) 4 MG disintegrating tablet Take 1 tablet (4 mg total) by mouth every 8 (eight) hours as needed for nausea or vomiting. (Patient not taking: Reported on 07/13/2017) 24 tablet 1   No current facility-administered medications on file prior to visit.     BP 140/78 (BP Location: Left Arm, Patient Position: Sitting, Cuff Size: Normal)   Pulse 78   Temp 98.1 F (36.7 C) (Oral)   Resp 12   Ht 5\' 2"  (1.575 m)   Wt 176 lb (79.8 kg)   SpO2 99%   BMI 32.19 kg/m   Subsequent Medicare wellness visit  1. Risk factors, based on past  M,S,F history   cardiovascular risk factors none  2.  Physical activities: Remains active with regular exercise.  3.  Depression/mood: No history of depression or mood disorder she does have a history of fibromyalgia  4.  Hearing: No deficits  5.  ADL's: Independent in all aspects of daily living  6.  Fall risk: Low  7.  Home safety: No problems identified 8.  Height weight, and visual acuity; height and weight stable. Has had some modest weight gain with Neurontin. She states that there has been a change in her height with measurements and her GYN as well as this office. History of osteopenia on a bone density in 2006 9.  Counseling: Followup bone density recommended 10. Lab orders based on risk factors: Laboratory studies discussed and reviewed  11. Referral : Follow GYN neurology.  12. Care plan: Continue regular exercise calcium and vitamin D supplements 13. Cognitive assessment: Alert and oriented with normal affect no cognitive dysfunction 14.  Preventive services will include annual health examinations as well as gynecologic checks.  Due to her history of osteopenia.  She'll be consider for serial bone density studies.  She maintained on calcium and vitamin D supplementations.  She has a history of colonic polyps.  Will be continued on colonoscopies every 5 years.  Colonoscopy Done 2017 .  Annual eye examinations recommended.  She'll be continued to be followed by neurology.  Twice annually 15.  Provider list includes primary care GYN neurology and ophthalmology   Review of Systems  Constitutional: Negative.   HENT: Negative for congestion, dental problem, hearing loss, rhinorrhea, sinus pressure, sore throat and tinnitus.   Eyes: Negative for pain, discharge and visual disturbance.  Respiratory: Negative for cough and shortness of breath.   Cardiovascular: Negative for chest pain, palpitations and leg swelling.  Gastrointestinal: Negative for abdominal distention, abdominal pain, blood  in stool, constipation, diarrhea, nausea and vomiting.  Genitourinary: Negative for difficulty urinating, dysuria, flank pain, frequency, hematuria, pelvic pain, urgency, vaginal bleeding, vaginal discharge and vaginal pain.  Musculoskeletal: Positive for back pain and myalgias. Negative for arthralgias, gait problem and joint swelling.  Skin: Negative for rash.  Neurological: Negative for dizziness, syncope, speech difficulty, weakness, numbness and headaches.  Hematological: Negative for adenopathy.  Psychiatric/Behavioral: Positive for sleep disturbance. Negative for agitation, behavioral problems and dysphoric mood. The patient is not nervous/anxious.        Objective:   Physical Exam  Constitutional: She is oriented to person, place, and time. She appears well-developed and well-nourished.  HENT:  Head:  Normocephalic.  Right Ear: External ear normal.  Left Ear: External ear normal.  Mouth/Throat: Oropharynx is clear and moist.  Eyes: Pupils are equal, round, and reactive to light. Conjunctivae and EOM are normal.  Neck: Normal range of motion. Neck supple. No thyromegaly present.  Cardiovascular: Normal rate, regular rhythm, normal heart sounds and intact distal pulses.   Right posterior tibial pulses full.  Other pedal pulses faint  Pulmonary/Chest: Effort normal and breath sounds normal.  Abdominal: Soft. Bowel sounds are normal. She exhibits no mass. There is no tenderness.  Musculoskeletal: Normal range of motion.  Lymphadenopathy:    She has no cervical adenopathy.  Neurological: She is alert and oriented to person, place, and time.  Reflexes brisk and equal  Skin: Skin is warm and dry. No rash noted.  Psychiatric: She has a normal mood and affect. Her behavior is normal.          Assessment & Plan:   Preventive health examination Subsequent Medicare wellness visit Restless leg syndrome.  Follow-up [facility] neurology Fibromyalgia History of osteopenia.  Continue  calcium and vitamin D supplements and active lifestyle Hypothyroidism.   We'll check a TSH history colonic polypsContinue 5 year interval colonoscopies Dyslipidemia.  Review a lipid profile  Nyoka Cowden

## 2017-07-14 LAB — HEPATITIS C ANTIBODY: HCV Ab: NEGATIVE

## 2017-08-12 ENCOUNTER — Emergency Department (HOSPITAL_BASED_OUTPATIENT_CLINIC_OR_DEPARTMENT_OTHER)
Admission: EM | Admit: 2017-08-12 | Discharge: 2017-08-12 | Disposition: A | Discharge status: Home / Self Care | Payer: Medicare Other | Source: Home / Self Care | Attending: Emergency Medicine | Admitting: Emergency Medicine

## 2017-08-12 ENCOUNTER — Encounter (HOSPITAL_BASED_OUTPATIENT_CLINIC_OR_DEPARTMENT_OTHER): Payer: Self-pay | Admitting: Emergency Medicine

## 2017-08-12 DIAGNOSIS — E079 Disorder of thyroid, unspecified: Secondary | ICD-10-CM | POA: Insufficient documentation

## 2017-08-12 DIAGNOSIS — G9341 Metabolic encephalopathy: Secondary | ICD-10-CM | POA: Diagnosis not present

## 2017-08-12 DIAGNOSIS — Z79899 Other long term (current) drug therapy: Secondary | ICD-10-CM | POA: Insufficient documentation

## 2017-08-12 DIAGNOSIS — Z86010 Personal history of colonic polyps: Secondary | ICD-10-CM | POA: Diagnosis not present

## 2017-08-12 DIAGNOSIS — R51 Headache: Secondary | ICD-10-CM | POA: Diagnosis not present

## 2017-08-12 DIAGNOSIS — F329 Major depressive disorder, single episode, unspecified: Secondary | ICD-10-CM | POA: Diagnosis not present

## 2017-08-12 DIAGNOSIS — I6789 Other cerebrovascular disease: Secondary | ICD-10-CM | POA: Diagnosis not present

## 2017-08-12 DIAGNOSIS — M8580 Other specified disorders of bone density and structure, unspecified site: Secondary | ICD-10-CM | POA: Diagnosis not present

## 2017-08-12 DIAGNOSIS — Z888 Allergy status to other drugs, medicaments and biological substances status: Secondary | ICD-10-CM | POA: Diagnosis not present

## 2017-08-12 DIAGNOSIS — G2581 Restless legs syndrome: Secondary | ICD-10-CM | POA: Insufficient documentation

## 2017-08-12 DIAGNOSIS — M797 Fibromyalgia: Secondary | ICD-10-CM | POA: Diagnosis not present

## 2017-08-12 DIAGNOSIS — Z820 Family history of epilepsy and other diseases of the nervous system: Secondary | ICD-10-CM | POA: Diagnosis not present

## 2017-08-12 DIAGNOSIS — Z7982 Long term (current) use of aspirin: Secondary | ICD-10-CM

## 2017-08-12 DIAGNOSIS — E7439 Other disorders of intestinal carbohydrate absorption: Secondary | ICD-10-CM | POA: Diagnosis not present

## 2017-08-12 DIAGNOSIS — E039 Hypothyroidism, unspecified: Secondary | ICD-10-CM | POA: Diagnosis not present

## 2017-08-12 DIAGNOSIS — D649 Anemia, unspecified: Secondary | ICD-10-CM | POA: Diagnosis not present

## 2017-08-12 DIAGNOSIS — E876 Hypokalemia: Secondary | ICD-10-CM | POA: Diagnosis not present

## 2017-08-12 DIAGNOSIS — Z8249 Family history of ischemic heart disease and other diseases of the circulatory system: Secondary | ICD-10-CM | POA: Diagnosis not present

## 2017-08-12 DIAGNOSIS — G9340 Encephalopathy, unspecified: Secondary | ICD-10-CM | POA: Diagnosis not present

## 2017-08-12 DIAGNOSIS — R4701 Aphasia: Secondary | ICD-10-CM | POA: Diagnosis not present

## 2017-08-12 DIAGNOSIS — E785 Hyperlipidemia, unspecified: Secondary | ICD-10-CM | POA: Diagnosis not present

## 2017-08-12 NOTE — ED Provider Notes (Signed)
[facility] Provider Note   CSN: [id number] Arrival date & time: 08/12/17  3220     History   Chief Complaint Chief Complaint  Patient presents with  . Leg Problem    HPI Grace Schmitt is a 71 y.o. female.  Patient is a 71yo F with PMH restless leg syndrome and fibromyalgia who presents to ED with leg and arm restlessness. Patient states that her restless legs are a chronic problem for her, sees neurology with appointment scheduled for next week. States her legs were bothering her more than usual last night, tried hot bath which usually helps her as well as her home methadone and atrivan without much relief. She was able to go to sleep but this morning her legs were worse with thrashing and her arms were also restless, neither of which is new for her and returned back to baseline en route to ED this morning. No recent illnesses but did feel a little cold this morning but no chills currently.       Past Medical History:  Diagnosis Date  . Allergy    prn claritin  . Fibromyalgia   . Hx of colonic polyps serrated and adenomatous 05/28/2005  . Hyperlipidemia   . Ischemic chest pain   . PONV (postoperative nausea and vomiting)   . Restless leg syndrome   . Thyroid disease     Patient Active Problem List   Diagnosis Date Noted  . Osteopenia 06/05/2016  . Restless leg syndrome 03/31/2016  . Fibromyalgia 03/31/2016  . Hyperlipidemia 01/17/2016  . Exertional chest pain   . Chest pain at rest 01/15/2016  . Depression 01/15/2016  . Hypothyroidism 06/04/2014  . Hx of colonic polyps serrated and adenomatous 05/28/2005    Past Surgical History:  Procedure Laterality Date  . COLONOSCOPY  October 2011  . TONSILLECTOMY  1951  . TUBAL LIGATION  1978   x2    OB History    No data available       Home Medications    Prior to Admission medications   Medication Sig Start Date End Date Taking? Authorizing Provider  aspirin 81 MG tablet Take 81 mg by mouth  daily.      [provider]  aspirin-acetaminophen-caffeine (EXCEDRIN MIGRAINE) [phone] MG tablet Take 2 tablets by mouth daily as needed (morning pain).    [provider]  Azelaic Acid 15 % cream After skin is thoroughly washed and patted dry, gently but thoroughly massage a thin film of azelaic acid cream into the affected area twice daily, in the morning and evening. 04/30/15   Marletta Lor, MD  Calcium Carbonate-Vitamin D (CALCIUM 600+D) 600-400 MG-UNIT per tablet Take 2 tablets by mouth daily.     [provider]  co-enzyme Q-10 (CO Q 10) 30 MG capsule Take 60 mg by mouth daily.      [provider]  DULoxetine (CYMBALTA) 30 MG capsule Take by mouth. 10/24/16 10/24/17  [provider]  Glucosamine-Chondroit-Vit C-Mn (GLUCOSAMINE-CHONDROITIN) CAPS Take 1 capsule by mouth daily.      [provider]  HYDROcodone-acetaminophen (NORCO/VICODIN) 5-325 MG tablet Take 1-2 tablets by mouth every 6 (six) hours as needed for moderate pain. Patient not taking: Reported on 07/13/2017 10/20/16   Fredia Sorrow, MD  levothyroxine (SYNTHROID, LEVOTHROID) 50 MCG tablet TAKE 1 TABLET (50 MCG TOTAL) BY MOUTH DAILY. 02/01/17   Marletta Lor, MD  loratadine (CLARITIN) 10 MG tablet Take 10 mg by mouth daily.  [provider]  methadone (DOLOPHINE) 5 MG tablet Take 7.5 mg by mouth daily.     [provider]  Multiple Vitamin (MULTIVITAMIN) tablet Take 1 tablet by mouth daily.      [provider]  ondansetron (ZOFRAN ODT) 4 MG disintegrating tablet Take 1 tablet (4 mg total) by mouth every 8 (eight) hours as needed for nausea or vomiting. Patient not taking: Reported on 07/13/2017 06/29/17   Marletta Lor, MD    Family History Family History  Problem Relation Age of Onset  . Dementia Mother   . Heart disease Mother        smal vessel disease  . Hypertension Brother   . Colon cancer Neg Hx   . Colon polyps  Neg Hx   . Breast cancer Neg Hx     Social History Social History  Substance Use Topics  . Smoking status: Never Smoker  . Smokeless tobacco: Never Used  . Alcohol use No     Allergies   Phenergan [promethazine hcl]   Review of Systems Review of Systems  Constitutional: Negative for chills and fever.  Respiratory: Negative for shortness of breath.   Cardiovascular: Negative for chest pain.  Gastrointestinal: Negative for abdominal pain, nausea and vomiting.  Musculoskeletal:       Leg and arm pain and restlessness  Skin: Negative for rash.  Neurological: Negative for dizziness, weakness and headaches.     Physical Exam Updated Vital Signs BP (!) 161/84 (BP Location: Right Arm)   Pulse 87   Temp 97.6 F (36.4 C) (Oral)   Resp 16   Ht 5\' 3"  (1.6 m)   Wt 77.1 kg (170 lb)   SpO2 95%   BMI 30.11 kg/m   Physical Exam  Constitutional: She is oriented to person, place, and time. She appears well-developed and well-nourished. No distress.  HENT:  Head: Normocephalic and atraumatic.  Nose: Nose normal.  Eyes: Pupils are equal, round, and reactive to light. Conjunctivae and EOM are normal.  Neck: Normal range of motion. Neck supple.  Cardiovascular: Normal rate, regular rhythm, normal heart sounds and intact distal pulses.   No murmur heard. Pulmonary/Chest: Effort normal and breath sounds normal. No respiratory distress.  Abdominal: Soft. Bowel sounds are normal. She exhibits no distension. There is no tenderness. There is no rebound and no guarding.  Musculoskeletal: Normal range of motion. She exhibits no edema, tenderness or deformity.  Neurological: She is alert and oriented to person, place, and time. She displays normal reflexes. No sensory deficit. She exhibits normal muscle tone. Coordination normal.  Gait normal   Skin: Skin is warm and dry. Capillary refill takes less than 2 seconds. No rash noted.  Psychiatric: She has a normal mood and affect.     ED  Treatments / Results  Labs (all labs ordered are listed, but only abnormal results are displayed) Labs Reviewed - No data to display  EKG  EKG Interpretation None       Radiology No results found.  Procedures Procedures (including critical care time)  Medications Ordered in ED Medications - No data to display   Initial Impression / Assessment and Plan / ED Course  I have reviewed the triage vital signs and the nursing notes.  Pertinent labs & imaging results that were available during my care of the patient were reviewed by me and considered in my medical decision making (see chart for details).   Patient is a 71yo F with know restless leg syndrome  and fibromyalgia who presented with leg and arm restlessness. Patient is overall well appearing, no jerky movements and neuro exam is unremarkable. No concern for seizure-like activity. Appears to be back at her baseline. Recommended follow up with neurology as outpatient as planned with appointment next week.  Final Clinical Impressions(s) / ED Diagnoses   Final diagnoses:  Restless leg syndrome    New Prescriptions Discharge Medication List as of 08/12/2017 11:08 AM       Bufford Lope, DO 08/12/17 1348

## 2017-08-12 NOTE — Discharge Instructions (Signed)
You were seen in the ER for restless leg syndrome that appears to have returned back to your baseline. With the worsening you are having, you should follow up with your neurologist as planned with your appointment next week.

## 2017-08-12 NOTE — ED Provider Notes (Signed)
I have personally seen and examined the patient. I have reviewed the documentation on PMH/FH/Soc Hx. I have discussed the plan of care with the resident and patient.  I have reviewed and agree with the resident's documentation. Please see associated encounter note.   EKG Interpretation None         Grace Schmitt, Grayce Sessions, MD 08/12/17 1133

## 2017-08-12 NOTE — ED Triage Notes (Addendum)
Patient reports history of restless leg syndrome.  States that she takes methadone for this but since yesterday has been unable to get relief from this.  States she tried to get in hot bath 3 times yesterday without relief. Denies injuries or recent medication changes.  Patient ambulatory to room without difficulty.  In NAD.

## 2017-08-13 ENCOUNTER — Emergency Department (HOSPITAL_BASED_OUTPATIENT_CLINIC_OR_DEPARTMENT_OTHER): Payer: Medicare Other

## 2017-08-13 ENCOUNTER — Encounter (HOSPITAL_BASED_OUTPATIENT_CLINIC_OR_DEPARTMENT_OTHER): Payer: Self-pay

## 2017-08-13 ENCOUNTER — Inpatient Hospital Stay (HOSPITAL_BASED_OUTPATIENT_CLINIC_OR_DEPARTMENT_OTHER)
Admission: EM | Admit: 2017-08-13 | Discharge: 2017-08-16 | DRG: 072 | Disposition: A | Discharge status: Home Health | Payer: Medicare Other | Attending: Internal Medicine | Admitting: Internal Medicine

## 2017-08-13 DIAGNOSIS — E039 Hypothyroidism, unspecified: Secondary | ICD-10-CM | POA: Diagnosis present

## 2017-08-13 DIAGNOSIS — F1190 Opioid use, unspecified, uncomplicated: Secondary | ICD-10-CM | POA: Diagnosis present

## 2017-08-13 DIAGNOSIS — G9340 Encephalopathy, unspecified: Secondary | ICD-10-CM | POA: Diagnosis present

## 2017-08-13 DIAGNOSIS — G9341 Metabolic encephalopathy: Principal | ICD-10-CM | POA: Diagnosis present

## 2017-08-13 DIAGNOSIS — G2581 Restless legs syndrome: Secondary | ICD-10-CM | POA: Diagnosis present

## 2017-08-13 DIAGNOSIS — Z8249 Family history of ischemic heart disease and other diseases of the circulatory system: Secondary | ICD-10-CM

## 2017-08-13 DIAGNOSIS — Z820 Family history of epilepsy and other diseases of the nervous system: Secondary | ICD-10-CM

## 2017-08-13 DIAGNOSIS — Z888 Allergy status to other drugs, medicaments and biological substances status: Secondary | ICD-10-CM

## 2017-08-13 DIAGNOSIS — Z86010 Personal history of colonic polyps: Secondary | ICD-10-CM

## 2017-08-13 DIAGNOSIS — F329 Major depressive disorder, single episode, unspecified: Secondary | ICD-10-CM | POA: Diagnosis present

## 2017-08-13 DIAGNOSIS — F32A Depression, unspecified: Secondary | ICD-10-CM | POA: Diagnosis present

## 2017-08-13 DIAGNOSIS — E876 Hypokalemia: Secondary | ICD-10-CM | POA: Insufficient documentation

## 2017-08-13 DIAGNOSIS — M797 Fibromyalgia: Secondary | ICD-10-CM | POA: Diagnosis present

## 2017-08-13 DIAGNOSIS — E7439 Other disorders of intestinal carbohydrate absorption: Secondary | ICD-10-CM | POA: Diagnosis present

## 2017-08-13 DIAGNOSIS — D649 Anemia, unspecified: Secondary | ICD-10-CM | POA: Diagnosis present

## 2017-08-13 DIAGNOSIS — E785 Hyperlipidemia, unspecified: Secondary | ICD-10-CM | POA: Diagnosis present

## 2017-08-13 DIAGNOSIS — M8580 Other specified disorders of bone density and structure, unspecified site: Secondary | ICD-10-CM | POA: Diagnosis present

## 2017-08-13 LAB — COMPREHENSIVE METABOLIC PANEL
ALT: 21 U/L (ref 14–54)
AST: 27 U/L (ref 15–41)
Albumin: 4.2 g/dL (ref 3.5–5.0)
Alkaline Phosphatase: 64 U/L (ref 38–126)
Anion gap: 10 (ref 5–15)
BUN: 20 mg/dL (ref 6–20)
CO2: 26 mmol/L (ref 22–32)
Calcium: 11.5 mg/dL — ABNORMAL HIGH (ref 8.9–10.3)
Chloride: 101 mmol/L (ref 101–111)
Creatinine, Ser: 0.98 mg/dL (ref 0.44–1.00)
GFR calc Af Amer: >60 mL/min (ref >60)
GFR calc non Af Amer: 56 mL/min — ABNORMAL LOW (ref >60)
Glucose, Bld: 105 mg/dL — ABNORMAL HIGH (ref 65–99)
Potassium: 3.3 mmol/L — ABNORMAL LOW (ref 3.5–5.1)
Sodium: 137 mmol/L (ref 135–145)
Total Bilirubin: 0.6 mg/dL (ref 0.3–1.2)
Total Protein: 7.7 g/dL (ref 6.5–8.1)

## 2017-08-13 LAB — ETHANOL: Alcohol, Ethyl (B): <5 mg/dL (ref <5)

## 2017-08-13 LAB — CBC
HCT: 37.4 % (ref 36.0–46.0)
Hemoglobin: 12.4 g/dL (ref 12.0–15.0)
MCH: 30 pg (ref 26.0–34.0)
MCHC: 33.2 g/dL (ref 30.0–36.0)
MCV: 90.6 fL (ref 78.0–100.0)
Platelets: 264 10*3/uL (ref 150–400)
RBC: 4.13 MIL/uL (ref 3.87–5.11)
RDW: 13.6 % (ref 11.5–15.5)
WBC: 8.5 10*3/uL (ref 4.0–10.5)

## 2017-08-13 LAB — RAPID URINE DRUG SCREEN, HOSP PERFORMED
Amphetamines: NOT DETECTED
Barbiturates: NOT DETECTED
Benzodiazepines: NOT DETECTED
Cocaine: NOT DETECTED
Opiates: NOT DETECTED
Tetrahydrocannabinol: NOT DETECTED

## 2017-08-13 LAB — URINALYSIS, ROUTINE W REFLEX MICROSCOPIC
Bilirubin Urine: NEGATIVE
Glucose, UA: NEGATIVE mg/dL
Hgb urine dipstick: NEGATIVE
Ketones, ur: NEGATIVE mg/dL
Nitrite: NEGATIVE
Protein, ur: NEGATIVE mg/dL
Specific Gravity, Urine: 1.01 (ref 1.005–1.030)
pH: 7 (ref 5.0–8.0)

## 2017-08-13 LAB — URINALYSIS, MICROSCOPIC (REFLEX): RBC / HPF: NONE SEEN RBC/hpf (ref 0–5)

## 2017-08-13 LAB — DIFFERENTIAL
Basophils Absolute: 0.1 10*3/uL (ref 0.0–0.1)
Basophils Relative: 1 %
Eosinophils Absolute: 0.4 10*3/uL (ref 0.0–0.7)
Eosinophils Relative: 5 %
Lymphocytes Relative: 18 %
Lymphs Abs: 1.6 10*3/uL (ref 0.7–4.0)
Monocytes Absolute: 0.9 10*3/uL (ref 0.1–1.0)
Monocytes Relative: 11 %
Neutro Abs: 5.6 10*3/uL (ref 1.7–7.7)
Neutrophils Relative %: 65 %

## 2017-08-13 LAB — PROTIME-INR
INR: 1.05
Prothrombin Time: 13.7 seconds (ref 11.4–15.2)

## 2017-08-13 LAB — TROPONIN I: Troponin I: <0.03 ng/mL (ref <0.03)

## 2017-08-13 LAB — APTT: aPTT: 42 seconds — ABNORMAL HIGH (ref 24–36)

## 2017-08-13 MED ORDER — ACETAMINOPHEN 650 MG RE SUPP: 650.0000 mg | RECTAL | Status: DC | PRN

## 2017-08-13 MED ORDER — ENOXAPARIN SODIUM 40 MG/0.4ML ~~LOC~~ SOLN
40.0000 mg | Freq: Every day | SUBCUTANEOUS | Status: DC
  Administered 2017-08-14 – 2017-08-16 (×3): 40 mg via SUBCUTANEOUS
  Filled 2017-08-13 (×3): qty 0.4

## 2017-08-13 MED ORDER — POTASSIUM CHLORIDE CRYS ER 20 MEQ PO TBCR
40.0000 meq | EXTENDED_RELEASE_TABLET | Freq: Once | ORAL | Status: AC
  Administered 2017-08-13: 40 meq via ORAL
  Filled 2017-08-13: qty 2

## 2017-08-13 MED ORDER — ACETAMINOPHEN 160 MG/5ML PO SOLN: 650.0000 mg | ORAL | Status: DC | PRN

## 2017-08-13 MED ORDER — ACETAMINOPHEN 325 MG PO TABS
650.0000 mg | ORAL_TABLET | Freq: Once | ORAL | Status: AC
  Administered 2017-08-13: 650 mg via ORAL
  Filled 2017-08-13: qty 2

## 2017-08-13 MED ORDER — ACETAMINOPHEN 325 MG PO TABS
650.0000 mg | ORAL_TABLET | ORAL | Status: DC | PRN
  Administered 2017-08-14 (×2): 650 mg via ORAL
  Filled 2017-08-13 (×2): qty 2

## 2017-08-13 MED ORDER — ASPIRIN 325 MG PO TABS
325.0000 mg | ORAL_TABLET | Freq: Once | ORAL | Status: AC
  Administered 2017-08-13: 325 mg via ORAL
  Filled 2017-08-13: qty 1

## 2017-08-13 MED ORDER — SENNOSIDES-DOCUSATE SODIUM 8.6-50 MG PO TABS: 1.0000 | ORAL_TABLET | Freq: Every evening | ORAL | Status: DC | PRN

## 2017-08-13 NOTE — ED Triage Notes (Addendum)
Pt's husband states that she was disoriented yesterday-she was seen here for bliat leg pain yesterday but did not mention pt's disorientation yesterday-husband became tearful when he stated they did not tell staff about pt's AMS due to her having so much pain lin legs yesterday-pt states she is here now because "i just couldn't function" "i couldn't make any sense"-pt is alert-answering ?s appropriately-speech is clear-presents to triage in w/c however states she did walk into ED

## 2017-08-13 NOTE — ED Notes (Signed)
Attempted IV x 5 by 3 RNs with no success.

## 2017-08-13 NOTE — ED Provider Notes (Signed)
[place] DEPT [facility] Provider Note   CSN: [id number] Arrival date & time: 08/13/17  1811   Level V caveat altered mental status  History   Chief Complaint Chief Complaint  Patient presents with  . Altered Mental Status    HPI Grace Schmitt is a 71 y.o. female.Patient complains of difficulty expressing her thoughts since yesterday afternoon. Her husband reports that yesterday she could not remember how to put on her glasses and today she could not figure out how to plug in her cell phone although all components of the phone and chargewere present.. Patient was in here for bilateral leg pain yesterday. Leg are much improved. No other associated symptoms. Patient also reports that she has trouble expressing her words.HPI No focal numbness or weakness no visual changes no other associated symptoms Past Medical History:  Diagnosis Date  . Allergy    prn claritin  . Fibromyalgia   . Hx of colonic polyps serrated and adenomatous 05/28/2005  . Hyperlipidemia   . Ischemic chest pain   . PONV (postoperative nausea and vomiting)   . Restless leg syndrome   . Thyroid disease     Patient Active Problem List   Diagnosis Date Noted  . Osteopenia 06/05/2016  . Restless leg syndrome 03/31/2016  . Fibromyalgia 03/31/2016  . Hyperlipidemia 01/17/2016  . Exertional chest pain   . Chest pain at rest 01/15/2016  . Depression 01/15/2016  . Hypothyroidism 06/04/2014  . Hx of colonic polyps serrated and adenomatous 05/28/2005    Past Surgical History:  Procedure Laterality Date  . COLONOSCOPY  October 2011  . TONSILLECTOMY  1951  . TUBAL LIGATION  1978   x2    OB History    No data available       Home Medications    Prior to Admission medications   Medication Sig Start Date End Date Taking? Authorizing Provider  aspirin 81 MG tablet Take 81 mg by mouth daily.      [provider]  aspirin-acetaminophen-caffeine (EXCEDRIN MIGRAINE) [phone] MG tablet Take 2  tablets by mouth daily as needed (morning pain).    [provider]  Azelaic Acid 15 % cream After skin is thoroughly washed and patted dry, gently but thoroughly massage a thin film of azelaic acid cream into the affected area twice daily, in the morning and evening. 04/30/15   Marletta Lor, MD  Calcium Carbonate-Vitamin D (CALCIUM 600+D) 600-400 MG-UNIT per tablet Take 2 tablets by mouth daily.     [provider]  co-enzyme Q-10 (CO Q 10) 30 MG capsule Take 60 mg by mouth daily.      [provider]  DULoxetine (CYMBALTA) 30 MG capsule Take by mouth. 10/24/16 10/24/17  [provider]  Glucosamine-Chondroit-Vit C-Mn (GLUCOSAMINE-CHONDROITIN) CAPS Take 1 capsule by mouth daily.      [provider]  HYDROcodone-acetaminophen (NORCO/VICODIN) 5-325 MG tablet Take 1-2 tablets by mouth every 6 (six) hours as needed for moderate pain. Patient not taking: Reported on 07/13/2017 10/20/16   Fredia Sorrow, MD  levothyroxine (SYNTHROID, LEVOTHROID) 50 MCG tablet TAKE 1 TABLET (50 MCG TOTAL) BY MOUTH DAILY. 02/01/17   Marletta Lor, MD  loratadine (CLARITIN) 10 MG tablet Take 10 mg by mouth daily.      [provider]  methadone (DOLOPHINE) 5 MG tablet Take 7.5 mg by mouth daily.     [provider]  Multiple Vitamin (MULTIVITAMIN) tablet Take 1 tablet by mouth daily.      [provider]  ondansetron (ZOFRAN ODT) 4 MG disintegrating tablet Take 1 tablet (4 mg total) by mouth every 8 (eight) hours as needed for nausea or vomiting. Patient not taking: Reported on 07/13/2017 06/29/17   Marletta Lor, MD    Family History Family History  Problem Relation Age of Onset  . Dementia Mother   . Heart disease Mother        smal vessel disease  . Hypertension Brother   . Colon cancer Neg Hx   . Colon polyps Neg Hx   . Breast cancer Neg Hx     Social History Social History  Substance Use Topics  . Smoking status:  Never Smoker  . Smokeless tobacco: Never Used  . Alcohol use No     Allergies   Phenergan [promethazine hcl]   Review of Systems Review of Systems  Unable to perform ROS: Mental status change  Neurological: Positive for speech difficulty.     Physical Exam Updated Vital Signs BP (!) 158/89   Pulse 77   Temp 99.2 F (37.3 C) (Oral)   Resp 19   Ht 5\' 3"  (1.6 m)   Wt 77.1 kg (170 lb)   SpO2 93%   BMI 30.11 kg/m   Physical Exam  Constitutional: She is oriented to person, place, and time. She appears well-developed and well-nourished.  HENT:  Head: Normocephalic and atraumatic.  Eyes: Pupils are equal, round, and reactive to light. Conjunctivae are normal.  Neck: Neck supple. No tracheal deviation present. No thyromegaly present.  Cardiovascular: Normal rate and regular rhythm.   No murmur heard. Pulmonary/Chest: Effort normal and breath sounds normal.  Abdominal: Soft. Bowel sounds are normal. She exhibits no distension. There is no tenderness.  Musculoskeletal: Normal range of motion. She exhibits no edema or tenderness.  Neurological: She is alert and oriented to person, place, and time. Coordination normal.  DTRs symmetric bilaterally at knee jerk biceps toes downgoing bilaterally finger-nose normal  Skin: Skin is warm and dry. No rash noted.  Psychiatric: She has a normal mood and affect.  Nursing note and vitals reviewed.    ED Treatments / Results  Labs (all labs ordered are listed, but only abnormal results are displayed) Labs Reviewed  ETHANOL  PROTIME-INR  APTT  CBC  DIFFERENTIAL  COMPREHENSIVE METABOLIC PANEL  RAPID URINE DRUG SCREEN, HOSP PERFORMED  URINALYSIS, ROUTINE W REFLEX MICROSCOPIC    EKG  EKG Interpretation  Date/Time:  Friday August 13 2017 18:34:08 EDT Ventricular Rate:  77 PR Interval:    QRS Duration: 94 QT Interval:  393 QTC Calculation: 445 R Axis:   -13 Text Interpretation:  Sinus rhythm Consider left atrial enlargement  Anteroseptal infarct, old No significant change since last tracing Confirmed by Orlie Dakin [phone] on 08/13/2017 6:46:35 PM      Results for orders placed or performed during the hospital encounter of 08/13/17  Ethanol  Result Value Ref Range   Alcohol, Ethyl (B) <5 <5 mg/dL  Protime-INR  Result Value Ref Range   Prothrombin Time 13.7 11.4 - 15.2 seconds   INR 1.05   APTT  Result Value Ref Range   aPTT 42 (H) 24 - 36 seconds  CBC  Result Value Ref Range   WBC 8.5 4.0 - 10.5 K/uL   RBC 4.13 3.87 - 5.11 MIL/uL   Hemoglobin 12.4 12.0 - 15.0 g/dL   HCT 37.4 36.0 - 46.0 %   MCV 90.6 78.0 - 100.0 fL   MCH 30.0 26.0 - 34.0  pg   MCHC 33.2 30.0 - 36.0 g/dL   RDW 13.6 11.5 - 15.5 %   Platelets 264 150 - 400 K/uL  Differential  Result Value Ref Range   Neutrophils Relative % 65 %   Neutro Abs 5.6 1.7 - 7.7 K/uL   Lymphocytes Relative 18 %   Lymphs Abs 1.6 0.7 - 4.0 K/uL   Monocytes Relative 11 %   Monocytes Absolute 0.9 0.1 - 1.0 K/uL   Eosinophils Relative 5 %   Eosinophils Absolute 0.4 0.0 - 0.7 K/uL   Basophils Relative 1 %   Basophils Absolute 0.1 0.0 - 0.1 K/uL  Comprehensive metabolic panel  Result Value Ref Range   Sodium 137 135 - 145 mmol/L   Potassium 3.3 (L) 3.5 - 5.1 mmol/L   Chloride 101 101 - 111 mmol/L   CO2 26 22 - 32 mmol/L   Glucose, Bld 105 (H) 65 - 99 mg/dL   BUN 20 6 - 20 mg/dL   Creatinine, Ser 0.98 0.44 - 1.00 mg/dL   Calcium 11.5 (H) 8.9 - 10.3 mg/dL   Total Protein 7.7 6.5 - 8.1 g/dL   Albumin 4.2 3.5 - 5.0 g/dL   AST 27 15 - 41 U/L   ALT 21 14 - 54 U/L   Alkaline Phosphatase 64 38 - 126 U/L   Total Bilirubin 0.6 0.3 - 1.2 mg/dL   GFR calc non Af Amer 56 (L) >60 mL/min   GFR calc Af Amer >60 >60 mL/min   Anion gap 10 5 - 15  Urine rapid drug screen (hosp performed)not at [facility]  Result Value Ref Range   Opiates NONE DETECTED NONE DETECTED   Cocaine NONE DETECTED NONE DETECTED   Benzodiazepines NONE DETECTED NONE DETECTED   Amphetamines  NONE DETECTED NONE DETECTED   Tetrahydrocannabinol NONE DETECTED NONE DETECTED   Barbiturates NONE DETECTED NONE DETECTED  Urinalysis, Routine w reflex microscopic  Result Value Ref Range   Color, Urine YELLOW YELLOW   APPearance CLOUDY (A) CLEAR   Specific Gravity, Urine 1.010 1.005 - 1.030   pH 7.0 5.0 - 8.0   Glucose, UA NEGATIVE NEGATIVE mg/dL   Hgb urine dipstick NEGATIVE NEGATIVE   Bilirubin Urine NEGATIVE NEGATIVE   Ketones, ur NEGATIVE NEGATIVE mg/dL   Protein, ur NEGATIVE NEGATIVE mg/dL   Nitrite NEGATIVE NEGATIVE   Leukocytes, UA TRACE (A) NEGATIVE  Troponin I  Result Value Ref Range   Troponin I <0.03 <0.03 ng/mL  Urinalysis, Microscopic (reflex)  Result Value Ref Range   RBC / HPF NONE SEEN 0 - 5 RBC/hpf   WBC, UA 0-5 0 - 5 WBC/hpf   Bacteria, UA FEW (A) NONE SEEN   Squamous Epithelial / LPF 0-5 (A) NONE SEEN   Urine-Other AMORPHOUS URATES/PHOSPHATES    Ct Head Wo Contrast  Result Date: 08/13/2017 CLINICAL DATA:  Altered level of consciousness.  Headache. EXAM: CT HEAD WITHOUT CONTRAST TECHNIQUE: Contiguous axial images were obtained from the base of the skull through the vertex without intravenous contrast. COMPARISON:  Head CT 10/20/2016 FINDINGS: Brain: Stable degree of atrophy and chronic small vessel ischemia.No intracranial hemorrhage, mass effect, or midline shift. No hydrocephalus. The basilar cisterns are patent. No evidence of territorial infarct. No extra-axial or intracranial fluid collection. Vascular: Atherosclerosis of skullbase vasculature without hyperdense vessel or abnormal calcification. Skull: No fracture or focal lesion. Sinuses/Orbits: Improved left mastoid air cell opacification with mild residual in the lower portion. Right mastoid air cells are clear. Paranasal sinuses are well-aerated. Other:  None. IMPRESSION: 1. No acute intracranial abnormality. Stable atrophy and chronic small vessel ischemia. 2. Improved left mastoid air cell opacification.  Electronically Signed   By: Jeb Levering M.D.   On: 08/13/2017 20:06    Radiology No results found.  Procedures Procedures (including critical care time)  Medications Ordered in ED Medications - No data to display   Initial Impression / Assessment and Plan / ED Course  I have reviewed the triage vital signs and the nursing notes.  Pertinent labs & imaging results that were available during my care of the patient were reviewed by me and considered in my medical decision making (see chart for details).     9:30 PM patient resting comfortably. Alert Glasgow Coma Score 15. She complains of mild headache since being here which was treated with Tylenol she is comfortable. She was also treated with her own methadone for chronic pain. Speech is presently clear and she appears in no distress. I consulted [facility] neurologist who will see patient in consultation. He requests admission to hospitalist. I spoke with Dr.Enokpe, hospitalist physician who accepts patient in transfer Patient received oral potassium supplementation while here for hypokalemia Final Clinical Impressions(s) / ED Diagnoses  Diagnosis #1 acute encephalopathy Final diagnoses:  None   #2 hypokalemia New Prescriptions New Prescriptions   No medications on file     Orlie Dakin, MD 08/13/17 2138

## 2017-08-13 NOTE — ED Notes (Signed)
States that she was seen in the ED yesterday for restless leg syndrome. States that she walked a mile yesterday after leaving the ED. Reports that she told her husband that she was so cold from unloading the car in the middle of the night. Pt aware of the confusion.

## 2017-08-13 NOTE — ED Notes (Signed)
Attempted IV stick twice without success

## 2017-08-13 NOTE — ED Notes (Signed)
Pt in CT.

## 2017-08-14 ENCOUNTER — Observation Stay (HOSPITAL_COMMUNITY): Payer: Medicare Other

## 2017-08-14 ENCOUNTER — Encounter (HOSPITAL_COMMUNITY): Payer: Self-pay | Admitting: Family Medicine

## 2017-08-14 DIAGNOSIS — R4701 Aphasia: Secondary | ICD-10-CM | POA: Diagnosis not present

## 2017-08-14 DIAGNOSIS — I6789 Other cerebrovascular disease: Secondary | ICD-10-CM | POA: Diagnosis not present

## 2017-08-14 DIAGNOSIS — F329 Major depressive disorder, single episode, unspecified: Secondary | ICD-10-CM | POA: Diagnosis not present

## 2017-08-14 DIAGNOSIS — E039 Hypothyroidism, unspecified: Secondary | ICD-10-CM | POA: Diagnosis not present

## 2017-08-14 LAB — LIPID PANEL
Cholesterol: 192 mg/dL (ref 0–200)
HDL: 55 mg/dL (ref >40)
LDL Cholesterol: 117 mg/dL — ABNORMAL HIGH (ref 0–99)
Total CHOL/HDL Ratio: 3.5 RATIO
Triglycerides: 100 mg/dL (ref <150)
VLDL: 20 mg/dL (ref 0–40)

## 2017-08-14 LAB — HEPATIC FUNCTION PANEL
ALT: 19 U/L (ref 14–54)
AST: 24 U/L (ref 15–41)
Albumin: 3.7 g/dL (ref 3.5–5.0)
Alkaline Phosphatase: 59 U/L (ref 38–126)
Bilirubin, Direct: <0.1 mg/dL — ABNORMAL LOW (ref 0.1–0.5)
Total Bilirubin: 0.7 mg/dL (ref 0.3–1.2)
Total Protein: 7.4 g/dL (ref 6.5–8.1)

## 2017-08-14 LAB — MAGNESIUM: Magnesium: 2 mg/dL (ref 1.7–2.4)

## 2017-08-14 LAB — CALCIUM, IONIZED: Calcium, Ionized, Serum: 6.2 mg/dL — ABNORMAL HIGH (ref 4.5–5.6)

## 2017-08-14 LAB — TSH: TSH: 4.114 u[IU]/mL (ref 0.350–4.500)

## 2017-08-14 LAB — HEMOGLOBIN A1C
Hgb A1c MFr Bld: 5.7 % — ABNORMAL HIGH (ref 4.8–5.6)
Mean Plasma Glucose: 116.89 mg/dL

## 2017-08-14 MED ORDER — DULOXETINE HCL 30 MG PO CPEP
30.0000 mg | ORAL_CAPSULE | Freq: Two times a day (BID) | ORAL | Status: DC
  Administered 2017-08-14 – 2017-08-16 (×5): 30 mg via ORAL
  Filled 2017-08-14 (×5): qty 1

## 2017-08-14 MED ORDER — LEVOTHYROXINE SODIUM 50 MCG PO TABS
50.0000 ug | ORAL_TABLET | Freq: Every day | ORAL | Status: DC
  Administered 2017-08-14 – 2017-08-16 (×3): 50 ug via ORAL
  Filled 2017-08-14 (×4): qty 1

## 2017-08-14 MED ORDER — POTASSIUM CHLORIDE IN NACL 20-0.9 MEQ/L-% IV SOLN
INTRAVENOUS | Status: DC
  Administered 2017-08-14 – 2017-08-16 (×6): via INTRAVENOUS
  Administered 2017-08-16: 1000 mL via INTRAVENOUS
  Filled 2017-08-14 (×7): qty 1000

## 2017-08-14 MED ORDER — METHADONE HCL 5 MG PO TABS
7.5000 mg | ORAL_TABLET | Freq: Every day | ORAL | Status: DC
  Administered 2017-08-14 – 2017-08-15 (×2): 7.5 mg via ORAL
  Filled 2017-08-14 (×3): qty 2

## 2017-08-14 NOTE — Evaluation (Signed)
Physical Therapy Evaluation Patient Details Name: Grace Schmitt MRN: [id number] DOB: 07-Jul-1946 Today's Date: 08/14/2017   History of Present Illness  Pt is a 71 y/o female admitted secondary to confusion, acute metabolic encephalopathy. Awaiting neuro consult. PMH including but not limited to fibromyalgia and RLS.  Clinical Impression  Pt presented supine in bed with HOB elevated, awake and willing to participate in therapy session. Prior to admission, pt reported that she was very independent with all functional mobility and ADLs. Pt's spouse and other family members present at bedside. Pt ambulated in hallway with min guard for safety without use of an AD. Pt's gait was very cautious and slow with a consistent R sided drift. Pt also successfully completed stair training with min guard and use of rails. Pt would continue to benefit from skilled physical therapy services at this time while admitted and after d/c to address the below listed limitations in order to improve overall safety and independence with functional mobility.     Follow Up Recommendations Home health PT    Equipment Recommendations  None recommended by PT    Recommendations for Other Services       Precautions / Restrictions Precautions Precautions: None Restrictions Weight Bearing Restrictions: No      Mobility  Bed Mobility Overal bed mobility: Modified Independent                Transfers Overall transfer level: Modified independent Equipment used: None                Ambulation/Gait Ambulation/Gait assistance: Min guard Ambulation Distance (Feet): 300 Feet Assistive device: None Gait Pattern/deviations: Step-through pattern;Decreased step length - right;Decreased step length - left;Decreased stride length;Drifts right/left Gait velocity: decreased Gait velocity interpretation: Below normal speed for age/gender General Gait Details: consistent R lateral drifting, able to correct with  cueing. pt with very slow, cautious gait, mild instability but no overt LOB or need for physical assistance, min guard for safety  Stairs Stairs: Yes Stairs assistance: Min guard Stair Management: One rail Right;Step to pattern;Forwards Number of Stairs: 2 (2 steps x1, 3 small steps x1) General stair comments: mild instability, very cautious and slow  Wheelchair Mobility    Modified Rankin (Stroke Patients Only)       Balance Overall balance assessment: Needs assistance Sitting-balance support: Feet supported Sitting balance-Leahy Scale: Good Sitting balance - Comments: pt able to don socks sitting EOB with supervision   Standing balance support: During functional activity;No upper extremity supported Standing balance-Leahy Scale: Fair                               Pertinent Vitals/Pain Pain Assessment: 0-10 Pain Score: 5  Pain Location: bilateral LEs (restless leg syndrome) Pain Descriptors / Indicators: Sore Pain Intervention(s): Monitored during session;Repositioned    Home Living Family/patient expects to be discharged to:: Private residence Living Arrangements: Spouse/significant other Available Help at Discharge: Family;Available PRN/intermittently Type of Home: House Home Access: Stairs to enter Entrance Stairs-Rails: None Entrance Stairs-Number of Steps: 3 Home Layout: Two level;Able to live on main level with bedroom/bathroom Home Equipment: Gilford Rile - 2 wheels;Shower seat      Prior Function Level of Independence: Independent               Hand Dominance        Extremity/Trunk Assessment   Upper Extremity Assessment Upper Extremity Assessment: Defer to OT evaluation    Lower Extremity Assessment Lower  Extremity Assessment: Overall WFL for tasks assessed       Communication   Communication: Expressive difficulties (word finding difficulties)  Cognition Arousal/Alertness: Awake/alert Behavior During Therapy: WFL for tasks  assessed/performed Overall Cognitive Status: Impaired/Different from baseline Area of Impairment: Memory;Problem solving                     Memory: Decreased short-term memory       Problem Solving: Slow processing General Comments: delayed responses and difficulty with word finding      General Comments      Exercises     Assessment/Plan    PT Assessment Patient needs continued PT services  PT Problem List Decreased balance;Decreased mobility;Decreased coordination;Decreased knowledge of use of DME;Decreased safety awareness       PT Treatment Interventions DME instruction;Gait training;Stair training;Functional mobility training;Therapeutic activities;Therapeutic exercise;Balance training;Neuromuscular re-education;Patient/family education;Cognitive remediation    PT Goals (Current goals can be found in the Care Plan section)  Acute Rehab PT Goals Patient Stated Goal: return to PLOF PT Goal Formulation: With patient Time For Goal Achievement: 08/28/17 Potential to Achieve Goals: Good    Frequency Min 3X/week   Barriers to discharge        Co-evaluation               AM-PAC PT "6 Clicks" Daily Activity  Outcome Measure Difficulty turning over in bed (including adjusting bedclothes, sheets and blankets)?: None Difficulty moving from lying on back to sitting on the side of the bed? : None Difficulty sitting down on and standing up from a chair with arms (e.g., wheelchair, bedside commode, etc,.)?: A Little Help needed moving to and from a bed to chair (including a wheelchair)?: A Little Help needed walking in hospital room?: A Little Help needed climbing 3-5 steps with a railing? : A Little 6 Click Score: 20    End of Session Equipment Utilized During Treatment: Gait belt Activity Tolerance: Patient tolerated treatment well Patient left: in bed;with call bell/phone within reach;with family/visitor present Nurse Communication: Mobility status PT  Visit Diagnosis: Unsteadiness on feet (R26.81);Other abnormalities of gait and mobility (R26.89)    Time: 6222-9798 PT Time Calculation (min) (ACUTE ONLY): 23 min   Charges:   PT Evaluation $PT Eval Moderate Complexity: 1 Mod PT Treatments $Gait Training: 8-22 mins   PT G Codes:   PT G-Codes **NOT FOR INPATIENT CLASS** Functional Assessment Tool Used: Clinical judgement;AM-PAC 6 Clicks Basic Mobility Functional Limitation: Mobility: Walking and moving around Mobility: Walking and Moving Around Current Status (X2119): At least 20 percent but less than 40 percent impaired, limited or restricted Mobility: Walking and Moving Around Goal Status [phone]): 0 percent impaired, limited or restricted    [facility], PT, DPT [place] 08/14/2017, 9:33 AM

## 2017-08-14 NOTE — Evaluation (Signed)
Occupational Therapy Evaluation and Discharge Patient Details Name: Grace Schmitt MRN: [id number] DOB: [birth year] Today's Date: 08/14/2017    History of Present Illness Pt is a 71 y/o female admitted secondary to confusion, acute metabolic encephalopathy. Awaiting neuro consult. PMH including but not limited to fibromyalgia and RLS.   Clinical Impression   Pt reports she was independent with ADL PTA. Currently pt overall min guard-supervision for ADL and functional mobility. Pt presenting with mild balance deficits, word finding difficulties, and slow processing. Pt planning to d/c home with supervision from family. No further acute OT needs identified; signing off at this time. Please re-consult if needs change. Thank you for this referral.   Follow Up Recommendations  No OT follow up;Supervision/Assistance - 24 hour (initially)    Equipment Recommendations  None recommended by OT    Recommendations for Other Services       Precautions / Restrictions Precautions Precautions: None Restrictions Weight Bearing Restrictions: No      Mobility Bed Mobility Overal bed mobility: Modified Independent                Transfers Overall transfer level: Needs assistance Equipment used: None Transfers: Sit to/from Stand Sit to Stand: Supervision         General transfer comment: for safety initially, then mod I    Balance Overall balance assessment: Needs assistance Sitting-balance support: Feet supported;No upper extremity supported Sitting balance-Leahy Scale: Normal Sitting balance - Comments: pt able to don socks sitting EOB with supervision   Standing balance support: No upper extremity supported;During functional activity Standing balance-Leahy Scale: Good                             ADL either performed or assessed with clinical judgement   ADL Overall ADL's : Needs assistance/impaired Eating/Feeding: Set up;Sitting   Grooming:  Supervision/safety;Standing;Brushing hair;Wash/dry hands;Oral care   Upper Body Bathing: Set up;Sitting   Lower Body Bathing: Supervison/ safety;Sit to/from stand   Upper Body Dressing : Set up;Sitting   Lower Body Dressing: Supervision/safety;Sit to/from stand   Toilet Transfer: Min guard;Ambulation;Regular Toilet       Tub/ Banker: Min guard;Tub transfer;Ambulation Tub/Shower Transfer Details (indicate cue type and reason): Simulated in room; no physical assist needed. Pt reports he has grab bars in shower; plans to hold for balance Functional mobility during ADLs: Min guard       Vision Baseline Vision/History: Wears glasses Wears Glasses: At all times Patient Visual Report: Other (comment) ("just not as clear as normal") Vision Assessment?: No apparent visual deficits Additional Comments: During functional activities; no appartent visual deficits.     Perception     Praxis      Pertinent Vitals/Pain Pain Assessment: Faces Pain Score: 5  Faces Pain Scale: Hurts little more Pain Location: bil LEs Pain Descriptors / Indicators: Sore Pain Intervention(s): Monitored during session;Limited activity within patient's tolerance     Hand Dominance Right   Extremity/Trunk Assessment Upper Extremity Assessment Upper Extremity Assessment: Overall WFL for tasks assessed   Lower Extremity Assessment Lower Extremity Assessment: Defer to PT evaluation   Cervical / Trunk Assessment Cervical / Trunk Assessment: Normal   Communication Communication Communication: Expressive difficulties (word finding difficulties)   Cognition Arousal/Alertness: Awake/alert Behavior During Therapy: WFL for tasks assessed/performed Overall Cognitive Status: Impaired/Different from baseline Area of Impairment: Problem solving  Memory: Decreased short-term memory       Problem Solving: Slow processing General Comments: delayed responses and difficulty  with word finding   General Comments       Exercises     Shoulder Instructions      Home Living Family/patient expects to be discharged to:: Private residence Living Arrangements: Spouse/significant other Available Help at Discharge: Family;Available PRN/intermittently Type of Home: House Home Access: Stairs to enter [employer] of Steps: 3 Entrance Stairs-Rails: None Home Layout: Two level;Able to live on main level with bedroom/bathroom Alternate Level Stairs-Number of Steps: flight   Bathroom Shower/Tub: Tub/shower unit;Curtain   Bathroom Toilet: Standard     Home Equipment: Environmental consultant - 2 wheels;Shower seat;Grab bars - tub/shower          Prior Functioning/Environment Level of Independence: Independent                 OT Problem List:        OT Treatment/Interventions:      OT Goals(Current goals can be found in the care plan section) Acute Rehab OT Goals Patient Stated Goal: return to PLOF OT Goal Formulation: All assessment and education complete, DC therapy  OT Frequency:     Barriers to D/C:            Co-evaluation              AM-PAC PT "6 Clicks" Daily Activity     Outcome Measure Help from another person eating meals?: None Help from another person taking care of personal grooming?: A Little Help from another person toileting, which includes using toliet, bedpan, or urinal?: A Little Help from another person bathing (including washing, rinsing, drying)?: A Little Help from another person to put on and taking off regular upper body clothing?: None Help from another person to put on and taking off regular lower body clothing?: A Little 6 Click Score: 20   End of Session    Activity Tolerance: Patient tolerated treatment well Patient left: in bed;with call bell/phone within reach;with family/visitor present  OT Visit Diagnosis: Unsteadiness on feet (R26.81)                Time: 8546-2703 OT Time Calculation (min): 20  min Charges:  OT General Charges $OT Visit: 1 Procedure OT Evaluation $OT Eval Moderate Complexity: 1 Procedure G-Codes: OT G-codes **NOT FOR INPATIENT CLASS** Functional Assessment Tool Used: Clinical judgement Functional Limitation: Self care Self Care Current Status (J0093): At least 1 percent but less than 20 percent impaired, limited or restricted Self Care Goal Status (G1829): At least 1 percent but less than 20 percent impaired, limited or restricted Self Care Discharge Status [phone]): At least 1 percent but less than 20 percent impaired, limited or restricted   Mel Almond A. Ulice Brilliant, M.S., OTR/L Pager: [place] 08/14/2017, 10:23 AM

## 2017-08-14 NOTE — Progress Notes (Signed)
Patient ID: Grace Schmitt, female   DOB: 1946-11-16, 71 y.o.   MRN: [id number]                                                                PROGRESS NOTE                                                                                                                                                                                                             Patient Demographics:    Grace Schmitt, is a 71 y.o. female, DOB - Feb 19, 1946, JJH:[id number]  Admit date - 08/13/2017   Admitting Physician Ejiroghene Arlyce Dice, MD  Outpatient Primary MD for the patient is Marletta Lor, MD  LOS - 0  Outpatient Specialists:  Chief Complaint  Patient presents with  . Altered Mental Status       Brief Narrative  71 y.o. female with a past medical history significant for fibromyalgia and restless leg syndrome who presents with few weeks progressive forgetfulness, now acute word-finding difficulties.  Patient provides her own history supplemented by family at bedside, although, because of the nature of her complaint, the history provided is confusing.  Family think the patient was in her normal state (lives with husband, still drives, no memory concerns, independent) until a few weeks ago when she started to "talk out of her head" occasionally, and seem confused.  Husband actually maybe took her keys away (she's never had problems driving before).  There was some concern that this was related to her methadone, although she is on a stable dose, family know of no other medicine changes recently.  Patient denies medicine or dose changes recently.  Then, in the last 2 days, she has had terrible pains throughout the whole of her both legs.  This is similar in character to her typical chronic leg pain (she calls it fibromyalgia and restless legs).  Simultaneously, she was noticing that she had gradually waxing and waning confusion and word-finding difficulties throughout the day.  There was no  unilateral weakness, sensation loss, slurred speech, vision changes, syncope, seizures.  +dry mouth, +cold intolerance maybe.  Negative for fever, cough, sputum, dysuria, hematuria, frequency, urgency.  ED course: -Temp 99.51F, heart rate 88, respirations and pulse ox normal, BP 161/88  -Na 137, K 3.3, Cr 0.98, WBC  8.5K, Hgb 12.4 -Calcium 11.5 although this is chronic -INR normal, PTT slightly high -UDS clear -UA without pyuria, hematuria -Alcohol negative -Troponin negative -CT head unremarkable -Case was discussed with Neurology who recommended transfer to [facility] and stroke workup with MRI brain    Subjective:    Otis Deboy today has feeling slightly stronger.  Mental status improved per family. Speech normalized  No headache, No chest pain, No abdominal pain - No Nausea, No new weakness tingling or numbness, No Cough - SOB.    Assessment  & Plan :    Principal Problem:   Encephalopathy Active Problems:   Hypothyroidism   Depression   Fibromyalgia   1. Acute metabolic encephalopathy vs expressive aphasia:  To me she appears to have more of an encephalopathy, probably on some underlying undiagnosed MCI, rather than aphasia.  Differential includes encephalopathy from effects of methadone, meds (although no changes recently, so not sure what it would be), depression, hypothyroidism, dehydration, liver disease.  No evidence of infection.  Ca not high enough to cause the current symptoms, probably. -Admit to telemetry -Neuro checks, NIHSS per protocol -Daily aspirin 325 mg -Lipids, hemoglobin A1c -Obtain MRI -Echocardiogram ordered -PT/OT/SLP consultation -Consult to Neurology, appreciate recommendations -Check TSH, LFTs   2. Hypercalcemia:  Chronic.  Not clear this has been worked up before. -Check PTH, Ca, will add PTH rp and esr for am,  Consider spep, upep if calcium elevated.  Pt told to stop oral calcium,    3. Hypothyroidism:  - Check TSH -Continue  levothyroxine  4. Restless leg syndrome, fibromyalgia:  -Continue home methadone nightly -Hold duloxetine, patient unsure of medication list   5. Hypokalemia Replete, check cmp in am     DVT prophylaxis: Lovenox  Code Status: FULL  Family Communication:  family at bedside Disposition Plan: Anticipate work up as above and consult to ancillary services.  Expect discharge within 1-2 days. Consults called: None overnight Admission status: Telemetry, OBS status  Core measures: -VTE prophylaxis ordered at time of admission -Aspirin ordered at admission -Atrial fibrillation: not  -tPA not given because of outside stroke window -Dysphagia screen ordered in ER -Lipids ordered -PT eval ordered -None smoker    Lab Results  Component Value Date   PLT 264 08/13/2017     Anti-infectives    None        Objective:   Vitals:   08/13/17 2341 08/14/17 0200 08/14/17 0400 08/14/17 0600  BP: (!) 159/72 139/74 138/73 121/60  Pulse: 64 64 69 70  Resp: _0 Temp: 98.6 F (37 C) 98.8 F (37.1 C) 97.9 F (36.6 C) 98.8 F (37.1 C)  TempSrc: Oral Oral Oral Oral  SpO2: 96% 95% 93% 96%  Weight: 76.3 kg (168 lb 3.2 oz)     Height: _1  (1.6 m)       Wt Readings from Last 3 Encounters:  08/13/17 76.3 kg (168 lb 3.2 oz)  08/12/17 77.1 kg (170 lb)  07/13/17 79.8 kg (176 lb)     Intake/Output Summary (Last 24 hours) at 08/14/17 0755 Last data filed at 08/14/17 0400  Gross per 24 hour  Intake           228.33 ml  Output                0 ml  Net           228.33 ml     Physical Exam Speech fluent Awake Alert, Oriented X 3,  No new F.N deficits, Normal affect Decker.AT,PERRAL Supple Neck,No JVD, No cervical lymphadenopathy appriciated.  Symmetrical Chest wall movement, Good air movement bilaterally, CTAB RRR,No Gallops,Rubs or new Murmurs, No Parasternal Heave +ve B.Sounds, Abd Soft, No tenderness, No organomegaly appriciated, No rebound - guarding or  rigidity. No Cyanosis, Clubbing or edema, No new Rash or bruise      Data Review:    CBC  Recent Labs Lab 08/13/17 1904  WBC 8.5  HGB 12.4  HCT 37.4  PLT 264  MCV 90.6  MCH 30.0  MCHC 33.2  RDW 13.6  LYMPHSABS 1.6  MONOABS 0.9  EOSABS 0.4  BASOSABS 0.1    Chemistries   Recent Labs Lab 08/13/17 1904 08/14/17 0056 08/14/17 0130  NA 137  --   --   K 3.3*  --   --   CL 101  --   --   CO2 26  --   --   GLUCOSE 105*  --   --   BUN 20  --   --   CREATININE 0.98  --   --   CALCIUM 11.5*  --   --   MG  --  2.0  --   AST 27  --  24  ALT 21  --  19  ALKPHOS 64  --  59  BILITOT 0.6  --  0.7   ------------------------------------------------------------------------------------------------------------------  Recent Labs  08/14/17 0056  CHOL 192  HDL 55  LDLCALC 117*  TRIG 100  CHOLHDL 3.5    Lab Results  Component Value Date   HGBA1C 5.7 (H) 08/14/2017   ------------------------------------------------------------------------------------------------------------------  Recent Labs  08/14/17 0056  TSH 4.114   ------------------------------------------------------------------------------------------------------------------ No results for input(s): VITAMINB12, FOLATE, FERRITIN, TIBC, IRON, RETICCTPCT in the last 72 hours.  Coagulation profile  Recent Labs Lab 08/13/17 1904  INR 1.05    No results for input(s): DDIMER in the last 72 hours.  Cardiac Enzymes  Recent Labs Lab 08/13/17 2016  TROPONINI <0.03   ------------------------------------------------------------------------------------------------------------------ No results found for: BNP  Inpatient Medications  Scheduled Meds: . enoxaparin (LOVENOX) injection  40 mg Subcutaneous Daily  . levothyroxine  50 mcg Oral QAC breakfast  . methadone  7.5 mg Oral QHS   Continuous Infusions: . 0.9 % NaCl with KCl 20 mEq / L 100 mL/hr at 08/14/17 0143   PRN Meds:.acetaminophen **OR**  acetaminophen (TYLENOL) oral liquid 160 mg/5 mL **OR** acetaminophen, senna-docusate  Micro Results No results found for this or any previous visit (from the past 240 hour(s)).  Radiology Reports Ct Head Wo Contrast  Result Date: 08/13/2017 CLINICAL DATA:  Altered level of consciousness.  Headache. EXAM: CT HEAD WITHOUT CONTRAST TECHNIQUE: Contiguous axial images were obtained from the base of the skull through the vertex without intravenous contrast. COMPARISON:  Head CT 10/20/2016 FINDINGS: Brain: Stable degree of atrophy and chronic small vessel ischemia.No intracranial hemorrhage, mass effect, or midline shift. No hydrocephalus. The basilar cisterns are patent. No evidence of territorial infarct. No extra-axial or intracranial fluid collection. Vascular: Atherosclerosis of skullbase vasculature without hyperdense vessel or abnormal calcification. Skull: No fracture or focal lesion. Sinuses/Orbits: Improved left mastoid air cell opacification with mild residual in the lower portion. Right mastoid air cells are clear. Paranasal sinuses are well-aerated. Other: None. IMPRESSION: 1. No acute intracranial abnormality. Stable atrophy and chronic small vessel ischemia. 2. Improved left mastoid air cell opacification. Electronically Signed   By: Jeb Levering M.D.   On: 08/13/2017 20:06    Time Spent in  minutes  30   Jani Gravel M.D on 08/14/2017 at 7:55 AM  Between 7am to 7pm - Pager - [phone]  After 7pm go to [email or web address] - password [facility]  [facility] -  Office  [phone]

## 2017-08-14 NOTE — H&P (Addendum)
History and Physical  Patient Name: Grace Schmitt     XKG:[id number]    DOB: [birth year]    DOA: 08/13/2017 PCP: Marletta Lor, MD   Patient coming from: Home --> [facility]     Chief Complaint: Disorientation, word-finding difficulties  HPI: Grace Schmitt is a 71 y.o. female with a past medical history significant for fibromyalgia and restless leg syndrome who presents with few weeks progressive forgetfulness, now acute word-finding difficulties.  Patient provides her own history supplemented by family at bedside, although, because of the nature of her complaint, the history provided is confusing.  Family think the patient was in her normal state (lives with husband, still drives, no memory concerns, independent) until a few weeks ago when she started to "talk out of her head" occasionally, and seem confused.  Husband actually maybe took her keys away (she's never had problems driving before).  There was some concern that this was related to her methadone, although she is on a stable dose, family know of no other medicine changes recently.  Patient denies medicine or dose changes recently.  Then, in the last 2 days, she has had terrible pains throughout the whole of her both legs.  This is similar in character to her typical chronic leg pain (she calls it fibromyalgia and restless legs).  Simultaneously, she was noticing that she had gradually waxing and waning confusion and word-finding difficulties throughout the day.  There was no unilateral weakness, sensation loss, slurred speech, vision changes, syncope, seizures.  +dry mouth, +cold intolerance maybe.  Negative for fever, cough, sputum, dysuria, hematuria, frequency, urgency.  ED course: -Temp 99.84F, heart rate 88, respirations and pulse ox normal, BP 161/88  -Na 137, K 3.3, Cr 0.98, WBC 8.5K, Hgb 12.4 -Calcium 11.5 although this is chronic -INR normal, PTT slightly high -UDS clear -UA without pyuria, hematuria -Alcohol  negative -Troponin negative -CT head unremarkable -Case was discussed with Neurology who recommended transfer to [facility] and stroke workup with MRI brain     Review of systems:  Review of Systems  Constitutional: Negative for chills and fever.  Eyes: Negative for blurred vision and double vision.  Respiratory: Negative for cough and shortness of breath.   Cardiovascular: Negative for chest pain, palpitations, orthopnea, claudication and leg swelling.  Gastrointestinal: Negative for abdominal pain, nausea and vomiting.  Genitourinary: Negative for dysuria, flank pain, frequency and urgency.  Musculoskeletal: Positive for myalgias (legs).  Neurological: Positive for weakness (gneneralized) and headaches. Negative for dizziness, tingling, tremors, sensory change, speech change, focal weakness, seizures and loss of consciousness.  Psychiatric/Behavioral: Positive for memory loss.  All other systems reviewed and are negative.        Past Medical History:  Diagnosis Date  . Allergy    prn claritin  . Fibromyalgia   . Hx of colonic polyps serrated and adenomatous 05/28/2005  . Hyperlipidemia   . Ischemic chest pain   . PONV (postoperative nausea and vomiting)   . Restless leg syndrome   . Thyroid disease     Past Surgical History:  Procedure Laterality Date  . COLONOSCOPY  October 2011  . TONSILLECTOMY  1951  . TUBAL LIGATION  1978   x2    Social History: Patient lives with her husband.  Patient walks unassisted.  She is not a smoker.    Allergies  Allergen Reactions  . Phenergan [Promethazine Hcl] Other (See Comments)    Pt has restless leg syndrome and the phenergan cause the RLS  to worsen  Family history: family history includes Dementia in her mother; Heart disease in her mother; Hypertension in her brother.  Prior to Admission medications   Medication Sig Start Date End Date Taking? Authorizing Provider  Calcium Carbonate-Vitamin D (CALCIUM 600+D) 600-400  MG-UNIT per tablet Take 2 tablets by mouth daily.    Yes [provider]  levothyroxine (SYNTHROID, LEVOTHROID) 50 MCG tablet TAKE 1 TABLET (50 MCG TOTAL) BY MOUTH DAILY. 02/01/17  Yes Marletta Lor, MD  methadone (DOLOPHINE) 5 MG tablet Take 7.5 mg by mouth daily.    Yes [provider]  aspirin 81 MG tablet Take 81 mg by mouth daily.      [provider]  aspirin-acetaminophen-caffeine (EXCEDRIN MIGRAINE) [phone] MG tablet Take 2 tablets by mouth daily as needed (morning pain).    [provider]  Azelaic Acid 15 % cream After skin is thoroughly washed and patted dry, gently but thoroughly massage a thin film of azelaic acid cream into the affected area twice daily, in the morning and evening. 04/30/15   Marletta Lor, MD  co-enzyme Q-10 (CO Q 10) 30 MG capsule Take 60 mg by mouth daily.      [provider]  DULoxetine (CYMBALTA) 30 MG capsule Take by mouth. 10/24/16 10/24/17  [provider]  Glucosamine-Chondroit-Vit C-Mn (GLUCOSAMINE-CHONDROITIN) CAPS Take 1 capsule by mouth daily.      [provider]  HYDROcodone-acetaminophen (NORCO/VICODIN) 5-325 MG tablet Take 1-2 tablets by mouth every 6 (six) hours as needed for moderate pain. Patient not taking: Reported on 07/13/2017 10/20/16   Fredia Sorrow, MD  loratadine (CLARITIN) 10 MG tablet Take 10 mg by mouth daily.      [provider]  Multiple Vitamin (MULTIVITAMIN) tablet Take 1 tablet by mouth daily.      [provider]  ondansetron (ZOFRAN ODT) 4 MG disintegrating tablet Take 1 tablet (4 mg total) by mouth every 8 (eight) hours as needed for nausea or vomiting. Patient not taking: Reported on 07/13/2017 06/29/17   Marletta Lor, MD     Physical Exam: BP (!) 159/72 (BP Location: Left Arm)   Pulse 64   Temp 98.6 F (37 C) (Oral)   Resp 16   Ht 5\' 3"  (1.6 m)   Wt 76.3 kg (168 lb 3.2 oz)   SpO2 96%   BMI 29.80 kg/m  General  appearance: Well-developed, elderly adult female, alert and in mild distress from feeling uncomfortable, disorientation?  Seems frustated with word finding difficulties sometimes.   Eyes: Anicteric, conjunctiva pink, lids and lashes normal. PERRL.    ENT: No nasal deformity, discharge, epistaxis.  Hearing normal. OP tacky dry without lesions.   Dentition normal. Lymph: No cervical, supraclavicular or axillary lymphadenopathy. Skin: Warm and dry.  No jaundice.  No suspicious rashes or lesions. Cardiac: RRR, nl S1-S2, no murmurs appreciated.  Capillary refill is brisk.  JVP not visible.  No LE edema.  Radial and DP pulses 2+ and symmetric.   Respiratory: Normal respiratory rate and rhythm.  CTAB without rales or wheezes. GI: Abdomen soft without rigidity.  No TTP. No ascites, distension, no hepatosplenomegaly.   MSK: No deformities or effusions. Neuro: Pupils are 4 mm and reactive to 3 mm. Extraocular movements are intact, without nystagmus. Cranial nerve 5 is within normal limits. Cranial nerve 7 is symmetrical. Cranial nerve 8 is within normal limits. Cranial nerves 9 and 10 reveal equal palate elevation. Cranial nerve 11 reveals sternocleidomastoid strong. Cranial nerve 12 is midline.  I do not note a deficit in motor strength testing in the upper and lower extremities bilaterally with normal motor, tone and bulk. Romberg maneuver is negative for pathology. Finger-to-nose testing is within normal limits. Speech is fluent. Naming is grossly intact (of "pen", "shoe", "phone", etc).  Only 1/3 three-item recall at 3 minutes Attention span and concentration are within normal limits.   Psych: The patient is oriented to place and person, day of week, but can't remember month.  This and just a generalized cognitive slowing are her only findings. Behavior appropriate.  Affect blunted.   No evidence of aural or visual hallucinations or delusions.       Labs on Admission:  I have personally reviewed  following labs and imaging studies: CBC:  Recent Labs Lab 08/13/17 1904  WBC 8.5  NEUTROABS 5.6  HGB 12.4  HCT 37.4  MCV 90.6  PLT 161   Basic Metabolic Panel:  Recent Labs Lab 08/13/17 1904  NA 137  K 3.3*  CL 101  CO2 26  GLUCOSE 105*  BUN 20  CREATININE 0.98  CALCIUM 11.5*   GFR: Estimated Creatinine Clearance: 50.8 mL/min (by C-G formula based on SCr of 0.98 mg/dL). Liver Function Tests:  Recent Labs Lab 08/13/17 1904  AST 27  ALT 21  ALKPHOS 64  BILITOT 0.6  PROT 7.7  ALBUMIN 4.2   No results for input(s): LIPASE, AMYLASE in the last 168 hours. No results for input(s): AMMONIA in the last 168 hours. Coagulation Profile:  Recent Labs Lab 08/13/17 1904  INR 1.05   Cardiac Enzymes:  Recent Labs Lab 08/13/17 2016  TROPONINI <0.03   BNP (last 3 results) No results for input(s): PROBNP in the last 8760 hours. HbA1C: No results for input(s): HGBA1C in the last 72 hours. CBG: No results for input(s): GLUCAP in the last 168 hours. Lipid Profile: No results for input(s): CHOL, HDL, LDLCALC, TRIG, CHOLHDL, LDLDIRECT in the last 72 hours. Thyroid Function Tests: No results for input(s): TSH, T4TOTAL, FREET4, T3FREE, THYROIDAB in the last 72 hours. Anemia Panel: No results for input(s): VITAMINB12, FOLATE, FERRITIN, TIBC, IRON, RETICCTPCT in the last 72 hours. Sepsis Labs:  Invalid input(s): PROCALCITONIN, LACTICIDVEN No results found for this or any previous visit (from the past 240 hour(s)).    Radiological Exams on Admission: Personally reviewed CT report: Ct Head Wo Contrast  Result Date: 08/13/2017 CLINICAL DATA:  Altered level of consciousness.  Headache. EXAM: CT HEAD WITHOUT CONTRAST TECHNIQUE: Contiguous axial images were obtained from the base of the skull through the vertex without intravenous contrast. COMPARISON:  Head CT 10/20/2016 FINDINGS: Brain: Stable degree of atrophy and chronic small vessel ischemia.No intracranial  hemorrhage, mass effect, or midline shift. No hydrocephalus. The basilar cisterns are patent. No evidence of territorial infarct. No extra-axial or intracranial fluid collection. Vascular: Atherosclerosis of skullbase vasculature without hyperdense vessel or abnormal calcification. Skull: No fracture or focal lesion. Sinuses/Orbits: Improved left mastoid air cell opacification with mild residual in the lower portion. Right mastoid air cells are clear. Paranasal sinuses are well-aerated. Other: None. IMPRESSION: 1. No acute intracranial abnormality. Stable atrophy and chronic small vessel ischemia. 2. Improved left mastoid air cell opacification. Electronically Signed   By: Jeb Levering M.D.   On: 08/13/2017 20:06     EKG: Independently reviewed. Rate 77, QTc 445, normal sinus rhythm.    Assessment/Plan  1. Acute metabolic encephalopathy vs expressive aphasia:  To me she appears to have more of an encephalopathy, probably on some  underlying undiagnosed MCI, rather than aphasia.  Differential includes encephalopathy from effects of methadone, meds (although no changes recently, so not sure what it would be), depression, hypothyroidism, dehydration, liver disease.  No evidence of infection.  Ca not high enough to cause the current symptoms, probably. -Admit to telemetry -Neuro checks, NIHSS per protocol -Daily aspirin 325 mg -Lipids, hemoglobin A1c -Obtain MRI -Echocardiogram ordered -PT/OT/SLP consultation -Consult to Neurology, appreciate recommendations -Check TSH, LFTs   2. Hypercalcemia:  Chronic.  Not clear this has been worked up before. -Check PTH, Ca  3. Hypothyroidism:  - Check TSH -Continue levothyroxine  4. Restless leg syndrome, fibromyalgia:  -Continue home methadone nightly -Hold duloxetine, patient unsure of medication list       DVT prophylaxis: Lovenox  Code Status: FULL  Family Communication: Son, daughter, grandson at bedside  Disposition Plan:  Anticipate work up as above and consult to ancillary services.  Expect discharge within 1-2 days. Consults called: None overnight Admission status: Telemetry, OBS status  Core measures: -VTE prophylaxis ordered at time of admission -Aspirin ordered at admission -Atrial fibrillation: not  -tPA not given because of outside stroke window -Dysphagia screen ordered in ER -Lipids ordered -PT eval ordered -None smoker   Medical decision making: Patient seen at 12:58 AM on 08/14/2017. What exists of the patient's chart was reviewed in depth and summarized above.  Clinical condition: stable.       Edwin Dada [facility] Pager [phone]

## 2017-08-14 NOTE — Progress Notes (Signed)
VASCULAR LAB PRELIMINARY  PRELIMINARY  PRELIMINARY  PRELIMINARY  Carotid duplex completed.    Preliminary report:  1-39% ICA plaquing. Vertebral artery flow is antegrade.   Kalsey Lull, RVT 08/14/2017, 7:05 PM

## 2017-08-14 NOTE — Progress Notes (Signed)
  Echocardiogram 2D Echocardiogram has been performed.  Grace Schmitt 08/14/2017, 5:43 PM

## 2017-08-14 NOTE — Progress Notes (Signed)
Pt not compliant with order for 1 assist when ambulating; However, I have observed her walking with her husband and she is steady and stable. Advised pt to call when ambulating, or to make sure family is with her and she is wearing non slip socks.

## 2017-08-15 DIAGNOSIS — G9341 Metabolic encephalopathy: Secondary | ICD-10-CM | POA: Diagnosis present

## 2017-08-15 DIAGNOSIS — E876 Hypokalemia: Secondary | ICD-10-CM | POA: Diagnosis not present

## 2017-08-15 DIAGNOSIS — E7439 Other disorders of intestinal carbohydrate absorption: Secondary | ICD-10-CM | POA: Diagnosis present

## 2017-08-15 DIAGNOSIS — G2581 Restless legs syndrome: Secondary | ICD-10-CM | POA: Diagnosis present

## 2017-08-15 DIAGNOSIS — G9340 Encephalopathy, unspecified: Secondary | ICD-10-CM | POA: Diagnosis not present

## 2017-08-15 DIAGNOSIS — E039 Hypothyroidism, unspecified: Secondary | ICD-10-CM | POA: Diagnosis not present

## 2017-08-15 DIAGNOSIS — F329 Major depressive disorder, single episode, unspecified: Secondary | ICD-10-CM | POA: Diagnosis not present

## 2017-08-15 DIAGNOSIS — Z86010 Personal history of colonic polyps: Secondary | ICD-10-CM | POA: Diagnosis not present

## 2017-08-15 DIAGNOSIS — Z8249 Family history of ischemic heart disease and other diseases of the circulatory system: Secondary | ICD-10-CM | POA: Diagnosis not present

## 2017-08-15 DIAGNOSIS — Z820 Family history of epilepsy and other diseases of the nervous system: Secondary | ICD-10-CM | POA: Diagnosis not present

## 2017-08-15 DIAGNOSIS — M797 Fibromyalgia: Secondary | ICD-10-CM | POA: Diagnosis not present

## 2017-08-15 DIAGNOSIS — M8580 Other specified disorders of bone density and structure, unspecified site: Secondary | ICD-10-CM | POA: Diagnosis present

## 2017-08-15 DIAGNOSIS — E785 Hyperlipidemia, unspecified: Secondary | ICD-10-CM | POA: Diagnosis present

## 2017-08-15 DIAGNOSIS — F1190 Opioid use, unspecified, uncomplicated: Secondary | ICD-10-CM | POA: Diagnosis present

## 2017-08-15 DIAGNOSIS — D649 Anemia, unspecified: Secondary | ICD-10-CM | POA: Diagnosis present

## 2017-08-15 DIAGNOSIS — Z888 Allergy status to other drugs, medicaments and biological substances status: Secondary | ICD-10-CM | POA: Diagnosis not present

## 2017-08-15 LAB — COMPREHENSIVE METABOLIC PANEL
ALT: 16 U/L (ref 14–54)
AST: 19 U/L (ref 15–41)
Albumin: 3.2 g/dL — ABNORMAL LOW (ref 3.5–5.0)
Alkaline Phosphatase: 50 U/L (ref 38–126)
Anion gap: 7 (ref 5–15)
BUN: 10 mg/dL (ref 6–20)
CO2: 22 mmol/L (ref 22–32)
Calcium: 9.9 mg/dL (ref 8.9–10.3)
Chloride: 111 mmol/L (ref 101–111)
Creatinine, Ser: 0.84 mg/dL (ref 0.44–1.00)
GFR calc Af Amer: >60 mL/min (ref >60)
GFR calc non Af Amer: >60 mL/min (ref >60)
Glucose, Bld: 97 mg/dL (ref 65–99)
Potassium: 3.7 mmol/L (ref 3.5–5.1)
Sodium: 140 mmol/L (ref 135–145)
Total Bilirubin: 0.7 mg/dL (ref 0.3–1.2)
Total Protein: 6.8 g/dL (ref 6.5–8.1)

## 2017-08-15 LAB — VAS US CAROTID
LEFT ECA DIAS: -9 cm/s
LEFT VERTEBRAL DIAS: -13 cm/s
Left CCA dist dias: 15 cm/s
Left CCA dist sys: 64 cm/s
Left CCA prox dias: 12 cm/s
Left CCA prox sys: 65 cm/s
Left ICA dist dias: -13 cm/s
Left ICA dist sys: -54 cm/s
Left ICA prox dias: -13 cm/s
Left ICA prox sys: -54 cm/s
RIGHT ECA DIAS: -7 cm/s
RIGHT VERTEBRAL DIAS: -8 cm/s
Right CCA prox dias: 10 cm/s
Right CCA prox sys: 60 cm/s
Right cca dist sys: -106 cm/s

## 2017-08-15 LAB — ECHOCARDIOGRAM COMPLETE
E decel time: 239 msec
E/e' ratio: 11.3
FS: 36 % (ref 28–44)
Height: 63 in
IVS/LV PW RATIO, ED: 0.89
LA ID, A-P, ES: 30 mm
LA diam end sys: 30 mm
LA diam index: 1.61 cm/m2
LA vol A4C: 39.1 ml
LA vol index: 19.8 mL/m2
LA vol: 37 mL
LV E/e' medial: 11.3
LV E/e'average: 11.3
LV PW d: 9 mm — AB (ref 0.6–1.1)
LV e' LATERAL: 8.76 cm/s
LVOT SV: 86 mL
LVOT VTI: 27.3 cm
LVOT area: 3.14 cm2
LVOT diameter: 20 mm
LVOT peak grad rest: 6 mmHg
LVOT peak vel: 127 cm/s
Lateral S' vel: 12.5 cm/s
MV Dec: 239
MV Peak grad: 4 mmHg
MV pk A vel: 130 m/s
MV pk E vel: 99 m/s
TAPSE: 25.4 mm
TDI e' lateral: 8.76
TDI e' medial: 7.65
Weight: 2691.2 oz

## 2017-08-15 LAB — PTH, INTACT AND CALCIUM
Calcium, Total (PTH): 11.4 mg/dL — ABNORMAL HIGH (ref 8.7–10.3)
PTH: 11 pg/mL — ABNORMAL LOW (ref 15–65)

## 2017-08-15 LAB — SEDIMENTATION RATE: Sed Rate: 25 mm/hr — ABNORMAL HIGH (ref 0–22)

## 2017-08-15 LAB — CBC
HCT: 35.7 % — ABNORMAL LOW (ref 36.0–46.0)
Hemoglobin: 11.6 g/dL — ABNORMAL LOW (ref 12.0–15.0)
MCH: 29.2 pg (ref 26.0–34.0)
MCHC: 32.5 g/dL (ref 30.0–36.0)
MCV: 89.9 fL (ref 78.0–100.0)
Platelets: 232 10*3/uL (ref 150–400)
RBC: 3.97 MIL/uL (ref 3.87–5.11)
RDW: 13.8 % (ref 11.5–15.5)
WBC: 6.7 10*3/uL (ref 4.0–10.5)

## 2017-08-15 NOTE — Progress Notes (Signed)
Patient ID: Grace Schmitt, female   DOB: 04-07-1946, 71 y.o.   MRN: [id number]                                                                PROGRESS NOTE                                                                                                                                                                                                             Patient Demographics:    Grace Schmitt, is a 71 y.o. female, DOB - [birth year], FBP:[id number]  Admit date - 08/13/2017   Admitting Physician Ejiroghene Arlyce Dice, MD  Outpatient Primary MD for the patient is Marletta Lor, MD  LOS - 0  Outpatient Specialists:  Chief Complaint  Patient presents with  . Altered Mental Status       Brief Narrative 71 y.o.femalewith a past medical history significant for fibromyalgia and restless leg syndromewho presents with few weeks progressive forgetfulness, now acute word-finding difficulties.  Patient provides her own history supplemented by family at bedside, although, because of the nature of her complaint, the history provided is confusing. Family think the patientwas in her normal state (lives with husband, still drives, no memory concerns, independent)until a few weeks ago when she started to "talk out of her head" occasionally, and seem confused. Husband actually maybe took her keys away (she's never had problems driving before). There was some concern that this was related to her methadone, although she is on a stable dose, family know of no other medicine changes recently. Patient denies medicine or dose changes recently.  Then, in the last 2 days, she has had terrible pains throughout the whole of her both legs. This is similar in character to hertypical chronic leg pain (she calls it fibromyalgia and restless legs). Simultaneously, she was noticing that she had gradually waxing and waning confusion and word-finding difficulties throughout the day. There was no  unilateral weakness, sensation loss, slurred speech, vision changes, syncope, seizures. +dry mouth, +cold intolerance maybe. Negative for fever, cough, sputum, dysuria, hematuria, frequency, urgency.  ED course: -Temp 99.54F, heart rate 88, respirations and pulse ox normal, BP 161/88  -Na 137, K 3.3, Cr 0.98, WBC 8.5K, Hgb 12.4 -Calcium 11.5 although this is chronic -INR normal, PTT slightly high -UDS clear -  UA without pyuria, hematuria -Alcohol negative -Troponin negative -CT head unremarkable -Case was discussed with Neurology who recommended transfer to [facility] and stroke workup with MRI brain   Subjective:    Grace Schmitt today feeling better.  Less confusion per pt and family . Speech normal.    No headache, No chest pain, No abdominal pain - No Nausea, No new weakness tingling or numbness, No Cough - SOB.    Assessment  & Plan :    Principal Problem:   Encephalopathy Active Problems:   Hypothyroidism   Depression   Fibromyalgia   Hypercalcemia   1. Acute metabolic encephalopathy vs expressive aphasia: Confusion probably related to hypercalcemia MRI brain negative for CVA Carotid u/s, cardiac echo pending   2. Hypercalcemia: corrected calcium this am still high Labs, PTH, PTHrp, vitamin D, pending Consider spep, upep  If PTH rp high then need CT scan chest/abd/pelvis Calcium supplement stopped,  Continue ns iv for now Check cmp in am along with ionized calcium  3. Hypothyroidism: -Continue levothyroxine  4. Restless leg syndrome, fibromyalgia: -Continue home methadone nightly  5. Hypokalemia resolved check cmp in am  6. Anemia Repeat cbc in am  7. Glucose intolerance Check hga1c in 20months  DVT prophylaxis:Lovenox  Code Status:FULL Family Communication: family at bedside Disposition Plan: if calcium normalized then home tomorrow.  Consults called:Neurology apparently curbsided by admitting physician Admission status:Telemetry,  inpatient  Core measures: -VTE prophylaxis ordered at time of admission -Aspirin ordered at admission -Atrial fibrillation: not  -tPA not given because of outside stroke window -Dysphagia screen ordered in ER -Lipids ordered -PT eval ordered -None smoker       Lab Results  Component Value Date   PLT 232 08/15/2017    Antibiotics  :  none  Anti-infectives    None        Objective:   Vitals:   08/14/17 2100 08/15/17 0210 08/15/17 0500 08/15/17 0833  BP: (!) 157/92 125/66 128/63 (!) 143/64  Pulse: 73 75 65 70  Resp: 18 18 20 15   Temp: 98.4 F (36.9 C) 99.1 F (37.3 C) 98.1 F (36.7 C) 98.1 F (36.7 C)  TempSrc: Oral Oral Oral Oral  SpO2: 97% 95% 94% 97%  Weight:      Height:        Wt Readings from Last 3 Encounters:  08/13/17 76.3 kg (168 lb 3.2 oz)  08/12/17 77.1 kg (170 lb)  07/13/17 79.8 kg (176 lb)     Intake/Output Summary (Last 24 hours) at 08/15/17 0905 Last data filed at 08/14/17 1700  Gross per 24 hour  Intake              480 ml  Output                0 ml  Net              480 ml     Physical Exam  Awake Alert, Oriented X 3, No new F.N deficits, Normal affect [place].AT,PERRAL Supple Neck,No JVD, No cervical lymphadenopathy appriciated.  Symmetrical Chest wall movement, Good air movement bilaterally, CTAB RRR,No Gallops,Rubs or new Murmurs, No Parasternal Heave +ve B.Sounds, Abd Soft, No tenderness, No organomegaly appriciated, No rebound - guarding or rigidity. No Cyanosis, Clubbing or edema, No new Rash or bruise   Speech fluent    Data Review:    CBC  Recent Labs Lab 08/13/17 1904 08/15/17 0435  WBC 8.5 6.7  HGB 12.4 11.6*  HCT 37.4 35.7*  PLT 264 232  MCV 90.6 89.9  MCH 30.0 29.2  MCHC 33.2 32.5  RDW 13.6 13.8  LYMPHSABS 1.6  --   MONOABS 0.9  --   EOSABS 0.4  --   BASOSABS 0.1  --     Chemistries   Recent Labs Lab 08/13/17 1904 08/14/17 0056 08/14/17 0130 08/15/17 0435  NA 137  --   --  140  K 3.3*   --   --  3.7  CL 101  --   --  111  CO2 26  --   --  22  GLUCOSE 105*  --   --  97  BUN 20  --   --  10  CREATININE 0.98  --   --  0.84  CALCIUM 11.5*  --   --  9.9  MG  --  2.0  --   --   AST 27  --  24 19  ALT 21  --  19 16  ALKPHOS 64  --  59 50  BILITOT 0.6  --  0.7 0.7   ------------------------------------------------------------------------------------------------------------------  Recent Labs  08/14/17 0056  CHOL 192  HDL 55  LDLCALC 117*  TRIG 100  CHOLHDL 3.5    Lab Results  Component Value Date   HGBA1C 5.7 (H) 08/14/2017   ------------------------------------------------------------------------------------------------------------------  Recent Labs  08/14/17 0056  TSH 4.114   ------------------------------------------------------------------------------------------------------------------ No results for input(s): VITAMINB12, FOLATE, FERRITIN, TIBC, IRON, RETICCTPCT in the last 72 hours.  Coagulation profile  Recent Labs Lab 08/13/17 1904  INR 1.05    No results for input(s): DDIMER in the last 72 hours.  Cardiac Enzymes  Recent Labs Lab 08/13/17 2016  TROPONINI <0.03   ------------------------------------------------------------------------------------------------------------------ No results found for: BNP  Inpatient Medications  Scheduled Meds: . DULoxetine  30 mg Oral BID  . enoxaparin (LOVENOX) injection  40 mg Subcutaneous Daily  . levothyroxine  50 mcg Oral QAC breakfast  . methadone  7.5 mg Oral QHS   Continuous Infusions: . 0.9 % NaCl with KCl 20 mEq / L 100 mL/hr at 08/14/17 2315   PRN Meds:.acetaminophen **OR** acetaminophen (TYLENOL) oral liquid 160 mg/5 mL **OR** acetaminophen, senna-docusate  Micro Results No results found for this or any previous visit (from the past 240 hour(s)).  Radiology Reports Ct Head Wo Contrast  Result Date: 08/13/2017 CLINICAL DATA:  Altered level of consciousness.  Headache. EXAM: CT  HEAD WITHOUT CONTRAST TECHNIQUE: Contiguous axial images were obtained from the base of the skull through the vertex without intravenous contrast. COMPARISON:  Head CT 10/20/2016 FINDINGS: Brain: Stable degree of atrophy and chronic small vessel ischemia.No intracranial hemorrhage, mass effect, or midline shift. No hydrocephalus. The basilar cisterns are patent. No evidence of territorial infarct. No extra-axial or intracranial fluid collection. Vascular: Atherosclerosis of skullbase vasculature without hyperdense vessel or abnormal calcification. Skull: No fracture or focal lesion. Sinuses/Orbits: Improved left mastoid air cell opacification with mild residual in the lower portion. Right mastoid air cells are clear. Paranasal sinuses are well-aerated. Other: None. IMPRESSION: 1. No acute intracranial abnormality. Stable atrophy and chronic small vessel ischemia. 2. Improved left mastoid air cell opacification. Electronically Signed   By: Jeb Levering M.D.   On: 08/13/2017 20:06   Mr Brain Wo Contrast  Result Date: 08/14/2017 CLINICAL DATA:  Expressive aphasia EXAM: MRI HEAD WITHOUT CONTRAST TECHNIQUE: Multiplanar, multiecho pulse sequences of the brain and surrounding structures were obtained without intravenous contrast. COMPARISON:  CT 08/13/2017 FINDINGS: Brain: Diffusion imaging does not show any  acute or subacute infarction. There are chronic small-vessel ischemic changes throughout the pons. No focal cerebellar finding. Cerebral hemispheres show moderate chronic small-vessel ischemic changes throughout deep and subcortical white matter. Old lacunar infarctions basal ganglia. No large vessel territory infarction. No mass lesion, hemorrhage, hydrocephalus or extra-axial collection. Vascular: Major vessels at the base of the brain show flow. Skull and upper cervical spine: Negative Sinuses/Orbits: Paranasal sinuses are clear. Left mastoid effusion. Orbits negative. Other: None significant IMPRESSION: No  acute brain finding. Chronic small-vessel ischemic changes affecting the pons in the cerebral hemispheres. Left mastoid effusion. Electronically Signed   By: [name] Chimes M.D.   On: 08/14/2017 14:13    Time Spent in minutes  30   Jani Gravel M.D on 08/15/2017 at 9:05 AM  Between 7am to 7pm - Pager - [phone]  After 7pm go to [email or web address] - password [facility]  [facility] -  Office  [phone]

## 2017-08-15 NOTE — Evaluation (Signed)
Speech Language Pathology Evaluation Patient Details Name: Hanifah Royse MRN: [id number] DOB: 22-May-1946 Today's Date: 08/15/2017 Time: 9323-5573 SLP Time Calculation (min) (ACUTE ONLY): 30 min  Problem List:  Patient Active Problem List   Diagnosis Date Noted  . Hypokalemia 08/15/2017  . Hypercalcemia 08/14/2017  . Encephalopathy 08/13/2017  . Osteopenia 06/05/2016  . Restless leg syndrome 03/31/2016  . Fibromyalgia 03/31/2016  . Hyperlipidemia 01/17/2016  . Exertional chest pain   . Chest pain at rest 01/15/2016  . Depression 01/15/2016  . Hypothyroidism 06/04/2014  . Hx of colonic polyps serrated and adenomatous 05/28/2005   Past Medical History:  Past Medical History:  Diagnosis Date  . Allergy    prn claritin  . Fibromyalgia   . Hx of colonic polyps serrated and adenomatous 05/28/2005  . Hyperlipidemia   . Ischemic chest pain   . PONV (postoperative nausea and vomiting)   . Restless leg syndrome   . Thyroid disease    Past Surgical History:  Past Surgical History:  Procedure Laterality Date  . COLONOSCOPY  October 2011  . TONSILLECTOMY  1951  . TUBAL LIGATION  1978   x13   HPI:  71 year old female admitted 08/13/17 with disorientation, word finding difficulty, and forgetfulness. PMH significant for fibromyalgia, RLS.   Assessment / Plan / Recommendation Clinical Impression  COM/COG: The Montreal Cognitive Assessment (MoCA) was administered. Pt scored 28/30 (n=26+/30), indicating performance within functional limits for pt age and level of education (12th grade). Points were lost on executive function, delayed recall, and thought organization subtests.  Despite these test results, pt reports worsening functional deficits over the past 2-3 months, including impaired recall, difficulty with accurate managment of finances, and poor organizational skills. Given these deficits and their impact on pt functioning, independence, and quality of life, recommend consideration  of home health or outpatient therapy to maximize cognition for safety and independence. Test results were reviewed with pt and her family.   DYSPHAGIA: Pt exhibits no overt s/s aspiration with thin liquids, but does report intermittent globus sensation. This raises suspicion for esophageal dysmotility. Pt was encouraged to monitor this issue, and notify MD if it continues or worsens, as esophageal workup may be indicated. Pt was given suggestion to begin meals with a warm beverage to facilitate esophageal function.   No further ST intervention is recommended at this venue. All ST needs can be managed as an outpatient, or via home health if needed.     SLP Assessment  SLP Recommendation/Assessment: All further Speech Language Pathology  needs can be addressed in the next venue of care  SLP Visit Diagnosis: Cognitive communication deficit (R41.841)    Follow Up Recommendations  Home health SLP;Outpatient SLP (if needs arise)       SLP Evaluation Cognition  Overall Cognitive Status: Within Functional Limits for tasks assessed Arousal/Alertness: Awake/alert Orientation Level: Oriented X4 Attention: Focused;Sustained;Selective Focused Attention: Appears intact Sustained Attention: Appears intact Selective Attention: Appears intact Memory:  (immediate recall of 5/5 unrelated words, delayed recall of 4/5) Awareness: Appears intact Problem Solving: Appears intact Executive Function: Reasoning Reasoning: Appears intact Safety/Judgment: Appears intact       Comprehension  Auditory Comprehension Overall Auditory Comprehension: Appears within functional limits for tasks assessed    Expression Expression Primary Mode of Expression: Verbal Verbal Expression Overall Verbal Expression: Appears within functional limits for tasks assessed Written Expression Dominant Hand: Right   Oral / Motor  Oral Motor/Sensory Function Overall Oral Motor/Sensory Function: Within functional limits Motor  Speech Overall  Motor Speech: Appears within functional limits for tasks assessed   GO                   Kensie Susman B. [place], [facility], [place]  Shonna Chock 08/15/2017, 3:38 PM

## 2017-08-16 DIAGNOSIS — M797 Fibromyalgia: Secondary | ICD-10-CM

## 2017-08-16 DIAGNOSIS — E039 Hypothyroidism, unspecified: Secondary | ICD-10-CM

## 2017-08-16 DIAGNOSIS — F329 Major depressive disorder, single episode, unspecified: Secondary | ICD-10-CM

## 2017-08-16 DIAGNOSIS — G9340 Encephalopathy, unspecified: Secondary | ICD-10-CM

## 2017-08-16 DIAGNOSIS — E876 Hypokalemia: Secondary | ICD-10-CM

## 2017-08-16 LAB — COMPREHENSIVE METABOLIC PANEL
ALT: 16 U/L (ref 14–54)
AST: 20 U/L (ref 15–41)
Albumin: 3.6 g/dL (ref 3.5–5.0)
Alkaline Phosphatase: 54 U/L (ref 38–126)
Anion gap: 6 (ref 5–15)
BUN: 9 mg/dL (ref 6–20)
CO2: 25 mmol/L (ref 22–32)
Calcium: 10.1 mg/dL (ref 8.9–10.3)
Chloride: 108 mmol/L (ref 101–111)
Creatinine, Ser: 0.85 mg/dL (ref 0.44–1.00)
GFR calc Af Amer: >60 mL/min (ref >60)
GFR calc non Af Amer: >60 mL/min (ref >60)
Glucose, Bld: 112 mg/dL — ABNORMAL HIGH (ref 65–99)
Potassium: 3.7 mmol/L (ref 3.5–5.1)
Sodium: 139 mmol/L (ref 135–145)
Total Bilirubin: 1 mg/dL (ref 0.3–1.2)
Total Protein: 7.6 g/dL (ref 6.5–8.1)

## 2017-08-16 LAB — CBC
HCT: 37.6 % (ref 36.0–46.0)
Hemoglobin: 12.3 g/dL (ref 12.0–15.0)
MCH: 29.6 pg (ref 26.0–34.0)
MCHC: 32.7 g/dL (ref 30.0–36.0)
MCV: 90.4 fL (ref 78.0–100.0)
Platelets: 236 10*3/uL (ref 150–400)
RBC: 4.16 MIL/uL (ref 3.87–5.11)
RDW: 13.9 % (ref 11.5–15.5)
WBC: 6.8 10*3/uL (ref 4.0–10.5)

## 2017-08-16 MED ORDER — SENNOSIDES-DOCUSATE SODIUM 8.6-50 MG PO TABS: 1.0000 | ORAL_TABLET | Freq: Every evening | ORAL | 0 refills | Status: DC | PRN

## 2017-08-16 NOTE — Care Management Note (Signed)
Case Management Note  Patient Details  Name: Lucee Brissett MRN: [id number] Date of Birth: November 12, 1946  Subjective/Objective:                    Action/Plan: Pt discharging home with orders for [facility] services. CM met with the patient and her husband and provided a list of [place] agencies. She selected [facility]. Santiago Glad with [facility] notified and accepted the referral.  Husband to provide transportation home.   Expected Discharge Date:  08/16/17               Expected Discharge Plan:  [place]  In-House Referral:     Discharge planning Services  CM Consult  Post Acute Care Choice:  Home Health Choice offered to:  Patient  DME Arranged:    DME Agency:     HH Arranged:  PT, OT, Speech Therapy [place] Agency:  [place]  Status of Service:  Completed, signed off  If discussed at [place] of Stay Meetings, dates discussed:    Additional Comments:  Pollie Friar, RN 08/16/2017, 4:00 PM

## 2017-08-16 NOTE — Consult Note (Signed)
[facility] CM Primary Care Navigator  08/16/2017  Grace Schmitt 06-06-1946 [id number]     Met with patient and husband ([name])at the bedside to identify possible discharge needs. Patient reports having confusion, progressive forgetfulness, trouble with word-finding and terrible pains to both of her legs thathad led to this admission.  Patient confirmed Dr. Bluford Kaufmann with [facility] [place] as her primary care provider.   [facility] [place] and [facility] on [address] to obtain medications without difficulty.  Patient reports managing her medications at home with husband's assistance using "pill box" system filled weekly.  Patient mentionedthat she was drivingprior to admission, buthusband will be able to providetransportationto herdoctors'appointments if needed after discharge.   Patient's husband is the primary caregiver at home as stated.  Anticipated discharge plan is home with home health services according to patient.  Patient and husbandvoiced understanding to call primary care provider's officewhen she gets home,for a post discharge follow-up appointment within a week or sooner if needs arise.Patient letter (with PCP's contact number) was provided as theirreminder.  Explained to patient and husband about [facility] CM services available for healthmanagement at home but she denies any needs or concerns at this time.  However, patient opted and verbally agreed to [place] to followup with herrecovery at home.  Referral to [facility] calls made.  Patientvoiced understanding to seekreferral to [facility] care management servicesfrom primary care provider if deemed necessary in the future.   [facility] care management information provided for future needs that she may have.   For questions, please contact:  Dannielle Huh, BSN, RN- [facility] Primary Care Navigator  Telephone: [phone] [place]

## 2017-08-16 NOTE — Progress Notes (Signed)
Physical Therapy Treatment Patient Details Name: Grace Schmitt MRN: [id number] DOB: January 27, 1946 Today's Date: 08/16/2017    History of Present Illness Pt is a 71 y/o female admitted secondary to confusion, acute metabolic encephalopathy. Awaiting neuro consult. PMH including but not limited to fibromyalgia and RLS.    PT Comments    Patient progressing well with mobility. Improvements noted in balance with cues. Educated patient on safety with mobility upon discharge. Patient receptive. Current POC remains appropriate.   Follow Up Recommendations  Home health PT     Equipment Recommendations  None recommended by PT    Recommendations for Other Services       Precautions / Restrictions Precautions Precautions: None Restrictions Weight Bearing Restrictions: No    Mobility  Bed Mobility Overal bed mobility: Modified Independent                Transfers Overall transfer level: Needs assistance Equipment used: None Transfers: Sit to/from Stand Sit to Stand: Supervision         General transfer comment: for safety initially, then mod I  Ambulation/Gait Ambulation/Gait assistance: Supervision Ambulation Distance (Feet): 310 Feet Assistive device: None Gait Pattern/deviations: Step-through pattern;Decreased step length - right;Decreased step length - left;Decreased stride length;Drifts right/left Gait velocity: decreased   General Gait Details: improved stability with VCs for increased cadence   Stairs     Stair Management: One rail Right;Step to pattern;Forwards   General stair comments: mild instability, very cautious and slow  Wheelchair Mobility    Modified Rankin (Stroke Patients Only)       Balance Overall balance assessment: Needs assistance Sitting-balance support: Feet supported;No upper extremity supported Sitting balance-Leahy Scale: Normal     Standing balance support: No upper extremity supported;During functional  activity Standing balance-Leahy Scale: Good               High level balance activites: Side stepping;Backward walking;Direction changes;Turns;Sudden stops;Head turns High Level Balance Comments: modest instability noted, provided cures for increased balance during higher level tasks            Cognition Arousal/Alertness: Awake/alert Behavior During Therapy: WFL for tasks assessed/performed Overall Cognitive Status: Within Functional Limits for tasks assessed                                        Exercises      General Comments General comments (skin integrity, edema, etc.): educated on safety with return to function at home. Discussed technique for improved balance and frequent mobilization. patient receptive      Pertinent Vitals/Pain Pain Assessment: Faces Faces Pain Scale: Hurts little more Pain Location: LE pain (reports fibromyalgia pain) Pain Descriptors / Indicators: Sore Pain Intervention(s): Monitored during session    Home Living                      Prior Function            PT Goals (current goals can now be found in the care plan section) Acute Rehab PT Goals Patient Stated Goal: return to PLOF PT Goal Formulation: With patient Time For Goal Achievement: 08/28/17 Potential to Achieve Goals: Good Progress towards PT goals: Progressing toward goals    Frequency    Min 3X/week      PT Plan Current plan remains appropriate    Co-evaluation  AM-PAC PT "6 Clicks" Daily Activity  Outcome Measure  Difficulty turning over in bed (including adjusting bedclothes, sheets and blankets)?: None Difficulty moving from lying on back to sitting on the side of the bed? : None Difficulty sitting down on and standing up from a chair with arms (e.g., wheelchair, bedside commode, etc,.)?: A Little Help needed moving to and from a bed to chair (including a wheelchair)?: A Little Help needed walking in hospital  room?: A Little Help needed climbing 3-5 steps with a railing? : A Little 6 Click Score: 20    End of Session Equipment Utilized During Treatment: Gait belt Activity Tolerance: Patient tolerated treatment well Patient left: in bed;with call bell/phone within reach;with family/visitor present (sitting EOB) Nurse Communication: Mobility status PT Visit Diagnosis: Unsteadiness on feet (R26.81);Other abnormalities of gait and mobility (R26.89)     Time: 4098-1191 PT Time Calculation (min) (ACUTE ONLY): 17 min  Charges:  $Gait Training: 8-22 mins                    G Codes:       Alben Deeds, PT DPT  Board Certified Neurologic Specialist [place] 08/16/2017, 5:00 PM

## 2017-08-16 NOTE — Discharge Summary (Signed)
Physician Discharge Summary  Grace Schmitt BPZ:[id number] DOB: [birth year] DOA: 08/13/2017  PCP: Marletta Lor, MD  Admit date: 08/13/2017 Discharge date: 08/16/2017  Admitted From: Home Disposition:  Home with Home Health PT/OT/SLP  Recommendations for Outpatient Follow-up:  1. Follow up with PCP in 1-2 weeks 2. Follow up with Endocrinology Dr. Buddy Duty as an outpatient 3. Follow up with Rheumatology Dr. Trudie Reed as an outpatient 4. Follow up with [facility] at next scheduled appointment 5. Please obtain CMP/CBC, Mag, Phos in one week 6. Please follow up on the following pending results: PTHrP and Vitamin D level   Home Health: YES  Equipment/Devices: None recommended by PT  Discharge Condition: Stable  CODE STATUS: FULL CODE Diet recommendation: Heart Healthy Diet  Brief/Interim Summary: 71 y.o.femalewith a past medical history significant for fibromyalgia and restless leg syndromewho presented with few weeks progressive forgetfulness, now acute word-finding difficulties. Patient provided her own history supplemented by family at bedside, although, because of the nature of her complaint, the history provided was confusing. Family thought the patientwas in her normal state (lives with husband, still drives, no memory concerns, independent)until a few weeks ago when she started to "talk out of her head" occasionally, and seem confused. Husband actually maybe took her keys away (she's never had problems driving before). There was some concern that this was related to her methadone, although she is on a stable dose, family know of no other medicine changes recently. Patient denies medicine or dose changes recently. Then, in the last 2 days, she has had terrible pains throughout the whole of her both legs. This is similar in character to hertypical chronic leg pain (she calls it fibromyalgia and restless legs). Simultaneously, she was noticing that she had gradually waxing and  waning confusion and word-finding difficulties throughout the day. There was no unilateral weakness, sensation loss, slurred speech, vision changes, syncope, seizures. +dry mouth, +cold intolerance maybe. Negative for fever, cough, sputum, dysuria, hematuria, frequency, urgency. Was worked up for concern for CVA with no imaging evidence of Stroke but found to be Hypercalcemic. Treated for Hypercalcemia with resolution of her symptoms. Has an appointment with [facility] for her progressive Forgetfulness. Was deemed medically stable to D/C home as symptoms improved and will need to follow up with PCP, Neurology, Rheumatology, and Endocrinology.   Discharge Diagnoses:  Principal Problem:   Encephalopathy Active Problems:   Hypothyroidism   Depression   Fibromyalgia   Hypercalcemia   Hypokalemia  1. Acute metabolic encephalopathy vs expressive aphasia, improved -Confusion probably related to hypercalcemia -MRI brain negative for CVA -Given IV Hydration -Lipid Panel showed Cholesterol of 192, HDL of 55, LDL of 117, TG of 100, and VLDL of 20 -Carotid u/s Showed 1-39% ICA plaquing. Vertebral artery flow is antegrade.  -ECHO Done showed The estimated ejection fraction was in the range of 60% to 65%. Wall motion was normal; there were no regional wall motion abnormalities. Doppler  parameters are consistent with abnormal left ventricular relaxation (grade 1 diastolic dysfunction). -PT recommending Home Health PT -SLP recommending Home Health SLP -Follow up with PCP as an outpatient   2. Hypercalcemia:  -Calcium Normalized and was 10.1 this AM -Follow up with Endocrinology Dr. Dagmar Hait as an outpatient  -Labs, PTH Intact done and was 11 and Calcium Total PTHJ was 11.4 indicating a Non-Parathyroid Calcemia suspect exogenous intake with Calcium Supplementation that patient had -PTHrp, vitamin D level pending -Consider spep, upep as an outpatient -If PTH rp high then need CT scan  chest/abd/pelvis -Calcium supplement stopped,  -Given Continue ns iv for now -Check cmp in am along with ionized calcium as an outpatient (Ionized Calcium still pending and initial lab was 6.2) -Attempted to make Endocrinology appointment but was unsuccessful   3. Hypothyroidism: -TSH was 4.114 -Continue Home levothyroxine  4. Restless leg syndrome, fibromyalgia: -Continue home methadone nightly as well as home Analgesics  -Follow up with Rheumatology as an outpatient; Attempted to make an appointment to Dr. Trudie Reed office but will need Referral by Primary   5. Hypokalemia resolved - Follow up with CMP as an outpatient   6. Normocytic Anemia - Repeat CBC as an outpatient   7. Glucose intolerance -Check hga1c in 72months as HbA1c was 5.7 -Outpatient PCP follow up  8. Memory Deficits -Going to see [facility] in AM   Discharge Instructions  Discharge Instructions    Call MD for:  difficulty breathing, headache or visual disturbances    Complete by:  As directed    Call MD for:  extreme fatigue    Complete by:  As directed    Call MD for:  hives    Complete by:  As directed    Call MD for:  persistant dizziness or light-headedness    Complete by:  As directed    Call MD for:  persistant nausea and vomiting    Complete by:  As directed    Call MD for:  redness, tenderness, or signs of infection (pain, swelling, redness, odor or green/yellow discharge around incision site)    Complete by:  As directed    Call MD for:  severe uncontrolled pain    Complete by:  As directed    Call MD for:  temperature >100.4    Complete by:  As directed    Diet - low sodium heart healthy    Complete by:  As directed    Discharge instructions    Complete by:  As directed    Follow up with PCP, Rhuematology, Endocrinology, and Neurology as an outpatient. Have PCP or Endocrinology follow up on Hypercalcemia studies and especially PTHrp; If high will need a Chest/Abdomen/Pelvis CT. Take  all medications as prescribed. If symptoms change or worsen please return to the ED for evaluation.   Increase activity slowly    Complete by:  As directed      Allergies as of 08/16/2017      Reactions   Phenergan [promethazine Hcl] Other (See Comments)   Pt has restless leg syndrome and the phenergan cause the RLS  to worsen       Medication List    STOP taking these medications   CALCIUM 600+D 600-400 MG-UNIT tablet Generic drug:  Calcium Carbonate-Vitamin D   CALCIUM PO     TAKE these medications   aspirin EC 81 MG tablet Take 81 mg by mouth at bedtime.   aspirin-acetaminophen-caffeine 250-250-65 MG tablet Commonly known as:  EXCEDRIN MIGRAINE Take 2 tablets by mouth daily as needed (pain).   Azelaic Acid 15 % cream After skin is thoroughly washed and patted dry, gently but thoroughly massage a thin film of azelaic acid cream into the affected area twice daily, in the morning and evening. What changed:  how to take this  when to take this  additional instructions   CoQ10 100 MG Caps Take 100 mg by mouth at bedtime.   DULoxetine 30 MG capsule Commonly known as:  CYMBALTA Take 60 mg by mouth daily.   Glucosamine-Chondroitin Caps Take 1 capsule  by mouth daily.   HYDROcodone-acetaminophen 5-325 MG tablet Commonly known as:  NORCO/VICODIN Take 1-2 tablets by mouth every 6 (six) hours as needed for moderate pain.   levothyroxine 50 MCG tablet Commonly known as:  SYNTHROID, LEVOTHROID TAKE 1 TABLET (50 MCG TOTAL) BY MOUTH DAILY. What changed:  See the new instructions.   loratadine 10 MG tablet Commonly known as:  CLARITIN Take 10 mg by mouth at bedtime.   methadone 5 MG tablet Commonly known as:  DOLOPHINE Take 7.5 mg by mouth daily at 6 PM. For restless legs   multivitamin with minerals Tabs tablet Take 1 tablet by mouth 2 (two) times daily.   NAUZENE [phone] MG Chew Generic drug:  Dextrose-Fructose-Sod Citrate Chew 2 tablets by mouth 2 (two)  times daily as needed (nausea/vomiting).   ondansetron 4 MG disintegrating tablet Commonly known as:  ZOFRAN ODT Take 1 tablet (4 mg total) by mouth every 8 (eight) hours as needed for nausea or vomiting.   OVER THE COUNTER MEDICATION Apply topically See admin instructions. Over the counter stop pain spray - apply topically at bedtime as needed for pain   senna-docusate 8.6-50 MG tablet Commonly known as:  Senokot-S Take 1 tablet by mouth at bedtime as needed for mild constipation or moderate constipation.   VITAMIN D PO Take 1 tablet by mouth daily.      Follow-up Information    Gavin Pound, MD. Schedule an appointment as soon as possible for a visit.   Specialty:  Rheumatology Why:  Attempted to make an appointment but per Office Manger they do not take referrals from the Hospital. Will need PCP to Refer patient.  Contact information: [address] [place] [address] [phone]        Marletta Lor, MD. Call.   Specialty:  Internal Medicine Why:  Call to schedule an appointment within 1 week.  Contact information: [place] [address] [phone]        Delrae Rend, MD. Call.   Specialty:  Endocrinology Why:  Follow up for Hypercalcemia Contact information: [address] [place] [address] [phone]          Allergies  Allergen Reactions  . Phenergan [Promethazine Hcl] Other (See Comments)    Pt has restless leg syndrome and the phenergan cause the RLS  to worsen     Consultations:  None  Procedures/Studies: Ct Head Wo Contrast  Result Date: 08/13/2017 CLINICAL DATA:  Altered level of consciousness.  Headache. EXAM: CT HEAD WITHOUT CONTRAST TECHNIQUE: Contiguous axial images were obtained from the base of the skull through the vertex without intravenous contrast. COMPARISON:  Head CT 10/20/2016 FINDINGS: Brain: Stable degree of atrophy and chronic small vessel  ischemia.No intracranial hemorrhage, mass effect, or midline shift. No hydrocephalus. The basilar cisterns are patent. No evidence of territorial infarct. No extra-axial or intracranial fluid collection. Vascular: Atherosclerosis of skullbase vasculature without hyperdense vessel or abnormal calcification. Skull: No fracture or focal lesion. Sinuses/Orbits: Improved left mastoid air cell opacification with mild residual in the lower portion. Right mastoid air cells are clear. Paranasal sinuses are well-aerated. Other: None. IMPRESSION: 1. No acute intracranial abnormality. Stable atrophy and chronic small vessel ischemia. 2. Improved left mastoid air cell opacification. Electronically Signed   By: Jeb Levering M.D.   On: 08/13/2017 20:06   Mr Brain Wo Contrast  Result Date: 08/14/2017 CLINICAL DATA:  Expressive aphasia EXAM: MRI HEAD WITHOUT CONTRAST TECHNIQUE: Multiplanar, multiecho  pulse sequences of the brain and surrounding structures were obtained without intravenous contrast. COMPARISON:  CT 08/13/2017 FINDINGS: Brain: Diffusion imaging does not show any acute or subacute infarction. There are chronic small-vessel ischemic changes throughout the pons. No focal cerebellar finding. Cerebral hemispheres show moderate chronic small-vessel ischemic changes throughout deep and subcortical white matter. Old lacunar infarctions basal ganglia. No large vessel territory infarction. No mass lesion, hemorrhage, hydrocephalus or extra-axial collection. Vascular: Major vessels at the base of the brain show flow. Skull and upper cervical spine: Negative Sinuses/Orbits: Paranasal sinuses are clear. Left mastoid effusion. Orbits negative. Other: None significant IMPRESSION: No acute brain finding. Chronic small-vessel ischemic changes affecting the pons in the cerebral hemispheres. Left mastoid effusion. Electronically Signed   By: [name] Chimes M.D.   On: 08/14/2017 14:13    CAROTID DOPPLERS Preliminary report:   1-39% ICA plaquing. Vertebral artery flow is antegrade.   ECHOCARDIOGRAM Study Conclusions  - Left ventricle: The cavity size was normal. Wall thickness was   normal. Systolic function was normal. The estimated ejection   fraction was in the range of 60% to 65%. Wall motion was normal;   there were no regional wall motion abnormalities. Doppler   parameters are consistent with abnormal left ventricular   relaxation (grade 1 diastolic dysfunction).  Subjective: Seen and examined and was alert and oriented and had no issues. Husband thinks shes 90% back to baseline. Patient feels well and ready to go home. No CP or SOB. No other concerns or complaints.   Discharge Exam: Vitals:   08/16/17 1034 08/16/17 1443  BP: (!) 152/76 136/66  Pulse: 70 71  Resp: 18 18  Temp:  98.2 F (36.8 C)  SpO2: 98% 96%   Vitals:   08/16/17 0053 08/16/17 0501 08/16/17 1034 08/16/17 1443  BP: 139/76 (!) 150/75 (!) 152/76 136/66  Pulse: 65 (!) 58 70 71  Resp: 18 18 18 18   Temp: 98.1 F (36.7 C) 98.2 F (36.8 C)  98.2 F (36.8 C)  TempSrc: Oral Oral Oral Oral  SpO2: 99% 97% 98% 96%  Weight:      Height:       General: Pt is alert, awake, not in acute distress Cardiovascular: RRR, S1/S2 +, no rubs, no gallops Respiratory: CTA bilaterally, no wheezing, no rhonchi Abdominal: Soft, NT, ND, bowel sounds + Extremities: no edema, no cyanosis  The results of significant diagnostics from this hospitalization (including imaging, microbiology, ancillary and laboratory) are listed below for reference.    Microbiology: No results found for this or any previous visit (from the past 240 hour(s)).   Labs: BNP (last 3 results) No results for input(s): BNP in the last 8760 hours. Basic Metabolic Panel:  Recent Labs Lab 08/13/17 1904 08/14/17 0056 08/15/17 0435 08/16/17 0544  NA 137  --  140 139  K 3.3*  --  3.7 3.7  CL 101  --  111 108  CO2 26  --  22 25  GLUCOSE 105*  --  97 112*  BUN 20  --  10 9   CREATININE 0.98  --  0.84 0.85  CALCIUM 11.5* 11.4* 9.9 10.1  MG  --  2.0  --   --    Liver Function Tests:  Recent Labs Lab 08/13/17 1904 08/14/17 0130 08/15/17 0435 08/16/17 0544  AST 27 24 19 20   ALT 21 19 16 16   ALKPHOS 64 59 50 54  BILITOT 0.6 0.7 0.7 1.0  PROT 7.7 7.4 6.8 7.6  ALBUMIN  4.2 3.7 3.2* 3.6   No results for input(s): LIPASE, AMYLASE in the last 168 hours. No results for input(s): AMMONIA in the last 168 hours. CBC:  Recent Labs Lab 08/13/17 1904 08/15/17 0435 08/16/17 0544  WBC 8.5 6.7 6.8  NEUTROABS 5.6  --   --   HGB 12.4 11.6* 12.3  HCT 37.4 35.7* 37.6  MCV 90.6 89.9 90.4  PLT 264 232 236   Cardiac Enzymes:  Recent Labs Lab 08/13/17 2016  TROPONINI <0.03   BNP: Invalid input(s): POCBNP CBG: No results for input(s): GLUCAP in the last 168 hours. D-Dimer No results for input(s): DDIMER in the last 72 hours. Hgb A1c  Recent Labs  08/14/17 0056  HGBA1C 5.7*   Lipid Profile  Recent Labs  08/14/17 0056  CHOL 192  HDL 55  LDLCALC 117*  TRIG 100  CHOLHDL 3.5   Thyroid function studies  Recent Labs  08/14/17 0056  TSH 4.114   Anemia work up No results for input(s): VITAMINB12, FOLATE, FERRITIN, TIBC, IRON, RETICCTPCT in the last 72 hours. Urinalysis    Component Value Date/Time   COLORURINE YELLOW 08/13/2017 1904   APPEARANCEUR CLOUDY (A) 08/13/2017 1904   LABSPEC 1.010 08/13/2017 1904   PHURINE 7.0 08/13/2017 1904   GLUCOSEU NEGATIVE 08/13/2017 1904   GLUCOSEU NEGATIVE 04/27/2011 0744   HGBUR NEGATIVE 08/13/2017 1904   HGBUR negative 05/18/2008 0758   BILIRUBINUR NEGATIVE 08/13/2017 1904   BILIRUBINUR n 06/29/2017 1034   [place] 08/13/2017 1904   PROTEINUR NEGATIVE 08/13/2017 1904   UROBILINOGEN 0.2 06/29/2017 1034   UROBILINOGEN 0.2 04/27/2011 0744   NITRITE NEGATIVE 08/13/2017 1904   LEUKOCYTESUR TRACE (A) 08/13/2017 1904   Sepsis Labs Invalid input(s): PROCALCITONIN,  WBC,   LACTICIDVEN Microbiology No results found for this or any previous visit (from the past 240 hour(s)).  Time coordinating discharge: 25 Minutes  SIGNED:  Kerney Elbe, DO [facility] 08/16/2017, 3:24 PM Pager [phone]  If 7PM-7AM, please contact night-coverage [email or web address] Password [email or web address]

## 2017-08-16 NOTE — Progress Notes (Signed)
Discharge orders received.  Discharge instructions and follow-up appointments reviewed with the patient.  VSS upon discharge.  IV removed and education complete.  All belongings sent with the patient.  Transported out via wheelchair. Maranda Marte M, RN   

## 2017-08-17 ENCOUNTER — Telehealth: Payer: Self-pay | Admitting: Internal Medicine

## 2017-08-17 DIAGNOSIS — Z8349 Family history of other endocrine, nutritional and metabolic diseases: Secondary | ICD-10-CM | POA: Diagnosis not present

## 2017-08-17 DIAGNOSIS — Z79891 Long term (current) use of opiate analgesic: Secondary | ICD-10-CM | POA: Diagnosis not present

## 2017-08-17 DIAGNOSIS — M069 Rheumatoid arthritis, unspecified: Secondary | ICD-10-CM

## 2017-08-17 DIAGNOSIS — G2581 Restless legs syndrome: Secondary | ICD-10-CM | POA: Diagnosis not present

## 2017-08-17 DIAGNOSIS — D649 Anemia, unspecified: Secondary | ICD-10-CM | POA: Diagnosis not present

## 2017-08-17 DIAGNOSIS — M8580 Other specified disorders of bone density and structure, unspecified site: Secondary | ICD-10-CM | POA: Diagnosis not present

## 2017-08-17 DIAGNOSIS — R7302 Impaired glucose tolerance (oral): Secondary | ICD-10-CM | POA: Diagnosis not present

## 2017-08-17 DIAGNOSIS — E039 Hypothyroidism, unspecified: Secondary | ICD-10-CM | POA: Diagnosis not present

## 2017-08-17 LAB — VITAMIN D 25 HYDROXY (VIT D DEFICIENCY, FRACTURES): Vit D, 25-Hydroxy: 34.4 ng/mL (ref 30.0–100.0)

## 2017-08-17 LAB — CALCIUM, IONIZED: Calcium, Ionized, Serum: 5.7 mg/dL — ABNORMAL HIGH (ref 4.5–5.6)

## 2017-08-17 NOTE — Telephone Encounter (Signed)
Please advise 

## 2017-08-17 NOTE — Telephone Encounter (Signed)
° ° ° °  Pt req Referral to see a rheumatologist   Dr Berna Bue

## 2017-08-17 NOTE — Telephone Encounter (Signed)
Grace Schmitt with [facility] would like verbal order for ST 1 wk/ 2  Also allison reports pt's bp this afternoon 134/89.  Pt concerned because her bp has been all over the place. Would like advice.

## 2017-08-17 NOTE — Telephone Encounter (Signed)
Okay for referral?

## 2017-08-17 NOTE — Telephone Encounter (Signed)
Okay for ST

## 2017-08-18 ENCOUNTER — Telehealth: Payer: Self-pay

## 2017-08-18 NOTE — Telephone Encounter (Signed)
LMTCB

## 2017-08-19 NOTE — Addendum Note (Signed)
Addended by: Abelardo Diesel on: 08/19/2017 08:20 AM   Modules accepted: Orders

## 2017-08-19 NOTE — Telephone Encounter (Signed)
Referral order placed in epic

## 2017-08-19 NOTE — Telephone Encounter (Signed)
Verbal orders given to [facility] per Dr Raliegh Ip.

## 2017-08-20 ENCOUNTER — Other Ambulatory Visit: Payer: Self-pay | Admitting: Internal Medicine

## 2017-08-20 ENCOUNTER — Telehealth: Payer: Self-pay | Admitting: Internal Medicine

## 2017-08-20 DIAGNOSIS — R9389 Abnormal findings on diagnostic imaging of other specified body structures: Secondary | ICD-10-CM

## 2017-08-20 DIAGNOSIS — G2581 Restless legs syndrome: Secondary | ICD-10-CM | POA: Diagnosis not present

## 2017-08-20 DIAGNOSIS — Z79891 Long term (current) use of opiate analgesic: Secondary | ICD-10-CM | POA: Diagnosis not present

## 2017-08-20 DIAGNOSIS — R7302 Impaired glucose tolerance (oral): Secondary | ICD-10-CM | POA: Diagnosis not present

## 2017-08-20 DIAGNOSIS — E039 Hypothyroidism, unspecified: Secondary | ICD-10-CM | POA: Diagnosis not present

## 2017-08-20 DIAGNOSIS — D649 Anemia, unspecified: Secondary | ICD-10-CM | POA: Diagnosis not present

## 2017-08-20 LAB — PTH-RELATED PEPTIDE: PTH-related peptide: <2 pmol/L

## 2017-08-20 NOTE — Telephone Encounter (Signed)
Transition Care Management Follow-up Telephone Call  Per Discharge Summary: Admit date: 08/13/2017 Discharge date: 08/16/2017  Admitted From: Home Disposition:  Home with [facility] PT/OT/SLP  Recommendations for Outpatient Follow-up:  1. Follow up with PCP in 1-2 weeks 2. Follow up with Endocrinology Dr. Buddy Duty as an outpatient 3. Follow up with Rheumatology Dr. Trudie Reed as an outpatient 4. Follow up with [facility] at next scheduled appointment 5. Please obtain CMP/CBC, Mag, Phos in one week 6. Please follow up on the following pending results: PTHrP and Vitamin D level   Home Health: YES  Equipment/Devices: None recommended by PT  Discharge Condition: Stable  CODE STATUS: FULL CODE Diet recommendation: Heart Healthy Diet  --   How have you been since you were released from the hospital? "I've been doing better."   Do you understand why you were in the hospital? yes   Do you understand the discharge instructions? yes   Where were you discharged to? Home w/ home health   Items Reviewed:  Medications reviewed: yes  Allergies reviewed: yes  Dietary changes reviewed: yes  Referrals reviewed: yes   Functional Questionnaire:   Activities of Daily Living (ADLs):  She states they are independent in the following: ambulation, bathing and hygiene, feeding, continence, grooming, toileting and dressing States they require assistance with the following: None, but states she does have to be cautious with her balance and is considering getting a cane. She does have grab bars on bathroom and shower walls.   Any transportation issues/concerns?: no   Any patient concerns? yes, she was told to stop calcium and vitamin D supplements in the hospital and wants to make sure this is okay. She need referral to rheumatologist for RLS and firbromyalgia.    Confirmed importance and date/time of follow-up visits scheduled yes  Provider Appointment booked with Dr. Burnice Logan  08/25/2017 @ 11:15am.  Confirmed with patient if condition begins to worsen call PCP or go to the ER.  Patient was given the office number and encouraged to call back with question or concerns.  : yes

## 2017-08-20 NOTE — Telephone Encounter (Signed)
Grace Schmitt is requesting a verbal order for the pt to have one more PT session

## 2017-08-20 NOTE — Telephone Encounter (Signed)
Spoke with mary form [facility] and gave verbal orders for PT per Dr Raliegh Ip.

## 2017-08-21 ENCOUNTER — Encounter (HOSPITAL_COMMUNITY): Payer: Self-pay | Admitting: *Deleted

## 2017-08-21 DIAGNOSIS — I10 Essential (primary) hypertension: Secondary | ICD-10-CM | POA: Insufficient documentation

## 2017-08-21 DIAGNOSIS — Z79899 Other long term (current) drug therapy: Secondary | ICD-10-CM | POA: Diagnosis not present

## 2017-08-21 DIAGNOSIS — Z7982 Long term (current) use of aspirin: Secondary | ICD-10-CM | POA: Insufficient documentation

## 2017-08-21 DIAGNOSIS — R51 Headache: Secondary | ICD-10-CM | POA: Insufficient documentation

## 2017-08-21 DIAGNOSIS — E039 Hypothyroidism, unspecified: Secondary | ICD-10-CM | POA: Insufficient documentation

## 2017-08-21 LAB — CBC
HCT: 37.5 % (ref 36.0–46.0)
Hemoglobin: 12.2 g/dL (ref 12.0–15.0)
MCH: 29.5 pg (ref 26.0–34.0)
MCHC: 32.5 g/dL (ref 30.0–36.0)
MCV: 90.8 fL (ref 78.0–100.0)
Platelets: 301 10*3/uL (ref 150–400)
RBC: 4.13 MIL/uL (ref 3.87–5.11)
RDW: 14 % (ref 11.5–15.5)
WBC: 7.2 10*3/uL (ref 4.0–10.5)

## 2017-08-21 LAB — BASIC METABOLIC PANEL
Anion gap: 11 (ref 5–15)
BUN: 12 mg/dL (ref 6–20)
CO2: 25 mmol/L (ref 22–32)
Calcium: 10.6 mg/dL — ABNORMAL HIGH (ref 8.9–10.3)
Chloride: 101 mmol/L (ref 101–111)
Creatinine, Ser: 0.83 mg/dL (ref 0.44–1.00)
GFR calc Af Amer: >60 mL/min (ref >60)
GFR calc non Af Amer: >60 mL/min (ref >60)
Glucose, Bld: 98 mg/dL (ref 65–99)
Potassium: 4 mmol/L (ref 3.5–5.1)
Sodium: 137 mmol/L (ref 135–145)

## 2017-08-21 LAB — URINALYSIS, ROUTINE W REFLEX MICROSCOPIC
Bilirubin Urine: NEGATIVE
Glucose, UA: NEGATIVE mg/dL
Hgb urine dipstick: NEGATIVE
Ketones, ur: NEGATIVE mg/dL
Leukocytes, UA: NEGATIVE
Nitrite: NEGATIVE
Protein, ur: NEGATIVE mg/dL
Specific Gravity, Urine: 1.004 — ABNORMAL LOW (ref 1.005–1.030)
pH: 7 (ref 5.0–8.0)

## 2017-08-21 NOTE — ED Triage Notes (Signed)
Pt states that she was having a HA and so she checked her BP and found that she was hypertensive.  She called [facility] (they are providing PT and speech therapy to pt) and they advised her to come to the ED.  Pt reports HA is 8/10.  Pt is alert and oriented, no focal weakness or deficit.  Pt denies any CP or sob with this.

## 2017-08-22 ENCOUNTER — Emergency Department (HOSPITAL_COMMUNITY): Payer: Medicare Other

## 2017-08-22 ENCOUNTER — Emergency Department (HOSPITAL_COMMUNITY)
Admission: EM | Admit: 2017-08-22 | Discharge: 2017-08-22 | Disposition: A | Discharge status: Home / Self Care | Payer: Medicare Other | Attending: Emergency Medicine | Admitting: Emergency Medicine

## 2017-08-22 DIAGNOSIS — R51 Headache: Secondary | ICD-10-CM | POA: Diagnosis not present

## 2017-08-22 DIAGNOSIS — I10 Essential (primary) hypertension: Secondary | ICD-10-CM

## 2017-08-22 DIAGNOSIS — R519 Headache, unspecified: Secondary | ICD-10-CM

## 2017-08-22 MED ORDER — ONDANSETRON HCL 4 MG/2ML IJ SOLN
4.0000 mg | Freq: Once | INTRAMUSCULAR | Status: AC
  Administered 2017-08-22: 4 mg via INTRAVENOUS
  Filled 2017-08-22: qty 2

## 2017-08-22 MED ORDER — ONDANSETRON HCL 4 MG PO TABS: 8.0000 mg | ORAL_TABLET | Freq: Three times a day (TID) | ORAL | 0 refills | Status: DC | PRN

## 2017-08-22 MED ORDER — PROCHLORPERAZINE EDISYLATE 5 MG/ML IJ SOLN
10.0000 mg | Freq: Once | INTRAMUSCULAR | Status: AC
  Administered 2017-08-22: 10 mg via INTRAMUSCULAR
  Filled 2017-08-22: qty 2

## 2017-08-22 MED ORDER — OXYCODONE-ACETAMINOPHEN 5-325 MG PO TABS
1.0000 | ORAL_TABLET | Freq: Once | ORAL | Status: AC
  Administered 2017-08-22: 1 via ORAL
  Filled 2017-08-22: qty 1

## 2017-08-22 NOTE — ED Provider Notes (Signed)
[facility] DEPT Provider Note   CSN: [id number] Arrival date & time: 08/21/17  2208     History   Chief Complaint Chief Complaint  Patient presents with  . Hypertension    HPI Grace Schmitt is a 71 y.o. female.  HPI Patient presents emergency department with complaints of ongoing headache over the past 24 hours without fevers or chills or neck pain.  No recent injury or trauma.  Denies weakness in the arms and legs.  She was at home so she checked her blood pressure and found herself to be hypertensive in the 170/90 range.  She was concerned that her blood pressure was causing the symptoms and thus she came to the ER for evaluation.  Family reports no altered mental status.  She was recently hospitalized for hypercalcemia and strokelike symptoms but her head CT and MRI were negative per the patient.  Some nausea now.  No vomiting.    Past Medical History:  Diagnosis Date  . Allergy    prn claritin  . Fibromyalgia   . Hx of colonic polyps serrated and adenomatous 05/28/2005  . Hyperlipidemia   . Ischemic chest pain   . PONV (postoperative nausea and vomiting)   . Restless leg syndrome   . Thyroid disease     Patient Active Problem List   Diagnosis Date Noted  . Hypokalemia 08/15/2017  . Hypercalcemia 08/14/2017  . Encephalopathy 08/13/2017  . Osteopenia 06/05/2016  . Restless leg syndrome 03/31/2016  . Fibromyalgia 03/31/2016  . Hyperlipidemia 01/17/2016  . Exertional chest pain   . Chest pain at rest 01/15/2016  . Depression 01/15/2016  . Hypothyroidism 06/04/2014  . Hx of colonic polyps serrated and adenomatous 05/28/2005    Past Surgical History:  Procedure Laterality Date  . COLONOSCOPY  October 2011  . TONSILLECTOMY  1951  . TUBAL LIGATION  1978   x2    OB History    No data available       Home Medications    Prior to Admission medications   Medication Sig Start Date End Date Taking? Authorizing Provider  aspirin EC 81 MG tablet Take 81  mg by mouth at bedtime.   Yes [provider]  aspirin-acetaminophen-caffeine (EXCEDRIN MIGRAINE) [phone] MG tablet Take 2 tablets by mouth daily as needed (pain).    Yes [provider]  Azelaic Acid 15 % cream After skin is thoroughly washed and patted dry, gently but thoroughly massage a thin film of azelaic acid cream into the affected area twice daily, in the morning and evening. Patient taking differently: Apply topically See admin instructions. After skin is thoroughly washed and patted dry, gently but thoroughly massage a thin film of azelaic acid cream into the affected area twice daily, in the morning and evening as needed for rosacea 04/30/15  Yes Marletta Lor, MD  Cholecalciferol (VITAMIN D PO) Take 1 tablet by mouth daily.   Yes [provider]  Coenzyme Q10 (COQ10) 100 MG CAPS Take 100 mg by mouth at bedtime.   Yes [provider]  Dextrose-Fructose-Sod Citrate Almon Hercules) [phone] MG CHEW Chew 2 tablets by mouth 2 (two) times daily as needed (nausea/vomiting).   Yes [provider]  DULoxetine (CYMBALTA) 30 MG capsule Take 60 mg by mouth daily.  10/24/16 10/24/17 Yes [provider]  Glucosamine-Chondroit-Vit C-Mn (GLUCOSAMINE-CHONDROITIN) CAPS Take 1 capsule by mouth daily.     Yes [provider]  levothyroxine (SYNTHROID, LEVOTHROID) 50 MCG tablet TAKE 1 TABLET (50 MCG TOTAL) BY  MOUTH DAILY. Patient taking differently: TAKE 1 TABLET (50 MCG TOTAL) BY MOUTH DAILY BEFORE BREAKFAST 02/01/17  Yes Marletta Lor, MD  loratadine (CLARITIN) 10 MG tablet Take 10 mg by mouth at bedtime.    Yes [provider]  methadone (DOLOPHINE) 5 MG tablet Take 7.5 mg by mouth daily at 6 PM. For restless legs   Yes [provider]  Multiple Vitamin (MULTIVITAMIN WITH MINERALS) TABS tablet Take 1 tablet by mouth 2 (two) times daily.   Yes [provider]  ondansetron (ZOFRAN ODT) 4 MG disintegrating  tablet Take 1 tablet (4 mg total) by mouth every 8 (eight) hours as needed for nausea or vomiting. 06/29/17  Yes Marletta Lor, MD  OVER THE COUNTER MEDICATION Apply topically See admin instructions. Over the counter stop pain spray - apply topically at bedtime as needed for pain   Yes [provider]  Wheat Dextrin (BENEFIBER PO) Take 1 Dose by mouth daily.   Yes [provider]  ondansetron (ZOFRAN) 4 MG tablet Take 2 tablets (8 mg total) by mouth every 8 (eight) hours as needed for nausea or vomiting. 08/22/17   Jola Schmidt, MD    Family History Family History  Problem Relation Age of Onset  . Dementia Mother   . Heart disease Mother        smal vessel disease  . Hypertension Brother   . Colon cancer Neg Hx   . Colon polyps Neg Hx   . Breast cancer Neg Hx     Social History Social History  Substance Use Topics  . Smoking status: Never Smoker  . Smokeless tobacco: Never Used  . Alcohol use No     Allergies   Phenergan [promethazine hcl]   Review of Systems Review of Systems  All other systems reviewed and are negative.    Physical Exam Updated Vital Signs BP (!) 143/74   Pulse 67   Temp 98 F (36.7 C) (Oral)   Resp 18   SpO2 100%   Physical Exam  Constitutional: She is oriented to person, place, and time. She appears well-developed and well-nourished. No distress.  HENT:  Head: Normocephalic and atraumatic.  Eyes: Pupils are equal, round, and reactive to light. EOM are normal.  Neck: Normal range of motion.  Cardiovascular: Normal rate, regular rhythm and normal heart sounds.   Pulmonary/Chest: Effort normal and breath sounds normal.  Abdominal: Soft. She exhibits no distension. There is no tenderness.  Musculoskeletal: Normal range of motion.  Neurological: She is alert and oriented to person, place, and time.  5/5 strength in major muscle groups of  bilateral upper and lower extremities. Speech normal. No facial asymetry.   Skin:  Skin is warm and dry.  Psychiatric: She has a normal mood and affect. Judgment normal.  Nursing note and vitals reviewed.    ED Treatments / Results  Labs (all labs ordered are listed, but only abnormal results are displayed) Labs Reviewed  URINALYSIS, ROUTINE W REFLEX MICROSCOPIC - Abnormal; Notable for the following:       Result Value   Color, Urine STRAW (*)    Specific Gravity, Urine 1.004 (*)    All other components within normal limits  BASIC METABOLIC PANEL - Abnormal; Notable for the following:    Calcium 10.6 (*)    All other components within normal limits  CBC    EKG  EKG Interpretation None       Radiology Ct Head Wo Contrast  Result Date:  08/22/2017 CLINICAL DATA:  Headache EXAM: CT HEAD WITHOUT CONTRAST TECHNIQUE: Contiguous axial images were obtained from the base of the skull through the vertex without intravenous contrast. COMPARISON:  Brain MRI 08/14/2017 FINDINGS: Brain: No mass lesion, intraparenchymal hemorrhage or extra-axial collection. No evidence of acute cortical infarct. Advanced atrophy. There is periventricular hypoattenuation compatible with chronic microvascular disease. Old bilateral basal ganglia lacunar infarcts. Vascular: No hyperdense vessel or unexpected calcification. Skull: Normal visualized skull base, calvarium and extracranial soft tissues. Sinuses/Orbits: No sinus fluid levels or advanced mucosal thickening. Small left mastoid effusion. Normal orbits. IMPRESSION: 1. No acute intracranial abnormality. 2. Chronic hypertensive microangiopathy and advanced atrophy. Electronically Signed   By: Ulyses Jarred M.D.   On: 08/22/2017 03:43    Procedures Procedures (including critical care time)  Medications Ordered in ED Medications  oxyCODONE-acetaminophen (PERCOCET/ROXICET) 5-325 MG per tablet 1 tablet (1 tablet Oral Given 08/22/17 0409)  prochlorperazine (COMPAZINE) injection 10 mg (10 mg Intramuscular Given 08/22/17 0414)  ondansetron  (ZOFRAN) injection 4 mg (4 mg Intravenous Given 08/22/17 0410)     Initial Impression / Assessment and Plan / ED Course  I have reviewed the triage vital signs and the nursing notes.  Pertinent labs & imaging results that were available during my care of the patient were reviewed by me and considered in my medical decision making (see chart for details).     Feels much better after management of her headache at this time.  Head CT normal.  Blood pressure normalized without treatment.  Discharge home in good condition.  Primary care follow-up.  Patient understands to return to the ER for new or worsening symptoms  Final Clinical Impressions(s) / ED Diagnoses   Final diagnoses:  Acute nonintractable headache, unspecified headache type  Hypertension, unspecified type    New Prescriptions New Prescriptions   ONDANSETRON (ZOFRAN) 4 MG TABLET    Take 2 tablets (8 mg total) by mouth every 8 (eight) hours as needed for nausea or vomiting.     Jola Schmidt, MD 08/22/17 [phone]

## 2017-08-22 NOTE — ED Notes (Signed)
Pt. Ambulated well to bathroom.

## 2017-08-23 ENCOUNTER — Other Ambulatory Visit: Payer: Self-pay | Admitting: *Deleted

## 2017-08-23 DIAGNOSIS — Z79891 Long term (current) use of opiate analgesic: Secondary | ICD-10-CM | POA: Diagnosis not present

## 2017-08-23 DIAGNOSIS — D649 Anemia, unspecified: Secondary | ICD-10-CM | POA: Diagnosis not present

## 2017-08-23 DIAGNOSIS — R7302 Impaired glucose tolerance (oral): Secondary | ICD-10-CM | POA: Diagnosis not present

## 2017-08-23 DIAGNOSIS — E039 Hypothyroidism, unspecified: Secondary | ICD-10-CM | POA: Diagnosis not present

## 2017-08-23 DIAGNOSIS — G2581 Restless legs syndrome: Secondary | ICD-10-CM | POA: Diagnosis not present

## 2017-08-23 NOTE — Patient Outreach (Signed)
[facility]) Care Management  08/23/2017  Grace Schmitt 1946/05/06 [id number]   EMMI- General Discharge RED ON EMMI ALERT DAY#: 4  DATE: 08/21/17 RED ALERT: Lost interest in things? Yes  Outreach attempt #1 to patient. No answer. RN CM left HIPAA compliant message along with contact info.    Plan: RN CM will contact patient within 1 business day.  Lake Bells, RN, BSN, MHA/MSL, [place] Telephonic Care Manager Coordinator [facility] Direct Phone: [phone] Toll Free: [phone] Fax: [phone]

## 2017-08-24 ENCOUNTER — Ambulatory Visit: Payer: Self-pay | Admitting: *Deleted

## 2017-08-24 ENCOUNTER — Other Ambulatory Visit: Payer: Self-pay | Admitting: *Deleted

## 2017-08-24 DIAGNOSIS — R7302 Impaired glucose tolerance (oral): Secondary | ICD-10-CM | POA: Diagnosis not present

## 2017-08-24 DIAGNOSIS — Z79891 Long term (current) use of opiate analgesic: Secondary | ICD-10-CM | POA: Diagnosis not present

## 2017-08-24 DIAGNOSIS — G2581 Restless legs syndrome: Secondary | ICD-10-CM | POA: Diagnosis not present

## 2017-08-24 DIAGNOSIS — E039 Hypothyroidism, unspecified: Secondary | ICD-10-CM | POA: Diagnosis not present

## 2017-08-24 DIAGNOSIS — D649 Anemia, unspecified: Secondary | ICD-10-CM | POA: Diagnosis not present

## 2017-08-24 NOTE — Patient Outreach (Signed)
[facility]) Care Management  08/24/2017  Jalexus Brett [birth year] [id number]   EMMI- General Discharge RED ON EMMI ALERT DAY#: 4  DATE: 08/21/17 RED ALERT: Lost interest in things? Yes  Patient stated, "She is feeling better now". She confirmed "feeling tired and having no energy, which prevented her from doing things". She reported, she was unable to concentrate. Patient was about to take a walk prior to my phone call.   Patient had follow-up discharge hospital appointment with Neurologist. Patient stated, the MD was unable to give her a definite answer about her problems, however tests are being completed. Patient was told her symptoms could be related to her hypercalcemia. Her next Neurologist appointment is scheduled in September. Her follow-up hospital discharge PCP appointment is 08/25/17.   Patient went to the ED on 08/22/17 for a severe H/A and nausea. She stated, all of the test were negative. She was given a shot for the H/A and medication for nausea. Patient verbalized not having a history of allergies or sinus complications.   Patient is active with [facility] (SLP/OT/PT). She stated, SLP will be signing off today.   Plan: RN CM will notify [facility] CM administrative assistant regarding case closure.

## 2017-08-25 ENCOUNTER — Encounter: Payer: Self-pay | Admitting: Internal Medicine

## 2017-08-25 ENCOUNTER — Ambulatory Visit (INDEPENDENT_AMBULATORY_CARE_PROVIDER_SITE_OTHER): Payer: Medicare Other | Admitting: Internal Medicine

## 2017-08-25 DIAGNOSIS — M8580 Other specified disorders of bone density and structure, unspecified site: Secondary | ICD-10-CM | POA: Diagnosis not present

## 2017-08-25 DIAGNOSIS — G2581 Restless legs syndrome: Secondary | ICD-10-CM

## 2017-08-25 DIAGNOSIS — G9340 Encephalopathy, unspecified: Secondary | ICD-10-CM | POA: Diagnosis not present

## 2017-08-25 DIAGNOSIS — Z23 Encounter for immunization: Secondary | ICD-10-CM | POA: Diagnosis not present

## 2017-08-25 NOTE — Patient Instructions (Signed)
Follow Dr. Buddy Duty as scheduled  Chest CT scan tomorrow  as scheduled  Drink as much fluid as you  can tolerate.  Return in 3 months for follow-up

## 2017-08-25 NOTE — Progress Notes (Signed)
Subjective:    Patient ID: Grace Schmitt, female    DOB: 02/21/1946, 71 y.o.   MRN: [id number]  HPI  Admit date: 08/13/2017 Discharge date: 08/16/2017  Admitted From: Home Disposition:  Home with [facility] PT/OT/SLP  Recommendations for Outpatient Follow-up:  1. Follow up with PCP in 1-2 weeks 2. Follow up with Endocrinology Dr. Buddy Duty as an outpatient 3. Follow up with Rheumatology Dr. Trudie Reed as an outpatient 4. Follow up with [facility] at next scheduled appointment 5. Please obtain CMP/CBC, Mag, Phos in one week 6. Please follow up on the following pending results: PTHrP and Vitamin D level   Discharge Diagnoses:  Principal Problem:   Encephalopathy Active Problems:   Hypothyroidism   Depression   Fibromyalgia   Hypercalcemia   Hypokalemia  1. Acute metabolic encephalopathy vs expressive aphasia, improved -Confusion probably related to hypercalcemia -MRI brain negative for CVA -Given IV Hydration -Lipid Panel showed Cholesterol of 192, HDL of 55, LDL of 117, TG of 100, and VLDL of 20 -Carotid u/s Showed 1-39% ICA plaquing. Vertebral artery flow is antegrade.  -ECHO Done showed The estimated ejection fraction was in the range of 60% to 65%. Wall motion was normal; there were no regional wall motion abnormalities. Doppler parameters are consistent with abnormal left ventricular relaxation (grade 1 diastolic dysfunction). -PT recommending Home Health PT -SLP recommending Home Health SLP -Follow up with PCP as an outpatient   08/24/2017.  71 year old patient who is seen today following a recent hospital discharge and for  transitional care management. Hospital records reviewed Since her discharge she has done quite well.  She feels her mental status is now near normal.  She was seen by endocrinology recently and is scheduled for a chest CT tomorrow to further evaluate hypercalcemia.  PTH was checked in the hospital and was slightly suppressed.  The patient has  been on standard calcium and vitamin D supplementation but nothing excessive.  She has done this for years She had questions concerning 2-D echocardiogram.  She plans to follow-up with [facility] neurology She did have a chest x-ray late last year that revealed stable chronic interstitial changes. Denies any pulmonary complaints.  2-D echocardiogram revealed normal right-sided pressures  Past Medical History:  Diagnosis Date  . Allergy    prn claritin  . Fibromyalgia   . Hx of colonic polyps serrated and adenomatous 05/28/2005  . Hyperlipidemia   . Ischemic chest pain   . PONV (postoperative nausea and vomiting)   . Restless leg syndrome   . Thyroid disease      Social History   Social History  . Marital status: Married    Spouse name: N/A  . Number of children: N/A  . Years of education: N/A   Occupational History  . Not on file.   Social History Main Topics  . Smoking status: Never Smoker  . Smokeless tobacco: Never Used  . Alcohol use No  . Drug use: No  . Sexual activity: Not on file   Other Topics Concern  . Not on file   Social History Narrative  . No narrative on file    Past Surgical History:  Procedure Laterality Date  . COLONOSCOPY  October 2011  . TONSILLECTOMY  1951  . TUBAL LIGATION  1978   x2    Family History  Problem Relation Age of Onset  . Dementia Mother   . Heart disease Mother        smal vessel disease  . Hypertension Brother   .  Colon cancer Neg Hx   . Colon polyps Neg Hx   . Breast cancer Neg Hx     Allergies  Allergen Reactions  . Phenergan [Promethazine Hcl] Other (See Comments)    Pt has restless leg syndrome and the phenergan cause the RLS  to worsen     Current Outpatient Prescriptions on File Prior to Visit  Medication Sig Dispense Refill  . aspirin EC 81 MG tablet Take 81 mg by mouth at bedtime.    Marland Kitchen aspirin-acetaminophen-caffeine (EXCEDRIN MIGRAINE) 250-250-65 MG tablet Take 2 tablets by mouth daily as needed (pain).     .  Azelaic Acid 15 % cream After skin is thoroughly washed and patted dry, gently but thoroughly massage a thin film of azelaic acid cream into the affected area twice daily, in the morning and evening. (Patient taking differently: Apply topically See admin instructions. After skin is thoroughly washed and patted dry, gently but thoroughly massage a thin film of azelaic acid cream into the affected area twice daily, in the morning and evening as needed for rosacea) 50 g 4  . Cholecalciferol (VITAMIN D PO) Take 1 tablet by mouth daily.    . Coenzyme Q10 (COQ10) 100 MG CAPS Take 100 mg by mouth at bedtime.    Marland Kitchen Dextrose-Fructose-Sod Citrate (NAUZENE) [phone] MG CHEW Chew 2 tablets by mouth 2 (two) times daily as needed (nausea/vomiting).    . DULoxetine (CYMBALTA) 30 MG capsule Take 60 mg by mouth daily.     . Glucosamine-Chondroit-Vit C-Mn (GLUCOSAMINE-CHONDROITIN) CAPS Take 1 capsule by mouth daily.      Marland Kitchen levothyroxine (SYNTHROID, LEVOTHROID) 50 MCG tablet TAKE 1 TABLET (50 MCG TOTAL) BY MOUTH DAILY. (Patient taking differently: TAKE 1 TABLET (50 MCG TOTAL) BY MOUTH DAILY BEFORE BREAKFAST) 90 tablet 2  . loratadine (CLARITIN) 10 MG tablet Take 10 mg by mouth at bedtime.     . methadone (DOLOPHINE) 5 MG tablet Take 7.5 mg by mouth daily at 6 PM. For restless legs    . Multiple Vitamin (MULTIVITAMIN WITH MINERALS) TABS tablet Take 1 tablet by mouth 2 (two) times daily.    . ondansetron (ZOFRAN ODT) 4 MG disintegrating tablet Take 1 tablet (4 mg total) by mouth every 8 (eight) hours as needed for nausea or vomiting. 24 tablet 1  . ondansetron (ZOFRAN) 4 MG tablet Take 2 tablets (8 mg total) by mouth every 8 (eight) hours as needed for nausea or vomiting. 12 tablet 0  . OVER THE COUNTER MEDICATION Apply topically See admin instructions. Over the counter stop pain spray - apply topically at bedtime as needed for pain    . Wheat Dextrin (BENEFIBER PO) Take 1 Dose by mouth daily.     No current  facility-administered medications on file prior to visit.     BP 122/82 (BP Location: Left Arm, Patient Position: Sitting, Cuff Size: Normal)   Pulse 76   Temp 98 F (36.7 C) (Oral)   Wt 166 lb 3.2 oz (75.4 kg)   SpO2 96%   BMI 29.44 kg/m       Review of Systems  Constitutional: Negative.   HENT: Negative for congestion, dental problem, hearing loss, rhinorrhea, sinus pressure, sore throat and tinnitus.   Eyes: Negative for pain, discharge and visual disturbance.  Respiratory: Negative for cough and shortness of breath.   Cardiovascular: Negative for chest pain, palpitations and leg swelling.  Gastrointestinal: Negative for abdominal distention, abdominal pain, blood in stool, constipation, diarrhea, nausea and vomiting.  Genitourinary: Negative  for difficulty urinating, dysuria, flank pain, frequency, hematuria, pelvic pain, urgency, vaginal bleeding, vaginal discharge and vaginal pain.  Musculoskeletal: Negative for arthralgias, gait problem and joint swelling.  Skin: Negative for rash.  Neurological: Negative for dizziness, syncope, speech difficulty, weakness, numbness and headaches.  Hematological: Negative for adenopathy.  Psychiatric/Behavioral: Positive for behavioral problems, confusion and decreased concentration. Negative for agitation and dysphoric mood. The patient is not nervous/anxious.        Objective:   Physical Exam  Constitutional: She is oriented to person, place, and time. She appears well-developed and well-nourished. No distress.  Blood pressure 130/80  HENT:  Head: Normocephalic.  Right Ear: External ear normal.  Left Ear: External ear normal.  Mouth/Throat: Oropharynx is clear and moist.  Eyes: Pupils are equal, round, and reactive to light. Conjunctivae and EOM are normal.  Neck: Normal range of motion. Neck supple. No thyromegaly present.  Cardiovascular: Normal rate, regular rhythm, normal heart sounds and intact distal pulses.     Pulmonary/Chest: Effort normal and breath sounds normal.  Abdominal: Soft. Bowel sounds are normal. She exhibits no mass. There is no tenderness.  Musculoskeletal: Normal range of motion.  Lymphadenopathy:    She has no cervical adenopathy.  Neurological: She is alert and oriented to person, place, and time.  Skin: Skin is warm and dry. No rash noted.  Psychiatric: She has a normal mood and affect. Her behavior is normal. Judgment and thought content normal.          Assessment & Plan:  Non-parathyroid hypercalcemia.  Chest CT as scheduled.  The patient will continue liberal fluid intake and avoid calcium and vitamin D supplements Hypothyroidism History of osteopenia Restless leg syndrome.  Follow-up [facility] neurology  Return here in 3 months or as needed Endocrinology follow-up as scheduled  Nyoka Cowden

## 2017-08-26 ENCOUNTER — Ambulatory Visit
Admission: RE | Admit: 2017-08-26 | Discharge: 2017-08-26 | Disposition: A | Discharge status: Home / Self Care | Payer: Medicare Other | Source: Ambulatory Visit | Attending: Internal Medicine | Admitting: Internal Medicine

## 2017-08-26 DIAGNOSIS — D649 Anemia, unspecified: Secondary | ICD-10-CM | POA: Diagnosis not present

## 2017-08-26 DIAGNOSIS — Z79891 Long term (current) use of opiate analgesic: Secondary | ICD-10-CM | POA: Diagnosis not present

## 2017-08-26 DIAGNOSIS — R7302 Impaired glucose tolerance (oral): Secondary | ICD-10-CM | POA: Diagnosis not present

## 2017-08-26 DIAGNOSIS — G2581 Restless legs syndrome: Secondary | ICD-10-CM | POA: Diagnosis not present

## 2017-08-26 DIAGNOSIS — R9389 Abnormal findings on diagnostic imaging of other specified body structures: Secondary | ICD-10-CM

## 2017-08-26 DIAGNOSIS — R918 Other nonspecific abnormal finding of lung field: Secondary | ICD-10-CM | POA: Diagnosis not present

## 2017-08-26 DIAGNOSIS — E039 Hypothyroidism, unspecified: Secondary | ICD-10-CM | POA: Diagnosis not present

## 2017-08-26 MED ORDER — IOPAMIDOL (ISOVUE-300) INJECTION 61%
75.0000 mL | Freq: Once | INTRAVENOUS | Status: AC | PRN
  Administered 2017-08-26: 75 mL via INTRAVENOUS

## 2017-08-27 ENCOUNTER — Other Ambulatory Visit: Payer: Self-pay | Admitting: Internal Medicine

## 2017-08-27 DIAGNOSIS — D71 Functional disorders of polymorphonuclear neutrophils: Secondary | ICD-10-CM | POA: Insufficient documentation

## 2017-08-27 DIAGNOSIS — D860 Sarcoidosis of lung: Secondary | ICD-10-CM

## 2017-08-27 HISTORY — DX: Functional disorders of polymorphonuclear neutrophils: D71

## 2017-08-27 HISTORY — DX: Functional disorders of polymorphonuclear neutrophils: E83.52

## 2017-08-31 ENCOUNTER — Other Ambulatory Visit: Payer: Medicare Other

## 2017-08-31 ENCOUNTER — Other Ambulatory Visit: Payer: Self-pay | Admitting: Internal Medicine

## 2017-08-31 ENCOUNTER — Ambulatory Visit (INDEPENDENT_AMBULATORY_CARE_PROVIDER_SITE_OTHER): Payer: Medicare Other

## 2017-08-31 DIAGNOSIS — M797 Fibromyalgia: Secondary | ICD-10-CM | POA: Diagnosis not present

## 2017-08-31 DIAGNOSIS — A078 Other specified protozoal intestinal diseases: Secondary | ICD-10-CM

## 2017-08-31 DIAGNOSIS — Z111 Encounter for screening for respiratory tuberculosis: Secondary | ICD-10-CM

## 2017-08-31 DIAGNOSIS — G2581 Restless legs syndrome: Secondary | ICD-10-CM | POA: Diagnosis not present

## 2017-08-31 DIAGNOSIS — E785 Hyperlipidemia, unspecified: Secondary | ICD-10-CM

## 2017-08-31 NOTE — Progress Notes (Signed)
Pt had a TB test on her right lower arm, pt returns on 09/02/2017 for reading.

## 2017-09-01 DIAGNOSIS — Z79891 Long term (current) use of opiate analgesic: Secondary | ICD-10-CM | POA: Diagnosis not present

## 2017-09-01 DIAGNOSIS — G2581 Restless legs syndrome: Secondary | ICD-10-CM | POA: Diagnosis not present

## 2017-09-01 DIAGNOSIS — E039 Hypothyroidism, unspecified: Secondary | ICD-10-CM | POA: Diagnosis not present

## 2017-09-01 DIAGNOSIS — D649 Anemia, unspecified: Secondary | ICD-10-CM | POA: Diagnosis not present

## 2017-09-01 DIAGNOSIS — R7302 Impaired glucose tolerance (oral): Secondary | ICD-10-CM | POA: Diagnosis not present

## 2017-09-01 LAB — ANGIOTENSIN CONVERTING ENZYME: Angiotensin-Converting Enzyme: 113 U/L — ABNORMAL HIGH (ref 9–67)

## 2017-09-02 LAB — TB SKIN TEST
Induration: 0 mm
TB Skin Test: NEGATIVE

## 2017-09-03 ENCOUNTER — Telehealth: Payer: Self-pay | Admitting: Internal Medicine

## 2017-09-03 NOTE — Telephone Encounter (Signed)
Please advise 

## 2017-09-03 NOTE — Telephone Encounter (Signed)
Grace Schmitt would like to have her lab results from 9/4 she understands that Dr. Raliegh Ip is not in the office and the results are not ready.

## 2017-09-06 NOTE — Telephone Encounter (Signed)
Spoke with pt and informed her that the blood test confirmed sarcoidosis, Pt verbalized understanding. Yet is is concerned what is the next course of action for treatment.   Please advise

## 2017-09-06 NOTE — Telephone Encounter (Signed)
Blood tests was elevated consistent with the diagnosis of sarcoidosis PPD skin test was negative

## 2017-09-06 NOTE — Telephone Encounter (Signed)
Please confirm.  The patient has a pulmonary consultation scheduled

## 2017-09-06 NOTE — Telephone Encounter (Signed)
Yes Sir on 10/07/17.

## 2017-09-08 DIAGNOSIS — R413 Other amnesia: Secondary | ICD-10-CM | POA: Diagnosis not present

## 2017-09-14 ENCOUNTER — Encounter: Payer: Self-pay | Admitting: Pulmonary Disease

## 2017-09-14 ENCOUNTER — Ambulatory Visit (INDEPENDENT_AMBULATORY_CARE_PROVIDER_SITE_OTHER): Payer: Medicare Other | Admitting: Pulmonary Disease

## 2017-09-14 VITALS — BP 126/76 | HR 84 | Ht 62.0 in | Wt 164.2 lb

## 2017-09-14 DIAGNOSIS — R590 Localized enlarged lymph nodes: Secondary | ICD-10-CM

## 2017-09-14 DIAGNOSIS — H524 Presbyopia: Secondary | ICD-10-CM | POA: Diagnosis not present

## 2017-09-14 NOTE — Progress Notes (Signed)
Subjective:    Patient ID: Grace Schmitt, female    DOB: [birth year], 71 y.o.   MRN: [id number]  HPI Chief Complaint  Patient presents with  . Advice Only    referred for sarcoidosis by Dr. Burnice Logan.     Grace Schmitt is here to see me for a new diagnosis of possible sarcoidosis.  She notes that she has been having a severe worsening of her restless leg syndrome lately and one night it was particularly bad and she barely slept.  She says that she remembers feeling very cold and getting up and putting on a fleece jacket and blanket even though it was the middle of August.  She notes that that night she was actually quite confused and had memories of going outside but her husband says that she never actually went out. She had experienced episodes of confusion with cystitis in the past so they thought that she perhaps had a UTI.  The symptoms resolved temporarily but a few days later she had recurrence of symptoms and could not actually talk very well.  She was hospitalized for this and was found to have hypercalcemia.  As a part of her evaluation she had CT scans performed that showed some mediastinal lymphadenopathy and spleen enlargement with multiple nodules.    She was referred to Dr. Buddy Duty who suspects sarcoidosis.    She notes that in the last year she has been more fatigued than normal  She has had episodes of confusion that she can't explain either.  Specifically she says she is more forgetful than normal and "can't mind her p's and q's" like she normally can.  She actually underwent neuroscience evaluation for the confusion.  She says that she can't function as well as she used to.  She denies fevers or chills, but she has had hot sweats at night.  She has had problems with nausea in the evenings.  She denies shortness of breath.  No cough.  She has noticed some skin lesions lately.  They itch and burn and are located primarily on the scalp.  They seem to come and go as well.      She never  smoked in the past.     Past Medical History:  Diagnosis Date  . Allergy    prn claritin  . Fibromyalgia   . Hx of colonic polyps serrated and adenomatous 05/28/2005  . Hyperlipidemia   . Ischemic chest pain   . PONV (postoperative nausea and vomiting)   . Restless leg syndrome   . Thyroid disease      Family History  Problem Relation Age of Onset  . Dementia Mother   . Heart disease Mother        smal vessel disease  . Hypertension Brother   . Colon cancer Neg Hx   . Colon polyps Neg Hx   . Breast cancer Neg Hx      Social History   Social History  . Marital status: Married    Spouse name: N/A  . Number of children: N/A  . Years of education: N/A   Occupational History  . Not on file.   Social History Main Topics  . Smoking status: Never Smoker  . Smokeless tobacco: Never Used  . Alcohol use No  . Drug use: No  . Sexual activity: Not on file   Other Topics Concern  . Not on file   Social History Narrative  . No narrative on file     Allergies  Allergen Reactions  . Phenergan [Promethazine Hcl] Other (See Comments)    Pt has restless leg syndrome and the phenergan cause the RLS  to worsen      Outpatient Medications Prior to Visit  Medication Sig Dispense Refill  . aspirin EC 81 MG tablet Take 81 mg by mouth at bedtime.    Marland Kitchen aspirin-acetaminophen-caffeine (EXCEDRIN MIGRAINE) 250-250-65 MG tablet Take 2 tablets by mouth daily as needed (pain).     . Azelaic Acid 15 % cream After skin is thoroughly washed and patted dry, gently but thoroughly massage a thin film of azelaic acid cream into the affected area twice daily, in the morning and evening. (Patient taking differently: Apply topically See admin instructions. After skin is thoroughly washed and patted dry, gently but thoroughly massage a thin film of azelaic acid cream into the affected area twice daily, in the morning and evening as needed for rosacea) 50 g 4  . Cholecalciferol (VITAMIN D PO) Take 1  tablet by mouth daily.    . Coenzyme Q10 (COQ10) 100 MG CAPS Take 100 mg by mouth at bedtime.    Marland Kitchen Dextrose-Fructose-Sod Citrate (NAUZENE) [phone] MG CHEW Chew 2 tablets by mouth 2 (two) times daily as needed (nausea/vomiting).    . DULoxetine (CYMBALTA) 30 MG capsule Take 60 mg by mouth daily.     . Glucosamine-Chondroit-Vit C-Mn (GLUCOSAMINE-CHONDROITIN) CAPS Take 1 capsule by mouth daily.      Marland Kitchen levothyroxine (SYNTHROID, LEVOTHROID) 50 MCG tablet TAKE 1 TABLET (50 MCG TOTAL) BY MOUTH DAILY. (Patient taking differently: TAKE 1 TABLET (50 MCG TOTAL) BY MOUTH DAILY BEFORE BREAKFAST) 90 tablet 2  . loratadine (CLARITIN) 10 MG tablet Take 10 mg by mouth at bedtime.     . methadone (DOLOPHINE) 5 MG tablet Take 7.5 mg by mouth daily at 6 PM. For restless legs    . Multiple Vitamin (MULTIVITAMIN WITH MINERALS) TABS tablet Take 1 tablet by mouth 2 (two) times daily.    . ondansetron (ZOFRAN ODT) 4 MG disintegrating tablet Take 1 tablet (4 mg total) by mouth every 8 (eight) hours as needed for nausea or vomiting. 24 tablet 1  . OVER THE COUNTER MEDICATION Apply topically See admin instructions. Over the counter stop pain spray - apply topically at bedtime as needed for pain    . sennosides-docusate sodium (SENOKOT-S) 8.6-50 MG tablet Take 1 tablet by mouth as needed for constipation.    . Wheat Dextrin (BENEFIBER PO) Take 1 Dose by mouth daily.    . ondansetron (ZOFRAN) 4 MG tablet Take 2 tablets (8 mg total) by mouth every 8 (eight) hours as needed for nausea or vomiting. 12 tablet 0   No facility-administered medications prior to visit.       Review of Systems  Constitutional: Negative for fever and unexpected weight change.  HENT: Negative for congestion, dental problem, ear pain, nosebleeds, postnasal drip, rhinorrhea, sinus pressure, sneezing, sore throat and trouble swallowing.   Eyes: Negative for redness and itching.  Respiratory: Negative for cough, chest tightness, shortness of breath  and wheezing.   Cardiovascular: Negative for palpitations and leg swelling.  Gastrointestinal: Negative for nausea and vomiting.  Genitourinary: Negative for dysuria.  Musculoskeletal: Negative for joint swelling.  Skin: Negative for rash.  Neurological: Negative for headaches.  Hematological: Does not bruise/bleed easily.  Psychiatric/Behavioral: Negative for dysphoric mood. The patient is not nervous/anxious.        Objective:   Physical Exam Vitals:   09/14/17 1604  BP: 126/76  Pulse: 84  SpO2: 97%  Weight: 164 lb 3.2 oz (74.5 kg)  Height: 5\' 2"  (1.575 m)   RA  Gen: well appearing, no acute distress HENT: NCAT, OP clear, neck supple without masses Eyes: PERRL, EOMi Lymph: no cervical lymphadenopathy PULM: CTA B CV: RRR, no mgr, no JVD GI: BS+, soft, nontender, no hsm Derm: no rash or skin breakdown MSK: normal bulk and tone Neuro: A&Ox4, CN II-XII intact, strength 5/5 in all 4 extremities Psyche: normal mood and affect    CBC    Component Value Date/Time   WBC 7.2 08/21/2017 2225   RBC 4.13 08/21/2017 2225   HGB 12.2 08/21/2017 2225   HCT 37.5 08/21/2017 2225   PLT 301 08/21/2017 2225   MCV 90.8 08/21/2017 2225   MCH 29.5 08/21/2017 2225   MCHC 32.5 08/21/2017 2225   RDW 14.0 08/21/2017 2225   LYMPHSABS 1.6 08/13/2017 1904   MONOABS 0.9 08/13/2017 1904   EOSABS 0.4 08/13/2017 1904   BASOSABS 0.1 08/13/2017 1904   BMET    Component Value Date/Time   NA 137 08/21/2017 2225   K 4.0 08/21/2017 2225   CL 101 08/21/2017 2225   CO2 25 08/21/2017 2225   GLUCOSE 98 08/21/2017 2225   BUN 12 08/21/2017 2225   CREATININE 0.83 08/21/2017 2225   CALCIUM 10.6 (H) 08/21/2017 2225   CALCIUM 11.4 (H) 08/14/2017 0056   GFRNONAA >60 08/21/2017 2225   GFRAA >60 08/21/2017 2225   Other imaging: MRI brain August 2018 showed no evidence of a stroke  Chest imaging: August 2018 CT chest images independently reviewed showing upper lobe predominant bronchovascular  distributed pulmonary nodules, there is one predominant nodule in the right middle lobe which is about 1.1 cm in size. There is also significant mediastinal lymphadenopathy.  Echocardiogram: August 2018 transthoracic echocardiogram showing normal LVEF, grade 1 diastolic dysfunction  Hospitalization records from August 2018 reviewed with the patient was noted to have hypercalcemia treated with IV fluids. Total parathyroid value was 11 pg per mL (low), PTH related peptide was negative, 1 25-hydroxy vitamin D normal     Assessment & Plan:  Mediastinal lymphadenopathy - Plan: Ambulatory referral to Pulmonology  Discussion: Ms. Birchard was recently admitted for confusion in the setting of hypercalcemia. A routine workup for hypercalcemia showed no evidence of parathyroid related disease. Unfortunately I am unable to see the result of vitamin D3 testing, but in the setting of mediastinal lymphadenopathy I agree with the suspicion for sarcoidosis. However, we can't make a diagnosis of sarcoidosis without a biopsy so the differential diagnosis of lymphoma remains in this case. I'm encouraged by the fact that she does not have evidence of other cell lines abnormalities.  I will discuss her case further with Dr. Buddy Duty, but we'll make arrangements in the meantime for an EBUS guided needle aspiration of her mediastinal lymphadenopathy and transbronchial biopsies of the lung tissue.  Plan: For the enlarged lymph nodes and abnormal CT scan of the chest: I suspect sarcoidosis but we need to have a tissue diagnosis We will arrange for a bronchoscopy with biopsy most likely next Monday morning at [facility]. We will make arrangements for this over the next 24 hours and then call you to let you know exactly where to be and when.  We will see you back in 2-3 weeks to go over the results    Current Outpatient Prescriptions:  .  aspirin EC 81 MG tablet, Take 81 mg by mouth at bedtime., Disp: ,  Rfl:  .   aspirin-acetaminophen-caffeine (EXCEDRIN MIGRAINE) 250-250-65 MG tablet, Take 2 tablets by mouth daily as needed (pain). , Disp: , Rfl:  .  Azelaic Acid 15 % cream, After skin is thoroughly washed and patted dry, gently but thoroughly massage a thin film of azelaic acid cream into the affected area twice daily, in the morning and evening. (Patient taking differently: Apply topically See admin instructions. After skin is thoroughly washed and patted dry, gently but thoroughly massage a thin film of azelaic acid cream into the affected area twice daily, in the morning and evening as needed for rosacea), Disp: 50 g, Rfl: 4 .  Cholecalciferol (VITAMIN D PO), Take 1 tablet by mouth daily., Disp: , Rfl:  .  Coenzyme Q10 (COQ10) 100 MG CAPS, Take 100 mg by mouth at bedtime., Disp: , Rfl:  .  Dextrose-Fructose-Sod Citrate (NAUZENE) [phone] MG CHEW, Chew 2 tablets by mouth 2 (two) times daily as needed (nausea/vomiting)., Disp: , Rfl:  .  DULoxetine (CYMBALTA) 30 MG capsule, Take 60 mg by mouth daily. , Disp: , Rfl:  .  Glucosamine-Chondroit-Vit C-Mn (GLUCOSAMINE-CHONDROITIN) CAPS, Take 1 capsule by mouth daily.  , Disp: , Rfl:  .  levothyroxine (SYNTHROID, LEVOTHROID) 50 MCG tablet, TAKE 1 TABLET (50 MCG TOTAL) BY MOUTH DAILY. (Patient taking differently: TAKE 1 TABLET (50 MCG TOTAL) BY MOUTH DAILY BEFORE BREAKFAST), Disp: 90 tablet, Rfl: 2 .  loratadine (CLARITIN) 10 MG tablet, Take 10 mg by mouth at bedtime. , Disp: , Rfl:  .  methadone (DOLOPHINE) 5 MG tablet, Take 7.5 mg by mouth daily at 6 PM. For restless legs, Disp: , Rfl:  .  Multiple Vitamin (MULTIVITAMIN WITH MINERALS) TABS tablet, Take 1 tablet by mouth 2 (two) times daily., Disp: , Rfl:  .  ondansetron (ZOFRAN ODT) 4 MG disintegrating tablet, Take 1 tablet (4 mg total) by mouth every 8 (eight) hours as needed for nausea or vomiting., Disp: 24 tablet, Rfl: 1 .  OVER THE COUNTER MEDICATION, Apply topically See admin instructions. Over the counter  stop pain spray - apply topically at bedtime as needed for pain, Disp: , Rfl:  .  sennosides-docusate sodium (SENOKOT-S) 8.6-50 MG tablet, Take 1 tablet by mouth as needed for constipation., Disp: , Rfl:  .  Wheat Dextrin (BENEFIBER PO), Take 1 Dose by mouth daily., Disp: , Rfl:

## 2017-09-14 NOTE — Patient Instructions (Signed)
For the enlarged lymph nodes and abnormal CT scan of the chest: I suspect sarcoidosis but we need to have a tissue diagnosis We will arrange for a bronchoscopy with biopsy most likely next Monday morning at [facility]. We will make arrangements for this over the next 24 hours and then call you to let you know exactly where to be and when.  We will see you back in 2-3 weeks to go over the results

## 2017-09-15 ENCOUNTER — Institutional Professional Consult (permissible substitution): Payer: Medicare Other | Admitting: Pulmonary Disease

## 2017-09-16 ENCOUNTER — Encounter (HOSPITAL_COMMUNITY): Payer: Self-pay

## 2017-09-16 ENCOUNTER — Encounter: Payer: Self-pay | Admitting: Internal Medicine

## 2017-09-20 ENCOUNTER — Ambulatory Visit (HOSPITAL_COMMUNITY)
Admission: RE | Admit: 2017-09-20 | Discharge: 2017-09-20 | Disposition: A | Discharge status: Home / Self Care | Payer: Medicare Other | Source: Ambulatory Visit | Attending: Pulmonary Disease | Admitting: Pulmonary Disease

## 2017-09-20 ENCOUNTER — Ambulatory Visit (HOSPITAL_COMMUNITY): Payer: Medicare Other | Admitting: Anesthesiology

## 2017-09-20 ENCOUNTER — Encounter (HOSPITAL_COMMUNITY)
Admission: RE | Disposition: A | Discharge status: Home / Self Care | Payer: Self-pay | Source: Ambulatory Visit | Attending: Pulmonary Disease

## 2017-09-20 ENCOUNTER — Encounter (HOSPITAL_COMMUNITY): Payer: Self-pay | Admitting: *Deleted

## 2017-09-20 DIAGNOSIS — J984 Other disorders of lung: Secondary | ICD-10-CM | POA: Insufficient documentation

## 2017-09-20 DIAGNOSIS — R918 Other nonspecific abnormal finding of lung field: Secondary | ICD-10-CM | POA: Insufficient documentation

## 2017-09-20 DIAGNOSIS — J40 Bronchitis, not specified as acute or chronic: Secondary | ICD-10-CM | POA: Diagnosis not present

## 2017-09-20 DIAGNOSIS — E785 Hyperlipidemia, unspecified: Secondary | ICD-10-CM | POA: Diagnosis not present

## 2017-09-20 DIAGNOSIS — D860 Sarcoidosis of lung: Secondary | ICD-10-CM

## 2017-09-20 DIAGNOSIS — D869 Sarcoidosis, unspecified: Secondary | ICD-10-CM | POA: Diagnosis not present

## 2017-09-20 DIAGNOSIS — F329 Major depressive disorder, single episode, unspecified: Secondary | ICD-10-CM | POA: Insufficient documentation

## 2017-09-20 DIAGNOSIS — Z79899 Other long term (current) drug therapy: Secondary | ICD-10-CM | POA: Insufficient documentation

## 2017-09-20 DIAGNOSIS — R590 Localized enlarged lymph nodes: Secondary | ICD-10-CM | POA: Insufficient documentation

## 2017-09-20 DIAGNOSIS — Z7982 Long term (current) use of aspirin: Secondary | ICD-10-CM | POA: Insufficient documentation

## 2017-09-20 DIAGNOSIS — G2581 Restless legs syndrome: Secondary | ICD-10-CM | POA: Insufficient documentation

## 2017-09-20 DIAGNOSIS — E039 Hypothyroidism, unspecified: Secondary | ICD-10-CM | POA: Diagnosis not present

## 2017-09-20 DIAGNOSIS — M797 Fibromyalgia: Secondary | ICD-10-CM | POA: Insufficient documentation

## 2017-09-20 DIAGNOSIS — R848 Other abnormal findings in specimens from respiratory organs and thorax: Secondary | ICD-10-CM | POA: Diagnosis not present

## 2017-09-20 HISTORY — DX: Headache, unspecified: R51.9

## 2017-09-20 HISTORY — DX: Headache: R51

## 2017-09-20 HISTORY — DX: Hypothyroidism, unspecified: E03.9

## 2017-09-20 HISTORY — PX: ENDOBRONCHIAL ULTRASOUND: SHX5096

## 2017-09-20 HISTORY — DX: Sarcoidosis, unspecified: D86.9

## 2017-09-20 HISTORY — DX: Depression, unspecified: F32.A

## 2017-09-20 HISTORY — DX: Major depressive disorder, single episode, unspecified: F32.9

## 2017-09-20 LAB — BODY FLUID CELL COUNT WITH DIFFERENTIAL
Eos, Fluid: 1 %
Lymphs, Fluid: 41 %
Monocyte-Macrophage-Serous Fluid: 51 % (ref 50–90)
Neutrophil Count, Fluid: 7 % (ref 0–25)
Total Nucleated Cell Count, Fluid: 43 cu mm (ref 0–1000)

## 2017-09-20 SURGERY — ENDOBRONCHIAL ULTRASOUND (EBUS)
Anesthesia: General | Laterality: Bilateral

## 2017-09-20 MED ORDER — EPHEDRINE 5 MG/ML INJ
INTRAVENOUS | Status: AC
  Filled 2017-09-20: qty 10

## 2017-09-20 MED ORDER — DEXAMETHASONE SODIUM PHOSPHATE 10 MG/ML IJ SOLN
INTRAMUSCULAR | Status: DC | PRN
  Administered 2017-09-20: 10 mg via INTRAVENOUS

## 2017-09-20 MED ORDER — PROPOFOL 10 MG/ML IV BOLUS
INTRAVENOUS | Status: DC | PRN
  Administered 2017-09-20: 150 mg via INTRAVENOUS
  Administered 2017-09-20: 50 mg via INTRAVENOUS

## 2017-09-20 MED ORDER — FENTANYL CITRATE (PF) 100 MCG/2ML IJ SOLN
INTRAMUSCULAR | Status: DC | PRN
  Administered 2017-09-20: 50 ug via INTRAVENOUS
  Administered 2017-09-20: 100 ug via INTRAVENOUS

## 2017-09-20 MED ORDER — FENTANYL CITRATE (PF) 100 MCG/2ML IJ SOLN
INTRAMUSCULAR | Status: AC
  Filled 2017-09-20: qty 2

## 2017-09-20 MED ORDER — LIDOCAINE 2% (20 MG/ML) 5 ML SYRINGE
INTRAMUSCULAR | Status: DC | PRN
  Administered 2017-09-20: 100 mg via INTRAVENOUS

## 2017-09-20 MED ORDER — PROPOFOL 10 MG/ML IV BOLUS
INTRAVENOUS | Status: AC
Start: 2017-09-20 — End: 2017-09-20
  Filled 2017-09-20: qty 20

## 2017-09-20 MED ORDER — ONDANSETRON HCL 4 MG/2ML IJ SOLN: 4.0000 mg | Freq: Once | INTRAMUSCULAR | Status: DC | PRN

## 2017-09-20 MED ORDER — PHENYLEPHRINE 40 MCG/ML (10ML) SYRINGE FOR IV PUSH (FOR BLOOD PRESSURE SUPPORT)
PREFILLED_SYRINGE | INTRAVENOUS | Status: AC
  Filled 2017-09-20: qty 10

## 2017-09-20 MED ORDER — EPHEDRINE SULFATE-NACL 50-0.9 MG/10ML-% IV SOSY
PREFILLED_SYRINGE | INTRAVENOUS | Status: DC | PRN
  Administered 2017-09-20: 15 mg via INTRAVENOUS
  Administered 2017-09-20: 25 mg via INTRAVENOUS
  Administered 2017-09-20: 10 mg via INTRAVENOUS

## 2017-09-20 MED ORDER — FENTANYL CITRATE (PF) 100 MCG/2ML IJ SOLN: 25.0000 ug | INTRAMUSCULAR | Status: DC | PRN

## 2017-09-20 MED ORDER — LACTATED RINGERS IV SOLN
INTRAVENOUS | Status: DC | PRN
  Administered 2017-09-20 (×2): via INTRAVENOUS

## 2017-09-20 MED ORDER — SUCCINYLCHOLINE CHLORIDE 200 MG/10ML IV SOSY
PREFILLED_SYRINGE | INTRAVENOUS | Status: DC | PRN
  Administered 2017-09-20: 100 mg via INTRAVENOUS

## 2017-09-20 MED ORDER — ONDANSETRON HCL 4 MG/2ML IJ SOLN
INTRAMUSCULAR | Status: DC | PRN
  Administered 2017-09-20: 4 mg via INTRAVENOUS

## 2017-09-20 MED ORDER — PHENYLEPHRINE 40 MCG/ML (10ML) SYRINGE FOR IV PUSH (FOR BLOOD PRESSURE SUPPORT)
PREFILLED_SYRINGE | INTRAVENOUS | Status: DC | PRN
  Administered 2017-09-20 (×2): 80 ug via INTRAVENOUS

## 2017-09-20 NOTE — Transfer of Care (Signed)
Immediate Anesthesia Transfer of Care Note  Patient: Grace Schmitt  Procedure(s) Performed: Procedure(s): ENDOBRONCHIAL ULTRASOUND (Bilateral)  Patient Location: PACU  Anesthesia Type:General  Level of Consciousness: sedated  Airway & Oxygen Therapy: Patient Spontanous Breathing and Patient connected to face mask oxygen  Post-op Assessment: Report given to RN and Post -op Vital signs reviewed and stable  Post vital signs: Reviewed and stable  Last Vitals:  Vitals:   09/20/17 1230  BP: 132/74  Pulse: 75  Resp: 12  Temp: 36.8 C  SpO2: 98%    Last Pain:  Vitals:   09/20/17 1230  TempSrc: Oral         Complications: No apparent anesthesia complications

## 2017-09-20 NOTE — Progress Notes (Signed)
[facility]  I met with Grace Schmitt and her husband.  She is doing well post operatively.  I informed them that she had granulomas on biopsy which means that she most likely has sarcoidosis.  I will plan to treat her with 3 months of prednisone, but I would prefer to wait for 72 hours to make sure the cultures and special stains will be negative.  If cultures negative by 9/28; will plan for prednisone 64m daily x 1 month, 265mdaily x1 month, 1061maily x1 month.  BreRoselie AwkwardD LeBWaikapuCM Pager: [phone]: [phone] 3pm or if no response, call [phone]

## 2017-09-20 NOTE — Anesthesia Preprocedure Evaluation (Addendum)
Anesthesia Evaluation  Patient identified by MRN, date of birth, ID band Patient awake    Reviewed: Allergy & Precautions, NPO status , Patient's Chart, lab work & pertinent test results  History of Anesthesia Complications (+) PONV  Airway Mallampati: III  TM Distance: >3 FB Neck ROM: Full    Dental no notable dental hx. (+) Dental Advisory Given   Pulmonary  Sarcoidosis   Pulmonary exam normal breath sounds clear to auscultation       Cardiovascular negative cardio ROS Normal cardiovascular exam Rhythm:Regular Rate:Normal  TTE 07/2017 - normal EF, grade 1 diastolic dysfx  Carotid US 07/2017 - 1-39% b/l ICAS   Neuro/Psych  Headaches, PSYCHIATRIC DISORDERS Depression    GI/Hepatic negative GI ROS, Neg liver ROS,   Endo/Other  Hypothyroidism Obesity  Renal/GU negative Renal ROS  negative genitourinary   Musculoskeletal  (+) Fibromyalgia -, narcotic dependent  Abdominal   Peds  Hematology negative hematology ROS (+)   Anesthesia Other Findings   Reproductive/Obstetrics                           Anesthesia Physical Anesthesia Plan  ASA: II  Anesthesia Plan: General   Post-op Pain Management:    Induction: Intravenous  PONV Risk Score and Plan: 4 or greater and Ondansetron, Dexamethasone, Midazolam, Scopolamine patch - Pre-op, Propofol infusion and Treatment may vary due to age or medical condition  Airway Management Planned: Oral ETT  Additional Equipment: None  Intra-op Plan:   Post-operative Plan: Extubation in OR  Informed Consent: I have reviewed the patients History and Physical, chart, labs and discussed the procedure including the risks, benefits and alternatives for the proposed anesthesia with the patient or authorized representative who has indicated his/her understanding and acceptance.   Dental advisory given  Plan Discussed with: CRNA  Anesthesia Plan Comments:          Anesthesia Quick Evaluation

## 2017-09-20 NOTE — Discharge Instructions (Signed)
Flexible Bronchoscopy, Care After These instructions give you information on caring for yourself after your procedure. Your doctor may also give you more specific instructions. Call your doctor if you have any problems or questions after your procedure. Follow these instructions at home:  Do not eat or drink anything for 2 hours after your procedure. If you try to eat or drink before the medicine wears off, food or drink could go into your lungs. You could also burn yourself.  After 2 hours have passed and when you can cough and gag normally, you may eat soft food and drink liquids slowly.  The day after the test, you may eat your normal diet.  You may do your normal activities.  Keep all doctor visits. Get help right away if:  You get more and more short of breath.  You get light-headed.  You feel like you are going to pass out (faint).  You have chest pain.  You have new problems that worry you.  You cough up more than a little blood.  You cough up more blood than before. This information is not intended to replace advice given to you by your health care provider. Make sure you discuss any questions you have with your health care provider. Document Released: 10/11/2009 Document Revised: 05/21/2016 Document Reviewed: 08/18/2013 Elsevier Interactive Patient Education  2017 [employer] Anesthesia, Adult, Care After These instructions provide you with information about caring for yourself after your procedure. Your health care provider may also give you more specific instructions. Your treatment has been planned according to current medical practices, but problems sometimes occur. Call your health care provider if you have any problems or questions after your procedure. What can I expect after the procedure? After the procedure, it is common to have:  Vomiting.  A sore throat.  Mental slowness.  It is common to feel:  Nauseous.  Cold or  shivery.  Sleepy.  Tired.  Sore or achy, even in parts of your body where you did not have surgery.  Follow these instructions at home: For at least 24 hours after the procedure:  Do not: ? Participate in activities where you could fall or become injured. ? Drive. ? Use heavy machinery. ? Drink alcohol. ? Take sleeping pills or medicines that cause drowsiness. ? Make important decisions or sign legal documents. ? Take care of children on your own.  Rest. Eating and drinking  If you vomit, drink water, juice, or soup when you can drink without vomiting.  Drink enough fluid to keep your urine clear or pale yellow.  Make sure you have little or no nausea before eating solid foods.  Follow the diet recommended by your health care provider. General instructions  Have a responsible adult stay with you until you are awake and alert.  Return to your normal activities as told by your health care provider. Ask your health care provider what activities are safe for you.  Take over-the-counter and prescription medicines only as told by your health care provider.  If you smoke, do not smoke without supervision.  Keep all follow-up visits as told by your health care provider. This is important. Contact a health care provider if:  You continue to have nausea or vomiting at home, and medicines are not helpful.  You cannot drink fluids or start eating again.  You cannot urinate after 8-12 hours.  You develop a skin rash.  You have fever.  You have increasing redness at the site of  your procedure. Get help right away if:  You have difficulty breathing.  You have chest pain.  You have unexpected bleeding.  You feel that you are having a life-threatening or urgent problem. This information is not intended to replace advice given to you by your health care provider. Make sure you discuss any questions you have with your health care provider. Document Released: 03/22/2001  Document Revised: 05/18/2016 Document Reviewed: 11/28/2015 Elsevier Interactive Patient Education  [employer].

## 2017-09-20 NOTE — Progress Notes (Signed)
Video bronchoscopy performed following EBUS procedure Intervention bronchial washings Intervention bronchial biopsies Pt tolerated well  Mccrae Speciale David  RRT 

## 2017-09-20 NOTE — Anesthesia Postprocedure Evaluation (Signed)
Anesthesia Post Note  Patient: Psychologist, prison and probation services  Procedure(s) Performed: Procedure(s) (LRB): ENDOBRONCHIAL ULTRASOUND (Bilateral)     Patient location during evaluation: PACU Anesthesia Type: General Level of consciousness: awake and alert Pain management: pain level controlled Vital Signs Assessment: post-procedure vital signs reviewed and stable Respiratory status: spontaneous breathing, nonlabored ventilation, respiratory function stable and patient connected to nasal cannula oxygen Cardiovascular status: blood pressure returned to baseline and stable Postop Assessment: no apparent nausea or vomiting Anesthetic complications: no    Last Vitals:  Vitals:   09/20/17 1450 09/20/17 1500  BP: (!) 140/58 (!) 142/67  Pulse: (!) 106 (!) 101  Resp: 16 16  Temp:    SpO2: 96% 97%    Last Pain:  Vitals:   09/20/17 1437  TempSrc: Oral                 Audry Pili

## 2017-09-20 NOTE — Anesthesia Procedure Notes (Signed)
Procedure Name: Intubation Date/Time: 09/20/2017 1:40 PM Performed by: Lind Covert Pre-anesthesia Checklist: Patient identified, Emergency Drugs available, Suction available, Patient being monitored and Timeout performed Patient Re-evaluated:Patient Re-evaluated prior to induction Oxygen Delivery Method: Circle system utilized Preoxygenation: Pre-oxygenation with 100% oxygen Induction Type: IV induction Laryngoscope Size: Mac and 3 Grade View: Grade I Tube size: 9.0 mm Number of attempts: 1 Airway Equipment and Method: Stylet Placement Confirmation: ETT inserted through vocal cords under direct vision,  positive ETCO2 and breath sounds checked- equal and bilateral Secured at: 21 cm Tube secured with: Tape Dental Injury: Teeth and Oropharynx as per pre-operative assessment

## 2017-09-20 NOTE — Op Note (Addendum)
[facility] Cardiopulmonary Patient Name: Grace Schmitt Procedure Date: 09/20/2017 MRN: [id number] Attending MD: Juanito Doom , MD Date of Birth: 02-25-1946 CSN: [id number] Age: 71 Admit Type: Outpatient Ethnicity: Not Hispanic or Latino Procedure:            Bronchoscopy Indications:          Diffuse lung disease, Mediastinal adenopathy Providers:            Nathaneil Canary B. Lake Bells, MD, Cherylynn Ridges, Technician,                        Almedia Alday CRNA, CRNA, Burtis Junes, RN Referring MD:          Medicines:            General Anesthesia Complications:        No immediate complications Estimated Blood Loss: Estimated blood loss: none. Procedure:      Pre-Anesthesia Assessment:      - A History and Physical has been performed. Patient meds and allergies       have been reviewed. The risks and benefits of the procedure and the       sedation options and risks were discussed with the patient. All       questions were answered and informed consent was obtained. Patient       identification and proposed procedure were verified prior to the       procedure by the physician, the nurse and the anesthesiologist in the       pre-procedure area. Mental Status Examination: normal. Airway       Examination: normal oropharyngeal airway. Respiratory Examination: clear       to auscultation. CV Examination: normal. ASA Grade Assessment: II - A       patient with mild systemic disease. After reviewing the risks and       benefits, the patient was deemed in satisfactory condition to undergo       the procedure. The anesthesia plan was to use general anesthesia.       Immediately prior to administration of medications, the patient was       re-assessed for adequacy to receive sedatives. The heart rate,       respiratory rate, oxygen saturations, blood pressure, adequacy of       pulmonary ventilation, and response to care were monitored throughout       the procedure. The  physical status of the patient was re-assessed after       the procedure.      After obtaining informed consent, the bronchoscope was passed under       direct vision. Throughout the procedure, the patient's blood pressure,       pulse, and oxygen saturations were monitored continuously. the XN-[id number]       ( [id number]) scope was introduced through the mouth, via the endotracheal       tube and advanced to the tracheobronchial tree. The procedure was       accomplished without difficulty. The patient tolerated the procedure       well. The total duration of the procedure was 45 minutes. Findings:      Bilateral Lung Abnormalities: Two small non-obstructing nodules were       found in the left upper lobe. Nodular mucosa was found in the right       lower lobe. Nodular mucosa was found in the right upper lobe. An  endobronchial ultrasound endoscope was utilized in order to assist with       fine needle aspiration in the pretracheal area and in the subcarinal       area. Lesion sizing was performed via endobronchial ultrasound for FNA,       presumed sarcoidosis. Sampling by transbronchial needle aspiration was       also performed using an Olympus EBUS-TBNA 21 gauge needle in the       pretracheal area and in the subcarinal area and sent for routine       cytology.      - The lesion in the subcarinal area measured 25 mm by EBUS. Four samples       with the needle were obtained.      - The lesion in the pretracheal area measured 12 mm by EBUS. Four       samples with the needle were obtained. Impression:      - Diffuse lung disease      - Mediastinal adenopathy      - Two nodules were found in the left upper lobe.      - Nodular mucosa was visualized in the right lower lobe.      - Nodular mucosa was visualized in the right upper lobe.      - Endobronchial ultrasound was performed.      - Lesion sizing and sampling was performed. Tissue was obtained from       this exam. The  preliminary diagnosis is consistent with sarcoid. Moderate Sedation:      GENERAL Recommendation:      - Await culture results. Procedure Code(s):      --- Professional ---      [phone], Bronchoscopy, rigid or flexible, including fluoroscopic guidance,       when performed; diagnostic, with cell washing, when performed (separate       procedure)      [phone], Bronchoscopy, rigid or flexible, including fluoroscopic guidance,       when performed; with transendoscopic endobronchial ultrasound (EBUS)       during bronchoscopic diagnostic or therapeutic intervention(s) for       peripheral lesion(s) (List separately in addition to code for primary       procedure[s])      [phone], Unlisted procedure, trachea, bronchi Diagnosis Code(s):      --- Professional ---      R91.8, Other nonspecific abnormal finding of lung field      R59.9, Enlarged lymph nodes, unspecified      J98.4, Other disorders of lung CPT copyright 2016 [employer]. All rights reserved. The codes documented in this report are preliminary and upon coder review may  be revised to meet current compliance requirements. Norlene Campbell, MD Juanito Doom, MD 09/20/2017 2:57:08 PM This report has been signed electronically. Number of Addenda: 1 Scope In: Scope Out: Addendum Number: 1   Addendum Date: 09/30/2017 2:01:21 PM      Of note:      Endobronchial biopsies were obtained from the bronchial mucosa from the       RLL and RML.      Transbronchial needle aspirations of the lymph node tissue in the       subcarinal and 4R location were also performed. Norlene Campbell, MD Juanito Doom, MD 09/30/2017 2:02:21 PM This report has been signed electronically.

## 2017-09-20 NOTE — H&P (Signed)
[facility] H&P  HPI: Grace Schmitt was recently found to have diffuse lymphadenopathy and hypercalcemia.  She is here today for a mediastinal lymph nodes aspiration biopsy.  Past Medical History:  Diagnosis Date  . Allergy    prn claritin  . Depression   . Fibromyalgia   . Headache    hx of none since menopause  . Hx of colonic polyps serrated and adenomatous 05/28/2005  . Hyperlipidemia   . Hypothyroidism   . Ischemic chest pain   . PONV (postoperative nausea and vomiting)   . Restless leg syndrome   . Sarcoidosis    lung mass  . Thyroid disease      Family History  Problem Relation Age of Onset  . Dementia Mother   . Heart disease Mother        smal vessel disease  . Hypertension Brother   . Colon cancer Neg Hx   . Colon polyps Neg Hx   . Breast cancer Neg Hx      Social History   Social History  . Marital status: Married    Spouse name: N/A  . Number of children: N/A  . Years of education: N/A   Occupational History  . Not on file.   Social History Main Topics  . Smoking status: Never Smoker  . Smokeless tobacco: Never Used  . Alcohol use No  . Drug use: No  . Sexual activity: Not Currently   Other Topics Concern  . Not on file   Social History Narrative  . No narrative on file     Allergies  Allergen Reactions  . Phenergan [Promethazine Hcl] Other (See Comments)    Pt has restless leg syndrome and the phenergan cause the RLS  to worsen      @encmedstart @ Vitals:   09/20/17 1230  BP: 132/74  Pulse: 75  Resp: 12  Temp: 98.2 F (36.8 C)  TempSrc: Oral  SpO2: 98%  Weight: 164 lb (74.4 kg)  Height: 5\' 3"  (1.6 m)   Gen: well appearing HENT: OP clear, TM's clear, neck supple PULM: CTA B, normal percussion CV: RRR, no mgr, trace edema GI: BS+, soft, nontender Derm: no cyanosis or rash Psyche: normal mood and affect  CBC    Component Value Date/Time   WBC 7.2 08/21/2017 2225   RBC 4.13 08/21/2017 2225   HGB 12.2 08/21/2017 2225   HCT  37.5 08/21/2017 2225   PLT 301 08/21/2017 2225   MCV 90.8 08/21/2017 2225   MCH 29.5 08/21/2017 2225   MCHC 32.5 08/21/2017 2225   RDW 14.0 08/21/2017 2225   LYMPHSABS 1.6 08/13/2017 1904   MONOABS 0.9 08/13/2017 1904   EOSABS 0.4 08/13/2017 1904   BASOSABS 0.1 08/13/2017 1904    CT chest: diffuse lympahdenoapthy, upper lob predominant nodularity  Impression/Plan: Lymphadenopathy: EBUS bronchoscopy today, transbronchial biopsy under general anesthesia  Roselie Awkward, MD [facility] Pager: [phone] Cell: [phone] After 3pm or if no response, call [phone]

## 2017-09-21 ENCOUNTER — Encounter (HOSPITAL_COMMUNITY): Payer: Self-pay | Admitting: Pulmonary Disease

## 2017-09-21 DIAGNOSIS — D649 Anemia, unspecified: Secondary | ICD-10-CM | POA: Diagnosis not present

## 2017-09-21 DIAGNOSIS — E039 Hypothyroidism, unspecified: Secondary | ICD-10-CM | POA: Diagnosis not present

## 2017-09-21 DIAGNOSIS — M8580 Other specified disorders of bone density and structure, unspecified site: Secondary | ICD-10-CM | POA: Diagnosis not present

## 2017-09-21 DIAGNOSIS — J8410 Pulmonary fibrosis, unspecified: Secondary | ICD-10-CM | POA: Diagnosis not present

## 2017-09-22 ENCOUNTER — Encounter: Payer: Self-pay | Admitting: Acute Care

## 2017-09-22 ENCOUNTER — Telehealth: Payer: Self-pay | Admitting: Pulmonary Disease

## 2017-09-22 ENCOUNTER — Ambulatory Visit (INDEPENDENT_AMBULATORY_CARE_PROVIDER_SITE_OTHER)
Admission: RE | Admit: 2017-09-22 | Discharge: 2017-09-22 | Disposition: A | Discharge status: Home / Self Care | Payer: Medicare Other | Source: Ambulatory Visit | Attending: Pulmonary Disease | Admitting: Pulmonary Disease

## 2017-09-22 ENCOUNTER — Ambulatory Visit (INDEPENDENT_AMBULATORY_CARE_PROVIDER_SITE_OTHER): Payer: Medicare Other | Admitting: Acute Care

## 2017-09-22 VITALS — BP 122/82 | HR 82 | Ht 63.0 in | Wt 164.6 lb

## 2017-09-22 DIAGNOSIS — R079 Chest pain, unspecified: Secondary | ICD-10-CM | POA: Diagnosis not present

## 2017-09-22 DIAGNOSIS — D860 Sarcoidosis of lung: Secondary | ICD-10-CM

## 2017-09-22 DIAGNOSIS — R05 Cough: Secondary | ICD-10-CM | POA: Diagnosis not present

## 2017-09-22 LAB — ACID FAST SMEAR (AFB, MYCOBACTERIA): Acid Fast Smear: NEGATIVE

## 2017-09-22 MED ORDER — PREDNISONE 10 MG PO TABS: 30.0000 mg | ORAL_TABLET | Freq: Every day | ORAL | 0 refills | Status: DC

## 2017-09-22 NOTE — Patient Instructions (Addendum)
It is good to see you today. Chest x-ray today Your CXR is normal today. We will add Mucinex 1200 mg once or twice daily for mucus thinning. Cephacol lozenges for sore throat. Z pack as directed Start prednisone 30 mg daily  Follow up Tuesday 09/28/2017 with Rexene Edison, NP as is scheduled. Please contact office for sooner follow up if symptoms do not improve or worsen or seek emergency care

## 2017-09-22 NOTE — Assessment & Plan Note (Addendum)
09/20/2017>>EBUS guided needle aspiration of her mediastinal lymphadenopathy and transbronchial biopsies of the lung tissue  to diagnose suspected sarcoid.  Awoke 09/21/2017 with generalized body aches and sore throat, difficulty coughing forcefully. Cultures after 48 hours showed no growth AFB negative after 48 hours Plan Chest x-ray today Your CXR is normal today. We will add Mucinex 1200 mg once or twice daily for mucus thinning. Cephacol lozenges for sore throat. Z pack as directed Await final results cytology/BAL culture Start prednisone 30 mg daily 1 month, then 20 mg daily 1 month, then 10 mg daily 1 month Follow up Tuesday 09/28/2017 with Rexene Edison, NP as is scheduled to ensure you're improving. Please contact office for sooner follow up if symptoms do not improve or worsen or seek emergency care

## 2017-09-22 NOTE — Progress Notes (Signed)
History of Present Illness Grace Schmitt is a 71 y.o. female with sarcoidosis. She is followed by Dr. Lake Bells.  Discussion: Grace Schmitt was recently admitted for confusion in the setting of hypercalcemia. A routine workup for hypercalcemia showed no evidence of parathyroid related disease. In the setting of mediastinal lymphadenopathy I agree with the suspicion for sarcoidosis. However, we can't make a diagnosis of sarcoidosis without a biopsy so the differential diagnosis of lymphoma remains in this case. She does not have evidence of other cell lines abnormalities.   EBUS guided needle aspiration of her mediastinal lymphadenopathy and transbronchial biopsies of the lung tissue were done 09/20/2017. Pathology and cultures are pending.  09/22/2017 Pt. Presents today for follow up.  She was seen by Dr. Lake Bells 09/20/2017 for EBUS guided needle aspiration of her mediastinal lymphadenopathy and transbronchial biopsies of the lung tissue  to diagnose suspected sarcoid.  She returns today for an acute visit. She states she has generalized pain that started 09/21/2017 when she woke up. She states she has some drainage in her throat feels like mucus  she is trying to cough up. She has been unable to cough it up, she cannot cough forcefully due to the fact it hurts too much.When she coughs she complains of abdominal rib like pain, and sore throat pain. She denies fever,she states she has generalized pain that she states feels musculoskeletal. She does have fibromyalgia but endorses that this pain is different. She states she did not cough much after the procedure. Patient denies orthopnea or hemoptysis. She is in no apparent distress.  This case was discussed with Dr. Lake Bells at the time of the patient's office visit. Plan of care was developed in conjunction with Dr. Lake Bells.  Test Results: BAL cytology 09/20/2017 09/20/2017:FINE NEEDLE ASPIRATION, ENDOSCOPIC, (EBUS) # 7 NODE (SPECIMEN 1 OF 3 COLLECTED  09/20/17): BENIGN REACTIVE/REPARATIVE CHANGES. MULTINUCLEATED GIANTS ARE PRESENT. 09/20/2017: FINE NEEDLE ASPIRATION, ENDOSCOPIC, (EBUS) 4R NODE (SPECIMEN 2 OF 3 COLLECTED 09/20/14): BENIGN REACTIVE/REPARATIVE CHANGES. MULTINUCLEATED GIANT CELLS ARE PRESENT 09/20/2017 :BRONCHIAL LAVAGE,(EBUS) RUL ANTERIOR SEGMENT (SPECIMEN 3 OF 3 COLLECTED 09/20/17): BENIGN REACTIVE/REPARATIVE CHANGES.  Sputum AFB 09/20/2017>> AFB negative Sputum Fungus 09/20/2017>> No growth after 48 hours final result is Pending Sputum Culture 09/20/2017>> No growth after 48 hours final result is Pending   CXR 09/22/2017 IMPRESSION: Chronic bronchitic and interstitial lung disease changes with hilar adenopathy consistent with history of sarcoidosis.  No acute abnormalities.  CBC Latest Ref Rng & Units 08/21/2017 08/16/2017 08/15/2017  WBC 4.0 - 10.5 K/uL 7.2 6.8 6.7  Hemoglobin 12.0 - 15.0 g/dL 12.2 12.3 11.6(L)  Hematocrit 36.0 - 46.0 % 37.5 37.6 35.7(L)  Platelets 150 - 400 K/uL 301 236 232    BMP Latest Ref Rng & Units 08/21/2017 08/16/2017 08/15/2017  Glucose 65 - 99 mg/dL 98 112(H) 97  BUN 6 - 20 mg/dL _0 Creatinine 0.44 - 1.00 mg/dL 0.83 0.85 0.84  Sodium 135 - 145 mmol/L 137 139 140  Potassium 3.5 - 5.1 mmol/L 4.0 3.7 3.7  Chloride 101 - 111 mmol/L 101 108 111  CO2 22 - 32 mmol/L _1 Calcium 8.9 - 10.3 mg/dL 10.6(H) 10.1 9.9    Dg Chest 2 View  Result Date: 09/22/2017 CLINICAL DATA:  Cough, congestion and chest tightness, lung biopsy 4 days ago, pulmonary sarcoidosis, fibromyalgia, hyperlipidemia EXAM: CHEST  2 VIEW COMPARISON:  CT chest 08/26/2017 FINDINGS: Upper normal size cardiac silhouette. Calcified tortuous thoracic aorta. Mediastinal contours and pulmonary vascularity otherwise normal.  Fullness of the hilar regions bilaterally compatible with adenopathy as seen on CT. Bronchitic and chronic interstitial changes consistent with sarcoidosis unchanged from CT. No segmental consolidation, pleural  effusion or pneumothorax. Bones demineralized. IMPRESSION: Chronic bronchitic and interstitial lung disease changes with hilar adenopathy consistent with history of sarcoidosis. No acute abnormalities. Electronically Signed   By: Lavonia Dana M.D.   On: 09/22/2017 16:18   Ct Chest W Contrast  Result Date: 08/26/2017 CLINICAL DATA:  Hypercalcemia. Coarsened interstitial opacities on prior chest radiograph. EXAM: CT CHEST WITH CONTRAST TECHNIQUE: Multidetector CT imaging of the chest was performed during intravenous contrast administration. CONTRAST:  23m ISOVUE-300 IOPAMIDOL (ISOVUE-300) INJECTION 61% COMPARISON:  12/11/2016 chest radiograph. FINDINGS: Cardiovascular: Normal heart size. No significant pericardial fluid/thickening. Left anterior descending coronary atherosclerosis. Atherosclerotic nonaneurysmal thoracic aorta. Normal caliber pulmonary arteries. No central pulmonary emboli. Mediastinum/Nodes: No discrete thyroid nodules. Unremarkable esophagus. No axillary adenopathy. Mild right paratracheal adenopathy measuring up to 1.2 cm (series 3/image 48). Enlarged 1.5 cm subcarinal node (series 3/ image 57). Enlarged 1.2 cm AP window node (series 3/ image 51). Bilateral hilar adenopathy measuring up to the 1.1 cm on the right (series 3/ image 55) and 1.1 cm on the left (series 3/ image 51). Lungs/Pleura: No pneumothorax. No pleural effusion. There is extensive patchy nodularity throughout both lungs predominantly involving the peribronchovascular interstitium, with lesser involvement of the subpleural lungs by nodularity, most prominent in the upper lungs with relative sparing at the lung bases. Dominant solid peribronchovascular nodule measures 1.0 cm in the right middle lobe (series 4/ image 73). No significant regions of traction bronchiectasis, architectural distortion or frank honeycombing. Mild mosaic attenuation throughout both lungs. No acute consolidative airspace disease or lung masses. Upper  abdomen: The spleen is replaced by innumerable confluent small hypodense lesions. Overall normal spleen size. Mild gastrohepatic ligament adenopathy measuring up to 1.0 cm (series 3/ image 100). Mildly enlarged 1.3 cm portacaval node (series 3/ image 119). Musculoskeletal: No aggressive appearing focal osseous lesions. Mild thoracic spondylosis. Mild anterior T12 vertebral compression deformity, which appears stable back to 10/20/2016 lateral chest radiograph. IMPRESSION: 1. Extensive perilymphatic distribution nodularity throughout both lungs, upper lung predominant with relative sparing of the lung bases. Dominant 1.0 cm right middle lobe peribronchovascular pulmonary nodule. 2. Mediastinal, bilateral hilar and upper retroperitoneal lymphadenopathy, noncalcified. 3. Innumerable confluent small hypodense lesions replacing the spleen, which appears normal size. 4. This spectrum of findings is favored to represent sarcoidosis, although the differential includes a lymphoproliferative disorder. Tissue sampling of the mediastinal/hilar nodes may be considered, as clinically warranted. 5. One vessel coronary atherosclerosis. Aortic Atherosclerosis (ICD10-I70.0). Electronically Signed   By: JIlona SorrelM.D.   On: 08/26/2017 13:44     Past medical hx Past Medical History:  Diagnosis Date  . Allergy    prn claritin  . Depression   . Fibromyalgia   . Headache    hx of none since menopause  . Hx of colonic polyps serrated and adenomatous 05/28/2005  . Hyperlipidemia   . Hypothyroidism   . Ischemic chest pain   . PONV (postoperative nausea and vomiting)   . Restless leg syndrome   . Sarcoidosis    lung mass  . Thyroid disease      Social History  Substance Use Topics  . Smoking status: Never Smoker  . Smokeless tobacco: Never Used  . Alcohol use No    GraceHVeneziareports that she has never smoked. She has never used smokeless tobacco. She reports that she does not  drink alcohol or use  drugs.  Tobacco Cessation: Never smoker  Past surgical hx, Family hx, Social hx all reviewed.  Current Outpatient Prescriptions on File Prior to Visit  Medication Sig  . aspirin EC 81 MG tablet Take 81 mg by mouth at bedtime.  . Azelaic Acid 15 % cream After skin is thoroughly washed and patted dry, gently but thoroughly massage a thin film of azelaic acid cream into the affected area twice daily, in the morning and evening. (Patient taking differently: Apply 1 application topically 2 (two) times daily as needed (rosacea.). After skin is thoroughly washed and patted dry, gently but thoroughly massage a thin film of azelaic acid cream into the affected area twice daily, in the morning and evening as needed for rosacea)  . Coenzyme Q10 (COQ10) 100 MG CAPS Take 100 mg by mouth daily.   Marland Kitchen Dextrose-Fructose-Sod Citrate (NAUZENE) [phone] MG CHEW Chew 2 tablets by mouth 2 (two) times daily as needed (nausea/vomiting).  . DULoxetine (CYMBALTA) 30 MG capsule Take 60 mg by mouth daily with breakfast.   . Glucosamine-Chondroitin (COSAMIN DS PO) Take 1 tablet by mouth daily.  Marland Kitchen levothyroxine (SYNTHROID, LEVOTHROID) 50 MCG tablet TAKE 1 TABLET (50 MCG TOTAL) BY MOUTH DAILY. (Patient taking differently: TAKE 1 TABLET (50 MCG TOTAL) BY MOUTH DAILY BEFORE BREAKFAST)  . loratadine (CLARITIN) 10 MG tablet Take 10 mg by mouth at bedtime.   Marland Kitchen LORazepam (ATIVAN) 0.5 MG tablet Take 0.25-0.5 mg by mouth at bedtime as needed for anxiety (for restless leg/sleep.).  Marland Kitchen Menthol, Topical Analgesic, (STOPAIN EX) Apply 1 spray topically 3 (three) times daily as needed (for legs pain).  . methadone (DOLOPHINE) 5 MG tablet Take 7.5 mg by mouth daily at 6 PM. For restless legs  . Multiple Vitamin (MULTIVITAMIN WITH MINERALS) TABS tablet Take 1 tablet by mouth 2 (two) times daily.  . ondansetron (ZOFRAN ODT) 4 MG disintegrating tablet Take 1 tablet (4 mg total) by mouth every 8 (eight) hours as needed for nausea or vomiting.   . sennosides-docusate sodium (SENOKOT-S) 8.6-50 MG tablet Take 1 tablet by mouth 2 (two) times daily as needed for constipation.   . Wheat Dextrin (BENEFIBER PO) Take 1 Dose by mouth at bedtime.    No current facility-administered medications on file prior to visit.      Allergies  Allergen Reactions  . Phenergan [Promethazine Hcl] Other (See Comments)    Pt has restless leg syndrome and the phenergan cause the RLS  to worsen     Review Of Systems:  Constitutional:   No  weight loss, night sweats,  Fevers, chills, fatigue, or  lassitude.  HEENT:   No headaches,  Difficulty swallowing,  Tooth/dental problems, or  +Sore throat,                No sneezing, itching, ear ache, nasal congestion, post nasal drip,   CV:  No chest pain,  Orthopnea, PND, swelling in lower extremities, anasarca, dizziness, palpitations, syncope.   GI  No heartburn, indigestion, abdominal pain, nausea, vomiting, diarrhea, change in bowel habits, loss of appetite, bloody stools.   Resp: No shortness of breath with exertion or at rest.  No excess mucus, no productive cough,  + non-productive cough,  No coughing up of blood.  No change in color of mucus.  No wheezing.  No chest wall deformity  Skin: no rash or lesions.  GU: no dysuria, change in color of urine, no urgency or frequency.  No flank pain, no  hematuria   MS:  No joint pain or swelling.  No decreased range of motion.  No back pain.+ generalized pain  Psych:  No change in mood or affect. No depression or anxiety.  No memory loss.   Vital Signs BP 122/82 (BP Location: Left Arm, Cuff Size: Normal)   Pulse 82   Ht _0  (1.6 m)   Wt 164 lb 9.6 oz (74.7 kg)   SpO2 96%   BMI 29.16 kg/m    Physical Exam:  General- No distress,  A&Ox3, pleasant ENT: No sinus tenderness, TM clear, pale nasal mucosa, no oral exudate,no post nasal drip, no LAN Cardiac: S1, S2, regular rate and rhythm, no murmur Chest: No wheeze/ rales/ dullness; no accessory  muscle use, no nasal flaring, no sternal retractions, diminished per bases Abd.: Soft Non-tender, non distended, bowel sounds positive Ext: No clubbing cyanosis, edema Neuro:  normal strength Skin: No rashes, warm and dry, no lesions Psych: normal mood and behavior   Assessment/Plan  Pulmonary sarcoidosis (HCC) 09/20/2017>>EBUS guided needle aspiration of her mediastinal lymphadenopathy and transbronchial biopsies of the lung tissue  to diagnose suspected sarcoid.  Awoke 09/21/2017 with generalized body aches and sore throat, difficulty coughing forcefully. Cultures after 48 hours showed no growth AFB negative after 48 hours Plan Chest x-ray today Your CXR is normal today. We will add Mucinex 1200 mg once or twice daily for mucus thinning. Cephacol lozenges for sore throat. Z pack as directed Await final results cytology/BAL culture Start prednisone 30 mg daily 1 month, then 20 mg daily 1 month, then 10 mg daily 1 month Follow up Tuesday 09/28/2017 with Rexene Edison, NP as is scheduled to ensure you're improving. Please contact office for sooner follow up if symptoms do not improve or worsen or seek emergency care      Magdalen Spatz, NP 09/22/2017  7:58 PM

## 2017-09-22 NOTE — Telephone Encounter (Signed)
Spoke with patient. She has been scheduled with SG at 4pm. She has been advised to get a CXR before OV.

## 2017-09-22 NOTE — Telephone Encounter (Signed)
Needs CXR and acute OV

## 2017-09-22 NOTE — Telephone Encounter (Signed)
Spoke with pt, who states since her bronch on 09/20/17 she has developed upper body soreness. Pt also states she has an urge to cough but unable to do so.   BQ please advise. Thanks.

## 2017-09-23 ENCOUNTER — Telehealth: Payer: Self-pay | Admitting: Acute Care

## 2017-09-23 LAB — CULTURE, RESPIRATORY W GRAM STAIN: Culture: NO GROWTH

## 2017-09-23 MED ORDER — AZITHROMYCIN 1 G PO PACK: 1.0000 g | PACK | Freq: Once | ORAL | 0 refills | Status: AC

## 2017-09-23 NOTE — Telephone Encounter (Signed)
I have called Ms. Emmerich to let her know the results of her cytology. She states she is doing better today. She felt that she panicked yesterday.She took her first dose of prednisone this morning. I explained that I am checking with  another one of our  physicians  who performs EBUS to make sure that having a  cytology report indicating multinucleated giant cells the treatment with prednisone is appropriate.I told her I would let her know by the end of today.She verbalized understanding and had no further questions.

## 2017-09-23 NOTE — Telephone Encounter (Signed)
I have called the patient after speaking with Dr. Lamonte Sakai and she is going to continue her prednisone 30 mg daily 1 month. At that point she will drop her dosage to 20 g 1 month, and then 2 mg 1 month per Dr. Anastasia Pall original plan. She is seeing Rexene Edison, NP 09/28/2017, to ensure she is continuing to improve. Patient verbalized understanding of the above and had no further questions. She has to calls if she has any change in condition and that we will see her immediately.

## 2017-09-23 NOTE — Addendum Note (Signed)
Addended by: Georjean Mode on: 09/23/2017 08:39 AM   Modules accepted: Orders

## 2017-09-27 NOTE — Progress Notes (Signed)
Reviewed, agree 

## 2017-09-28 ENCOUNTER — Encounter: Payer: Self-pay | Admitting: Adult Health

## 2017-09-28 ENCOUNTER — Ambulatory Visit (INDEPENDENT_AMBULATORY_CARE_PROVIDER_SITE_OTHER): Payer: Medicare Other | Admitting: Adult Health

## 2017-09-28 ENCOUNTER — Telehealth: Payer: Self-pay | Admitting: Pulmonary Disease

## 2017-09-28 DIAGNOSIS — J069 Acute upper respiratory infection, unspecified: Secondary | ICD-10-CM | POA: Insufficient documentation

## 2017-09-28 DIAGNOSIS — R079 Chest pain, unspecified: Secondary | ICD-10-CM | POA: Diagnosis not present

## 2017-09-28 DIAGNOSIS — D860 Sarcoidosis of lung: Secondary | ICD-10-CM

## 2017-09-28 MED ORDER — PREDNISONE 10 MG PO TABS: ORAL_TABLET | ORAL | 0 refills | Status: DC

## 2017-09-28 NOTE — Telephone Encounter (Signed)
I called Grace Schmitt today because the BAL from her culture grew out a mold today.  She says that she feels well, perhaps improved compared to last week.  I explained to her that I recommend she continue on her course of prednisone, however should she develop a new cough, dyspnea, or fever to let me know right away.  We will monitor her BAL for growth and continue prednisone as previously arranged for now.

## 2017-09-28 NOTE — Addendum Note (Signed)
Addended by: Parke Poisson E on: 09/28/2017 02:48 PM   Modules accepted: Orders

## 2017-09-28 NOTE — Assessment & Plan Note (Signed)
Recent URI/Bronchitis - resolved with Zpack .

## 2017-09-28 NOTE — Progress Notes (Signed)
@Patient  ID: Grace Schmitt, female    DOB: 08/03/1946, 71 y.o.   MRN: [id number]  Chief Complaint  Patient presents with  . Follow-up    Sarcoid    Referring provider: Marletta Lor, MD  HPI: 71 year old female never smoker seen for pulmonary consult 09/14/2017 for possible sarcoid. Patient was found to have hypercalcemia, subsequent CT chest 08/26/2017 extensive nodularity throughout both lungs, dominant 1.0 cm right middle lobe nodule. Mediastinal bilateral hilar and upper retroperitoneal lymphadenopathy. Patient underwent EBUS and Transbronchial biopsy on 09/20/17 . Path was positive for granulomatous inflammation w/  multi nucleated giant cells. Negative for malignancy. Cultures have been negative to date  TEST  CT chest 08/26/2017 extensive nodularity throughout both lungs, dominant 1.0 cm right middle lobe nodule. Mediastinal bilateral hilar and upper retroperitoneal lymphadenopathy. EBUS and Transbronchial biopsy on 09/20/17 . Path was positive for granulomatous inflammation wasn't multi nucleated giant cells. Negative for malignancy  09/28/2017 Follow up ; Sarcoid  Patient returns for a one-week follow-up. Patient was seen in August for a pulmonary consult after workup for hypercalcemia showed abnormal CT chest with extensive nodularity through both lungs. Along with mediastinal and bilateral hilar lymphadenopathy. Patient underwent an AE bus and transbronchial biopsy on September 24 that was positive for granulomatous inflammation with multicucleated giant cells. Consistent with sarcoidosis. Patient was started on a prednisone course, beginning with  30 mg daily for 1 month, then 20 mg daily for 1 month and then 10 mg daily for 1 month.. She was having more difficulty last week  With  Patient returns today feeling cough and pleuritic pain  . Started on Zpack . She is feeling better.  Steroid pt education given .    Allergies  Allergen Reactions  . Phenergan [Promethazine  Hcl] Other (See Comments)    Pt has restless leg syndrome and the phenergan cause the RLS  to worsen     Immunization History  Administered Date(s) Administered  . Influenza Whole 09/26/2009  . Influenza, High Dose Seasonal PF 01/06/2016  . Influenza-Unspecified 08/28/2014, 10/16/2016, 08/25/2017  . PPD Test 08/31/2017  . Pneumococcal Polysaccharide-23 01/22/2011, 08/25/2017  . Tdap 05/05/2011  . Zoster 05/05/2011    Past Medical History:  Diagnosis Date  . Allergy    prn claritin  . Depression   . Fibromyalgia   . Headache    hx of none since menopause  . Hx of colonic polyps serrated and adenomatous 05/28/2005  . Hyperlipidemia   . Hypothyroidism   . Ischemic chest pain   . PONV (postoperative nausea and vomiting)   . Restless leg syndrome   . Sarcoidosis    lung mass  . Thyroid disease     Tobacco History: History  Smoking Status  . Never Smoker  Smokeless Tobacco  . Never Used   Counseling given: Not Answered   Outpatient Encounter Prescriptions as of 09/28/2017  Medication Sig  . aspirin EC 81 MG tablet Take 81 mg by mouth at bedtime.  . Azelaic Acid 15 % cream After skin is thoroughly washed and patted dry, gently but thoroughly massage a thin film of azelaic acid cream into the affected area twice daily, in the morning and evening. (Patient taking differently: Apply 1 application topically 2 (two) times daily as needed (rosacea.). After skin is thoroughly washed and patted dry, gently but thoroughly massage a thin film of azelaic acid cream into the affected area twice daily, in the morning and evening as needed for rosacea)  . Coenzyme Q10 (COQ10)  100 MG CAPS Take 100 mg by mouth daily.   Marland Kitchen Dextrose-Fructose-Sod Citrate (NAUZENE) [phone] MG CHEW Chew 2 tablets by mouth 2 (two) times daily as needed (nausea/vomiting).  . DULoxetine (CYMBALTA) 30 MG capsule Take 60 mg by mouth daily with breakfast.   . Glucosamine-Chondroitin (COSAMIN DS PO) Take 1 tablet by  mouth daily.  Marland Kitchen levothyroxine (SYNTHROID, LEVOTHROID) 50 MCG tablet TAKE 1 TABLET (50 MCG TOTAL) BY MOUTH DAILY. (Patient taking differently: TAKE 1 TABLET (50 MCG TOTAL) BY MOUTH DAILY BEFORE BREAKFAST)  . loratadine (CLARITIN) 10 MG tablet Take 10 mg by mouth at bedtime.   Marland Kitchen LORazepam (ATIVAN) 0.5 MG tablet Take 0.25-0.5 mg by mouth at bedtime as needed for anxiety (for restless leg/sleep.).  Marland Kitchen Menthol, Topical Analgesic, (STOPAIN EX) Apply 1 spray topically 3 (three) times daily as needed (for legs pain).  . methadone (DOLOPHINE) 5 MG tablet Take 7.5 mg by mouth daily at 6 PM. For restless legs  . Multiple Vitamin (MULTIVITAMIN WITH MINERALS) TABS tablet Take 1 tablet by mouth 2 (two) times daily.  . ondansetron (ZOFRAN ODT) 4 MG disintegrating tablet Take 1 tablet (4 mg total) by mouth every 8 (eight) hours as needed for nausea or vomiting.  . predniSONE (DELTASONE) 10 MG tablet Take 3 tablets (30 mg total) by mouth daily with breakfast.  . sennosides-docusate sodium (SENOKOT-S) 8.6-50 MG tablet Take 1 tablet by mouth 2 (two) times daily as needed for constipation.   . Wheat Dextrin (BENEFIBER PO) Take 1 Dose by mouth at bedtime.    No facility-administered encounter medications on file as of 09/28/2017.      Review of Systems  Constitutional:   No  weight loss, night sweats,  Fevers, chills,  +fatigue, or  lassitude.  HEENT:   No headaches,  Difficulty swallowing,  Tooth/dental problems, or  Sore throat,                No sneezing, itching, ear ache, nasal congestion, post nasal drip,   CV:  No chest pain,  Orthopnea, PND, swelling in lower extremities, anasarca, dizziness, palpitations, syncope.   GI  No heartburn, indigestion, abdominal pain, nausea, vomiting, diarrhea, change in bowel habits, loss of appetite, bloody stools.   Resp: .  No chest wall deformity  Skin: no rash or lesions.  GU: no dysuria, change in color of urine, no urgency or frequency.  No flank pain, no  hematuria   MS:  No joint pain or swelling.  No decreased range of motion.  No back pain.    Physical Exam  BP 112/68 (BP Location: Left Arm, Cuff Size: Normal)   Pulse 90   Ht 5\' 3"  (1.6 m)   Wt 163 lb 9.6 oz (74.2 kg)   SpO2 96%   BMI 28.98 kg/m   GEN: A/Ox3; pleasant , NAD, well nourished , elderly    HEENT:  /AT,  EACs-clear, TMs-wnl, NOSE-clear, THROAT-clear, no lesions, no postnasal drip or exudate noted.   NECK:  Supple w/ fair ROM; no JVD; normal carotid impulses w/o bruits; no thyromegaly or nodules palpated; no lymphadenopathy.    RESP  Clear  P & A; w/o, wheezes/ rales/ or rhonchi. no accessory muscle use, no dullness to percussion  CARD:  RRR, no m/r/g, no peripheral edema, pulses intact, no cyanosis or clubbing.  GI:   Soft & nt; nml bowel sounds; no organomegaly or masses detected.   Musco: Warm bil, no deformities or joint swelling noted.   Neuro: alert, no  focal deficits noted.    Skin: Warm, no lesions or rashes. Bruising along left dorsal hand - previous IV site. No sign redness, no streaking . + pulses and good cap refil     Lab Results:  CBC    Component Value Date/Time   WBC 7.2 08/21/2017 2225   RBC 4.13 08/21/2017 2225   HGB 12.2 08/21/2017 2225   HCT 37.5 08/21/2017 2225   PLT 301 08/21/2017 2225   MCV 90.8 08/21/2017 2225   MCH 29.5 08/21/2017 2225   MCHC 32.5 08/21/2017 2225   RDW 14.0 08/21/2017 2225   LYMPHSABS 1.6 08/13/2017 1904   MONOABS 0.9 08/13/2017 1904   EOSABS 0.4 08/13/2017 1904   BASOSABS 0.1 08/13/2017 1904    BMET    Component Value Date/Time   NA 137 08/21/2017 2225   K 4.0 08/21/2017 2225   CL 101 08/21/2017 2225   CO2 25 08/21/2017 2225   GLUCOSE 98 08/21/2017 2225   BUN 12 08/21/2017 2225   CREATININE 0.83 08/21/2017 2225   CALCIUM 10.6 (H) 08/21/2017 2225   CALCIUM 11.4 (H) 08/14/2017 0056   GFRNONAA >60 08/21/2017 2225   GFRAA >60 08/21/2017 2225    BNP No results found for: BNP  ProBNP No  results found for: PROBNP  Imaging: Dg Chest 2 View  Result Date: 09/22/2017 CLINICAL DATA:  Cough, congestion and chest tightness, lung biopsy 4 days ago, pulmonary sarcoidosis, fibromyalgia, hyperlipidemia EXAM: CHEST  2 VIEW COMPARISON:  CT chest 08/26/2017 FINDINGS: Upper normal size cardiac silhouette. Calcified tortuous thoracic aorta. Mediastinal contours and pulmonary vascularity otherwise normal. Fullness of the hilar regions bilaterally compatible with adenopathy as seen on CT. Bronchitic and chronic interstitial changes consistent with sarcoidosis unchanged from CT. No segmental consolidation, pleural effusion or pneumothorax. Bones demineralized. IMPRESSION: Chronic bronchitic and interstitial lung disease changes with hilar adenopathy consistent with history of sarcoidosis. No acute abnormalities. Electronically Signed   By: Lavonia Dana M.D.   On: 09/22/2017 16:18     Assessment & Plan:   Pulmonary sarcoidosis (HCC) PUlmonary Sarcoid -  Cont w/ steroid taper .  Steroid pt education given   Plan  Patient Instructions  Continue on Prednisone as directed : -take with food.  Prednisone 30mg  daily for 3 weeks then 20mg  daily for 1 month and then 10mg  daily for 1 month.  Follow up with Dr. Lake Bells in 6-8 weeks and As needed      URI (upper respiratory infection) Recent URI/Bronchitis - resolved with Zpack .      Rexene Edison, NP 09/28/2017

## 2017-09-28 NOTE — Assessment & Plan Note (Signed)
PUlmonary Sarcoid -  Cont w/ steroid taper .  Steroid pt education given   Plan  Patient Instructions  Continue on Prednisone as directed : -take with food.  Prednisone 30mg  daily for 3 weeks then 20mg  daily for 1 month and then 10mg  daily for 1 month.  Follow up with Dr. Lake Bells in 6-8 weeks and As needed

## 2017-09-28 NOTE — Progress Notes (Signed)
Reviewed, agree, see phone note from today

## 2017-09-28 NOTE — Patient Instructions (Signed)
Continue on Prednisone as directed : -take with food.  Prednisone 30mg  daily for 3 weeks then 20mg  daily for 1 month and then 10mg  daily for 1 month.  Follow up with Dr. Lake Bells in 6-8 weeks and As needed

## 2017-09-29 DIAGNOSIS — R413 Other amnesia: Secondary | ICD-10-CM | POA: Diagnosis not present

## 2017-10-07 ENCOUNTER — Institutional Professional Consult (permissible substitution): Payer: Medicare Other | Admitting: Emergency Medicine

## 2017-10-28 LAB — FUNGUS CULTURE RESULT

## 2017-10-28 LAB — FUNGAL ORGANISM REFLEX

## 2017-10-28 LAB — FUNGUS CULTURE WITH STAIN

## 2017-10-29 ENCOUNTER — Telehealth: Payer: Self-pay | Admitting: Pulmonary Disease

## 2017-10-29 NOTE — Telephone Encounter (Signed)
I called Grace Schmitt because the BAL showed Mucor species today, nearly 6 weeks after we performed the test.  She says that she has been feeling much better, exercising more, and has no respiratory complaints or fevers or chills.  I advised that she continue taking the prednisone as we arrange for the let us know right away if she develops any signs or symptoms of an infection like cough, mucus production or fever.  She will follow-up as previously arranged.

## 2017-11-03 ENCOUNTER — Other Ambulatory Visit: Payer: Self-pay | Admitting: Internal Medicine

## 2017-11-04 LAB — ACID FAST CULTURE WITH REFLEXED SENSITIVITIES (MYCOBACTERIA): Acid Fast Culture: NEGATIVE

## 2017-11-23 DIAGNOSIS — D649 Anemia, unspecified: Secondary | ICD-10-CM | POA: Diagnosis not present

## 2017-11-23 DIAGNOSIS — M8580 Other specified disorders of bone density and structure, unspecified site: Secondary | ICD-10-CM | POA: Diagnosis not present

## 2017-11-23 DIAGNOSIS — J8410 Pulmonary fibrosis, unspecified: Secondary | ICD-10-CM | POA: Diagnosis not present

## 2017-11-24 ENCOUNTER — Encounter: Payer: Self-pay | Admitting: Internal Medicine

## 2017-11-24 ENCOUNTER — Ambulatory Visit: Payer: Medicare Other | Admitting: Internal Medicine

## 2017-11-24 VITALS — BP 122/78 | HR 81 | Temp 98.6°F | Ht 63.0 in | Wt 177.8 lb

## 2017-11-24 DIAGNOSIS — D71 Functional disorders of polymorphonuclear neutrophils: Secondary | ICD-10-CM

## 2017-11-24 DIAGNOSIS — D860 Sarcoidosis of lung: Secondary | ICD-10-CM

## 2017-11-24 DIAGNOSIS — G2581 Restless legs syndrome: Secondary | ICD-10-CM

## 2017-11-24 MED ORDER — NYSTATIN 100000 UNIT/ML MT SUSP: 5.0000 mL | Freq: Four times a day (QID) | OROMUCOSAL | 0 refills | Status: DC

## 2017-11-24 NOTE — Patient Instructions (Addendum)
Pulmonary follow-up as scheduled  Endocrinology follow-up as scheduled

## 2017-11-25 ENCOUNTER — Encounter: Payer: Self-pay | Admitting: Pulmonary Disease

## 2017-11-25 ENCOUNTER — Ambulatory Visit: Payer: Medicare Other | Admitting: Pulmonary Disease

## 2017-11-25 ENCOUNTER — Encounter: Payer: Self-pay | Admitting: Internal Medicine

## 2017-11-25 VITALS — BP 118/74 | HR 84 | Ht 62.0 in | Wt 177.4 lb

## 2017-11-25 DIAGNOSIS — D860 Sarcoidosis of lung: Secondary | ICD-10-CM | POA: Diagnosis not present

## 2017-11-25 NOTE — Progress Notes (Signed)
Subjective:    Patient ID: Grace Schmitt, female    DOB: 1946/10/10, 71 y.o.   MRN: [id number]  HPI  71 year old patient who is seen today for follow-up. She has been hospitalized earlier due to mental status changes secondary to hypercalcemia.  This is secondary to sarcoidosis.  She is scheduled for endocrinology follow-up soon as well as pulmonary follow-up.  She remains on modest dose prednisone therapy. She has a history of restless leg syndrome which has been a bit bothersome.  Otherwise doing well. She has had some mouth discomfort and she is concerned about possible thrush in view of her prednisone therapy   Past Medical History:  Diagnosis Date  . Allergy    prn claritin  . Depression   . Fibromyalgia   . Headache    hx of none since menopause  . Hx of colonic polyps serrated and adenomatous 05/28/2005  . Hyperlipidemia   . Hypothyroidism   . Ischemic chest pain   . PONV (postoperative nausea and vomiting)   . Restless leg syndrome   . Sarcoidosis    lung mass  . Thyroid disease      Social History   Socioeconomic History  . Marital status: Married    Spouse name: Not on file  . Number of children: Not on file  . Years of education: Not on file  . Highest education level: Not on file  Social Needs  . Financial resource strain: Not on file  . Food insecurity - worry: Not on file  . Food insecurity - inability: Not on file  . Transportation needs - medical: Not on file  . Transportation needs - non-medical: Not on file  Occupational History  . Not on file  Tobacco Use  . Smoking status: Never Smoker  . Smokeless tobacco: Never Used  Substance and Sexual Activity  . Alcohol use: No    Alcohol/week: 0.0 oz  . Drug use: No  . Sexual activity: Not Currently  Other Topics Concern  . Not on file  Social History Narrative  . Not on file    Past Surgical History:  Procedure Laterality Date  . COLONOSCOPY  October 2011  . ENDOBRONCHIAL ULTRASOUND  Bilateral 09/20/2017   Procedure: ENDOBRONCHIAL ULTRASOUND;  Surgeon: Juanito Doom, MD;  Location: [facility];  Service: Cardiopulmonary;  Laterality: Bilateral;  . TONSILLECTOMY  1951  . TONSILLECTOMY    . TUBAL LIGATION  1978   x2    Family History  Problem Relation Age of Onset  . Dementia Mother   . Heart disease Mother        smal vessel disease  . Hypertension Brother   . Colon cancer Neg Hx   . Colon polyps Neg Hx   . Breast cancer Neg Hx     Allergies  Allergen Reactions  . Phenergan [Promethazine Hcl] Other (See Comments)    Pt has restless leg syndrome and the phenergan cause the RLS  to worsen     Current Outpatient Medications on File Prior to Visit  Medication Sig Dispense Refill  . aspirin EC 81 MG tablet Take 81 mg by mouth at bedtime.    . Azelaic Acid 15 % cream After skin is thoroughly washed and patted dry, gently but thoroughly massage a thin film of azelaic acid cream into the affected area twice daily, in the morning and evening. (Patient taking differently: Apply 1 application topically 2 (two) times daily as needed (rosacea.). After skin is thoroughly washed and patted  dry, gently but thoroughly massage a thin film of azelaic acid cream into the affected area twice daily, in the morning and evening as needed for rosacea) 50 g 4  . Coenzyme Q10 (COQ10) 100 MG CAPS Take 100 mg by mouth daily.     Marland Kitchen Dextrose-Fructose-Sod Citrate (NAUZENE) [phone] MG CHEW Chew 2 tablets by mouth 2 (two) times daily as needed (nausea/vomiting).    . Glucosamine-Chondroitin (COSAMIN DS PO) Take 1 tablet by mouth daily.    Marland Kitchen levothyroxine (SYNTHROID, LEVOTHROID) 50 MCG tablet TAKE 1 TABLET (50 MCG TOTAL) BY MOUTH DAILY. 90 tablet 2  . loratadine (CLARITIN) 10 MG tablet Take 10 mg by mouth at bedtime.     Marland Kitchen LORazepam (ATIVAN) 0.5 MG tablet Take 0.25-0.5 mg by mouth at bedtime as needed for anxiety (for restless leg/sleep.).    Marland Kitchen Menthol, Topical Analgesic, (STOPAIN EX)  Apply 1 spray topically 3 (three) times daily as needed (for legs pain).    . methadone (DOLOPHINE) 5 MG tablet Take 7.5 mg by mouth daily at 6 PM. For restless legs    . Multiple Vitamin (MULTIVITAMIN WITH MINERALS) TABS tablet Take 1 tablet by mouth 2 (two) times daily.    . ondansetron (ZOFRAN ODT) 4 MG disintegrating tablet Take 1 tablet (4 mg total) by mouth every 8 (eight) hours as needed for nausea or vomiting. 24 tablet 1  . predniSONE (DELTASONE) 10 MG tablet 30mg  x3 weeks, then 20mg  x1 month, then 10mg  x1 month 160 tablet 0  . sennosides-docusate sodium (SENOKOT-S) 8.6-50 MG tablet Take 1 tablet by mouth 2 (two) times daily as needed for constipation.     . Wheat Dextrin (BENEFIBER PO) Take 1 Dose by mouth at bedtime.     . DULoxetine (CYMBALTA) 30 MG capsule Take 60 mg by mouth daily with breakfast.      No current facility-administered medications on file prior to visit.     BP 122/78 (BP Location: Left Arm, Patient Position: Sitting, Cuff Size: Normal)   Pulse 81   Temp 98.6 F (37 C) (Oral)   Ht 5\' 3"  (1.6 m)   Wt 177 lb 12.8 oz (80.6 kg)   SpO2 96%   BMI 31.50 kg/m    Review of Systems  Constitutional: Negative.   HENT: Negative for congestion, dental problem, hearing loss, rhinorrhea, sinus pressure, sore throat and tinnitus.   Eyes: Negative for pain, discharge and visual disturbance.  Respiratory: Negative for cough and shortness of breath.   Cardiovascular: Negative for chest pain, palpitations and leg swelling.  Gastrointestinal: Negative for abdominal distention, abdominal pain, blood in stool, constipation, diarrhea, nausea and vomiting.  Genitourinary: Negative for difficulty urinating, dysuria, flank pain, frequency, hematuria, pelvic pain, urgency, vaginal bleeding, vaginal discharge and vaginal pain.  Musculoskeletal: Negative for arthralgias, gait problem and joint swelling.  Skin: Negative for rash.  Neurological: Negative for dizziness, syncope, speech  difficulty, weakness, numbness and headaches.  Hematological: Negative for adenopathy.  Psychiatric/Behavioral: Negative for agitation, behavioral problems and dysphoric mood. The patient is not nervous/anxious.        Objective:   Physical Exam  Constitutional: She is oriented to person, place, and time. She appears well-developed and well-nourished.  HENT:  Head: Normocephalic.  Right Ear: External ear normal.  Left Ear: External ear normal.  Minimal erythema of the oropharynx and tongue but no exudative lesions  Eyes: Conjunctivae and EOM are normal. Pupils are equal, round, and reactive to light.  Neck: Normal range of motion. Neck supple.  No thyromegaly present.  Cardiovascular: Normal rate, regular rhythm, normal heart sounds and intact distal pulses.  Pulmonary/Chest: Effort normal and breath sounds normal.  Abdominal: Soft. Bowel sounds are normal. She exhibits no mass. There is no tenderness.  Musculoskeletal: Normal range of motion.  Lymphadenopathy:    She has no cervical adenopathy.  Neurological: She is alert and oriented to person, place, and time.  Skin: Skin is warm and dry. No rash noted.  Psychiatric: She has a normal mood and affect. Her behavior is normal.          Assessment & Plan:   Hypercalcemia secondary to granulomatous disease.  Endocrine and pulmonary follow-up Possible early thrush.  Patient is scheduled to see pulmonary tomorrow;  we will give a prescription for Mycostatin oral suspension but will observe without treatment for at least the next 24 hours  Endocrine follow-up as scheduled Return here in 6 months or as needed  [employer]

## 2017-11-25 NOTE — Progress Notes (Signed)
Subjective:    Patient ID: Grace Schmitt, female    DOB: 07-30-1946, 71 y.o.   MRN: [id number]  Synopsis: Sarcoidosis. Referred in the fall 2018 for evaluation of mediastinal lymphadenopathy discovered in the midst of symptomatic hypercalcemia and some pulmonary nodules.  Underwent an endobronchial ultrasound-guided needle aspiration of mediastinal lymphadenopathy and found to have noncaseating granulomas.  HPI Chief Complaint  Patient presents with  . Follow-up    Blood work has been normal last two times within last few weeks with Dr. Buddy Duty MD. Pt would like to know what to do from here with the predinsone   Sarcoidosis: > currently taking prendisone 10gm daily > she says that it makes her feel a little jittery > she has a lot of hot flashes, she wonders if this is from her prednisone > she has been experiencing excessive sweats recently > she has been walking 2-4 miles 5 days a week > she has gained 10 pounds > no more problems with feeling like her thinking is abnormal > no breathing difficulty right now    Past Medical History:  Diagnosis Date  . Allergy    prn claritin  . Depression   . Fibromyalgia   . Headache    hx of none since menopause  . Hx of colonic polyps serrated and adenomatous 05/28/2005  . Hyperlipidemia   . Hypothyroidism   . Ischemic chest pain   . PONV (postoperative nausea and vomiting)   . Restless leg syndrome   . Sarcoidosis    lung mass  . Thyroid disease         Review of Systems  Constitutional: Negative for fever and unexpected weight change.  HENT: Negative for nosebleeds, postnasal drip, rhinorrhea, sinus pressure, sneezing and sore throat.   Respiratory: Negative for cough, chest tightness, shortness of breath and wheezing.   Cardiovascular: Negative for palpitations and leg swelling.  Gastrointestinal: Negative for vomiting.       Objective:   Physical Exam Vitals:   11/25/17 1501  BP: 118/74  Pulse: 84  SpO2: 96%    Weight: 177 lb 6.4 oz (80.5 kg)  Height: _0  (1.575 m)   RA  Gen: well appearing HENT: OP clear, TM's clear, neck supple PULM: Few crackles bases B, normal percussion CV: RRR, no mgr, trace edema GI: BS+, soft, nontender Derm: no cyanosis or rash Psyche: normal mood and affect     CBC    Component Value Date/Time   WBC 7.2 08/21/2017 2225   RBC 4.13 08/21/2017 2225   HGB 12.2 08/21/2017 2225   HCT 37.5 08/21/2017 2225   PLT 301 08/21/2017 2225   MCV 90.8 08/21/2017 2225   MCH 29.5 08/21/2017 2225   MCHC 32.5 08/21/2017 2225   RDW 14.0 08/21/2017 2225   LYMPHSABS 1.6 08/13/2017 1904   MONOABS 0.9 08/13/2017 1904   EOSABS 0.4 08/13/2017 1904   BASOSABS 0.1 08/13/2017 1904   BMET    Component Value Date/Time   NA 137 08/21/2017 2225   K 4.0 08/21/2017 2225   CL 101 08/21/2017 2225   CO2 25 08/21/2017 2225   GLUCOSE 98 08/21/2017 2225   BUN 12 08/21/2017 2225   CREATININE 0.83 08/21/2017 2225   CALCIUM 10.6 (H) 08/21/2017 2225   CALCIUM 11.4 (H) 08/14/2017 0056   GFRNONAA >60 08/21/2017 2225   GFRAA >60 08/21/2017 2225   Other imaging: MRI brain August 2018 showed no evidence of a stroke  Chest imaging: August 2018 CT chest images independently  reviewed showing upper lobe predominant bronchovascular distributed pulmonary nodules, there is one predominant nodule in the right middle lobe which is about 1.1 cm in size. There is also significant mediastinal lymphadenopathy.  Echocardiogram: August 2018 transthoracic echocardiogram showing normal LVEF, grade 1 diastolic dysfunction  Micro: September 2018 BAL: Mucor species  Pathology: September 22, 2017 granulomatous inflammation on endobronchial biopsy and lymph node needle aspiration  Hospitalization records from August 2018 reviewed with the patient was noted to have hypercalcemia treated with IV fluids. Total parathyroid value was 11 pg per mL (low), PTH related peptide was negative, 1 25-hydroxy vitamin  D normal     Assessment & Plan:  Pulmonary sarcoidosis ([place]) - Plan: Pulmonary function test  Hypercalcemia  Discussion: Rhea was diagnosed with pulmonary sarcoidosis which causes hypercalcemia based on an endobronchial and lymph node needle aspiration in September 2018.  Since starting on prednisone she has done well.  She did grow Mucor species from her BAL but she had no evidence of a mold infection on either tissue biopsy.  Nor did she have any sort of abnormal pulmonary parenchymal abnormalities to suggest an active infection or clinical symptoms which would suggest this either.  So it is most likely that the Mucor species is a contaminant.  We will plan on finishing a course of prednisone in 3 weeks.  We will use lung function testing, her mental status, and her calcium value as a barometer for disease activity.  This was discussed at length with the patient and her husband today.  Plan: Sarcoidosis: Continue prednisone for another 3 weeks then stop We will arrange for a lung function test When you return we will check a blood test to check your serum calcium value again We will see you back in 5 weeks or sooner if needed  > 50% of this 26 minute visit spent face to face    Current Outpatient Medications:  .  aspirin EC 81 MG tablet, Take 81 mg by mouth at bedtime., Disp: , Rfl:  .  Azelaic Acid 15 % cream, After skin is thoroughly washed and patted dry, gently but thoroughly massage a thin film of azelaic acid cream into the affected area twice daily, in the morning and evening. (Patient taking differently: Apply 1 application topically 2 (two) times daily as needed (rosacea.). After skin is thoroughly washed and patted dry, gently but thoroughly massage a thin film of azelaic acid cream into the affected area twice daily, in the morning and evening as needed for rosacea), Disp: 50 g, Rfl: 4 .  Coenzyme Q10 (COQ10) 100 MG CAPS, Take 100 mg by mouth daily. , Disp: , Rfl:  .   Dextrose-Fructose-Sod Citrate (NAUZENE) [id number] MG CHEW, Chew 2 tablets by mouth 2 (two) times daily as needed (nausea/vomiting)., Disp: , Rfl:  .  Glucosamine-Chondroitin (COSAMIN DS PO), Take 1 tablet by mouth daily., Disp: , Rfl:  .  levothyroxine (SYNTHROID, LEVOTHROID) 50 MCG tablet, TAKE 1 TABLET (50 MCG TOTAL) BY MOUTH DAILY., Disp: 90 tablet, Rfl: 2 .  loratadine (CLARITIN) 10 MG tablet, Take 10 mg by mouth at bedtime. , Disp: , Rfl:  .  LORazepam (ATIVAN) 0.5 MG tablet, Take 0.25-0.5 mg by mouth at bedtime as needed for anxiety (for restless leg/sleep.)., Disp: , Rfl:  .  Menthol, Topical Analgesic, (STOPAIN EX), Apply 1 spray topically 3 (three) times daily as needed (for legs pain)., Disp: , Rfl:  .  methadone (DOLOPHINE) 5 MG tablet, Take 7.5 mg by mouth daily at  6 PM. For restless legs, Disp: , Rfl:  .  Multiple Vitamin (MULTIVITAMIN WITH MINERALS) TABS tablet, Take 1 tablet by mouth 2 (two) times daily., Disp: , Rfl:  .  nystatin (MYCOSTATIN) 100000 UNIT/ML suspension, Take 5 mLs (500,000 Units total) by mouth 4 (four) times daily., Disp: 60 mL, Rfl: 0 .  ondansetron (ZOFRAN ODT) 4 MG disintegrating tablet, Take 1 tablet (4 mg total) by mouth every 8 (eight) hours as needed for nausea or vomiting., Disp: 24 tablet, Rfl: 1 .  predniSONE (DELTASONE) 10 MG tablet, 61m x3 weeks, then 247mx1 month, then 1029m1 month, Disp: 160 tablet, Rfl: 0 .  sennosides-docusate sodium (SENOKOT-S) 8.6-50 MG tablet, Take 1 tablet by mouth 2 (two) times daily as needed for constipation. , Disp: , Rfl:  .  Wheat Dextrin (BENEFIBER PO), Take 1 Dose by mouth at bedtime. , Disp: , Rfl:  .  DULoxetine (CYMBALTA) 30 MG capsule, Take 60 mg by mouth daily with breakfast. , Disp: , Rfl:

## 2017-11-25 NOTE — Patient Instructions (Signed)
Sarcoidosis: Continue prednisone for another 3 weeks then stop We will arrange for a lung function test When you return we will check a blood test to check your serum calcium value again We will see you back in 5 weeks or sooner if needed

## 2017-11-29 ENCOUNTER — Telehealth: Payer: Self-pay | Admitting: *Deleted

## 2017-11-29 NOTE — Telephone Encounter (Signed)
Okay for urinalysis and BMET

## 2017-11-29 NOTE — Telephone Encounter (Signed)
Copied from [place]. Topic: General - Other >> Nov 29, 2017  9:38 AM Patrice Paradise wrote: Reason for CRM: Patient wanted to let Dr. Sharren Bridge to know that her Macie Burows has increased over the weekend. Pt stated that she went to the bathroom 20 times on Saturday. Pt would like to have a lab work done to check her potassium check because she been having a problem with her potassium.

## 2017-11-29 NOTE — Telephone Encounter (Signed)
Sir please see below message. Please advise. 

## 2017-11-30 ENCOUNTER — Other Ambulatory Visit: Payer: Self-pay | Admitting: Internal Medicine

## 2017-11-30 DIAGNOSIS — Z01419 Encounter for gynecological examination (general) (routine) without abnormal findings: Secondary | ICD-10-CM | POA: Diagnosis not present

## 2017-11-30 DIAGNOSIS — R3589 Other polyuria: Secondary | ICD-10-CM

## 2017-11-30 DIAGNOSIS — R358 Other polyuria: Secondary | ICD-10-CM

## 2017-11-30 NOTE — Telephone Encounter (Signed)
Pt called and lab appt made for 12/01/17.

## 2017-12-01 ENCOUNTER — Other Ambulatory Visit (INDEPENDENT_AMBULATORY_CARE_PROVIDER_SITE_OTHER): Payer: Medicare Other

## 2017-12-01 DIAGNOSIS — R358 Other polyuria: Secondary | ICD-10-CM | POA: Diagnosis not present

## 2017-12-01 DIAGNOSIS — R3589 Other polyuria: Secondary | ICD-10-CM

## 2017-12-01 LAB — BASIC METABOLIC PANEL
BUN: 21 mg/dL (ref 6–23)
CO2: 31 mEq/L (ref 19–32)
Calcium: 9.3 mg/dL (ref 8.4–10.5)
Chloride: 100 mEq/L (ref 96–112)
Creatinine, Ser: 0.75 mg/dL (ref 0.40–1.20)
GFR: 80.61 mL/min (ref >60.00)
Glucose, Bld: 106 mg/dL — ABNORMAL HIGH (ref 70–99)
Potassium: 4.6 mEq/L (ref 3.5–5.1)
Sodium: 139 mEq/L (ref 135–145)

## 2017-12-01 LAB — POC URINALSYSI DIPSTICK (AUTOMATED)
Bilirubin, UA: NEGATIVE
Blood, UA: NEGATIVE
Glucose, UA: NEGATIVE
Ketones, UA: NEGATIVE
Leukocytes, UA: NEGATIVE
Nitrite, UA: NEGATIVE
Protein, UA: NEGATIVE
Spec Grav, UA: 1.015 (ref 1.010–1.025)
Urobilinogen, UA: 0.2 E.U./dL
pH, UA: 7.5 (ref 5.0–8.0)

## 2018-01-06 ENCOUNTER — Telehealth: Payer: Self-pay | Admitting: Pulmonary Disease

## 2018-01-06 DIAGNOSIS — R0781 Pleurodynia: Secondary | ICD-10-CM

## 2018-01-06 NOTE — Telephone Encounter (Signed)
Spoke with pt to relay below recommendations.  Pt was offered apt with MW tomorrow for acute visit. Pt wanted to know if she could just keep her upcoming apt for 01/11/18.  Per BQ verbally- okay to keep scheduled apt and take 10mg  prednisone until ROV. Pt stated she did have enough prednisone 10mg  on hand and no Rx was needed.  I have made pt aware to go to ED or UC if symptoms do worsen over the weekend.  CXR has been ordered. Pt states she will arrive at CXR department at 8:30 on 01/11/18 before OV with BQ. Nothing further is needed.

## 2018-01-06 NOTE — Telephone Encounter (Signed)
Called spoke with patient who reports she finished her prednisone on 12.25.18 but this past week she began experiencing severe joint and rib pain - rates pain at a 5-6 right now, and and 8-9 at the most severe.  She is unsure if this is related to her sarcoid and/or finishing the prednisone Does have a little bit of a hacky cough, but not anything productive or note worthy per pt   Pain started out in ribcage on the left, under her breast - wrapped all the way around and since then the focal point of the pain has moved to her shoulder but still lingers in the rib cage.  Dr Lake Bells please advise, thank you.  Per 11.29.18 ov w/ BQ: Patient Instructions  Sarcoidosis: Continue prednisone for another 3 weeks then stop We will arrange for a lung function test When you return we will check a blood test to check your serum calcium value again We will see you back in 5 weeks or sooner if needed

## 2018-01-06 NOTE — Telephone Encounter (Signed)
Needs OV and CXR

## 2018-01-11 ENCOUNTER — Ambulatory Visit: Payer: PPO | Admitting: Pulmonary Disease

## 2018-01-11 ENCOUNTER — Ambulatory Visit (INDEPENDENT_AMBULATORY_CARE_PROVIDER_SITE_OTHER): Payer: PPO | Admitting: Pulmonary Disease

## 2018-01-11 ENCOUNTER — Other Ambulatory Visit (INDEPENDENT_AMBULATORY_CARE_PROVIDER_SITE_OTHER): Payer: PPO

## 2018-01-11 ENCOUNTER — Encounter: Payer: Self-pay | Admitting: Pulmonary Disease

## 2018-01-11 ENCOUNTER — Telehealth: Payer: Self-pay | Admitting: Pulmonary Disease

## 2018-01-11 ENCOUNTER — Ambulatory Visit (INDEPENDENT_AMBULATORY_CARE_PROVIDER_SITE_OTHER)
Admission: RE | Admit: 2018-01-11 | Discharge: 2018-01-11 | Disposition: A | Discharge status: Home / Self Care | Payer: Self-pay | Source: Ambulatory Visit | Attending: Pulmonary Disease | Admitting: Pulmonary Disease

## 2018-01-11 VITALS — BP 138/76 | HR 75 | Ht 62.75 in | Wt 178.0 lb

## 2018-01-11 DIAGNOSIS — D860 Sarcoidosis of lung: Secondary | ICD-10-CM

## 2018-01-11 DIAGNOSIS — D869 Sarcoidosis, unspecified: Secondary | ICD-10-CM

## 2018-01-11 DIAGNOSIS — G2581 Restless legs syndrome: Secondary | ICD-10-CM | POA: Diagnosis not present

## 2018-01-11 DIAGNOSIS — R05 Cough: Secondary | ICD-10-CM | POA: Diagnosis not present

## 2018-01-11 DIAGNOSIS — R0781 Pleurodynia: Secondary | ICD-10-CM

## 2018-01-11 DIAGNOSIS — R079 Chest pain, unspecified: Secondary | ICD-10-CM | POA: Diagnosis not present

## 2018-01-11 LAB — CBC WITH DIFFERENTIAL/PLATELET
Basophils Absolute: 0.1 10*3/uL (ref 0.0–0.1)
Basophils Relative: 0.7 % (ref 0.0–3.0)
Eosinophils Absolute: 0 10*3/uL (ref 0.0–0.7)
Eosinophils Relative: 0.3 % (ref 0.0–5.0)
HCT: 41.9 % (ref 36.0–46.0)
Hemoglobin: 13.7 g/dL (ref 12.0–15.0)
Lymphocytes Relative: 10.6 % — ABNORMAL LOW (ref 12.0–46.0)
Lymphs Abs: 1 10*3/uL (ref 0.7–4.0)
MCHC: 32.6 g/dL (ref 30.0–36.0)
MCV: 94 fl (ref 78.0–100.0)
Monocytes Absolute: 0.2 10*3/uL (ref 0.1–1.0)
Monocytes Relative: 2.7 % — ABNORMAL LOW (ref 3.0–12.0)
Neutro Abs: 7.8 10*3/uL — ABNORMAL HIGH (ref 1.4–7.7)
Neutrophils Relative %: 85.7 % — ABNORMAL HIGH (ref 43.0–77.0)
Platelets: 320 10*3/uL (ref 150.0–400.0)
RBC: 4.46 Mil/uL (ref 3.87–5.11)
RDW: 13.3 % (ref 11.5–15.5)
WBC: 9.1 10*3/uL (ref 4.0–10.5)

## 2018-01-11 LAB — PULMONARY FUNCTION TEST
DL/VA % pred: 101 %
DL/VA: 4.69 ml/min/mmHg/L
DLCO cor % pred: 75 %
DLCO cor: 16.98 ml/min/mmHg
DLCO unc % pred: 73 %
DLCO unc: 16.54 ml/min/mmHg
FEF 25-75 Post: 2.16 L/sec
FEF 25-75 Pre: 1.99 L/sec
FEF2575-%Change-Post: 8 %
FEF2575-%Pred-Post: 126 %
FEF2575-%Pred-Pre: 116 %
FEV1-%Change-Post: 0 %
FEV1-%Pred-Post: 83 %
FEV1-%Pred-Pre: 83 %
FEV1-Post: 1.74 L
FEV1-Pre: 1.74 L
FEV1FVC-%Change-Post: 4 %
FEV1FVC-%Pred-Pre: 111 %
FEV6-%Change-Post: -4 %
FEV6-%Pred-Post: 75 %
FEV6-%Pred-Pre: 79 %
FEV6-Post: 1.97 L
FEV6-Pre: 2.07 L
FEV6FVC-%Pred-Post: 105 %
FEV6FVC-%Pred-Pre: 105 %
FVC-%Change-Post: -4 %
FVC-%Pred-Post: 71 %
FVC-%Pred-Pre: 75 %
FVC-Post: 1.97 L
FVC-Pre: 2.07 L
Post FEV1/FVC ratio: 88 %
Post FEV6/FVC ratio: 100 %
Pre FEV1/FVC ratio: 84 %
Pre FEV6/FVC Ratio: 100 %
RV % pred: 74 %
RV: 1.63 L
TLC % pred: 77 %
TLC: 3.76 L

## 2018-01-11 LAB — BASIC METABOLIC PANEL
BUN: 25 mg/dL — ABNORMAL HIGH (ref 6–23)
CO2: 28 mEq/L (ref 19–32)
Calcium: 9.4 mg/dL (ref 8.4–10.5)
Chloride: 101 mEq/L (ref 96–112)
Creatinine, Ser: 0.7 mg/dL (ref 0.40–1.20)
GFR: 87.26 mL/min (ref >60.00)
Glucose, Bld: 128 mg/dL — ABNORMAL HIGH (ref 70–99)
Potassium: 4.1 mEq/L (ref 3.5–5.1)
Sodium: 139 mEq/L (ref 135–145)

## 2018-01-11 LAB — FERRITIN: Ferritin: 39 ng/mL (ref 10.0–291.0)

## 2018-01-11 MED ORDER — PREDNISONE 10 MG PO TABS: 10.0000 mg | ORAL_TABLET | Freq: Every day | ORAL | 0 refills | Status: DC

## 2018-01-11 NOTE — Progress Notes (Signed)
PFT done today. 

## 2018-01-11 NOTE — Telephone Encounter (Signed)
New lab ordered.  Lab aware.  Nothing further needed.

## 2018-01-11 NOTE — Patient Instructions (Signed)
For chest pain with a known history of sarcoidosis: I am hopeful that this will just be something like a cracked rib, however I do worry about recurrence of sarcoid Because of your known diagnosis of sarcoid were going to do the following: Check basic metabolic panel, CT scan of the chest Continue prednisone low-dose for now  Because of the contamination from Mucor species on the bronchoscopy: Check lab work to see if there is evidence of a fungal infection: Serum galactomannan and beta D glucan testing  But for restless leg syndrome which is worse: Check serum ferritin level  We will have you follow-up with a nurse practitioner in about 2 weeks.  If all testing suggest that sarcoid is recurrent then we will need to start methotrexate at that point.

## 2018-01-11 NOTE — Progress Notes (Signed)
Subjective:    Patient ID: Grace Schmitt, female    DOB: 1946/09/18, 72 y.o.   MRN: [id number]  Synopsis: Sarcoidosis. Referred in the fall 2018 for evaluation of mediastinal lymphadenopathy discovered in the midst of symptomatic hypercalcemia and some pulmonary nodules.  Underwent an endobronchial ultrasound-guided needle aspiration of mediastinal lymphadenopathy and found to have noncaseating granulomas.  HPI Chief Complaint  Patient presents with  . Follow-up    review PFT.  Pt states she is doing well, notes L sided anterior chest pain worsening with cough/sneezing. Prednisone has helped with pain.    [place] starteed having a left chest pain that would hurt with sneezing.  She has had poor quality sleep from restless legs which she says preceded the original hospitalization.  Otherwise she doesn't have fever or chills, or dyspnea.   She doesn't think that she is bleeding.  We called in prednisone and she says this has helped some.  She is nervous that the sarcoidosis is coming back. Her chest pain started back last week. No preceding cough, cold, fevers chills.       Past Medical History:  Diagnosis Date  . Allergy    prn claritin  . Depression   . Fibromyalgia   . Headache    hx of none since menopause  . Hx of colonic polyps serrated and adenomatous 05/28/2005  . Hyperlipidemia   . Hypothyroidism   . Ischemic chest pain   . PONV (postoperative nausea and vomiting)   . Restless leg syndrome   . Sarcoidosis    lung mass  . Thyroid disease         Review of Systems  Constitutional: Negative for fever and unexpected weight change.  HENT: Negative for nosebleeds, postnasal drip, rhinorrhea, sinus pressure, sneezing and sore throat.   Respiratory: Negative for cough, chest tightness, shortness of breath and wheezing.   Cardiovascular: Negative for palpitations and leg swelling.  Gastrointestinal: Negative for vomiting.       Objective:   Physical Exam Vitals:   01/11/18 0955  BP: 138/76  Pulse: 75  SpO2: 98%  Weight: 178 lb (80.7 kg)  Height: 5' 2.75" (1.594 m)   RA  Gen: well appearing HENT: OP clear, TM's clear, neck supple PULM: Crackles left base more than right B, normal percussion CV: RRR, no mgr, trace edema GI: BS+, soft, nontender Derm: no cyanosis or rash Psyche: normal mood and affect    CBC    Component Value Date/Time   WBC 7.2 08/21/2017 2225   RBC 4.13 08/21/2017 2225   HGB 12.2 08/21/2017 2225   HCT 37.5 08/21/2017 2225   PLT 301 08/21/2017 2225   MCV 90.8 08/21/2017 2225   MCH 29.5 08/21/2017 2225   MCHC 32.5 08/21/2017 2225   RDW 14.0 08/21/2017 2225   LYMPHSABS 1.6 08/13/2017 1904   MONOABS 0.9 08/13/2017 1904   EOSABS 0.4 08/13/2017 1904   BASOSABS 0.1 08/13/2017 1904   BMET    Component Value Date/Time   NA 139 12/01/2017 1007   K 4.6 12/01/2017 1007   CL 100 12/01/2017 1007   CO2 31 12/01/2017 1007   GLUCOSE 106 (H) 12/01/2017 1007   BUN 21 12/01/2017 1007   CREATININE 0.75 12/01/2017 1007   CALCIUM 9.3 12/01/2017 1007   CALCIUM 11.4 (H) 08/14/2017 0056   GFRNONAA >60 08/21/2017 2225   GFRAA >60 08/21/2017 2225   Other imaging: MRI brain August 2018 showed no evidence of a stroke  Chest imaging: August 2018  CT chest images independently reviewed showing upper lobe predominant bronchovascular distributed pulmonary nodules, there is one predominant nodule in the right middle lobe which is about 1.1 cm in size. There is also significant mediastinal lymphadenopathy.  Echocardiogram: August 2018 transthoracic echocardiogram showing normal LVEF, grade 1 diastolic dysfunction  Pulmonary function testing: January 2019 ratio normal, FEV1 1.74 L, FVC 1.97 L 71% predicted, total lung capacity 3.76 L 77% predicted, DLCO 16.54 mL 73% predicted  Micro: September 2018 BAL: Mucor species  Pathology: September 22, 2017 granulomatous inflammation on endobronchial biopsy and lymph node needle  aspiration  Hospitalization records from August 2018 reviewed with the patient was noted to have hypercalcemia treated with IV fluids. Total parathyroid value was 11 pg per mL (low), PTH related peptide was negative, 1 25-hydroxy vitamin D normal     Assessment & Plan:  Pulmonary sarcoidosis ([place]) - Plan: CBC w/Diff, Basic Metabolic Panel (BMET), Aspergillus galactomannan antigen, Fungitell, Serum, Aspergillus Ag, BAL/Serum  Restless leg syndrome - Plan: Ferritin  Sarcoidosis - Plan: CT Chest Wo Contrast  Discussion: Grace Schmitt returns with chest pain and worsening of her restless leg syndrome.  I am hopeful that the chest pain is just due to something like a cracked rib from sneezing and coughing she had a few weeks ago.  However, she could have recurrent sarcoid after completing her prednisone treatment.  Worsening of her restless legs with somewhat of a harbinger for her sarcoid presentation previously.  Because of the Mucor which grew out from her BAL (again, I think this was a contaminant) I like to do a bit more investigation prior to starting a longer course of treatment for sarcoid.  Plan:  For chest pain with a known history of sarcoidosis: I am hopeful that this will just be something like a cracked rib, however I do worry about recurrence of sarcoid Because of your known diagnosis of sarcoid were going to do the following: Check basic metabolic panel, CT scan of the chest Continue prednisone low-dose for now  Because of the contamination from Mucor species on the bronchoscopy: Check lab work to see if there is evidence of a fungal infection: Serum galactomannan and beta D glucan testing  But for restless leg syndrome which is worse: Check serum ferritin level  We will have you follow-up with a nurse practitioner in about 2 weeks.  If all testing suggest that sarcoid is recurrent then we will need to start methotrexate at that point.   Current Outpatient Medications:  .   aspirin EC 81 MG tablet, Take 81 mg by mouth at bedtime., Disp: , Rfl:  .  Azelaic Acid 15 % cream, After skin is thoroughly washed and patted dry, gently but thoroughly massage a thin film of azelaic acid cream into the affected area twice daily, in the morning and evening. (Patient taking differently: Apply 1 application topically 2 (two) times daily as needed (rosacea.). After skin is thoroughly washed and patted dry, gently but thoroughly massage a thin film of azelaic acid cream into the affected area twice daily, in the morning and evening as needed for rosacea), Disp: 50 g, Rfl: 4 .  Dextrose-Fructose-Sod Citrate (NAUZENE) [phone] MG CHEW, Chew 2 tablets by mouth 2 (two) times daily as needed (nausea/vomiting)., Disp: , Rfl:  .  levothyroxine (SYNTHROID, LEVOTHROID) 50 MCG tablet, TAKE 1 TABLET (50 MCG TOTAL) BY MOUTH DAILY., Disp: 90 tablet, Rfl: 2 .  loratadine (CLARITIN) 10 MG tablet, Take 10 mg by mouth at bedtime. , Disp: ,  Rfl:  .  LORazepam (ATIVAN) 0.5 MG tablet, Take 0.25-0.5 mg by mouth at bedtime as needed for anxiety (for restless leg/sleep.)., Disp: , Rfl:  .  Menthol, Topical Analgesic, (STOPAIN EX), Apply 1 spray topically 3 (three) times daily as needed (for legs pain)., Disp: , Rfl:  .  methadone (DOLOPHINE) 5 MG tablet, Take 7.5 mg by mouth daily at 6 PM. For restless legs, Disp: , Rfl:  .  Multiple Vitamin (MULTIVITAMIN WITH MINERALS) TABS tablet, Take 1 tablet by mouth 2 (two) times daily., Disp: , Rfl:  .  ondansetron (ZOFRAN ODT) 4 MG disintegrating tablet, Take 1 tablet (4 mg total) by mouth every 8 (eight) hours as needed for nausea or vomiting., Disp: 24 tablet, Rfl: 1 .  sennosides-docusate sodium (SENOKOT-S) 8.6-50 MG tablet, Take 1 tablet by mouth 2 (two) times daily as needed for constipation. , Disp: , Rfl:  .  Wheat Dextrin (BENEFIBER PO), Take 1 Dose by mouth at bedtime. , Disp: , Rfl:  .  DULoxetine (CYMBALTA) 30 MG capsule, Take 60 mg by mouth daily with  breakfast. , Disp: , Rfl:  .  predniSONE (DELTASONE) 10 MG tablet, Take 1 tablet (10 mg total) by mouth daily with breakfast., Disp: 10 tablet, Rfl: 0

## 2018-01-12 LAB — SPECIMEN STATUS REPORT

## 2018-01-12 LAB — FUNGITELL, SERUM

## 2018-01-13 LAB — SPECIMEN STATUS REPORT

## 2018-01-13 LAB — ASPERGILLUS ANTIGEN, BAL/SERUM: Aspergillus Ag, BAL/Serum: 0.05 Index (ref 0.00–0.49)

## 2018-01-15 LAB — SPECIMEN STATUS REPORT

## 2018-01-15 LAB — FUNGITELL, SERUM: Fungitell Result: <31 pg/mL (ref <80)

## 2018-01-20 ENCOUNTER — Ambulatory Visit (INDEPENDENT_AMBULATORY_CARE_PROVIDER_SITE_OTHER)
Admission: RE | Admit: 2018-01-20 | Discharge: 2018-01-20 | Disposition: A | Discharge status: Home / Self Care | Payer: PPO | Source: Ambulatory Visit | Attending: Pulmonary Disease | Admitting: Pulmonary Disease

## 2018-01-20 DIAGNOSIS — D869 Sarcoidosis, unspecified: Secondary | ICD-10-CM | POA: Diagnosis not present

## 2018-01-24 ENCOUNTER — Telehealth: Payer: Self-pay | Admitting: Pulmonary Disease

## 2018-01-24 NOTE — Telephone Encounter (Signed)
Patient received lab results from [facility] this morning. Patient is aware. Nothing further needed.

## 2018-01-27 ENCOUNTER — Telehealth: Payer: Self-pay | Admitting: Acute Care

## 2018-01-27 ENCOUNTER — Ambulatory Visit (INDEPENDENT_AMBULATORY_CARE_PROVIDER_SITE_OTHER): Payer: PPO | Admitting: Acute Care

## 2018-01-27 ENCOUNTER — Other Ambulatory Visit (INDEPENDENT_AMBULATORY_CARE_PROVIDER_SITE_OTHER): Payer: PPO

## 2018-01-27 ENCOUNTER — Encounter: Payer: Self-pay | Admitting: Acute Care

## 2018-01-27 VITALS — BP 112/82 | HR 74 | Ht 62.5 in | Wt 177.4 lb

## 2018-01-27 DIAGNOSIS — D860 Sarcoidosis of lung: Secondary | ICD-10-CM

## 2018-01-27 DIAGNOSIS — D869 Sarcoidosis, unspecified: Secondary | ICD-10-CM

## 2018-01-27 LAB — CALCIUM: Calcium: 9.1 mg/dL (ref 8.4–10.5)

## 2018-01-27 NOTE — Progress Notes (Signed)
History of Present Illness Grace Schmitt is a 73 y.o. female with pulmonary sarcoidosis, fibromyalgia, and restless leg syndrome.  She is followed by Dr. Lake Schmitt  Synopsis: Sarcoidosis. Referred in the fall 2018 for evaluation of mediastinal lymphadenopathy discovered in the midst of symptomatic hypercalcemia and some pulmonary nodules.  Underwent an endobronchial ultrasound-guided needle aspiration of mediastinal lymphadenopathy and found to have noncaseating granulomas.  01/27/2018 Follow up OV: Pt. Presents for follow up. She states she is feeling better. She does not feel her sarcoid is an issue at present. She does have trouble determinimg the difference between her fibromyalgia pain and her sarcoid pain..She feels sarcoid  is stable. She is currently complaining of restless leg syndrome and fibromyalgia.Marland KitchenShe would like to find physicians who specialize in both in [place] instead of having to have to travel to [facility].. She has been off her prednisone since last week. Her chest  pain is gone. CT shows improvement. She denies fever, chest pain, orthopnea or hemoptysis.She is on Methotrexate for her fibromyalgia pain.  basic metabolic panel, WNL CT scan of the chest, Improved   Test Results: CT Chest 01/20/2018>>Significant improvement in the previously seen nodularity throughout the lungs and mediastinal and hilar adenopathy. No current adenopathy. Minimal residual scattered nodules, best seen in the right upper lobe.  01/10/2018>>  Ferritin Level:>> WNL Calcium Level>> WNL HGB>> WNL   09/24/2017 Endobronchial biopsy - GRANULOMATOUS INFLAMMATION WITH MULTINUCLEATED GIANT CELLS AND ASTEROID BODIES - NO MALIGNANCY IDENTIFIED - SEE COMMENT Microscopic Comment The granulomas have epithelioid histiocytes with scattered multinucleated giant cells. Special stains for AFB, GMS and PAS are negative for microorganisms. This pattern is consistent with sarcoidosis in the appropriate  clinical setting; correlation with microbiology is recommended to formally exclude an infectious etiology.  CBC Latest Ref Rng & Units 01/11/2018 08/21/2017 08/16/2017  WBC 4.0 - 10.5 K/uL 9.1 7.2 6.8  Hemoglobin 12.0 - 15.0 g/dL 13.7 12.2 12.3  Hematocrit 36.0 - 46.0 % 41.9 37.5 37.6  Platelets 150.0 - 400.0 K/uL 320.0 301 236    BMP Latest Ref Rng & Units 01/27/2018 01/11/2018 12/01/2017  Glucose 70 - 99 mg/dL - 128(H) 106(H)  BUN 6 - 23 mg/dL - 25(H) 21  Creatinine 0.40 - 1.20 mg/dL - 0.70 0.75  Sodium 135 - 145 mEq/L - 139 139  Potassium 3.5 - 5.1 mEq/L - 4.1 4.6  Chloride 96 - 112 mEq/L - 101 100  CO2 19 - 32 mEq/L - 28 31  Calcium 8.4 - 10.5 mg/dL 9.1 9.4 9.3    BNP  ProBNP  PFT    Component Value Date/Time   FEV1PRE 1.74 01/11/2018 0849   FEV1POST 1.74 01/11/2018 0849   FVCPRE 2.07 01/11/2018 0849   FVCPOST 1.97 01/11/2018 0849   TLC 3.76 01/11/2018 0849   DLCOUNC 16.54 01/11/2018 0849   PREFEV1FVCRT 84 01/11/2018 0849   PSTFEV1FVCRT 88 01/11/2018 0849    Dg Chest 2 View  Result Date: 01/11/2018 CLINICAL DATA:  One week of cough and chest pain. History of sarcoidosis and hyperlipidemia. Nonsmoker. EXAM: CHEST  2 VIEW COMPARISON:  Chest x-ray of September 22, 2017 FINDINGS: The lungs remain well-expanded. The interstitial markings remain increased. There is a prominent epicardial fat pad in the right cardiophrenic angle anteriorly. There is no alveolar infiltrate or pleural effusion. The heart and pulmonary vascularity are normal. There calcification in the wall of the thoracic aorta. The bony thorax exhibits no acute abnormality. IMPRESSION: Chronic interstitial changes, stable, compatible with known pulmonary sarcoidosis. No  pneumonia nor CHF. Thoracic aortic atherosclerosis. Electronically Signed   By: Grace  Schmitt M.D.   On: 01/11/2018 08:55   Ct Chest Wo Contrast  Result Date: 01/21/2018 CLINICAL DATA:  Follow-up sarcoidosis EXAM: CT CHEST WITHOUT CONTRAST TECHNIQUE:  Multidetector CT imaging of the chest was performed following the standard protocol without IV contrast. COMPARISON:  08/26/2017 FINDINGS: Cardiovascular: Heart is normal size. Aorta is normal caliber. Coronary artery calcifications noted in the left anterior descending coronary artery. Scattered aortic calcifications. Mediastinum/Nodes: No mediastinal, hilar, or axillary adenopathy. Small scattered mediastinal lymph nodes, none pathologically enlarged. Index precarinal lymph node measures 6 mm on today's study compared with 12 mm previously. Lungs/Pleura: Previously seen nodularity throughout the lungs has significantly improved. Index right middle lobe nodule measures approximately 7 mm compared to 10 mm previously. Clustered peribronchovascular nodularity in the anterior right upper lobe noted, but markedly improved since prior study. No effusions. Upper Abdomen: Imaging into the upper abdomen shows no acute findings. Musculoskeletal: Chest wall soft tissues are unremarkable. No acute bony abnormality. IMPRESSION: Significant improvement in the previously seen nodularity throughout the lungs and mediastinal and hilar adenopathy. No current adenopathy. Minimal residual scattered nodules, best seen in the right upper lobe. Coronary artery disease. Aortic Atherosclerosis (ICD10-I70.0). Electronically Signed   By: Grace Schmitt M.D.   On: 01/21/2018 07:08     Past medical hx Past Medical History:  Diagnosis Date  . Allergy    prn claritin  . Depression   . Fibromyalgia   . Headache    hx of none since menopause  . Hx of colonic polyps serrated and adenomatous 05/28/2005  . Hyperlipidemia   . Hypothyroidism   . Ischemic chest pain   . PONV (postoperative nausea and vomiting)   . Restless leg syndrome   . Sarcoidosis    lung mass  . Thyroid disease      Social History   Tobacco Use  . Smoking status: Never Smoker  . Smokeless tobacco: Never Used  Substance Use Topics  . Alcohol use: No     Alcohol/week: 0.0 oz  . Drug use: No    [name] reports that  has never smoked. she has never used smokeless tobacco. She reports that she does not drink alcohol or use drugs.  Tobacco Cessation: Never Smoker  Past surgical hx, Family hx, Social hx all reviewed.  Current Outpatient Medications on File Prior to Visit  Medication Sig  . aspirin EC 81 MG tablet Take 81 mg by mouth at bedtime.  . Azelaic Acid 15 % cream After skin is thoroughly washed and patted dry, gently but thoroughly massage a thin film of azelaic acid cream into the affected area twice daily, in the morning and evening. (Patient taking differently: Apply 1 application topically 2 (two) times daily as needed (rosacea.). After skin is thoroughly washed and patted dry, gently but thoroughly massage a thin film of azelaic acid cream into the affected area twice daily, in the morning and evening as needed for rosacea)  . Dextrose-Fructose-Sod Citrate (NAUZENE) [phone] MG CHEW Chew 2 tablets by mouth 2 (two) times daily as needed (nausea/vomiting).  Marland Kitchen levothyroxine (SYNTHROID, LEVOTHROID) 50 MCG tablet TAKE 1 TABLET (50 MCG TOTAL) BY MOUTH DAILY.  Marland Kitchen loratadine (CLARITIN) 10 MG tablet Take 10 mg by mouth at bedtime.   Marland Kitchen LORazepam (ATIVAN) 0.5 MG tablet Take 0.25-0.5 mg by mouth at bedtime as needed for anxiety (for restless leg/sleep.).  Marland Kitchen Menthol, Topical Analgesic, (STOPAIN EX) Apply 1 spray topically 3 (  three) times daily as needed (for legs pain).  . methadone (DOLOPHINE) 5 MG tablet Take 7.5 mg by mouth daily at 6 PM. For restless legs  . Multiple Vitamin (MULTIVITAMIN WITH MINERALS) TABS tablet Take 1 tablet by mouth 2 (two) times daily.  . ondansetron (ZOFRAN ODT) 4 MG disintegrating tablet Take 1 tablet (4 mg total) by mouth every 8 (eight) hours as needed for nausea or vomiting.  . sennosides-docusate sodium (SENOKOT-S) 8.6-50 MG tablet Take 1 tablet by mouth 2 (two) times daily as needed for constipation.   .  Wheat Dextrin (BENEFIBER PO) Take 1 Dose by mouth at bedtime.   . DULoxetine (CYMBALTA) 30 MG capsule Take 60 mg by mouth daily with breakfast.    No current facility-administered medications on file prior to visit.      Allergies  Allergen Reactions  . Phenergan [Promethazine Hcl] Other (See Comments)    Pt has restless leg syndrome and the phenergan cause the RLS  to worsen     Review Of Systems:  Constitutional:   No  weight loss, night sweats,  Fevers, chills, fatigue, or  lassitude.  HEENT:   No headaches,  Difficulty swallowing,  Tooth/dental problems, or  Sore throat,                No sneezing, itching, ear ache, nasal congestion, post nasal drip,   CV:  Improved  chest pain,  No Orthopnea, PND, swelling in lower extremities, anasarca, dizziness, palpitations, syncope.   GI  No heartburn, indigestion, abdominal pain, nausea, vomiting, diarrhea, change in bowel habits, loss of appetite, bloody stools.   Resp: + shortness of breath with exertion none  at rest.  No excess mucus, no productive cough,  No non-productive cough,  No coughing up of blood.  No change in color of mucus.  No wheezing.  No chest wall deformity  Skin: no rash or lesions.  GU: no dysuria, change in color of urine, no urgency or frequency.  No flank pain, no hematuria   MS:  No joint pain or swelling.  No decreased range of motion.  No back pain.  Psych:  No change in mood or affect. No depression or anxiety.  No memory loss.   Vital Signs BP 112/82 (BP Location: Left Arm, Cuff Size: Normal)   Pulse 74   Ht 5' 2.5" (1.588 m)   Wt 177 lb 6.4 oz (80.5 kg)   SpO2 99%   BMI 31.93 kg/m    Physical Exam:  General- No distress,  A&Ox3, pleasant ENT: No sinus tenderness, TM clear, pale nasal mucosa, no oral exudate,no post nasal drip, + L slight  LAN Cardiac: S1, S2, regular rate and rhythm, no murmur Chest: No wheeze/ rales/ dullness; no accessory muscle use, no nasal flaring, no sternal  retractions Abd.: Soft Non-tender, nondistended, obese, bowel sounds positive. Ext: No clubbing cyanosis, edema Neuro:  normal strength, moving all extremities x4, alert and oriented x3 Skin: No rashes, warm and dry Psych: normal mood and behavior   Assessment/Plan  Pulmonary sarcoidosis (HCC) Off steroids x 1 week with significant improvement in symptoms CT scan shows improvement  She has a hard time differentiating between her fibromyalgia pain and her sarcoid pain Plan: CT scan shows improvement. Calcium level today Pt. Is on Methadone  for her Fibromyalgia/ RLS We will call you with results Follow up in 4 weeks with Dr. Lake Bells NP to ensure you are still doing well. Please contact office for sooner follow up if  symptoms do not improve or worsen or seek emergency care      Magdalen Spatz, NP 01/27/2018  4:48 PM

## 2018-01-27 NOTE — Patient Instructions (Addendum)
It is nice to see you today. CT scan shows improvement. Calcium level today We will call you with results Follow up in 4 weeks with Dr. Lake Bells NP to ensure you are still doing well. Please contact office for sooner follow up if symptoms do not improve or worsen or seek emergency care

## 2018-01-27 NOTE — Assessment & Plan Note (Addendum)
Off steroids x 1 week with significant improvement in symptoms CT scan shows improvement  She has a hard time differentiating between her fibromyalgia pain and her sarcoid pain Plan: CT scan shows improvement. Calcium level today Pt. Is on Methadone  for her Fibromyalgia/ RLS We will call you with results Follow up in 4 weeks with Dr. Lake Bells NP to ensure you are still doing well. Please contact office for sooner follow up if symptoms do not improve or worsen or seek emergency care

## 2018-01-27 NOTE — Telephone Encounter (Signed)
I saw Grace Schmitt today, She is off her prednisone and relatively pain-free.  She states she is on her methadone for her fibromyalgia/restless leg syndrome symptoms.  She has been off her prednisone for approximately 1 week.  Please confirm that you are okay with her not being on prednisone at present.  She has had significant improvement in CT and and symptoms.  I am checking calcium level today, but wanted to make sure you are aware she was off her prednisone as your last note indicated continue low-dose prednisone for now.  Thanks so much

## 2018-01-27 NOTE — Telephone Encounter (Signed)
Yes, Ok to stay off prednisone, needs close monitoring, come back in 6 weeks

## 2018-03-01 DIAGNOSIS — G2581 Restless legs syndrome: Secondary | ICD-10-CM | POA: Diagnosis not present

## 2018-03-01 DIAGNOSIS — M797 Fibromyalgia: Secondary | ICD-10-CM | POA: Diagnosis not present

## 2018-03-08 ENCOUNTER — Encounter: Payer: Self-pay | Admitting: Pulmonary Disease

## 2018-03-08 ENCOUNTER — Ambulatory Visit: Payer: PPO | Admitting: Pulmonary Disease

## 2018-03-08 VITALS — BP 130/82 | HR 83 | Ht 62.5 in | Wt 178.0 lb

## 2018-03-08 DIAGNOSIS — D860 Sarcoidosis of lung: Secondary | ICD-10-CM

## 2018-03-08 NOTE — Patient Instructions (Signed)
Pulmonary sarcoidosis: We will repeat a lung function test in July 2019 and see you after that I agree with your plan to see an eye physician  Fibromyalgia: You should consider seeing Dr. Estanislado Pandy  Hypercalcemia due to sarcoidosis: Normal  We will plan to see you in July

## 2018-03-08 NOTE — Progress Notes (Signed)
Subjective:    Patient ID: Grace Schmitt, female    DOB: 03-26-1946, 72 y.o.   MRN: [id number]  Synopsis: Sarcoidosis. Referred in the fall 2018 for evaluation of mediastinal lymphadenopathy discovered in the midst of symptomatic hypercalcemia and some pulmonary nodules.  Underwent an endobronchial ultrasound-guided needle aspiration of mediastinal lymphadenopathy and found to have noncaseating granulomas.  She was treated with prednisone in 2018 with resolution of hypercalcemia and symptoms.  She also has fibromyalgia.  HPI Chief Complaint  Patient presents with  . Follow-up    pt states she is doing well, denies any current breathing complaints.     Grace Schmitt is doing better.  She now has some pain in her right chest compared to before.  She says that she accidentally flipped in an air mattress and landed on the floor on the right and feels some pain on the right flank/chest.  It hurts when she reaches up or pulls something.    She had her calcium level checked last week and it was normal.  She has been off of the prednisone lately and is doing OK.    She denies shortness of breath or cough.  No recent rash or visual changes.  She does think that she may need a new prescription for her glasses.     Past Medical History:  Diagnosis Date  . Allergy    prn claritin  . Depression   . Fibromyalgia   . Headache    hx of none since menopause  . Hx of colonic polyps serrated and adenomatous 05/28/2005  . Hyperlipidemia   . Hypothyroidism   . Ischemic chest pain   . PONV (postoperative nausea and vomiting)   . Restless leg syndrome   . Sarcoidosis    lung mass  . Thyroid disease         Review of Systems  Constitutional: Negative for fever and unexpected weight change.  HENT: Negative for nosebleeds, postnasal drip, rhinorrhea, sinus pressure, sneezing and sore throat.   Respiratory: Negative for cough, chest tightness, shortness of breath and wheezing.     Cardiovascular: Negative for palpitations and leg swelling.  Gastrointestinal: Negative for vomiting.       Objective:   Physical Exam Vitals:   03/08/18 1504  BP: 130/82  Pulse: 83  SpO2: 96%  Weight: 178 lb (80.7 kg)  Height: 5' 2.5" (1.588 m)   RA  Gen: well appearing HENT: OP clear, TM's clear, neck supple PULM: CTA B, normal percussion CV: RRR, no mgr, trace edema GI: BS+, soft, nontender Derm: no cyanosis or rash Psyche: normal mood and affect     CBC    Component Value Date/Time   WBC 9.1 01/11/2018 1132   RBC 4.46 01/11/2018 1132   HGB 13.7 01/11/2018 1132   HCT 41.9 01/11/2018 1132   PLT 320.0 01/11/2018 1132   MCV 94.0 01/11/2018 1132   MCH 29.5 08/21/2017 2225   MCHC 32.6 01/11/2018 1132   RDW 13.3 01/11/2018 1132   LYMPHSABS 1.0 01/11/2018 1132   MONOABS 0.2 01/11/2018 1132   EOSABS 0.0 01/11/2018 1132   BASOSABS 0.1 01/11/2018 1132   BMET    Component Value Date/Time   NA 139 01/11/2018 1132   K 4.1 01/11/2018 1132   CL 101 01/11/2018 1132   CO2 28 01/11/2018 1132   GLUCOSE 128 (H) 01/11/2018 1132   BUN 25 (H) 01/11/2018 1132   CREATININE 0.70 01/11/2018 1132   CALCIUM 9.1 01/27/2018 1106  CALCIUM 11.4 (H) 08/14/2017 0056   GFRNONAA >60 08/21/2017 2225   GFRAA >60 08/21/2017 2225   Other imaging: MRI brain August 2018 showed no evidence of a stroke  Chest imaging: August 2018 CT chest images independently reviewed showing upper lobe predominant bronchovascular distributed pulmonary nodules, there is one predominant nodule in the right middle lobe which is about 1.1 cm in size. There is also significant mediastinal lymphadenopathy.  Echocardiogram: August 2018 transthoracic echocardiogram showing normal LVEF, grade 1 diastolic dysfunction  Pulmonary function testing: January 2019 ratio normal, FEV1 1.74 L, FVC 1.97 L 71% predicted, total lung capacity 3.76 L 77% predicted, DLCO 16.54 mL 73% predicted  Micro: September 2018 BAL:  Mucor species January 2018 Beta-D-glucan negative, Galactomannan negative  Pathology: September 22, 2017 granulomatous inflammation on endobronchial biopsy and lymph node needle aspiration  Labs: August 2018 in the midst of hypercalcemia: 11 pg per mL (low), PTH related peptide was negative, 1 25-hydroxy vitamin D normal 02/2018 BMET: Calcium 9.3 mg/dL     Assessment & Plan:  Pulmonary sarcoidosis ([place]) - Plan: Pulmonary function test  Discussion: This has been a stable interval for Grace Schmitt.  She is off of prednisone and has no evidence of recurrence of the sarcoidosis.  Last weeks calcium value was normal.  The CT scan and testing we did back in January showed no evidence of recurrent disease.  At this point I think it is best to follow closely and we will plan on repeating a lung function test in July.  Plan: Pulmonary sarcoidosis: We will repeat a lung function test in July 2019 and see you after that I agree with your plan to see an eye physician  Fibromyalgia: You should consider seeing Dr. Estanislado Pandy  Hypercalcemia due to sarcoidosis: Normal  We will plan to see you in July   Current Outpatient Medications:  .  aspirin EC 81 MG tablet, Take 81 mg by mouth at bedtime., Disp: , Rfl:  .  Azelaic Acid 15 % cream, After skin is thoroughly washed and patted dry, gently but thoroughly massage a thin film of azelaic acid cream into the affected area twice daily, in the morning and evening. (Patient taking differently: Apply 1 application topically 2 (two) times daily as needed (rosacea.). After skin is thoroughly washed and patted dry, gently but thoroughly massage a thin film of azelaic acid cream into the affected area twice daily, in the morning and evening as needed for rosacea), Disp: 50 g, Rfl: 4 .  Dextrose-Fructose-Sod Citrate (NAUZENE) [phone] MG CHEW, Chew 2 tablets by mouth 2 (two) times daily as needed (nausea/vomiting)., Disp: , Rfl:  .  DULoxetine (CYMBALTA) 30  MG capsule, Take 60 mg by mouth daily with breakfast. , Disp: , Rfl:  .  levothyroxine (SYNTHROID, LEVOTHROID) 50 MCG tablet, TAKE 1 TABLET (50 MCG TOTAL) BY MOUTH DAILY., Disp: 90 tablet, Rfl: 2 .  loratadine (CLARITIN) 10 MG tablet, Take 10 mg by mouth at bedtime. , Disp: , Rfl:  .  LORazepam (ATIVAN) 0.5 MG tablet, Take 0.25-0.5 mg by mouth at bedtime as needed for anxiety (for restless leg/sleep.)., Disp: , Rfl:  .  Menthol, Topical Analgesic, (STOPAIN EX), Apply 1 spray topically 3 (three) times daily as needed (for legs pain)., Disp: , Rfl:  .  methadone (DOLOPHINE) 5 MG tablet, Take 7.5 mg by mouth daily at 6 PM. For restless legs, Disp: , Rfl:  .  Multiple Vitamin (MULTIVITAMIN WITH MINERALS) TABS tablet, Take 1 tablet by mouth 2 (  two) times daily., Disp: , Rfl:  .  ondansetron (ZOFRAN ODT) 4 MG disintegrating tablet, Take 1 tablet (4 mg total) by mouth every 8 (eight) hours as needed for nausea or vomiting., Disp: 24 tablet, Rfl: 1 .  sennosides-docusate sodium (SENOKOT-S) 8.6-50 MG tablet, Take 1 tablet by mouth 2 (two) times daily as needed for constipation. , Disp: , Rfl:  .  Wheat Dextrin (BENEFIBER PO), Take 1 Dose by mouth at bedtime. , Disp: , Rfl:

## 2018-03-09 ENCOUNTER — Encounter: Payer: Self-pay | Admitting: Family Medicine

## 2018-03-09 ENCOUNTER — Ambulatory Visit (INDEPENDENT_AMBULATORY_CARE_PROVIDER_SITE_OTHER): Payer: PPO | Admitting: Family Medicine

## 2018-03-09 VITALS — BP 120/60 | HR 83 | Temp 98.2°F | Ht 62.5 in | Wt 177.0 lb

## 2018-03-09 DIAGNOSIS — R0781 Pleurodynia: Secondary | ICD-10-CM | POA: Diagnosis not present

## 2018-03-09 NOTE — Patient Instructions (Addendum)
Chest Wall Pain °Chest wall pain is pain in or around the bones and muscles of your chest. Sometimes, an injury causes this pain. Sometimes, the cause may not be known. This pain may take several weeks or longer to get better. °Follow these instructions at home: °Pay attention to any changes in your symptoms. Take these actions to help with your pain: °· Rest as told by your health care provider. °· Avoid activities that cause pain. These include any activities that use your chest muscles or your abdominal and side muscles to lift heavy items. °· If directed, apply ice to the painful area: °? Put ice in a plastic bag. °? Place a towel between your skin and the bag. °? Leave the ice on for 20 minutes, 2-3 times per day. °· Take over-the-counter and prescription medicines only as told by your health care provider. °· Do not use tobacco products, including cigarettes, chewing tobacco, and e-cigarettes. If you need help quitting, ask your health care provider. °· Keep all follow-up visits as told by your health care provider. This is important. ° °Contact a health care provider if: °· You have a fever. °· Your chest pain becomes worse. °· You have new symptoms. °Get help right away if: °· You have nausea or vomiting. °· You feel sweaty or light-headed. °· You have a cough with phlegm (sputum) or you cough up blood. °· You develop shortness of breath. °This information is not intended to replace advice given to you by your health care provider. Make sure you discuss any questions you have with your health care provider. °Document Released: 12/14/2005 Document Revised: 04/23/2016 Document Reviewed: 03/11/2015 °[employer]vier Interactive Patient Education © 2018 [employer] ° °

## 2018-03-09 NOTE — Progress Notes (Signed)
Subjective:    Patient ID: Grace Schmitt, female    DOB: 11-10-1946, 72 y.o.   MRN: [id number]  Chief Complaint  Patient presents with  . Abdominal Pain    HPI Patient was seen today for acute concern.  Pt endorses 1 week ago leaning over while on an air mattress causing it to flip.  Pt fell against the hardwood floor and her right arm.  Since then pt has endorsed soreness in her right side which seemed to get better until she lifted a bag of groceries a few days ago.  At that time pt endorsed a searing pain in her right lower rib cage.  She has taken Tylenol and ibuprofen occasionally for this pain.  She states she was told not to take NSAIDs given some of her medications that she is on.  Pt denies SOB, pain with breathing, bruising.   Past Medical History:  Diagnosis Date  . Allergy    prn claritin  . Depression   . Fibromyalgia   . Headache    hx of none since menopause  . Hx of colonic polyps serrated and adenomatous 05/28/2005  . Hyperlipidemia   . Hypothyroidism   . Ischemic chest pain   . PONV (postoperative nausea and vomiting)   . Restless leg syndrome   . Sarcoidosis    lung mass  . Thyroid disease     Allergies  Allergen Reactions  . Phenergan [Promethazine Hcl] Other (See Comments)    Pt has restless leg syndrome and the phenergan cause the RLS  to worsen     ROS General: Denies fever, chills, night sweats, changes in weight, changes in appetite HEENT: Denies headaches, ear pain, changes in vision, rhinorrhea, sore throat CV: Denies CP, palpitations, SOB, orthopnea Pulm: Denies SOB, cough, wheezing GI: Denies abdominal pain, nausea, vomiting, diarrhea, constipation GU: Denies dysuria, hematuria, frequency, vaginal discharge Msk: Denies muscle cramps, joint pains  +chest wall pain Neuro: Denies weakness, numbness, tingling Skin: Denies rashes, bruising Psych: Denies depression, anxiety, hallucinations     Objective:    Blood pressure 120/60, pulse 83,  temperature 98.2 F (36.8 C), temperature source Oral, height 5' 2.5" (1.588 m), weight 177 lb (80.3 kg), SpO2 98 %.   Gen. Pleasant, well-nourished, in no distress, normal affect   HEENT: [place]/AT, face symmetric, no scleral icterus, PERRLA, nares patent without drainage Lungs: no accessory muscle use, CTAB, no wheezes or rales Cardiovascular: RRR, no m/r/g, no peripheral edema Abdomen: BS present, soft, NT/ND, no hepatosplenomegaly. Musculoskeletal: No deformities, no cyanosis or clubbing, normal tone.  Discomfort reproduced with patient lifting R arm above her head.  No TTP to right flank.  chest wall with point tenderness at R upper lateral breast. Neuro:  A&Ox3, CN II-XII intact, normal gait Skin:  Warm, no lesions/ rash.  No ecchymosis of chest wall or R arm.   Wt Readings from Last 3 Encounters:  03/09/18 177 lb (80.3 kg)  03/08/18 178 lb (80.7 kg)  01/27/18 177 lb 6.4 oz (80.5 kg)    Lab Results  Component Value Date   WBC 9.1 01/11/2018   HGB 13.7 01/11/2018   HCT 41.9 01/11/2018   PLT 320.0 01/11/2018   GLUCOSE 128 (H) 01/11/2018   CHOL 192 08/14/2017   TRIG 100 08/14/2017   HDL 55 08/14/2017   LDLDIRECT 107.1 04/27/2011   LDLCALC 117 (H) 08/14/2017   ALT 16 08/16/2017   AST 20 08/16/2017   NA 139 01/11/2018   K 4.1 01/11/2018  CL 101 01/11/2018   CREATININE 0.70 01/11/2018   BUN 25 (H) 01/11/2018   CO2 28 01/11/2018   TSH 4.114 08/14/2017   INR 1.05 08/13/2017   HGBA1C 5.7 (H) 08/14/2017    Assessment/Plan:  Rib tenderness  -likely 2/2 MSK strain.  Less concern for rib fx . -supportive care -pt given handout -ok to take Tyelnol prn.  Can also apply heat or ice to the area. -f/u prn  Grier Mitts, MD

## 2018-03-10 ENCOUNTER — Encounter: Payer: Self-pay | Admitting: Family Medicine

## 2018-03-15 DIAGNOSIS — H524 Presbyopia: Secondary | ICD-10-CM | POA: Diagnosis not present

## 2018-03-15 DIAGNOSIS — H40013 Open angle with borderline findings, low risk, bilateral: Secondary | ICD-10-CM | POA: Diagnosis not present

## 2018-05-16 ENCOUNTER — Encounter: Payer: Self-pay | Admitting: Internal Medicine

## 2018-05-16 ENCOUNTER — Ambulatory Visit (INDEPENDENT_AMBULATORY_CARE_PROVIDER_SITE_OTHER): Payer: PPO | Admitting: Internal Medicine

## 2018-05-16 VITALS — BP 110/60 | HR 73 | Temp 98.3°F | Wt 177.0 lb

## 2018-05-16 DIAGNOSIS — R0989 Other specified symptoms and signs involving the circulatory and respiratory systems: Secondary | ICD-10-CM

## 2018-05-16 NOTE — Progress Notes (Signed)
Subjective:    Patient ID: Grace Schmitt, female    DOB: 1946-03-18, 72 y.o.   MRN: [id number]  HPI 72 year old patient who is seen today with a blood pressure concerns.  She states that over the past 2 weeks she has had some labile blood pressure readings with some readings as high as 170/90.  She states that she has had some blood pressure elevations approximately 50% of the time. Blood pressure on arrival today low normal  Blood pressure readings in the office were checked in both arms and were a bit variable.  Blood pressure generally ranged from low normal to a high of 126/80  Past Medical History:  Diagnosis Date  . Allergy    prn claritin  . Depression   . Fibromyalgia   . Headache    hx of none since menopause  . Hx of colonic polyps serrated and adenomatous 05/28/2005  . Hyperlipidemia   . Hypothyroidism   . Ischemic chest pain   . PONV (postoperative nausea and vomiting)   . Restless leg syndrome   . Sarcoidosis    lung mass  . Thyroid disease      Social History   Socioeconomic History  . Marital status: Married    Spouse name: Not on file  . Number of children: Not on file  . Years of education: Not on file  . Highest education level: Not on file  Occupational History  . Not on file  Social Needs  . Financial resource strain: Not on file  . Food insecurity:    Worry: Not on file    Inability: Not on file  . Transportation needs:    Medical: Not on file    Non-medical: Not on file  Tobacco Use  . Smoking status: Never Smoker  . Smokeless tobacco: Never Used  Substance and Sexual Activity  . Alcohol use: No    Alcohol/week: 0.0 oz  . Drug use: No  . Sexual activity: Not Currently  Lifestyle  . Physical activity:    Days per week: Not on file    Minutes per session: Not on file  . Stress: Not on file  Relationships  . Social connections:    Talks on phone: Not on file    Gets together: Not on file    Attends religious service: Not on file      Active member of club or organization: Not on file    Attends meetings of clubs or organizations: Not on file    Relationship status: Not on file  . Intimate partner violence:    Fear of current or ex partner: Not on file    Emotionally abused: Not on file    Physically abused: Not on file    Forced sexual activity: Not on file  Other Topics Concern  . Not on file  Social History Narrative  . Not on file    Past Surgical History:  Procedure Laterality Date  . COLONOSCOPY  October 2011  . ENDOBRONCHIAL ULTRASOUND Bilateral 09/20/2017   Procedure: ENDOBRONCHIAL ULTRASOUND;  Surgeon: Juanito Doom, MD;  Location: [facility];  Service: Cardiopulmonary;  Laterality: Bilateral;  . TONSILLECTOMY  1951  . TONSILLECTOMY    . TUBAL LIGATION  1978   x2    Family History  Problem Relation Age of Onset  . Dementia Mother   . Heart disease Mother        smal vessel disease  . Hypertension Brother   . Colon cancer Neg  Hx   . Colon polyps Neg Hx   . Breast cancer Neg Hx     Allergies  Allergen Reactions  . Phenergan [Promethazine Hcl] Other (See Comments)    Pt has restless leg syndrome and the phenergan cause the RLS  to worsen     Current Outpatient Medications on File Prior to Visit  Medication Sig Dispense Refill  . aspirin EC 81 MG tablet Take 81 mg by mouth at bedtime.    . Azelaic Acid 15 % cream After skin is thoroughly washed and patted dry, gently but thoroughly massage a thin film of azelaic acid cream into the affected area twice daily, in the morning and evening. (Patient taking differently: Apply 1 application topically 2 (two) times daily as needed (rosacea.). After skin is thoroughly washed and patted dry, gently but thoroughly massage a thin film of azelaic acid cream into the affected area twice daily, in the morning and evening as needed for rosacea) 50 g 4  . co-enzyme Q-10 30 MG capsule Take 30 mg by mouth 3 (three) times daily.    Marland Kitchen Dextrose-Fructose-Sod  Citrate (NAUZENE) [phone] MG CHEW Chew 2 tablets by mouth 2 (two) times daily as needed (nausea/vomiting).    Marland Kitchen levothyroxine (SYNTHROID, LEVOTHROID) 50 MCG tablet TAKE 1 TABLET (50 MCG TOTAL) BY MOUTH DAILY. 90 tablet 2  . loratadine (CLARITIN) 10 MG tablet Take 10 mg by mouth at bedtime.     Marland Kitchen LORazepam (ATIVAN) 0.5 MG tablet Take 0.25-0.5 mg by mouth at bedtime as needed for anxiety (for restless leg/sleep.).    Marland Kitchen Menthol, Topical Analgesic, (STOPAIN EX) Apply 1 spray topically 3 (three) times daily as needed (for legs pain).    . methadone (DOLOPHINE) 5 MG tablet Take 7.5 mg by mouth daily at 6 PM. For restless legs    . Multiple Vitamin (MULTIVITAMIN WITH MINERALS) TABS tablet Take 1 tablet by mouth 2 (two) times daily.    . sennosides-docusate sodium (SENOKOT-S) 8.6-50 MG tablet Take 1 tablet by mouth 2 (two) times daily as needed for constipation.     . TURMERIC PO Take by mouth.    . Wheat Dextrin (BENEFIBER PO) Take 1 Dose by mouth at bedtime.     . DULoxetine (CYMBALTA) 30 MG capsule Take 60 mg by mouth daily with breakfast.      No current facility-administered medications on file prior to visit.     BP 110/60 (BP Location: Right Arm, Patient Position: Sitting, Cuff Size: Large)   Pulse 73   Temp 98.3 F (36.8 C) (Oral)   Wt 177 lb (80.3 kg)   SpO2 96%   BMI 31.86 kg/m      Review of Systems  Constitutional: Negative.        Objective:   Physical Exam  Constitutional: She appears well-developed and well-nourished. No distress.          Assessment & Plan:   Labile blood pressure readings.  Blood pressures have ranged from low normal to as high as 170/90 at home.  Blood pressure readings here acceptable.  We will schedule an annual exam and reassess blood pressure at her next visit.  She was asked to bring her home blood pressure monitor for her next office visit. Low-salt diet recommended Patient given information concerning a [place]

## 2018-05-16 NOTE — Patient Instructions (Addendum)
Limit your sodium (Salt) intake  Please check your blood pressure on a regular basis.  If it is consistently greater than 150/90, please make an office appointment.   DASH Eating Plan DASH stands for "Dietary Approaches to Stop Hypertension." The DASH eating plan is a healthy eating plan that has been shown to reduce high blood pressure (hypertension). It may also reduce your risk for type 2 diabetes, heart disease, and stroke. The DASH eating plan may also help with weight loss. What are tips for following this plan? General guidelines  Avoid eating more than 2,300 mg (milligrams) of salt (sodium) a day. If you have hypertension, you may need to reduce your sodium intake to 1,500 mg a day.  Limit alcohol intake to no more than 1 drink a day for nonpregnant women and 2 drinks a day for men. One drink equals 12 oz of beer, 5 oz of wine, or 1 oz of hard liquor.  Work with your health care provider to maintain a healthy body weight or to lose weight. Ask what an ideal weight is for you.  Get at least 30 minutes of exercise that causes your heart to beat faster (aerobic exercise) most days of the week. Activities may include walking, swimming, or biking.  Work with your health care provider or diet and nutrition specialist (dietitian) to adjust your eating plan to your individual calorie needs. Reading food labels  Check food labels for the amount of sodium per serving. Choose foods with less than 5 percent of the Daily Value of sodium. Generally, foods with less than 300 mg of sodium per serving fit into this eating plan.  To find whole grains, look for the word "whole" as the first word in the ingredient list. Shopping  Buy products labeled as "low-sodium" or "no salt added."  Buy fresh foods. Avoid canned foods and premade or frozen meals. Cooking  Avoid adding salt when cooking. Use salt-free seasonings or herbs instead of table salt or sea salt. Check with your health care provider  or pharmacist before using salt substitutes.  Do not fry foods. Cook foods using healthy methods such as baking, boiling, grilling, and broiling instead.  Cook with heart-healthy oils, such as olive, canola, soybean, or sunflower oil. Meal planning   Eat a balanced diet that includes: ? 5 or more servings of fruits and vegetables each day. At each meal, try to fill half of your plate with fruits and vegetables. ? Up to 6-8 servings of whole grains each day. ? Less than 6 oz of lean meat, poultry, or fish each day. A 3-oz serving of meat is about the same size as a deck of cards. One egg equals 1 oz. ? 2 servings of low-fat dairy each day. ? A serving of nuts, seeds, or beans 5 times each week. ? Heart-healthy fats. Healthy fats called Omega-3 fatty acids are found in foods such as flaxseeds and coldwater fish, like sardines, salmon, and mackerel.  Limit how much you eat of the following: ? Canned or prepackaged foods. ? Food that is high in trans fat, such as fried foods. ? Food that is high in saturated fat, such as fatty meat. ? Sweets, desserts, sugary drinks, and other foods with added sugar. ? Full-fat dairy products.  Do not salt foods before eating.  Try to eat at least 2 vegetarian meals each week.  Eat more home-cooked food and less restaurant, buffet, and fast food.  When eating at a restaurant, ask that your  food be prepared with less salt or no salt, if possible. What foods are recommended? The items listed may not be a complete list. Talk with your dietitian about what dietary choices are best for you. Grains Whole-grain or whole-wheat bread. Whole-grain or whole-wheat pasta. Brown rice. [place]. Bulgur. Whole-grain and low-sodium cereals. Pita bread. Low-fat, low-sodium crackers. Whole-wheat flour tortillas. Vegetables Fresh or frozen vegetables (raw, steamed, roasted, or grilled). Low-sodium or reduced-sodium tomato and vegetable juice. Low-sodium or  reduced-sodium tomato sauce and tomato paste. Low-sodium or reduced-sodium canned vegetables. Fruits All fresh, dried, or frozen fruit. Canned fruit in natural juice (without added sugar). Meat and other protein foods Skinless chicken or [place]. Ground chicken or [place]. Pork with fat trimmed off. Fish and seafood. Egg whites. Dried beans, peas, or lentils. Unsalted nuts, nut butters, and seeds. Unsalted canned beans. Lean cuts of beef with fat trimmed off. Low-sodium, lean deli meat. Dairy Low-fat (1%) or fat-free (skim) milk. Fat-free, low-fat, or reduced-fat cheeses. Nonfat, low-sodium ricotta or cottage cheese. Low-fat or nonfat yogurt. Low-fat, low-sodium cheese. Fats and oils Soft margarine without trans fats. Vegetable oil. Low-fat, reduced-fat, or light mayonnaise and salad dressings (reduced-sodium). Canola, safflower, olive, soybean, and sunflower oils. Avocado. Seasoning and other foods Herbs. Spices. Seasoning mixes without salt. Unsalted popcorn and pretzels. Fat-free sweets. What foods are not recommended? The items listed may not be a complete list. Talk with your dietitian about what dietary choices are best for you. Grains Baked goods made with fat, such as croissants, muffins, or some breads. Dry pasta or rice meal packs. Vegetables Creamed or fried vegetables. Vegetables in a cheese sauce. Regular canned vegetables (not low-sodium or reduced-sodium). Regular canned tomato sauce and paste (not low-sodium or reduced-sodium). Regular tomato and vegetable juice (not low-sodium or reduced-sodium). Angie Fava. Olives. Fruits Canned fruit in a light or heavy syrup. Fried fruit. Fruit in cream or butter sauce. Meat and other protein foods Fatty cuts of meat. Ribs. Fried meat. Berniece Salines. Sausage. Bologna and other processed lunch meats. Salami. Fatback. Hotdogs. Bratwurst. Salted nuts and seeds. Canned beans with added salt. Canned or smoked fish. Whole eggs or egg yolks. Chicken or [place]  with skin. Dairy Whole or 2% milk, cream, and half-and-half. Whole or full-fat cream cheese. Whole-fat or sweetened yogurt. Full-fat cheese. Nondairy creamers. Whipped toppings. Processed cheese and cheese spreads. Fats and oils Butter. Stick margarine. Lard. Shortening. Ghee. Bacon fat. Tropical oils, such as coconut, palm kernel, or palm oil. Seasoning and other foods Salted popcorn and pretzels. Onion salt, garlic salt, seasoned salt, table salt, and sea salt. Worcestershire sauce. Tartar sauce. Barbecue sauce. Teriyaki sauce. Soy sauce, including reduced-sodium. Steak sauce. Canned and packaged gravies. Fish sauce. Oyster sauce. Cocktail sauce. Horseradish that you find on the shelf. Ketchup. Mustard. Meat flavorings and tenderizers. Bouillon cubes. Hot sauce and Tabasco sauce. Premade or packaged marinades. Premade or packaged taco seasonings. Relishes. Regular salad dressings. Where to find more information:  National Heart, Lung, and [place]: https://wilson-eaton.com/  American Heart Association: www.heart.org Summary  The DASH eating plan is a healthy eating plan that has been shown to reduce high blood pressure (hypertension). It may also reduce your risk for type 2 diabetes, heart disease, and stroke.  With the DASH eating plan, you should limit salt (sodium) intake to 2,300 mg a day. If you have hypertension, you may need to reduce your sodium intake to 1,500 mg a day.  When on the DASH eating plan, aim to eat more fresh fruits and vegetables,  whole grains, lean proteins, low-fat dairy, and heart-healthy fats.  Work with your health care provider or diet and nutrition specialist (dietitian) to adjust your eating plan to your individual calorie needs. This information is not intended to replace advice given to you by your health care provider. Make sure you discuss any questions you have with your health care provider. Document Released: 12/03/2011 Document Revised: 12/07/2016  Document Reviewed: 12/07/2016 Elsevier Interactive Patient Education  [employer].

## 2018-05-19 DIAGNOSIS — M859 Disorder of bone density and structure, unspecified: Secondary | ICD-10-CM | POA: Diagnosis not present

## 2018-05-24 DIAGNOSIS — D869 Sarcoidosis, unspecified: Secondary | ICD-10-CM | POA: Diagnosis not present

## 2018-05-24 DIAGNOSIS — E039 Hypothyroidism, unspecified: Secondary | ICD-10-CM | POA: Diagnosis not present

## 2018-05-24 DIAGNOSIS — M8580 Other specified disorders of bone density and structure, unspecified site: Secondary | ICD-10-CM | POA: Diagnosis not present

## 2018-06-03 ENCOUNTER — Ambulatory Visit (INDEPENDENT_AMBULATORY_CARE_PROVIDER_SITE_OTHER): Payer: PPO | Admitting: Family Medicine

## 2018-06-03 ENCOUNTER — Encounter: Payer: Self-pay | Admitting: Family Medicine

## 2018-06-03 VITALS — BP 132/74 | HR 83 | Temp 98.2°F | Wt 180.6 lb

## 2018-06-03 DIAGNOSIS — R350 Frequency of micturition: Secondary | ICD-10-CM

## 2018-06-03 LAB — POCT URINALYSIS DIPSTICK
Bilirubin, UA: NEGATIVE
Blood, UA: NEGATIVE
Glucose, UA: NEGATIVE
Ketones, UA: NEGATIVE
Leukocytes, UA: NEGATIVE
Nitrite, UA: NEGATIVE
Protein, UA: NEGATIVE
Spec Grav, UA: 1.015 (ref 1.010–1.025)
Urobilinogen, UA: 0.2 E.U./dL
pH, UA: 7 (ref 5.0–8.0)

## 2018-06-03 NOTE — Progress Notes (Signed)
  Subjective:     Patient ID: Grace Schmitt, female   DOB: [birth year], 72 y.o.   MRN: [id number]  HPI Patient seen with some recent frequent urination. She denies any actual burning with urination. She's had increased nocturia at times. No difficulty urinating. Couple nights ago got up about 5 times to urinate within a few hours. Symptoms have been very inconsistent. Denies any fevers or chills. No flank pain.  Couple recent episodes where she felt "faint ". No syncope. Occasional sweats. No documented fever. No chest pains.  Patient has reported history of sarcoidosis and had recent electrolytes and states her calcium levels were normal. Does take Cymbalta but has been on this for some time with no consistent diaphoresis symptoms. Appetite and weight stable. No diarrhea  Past Medical History:  Diagnosis Date  . Allergy    prn claritin  . Depression   . Fibromyalgia   . Headache    hx of none since menopause  . Hx of colonic polyps serrated and adenomatous 05/28/2005  . Hyperlipidemia   . Hypothyroidism   . Ischemic chest pain   . PONV (postoperative nausea and vomiting)   . Restless leg syndrome   . Sarcoidosis    lung mass  . Thyroid disease    Past Surgical History:  Procedure Laterality Date  . COLONOSCOPY  October 2011  . ENDOBRONCHIAL ULTRASOUND Bilateral 09/20/2017   Procedure: ENDOBRONCHIAL ULTRASOUND;  Surgeon: Juanito Doom, MD;  Location: [facility];  Service: Cardiopulmonary;  Laterality: Bilateral;  . TONSILLECTOMY  1951  . TONSILLECTOMY    . TUBAL LIGATION  1978   x2    reports that she has never smoked. She has never used smokeless tobacco. She reports that she does not drink alcohol or use drugs. family history includes Dementia in her mother; Heart disease in her mother; Hypertension in her brother. Allergies  Allergen Reactions  . Phenergan [Promethazine Hcl] Other (See Comments)    Pt has restless leg syndrome and the phenergan cause the RLS  to  worsen      Review of Systems  Constitutional: Negative for chills and fever.  Respiratory: Negative for shortness of breath.   Cardiovascular: Negative for chest pain.  Gastrointestinal: Negative for abdominal pain.  Genitourinary: Positive for frequency. Negative for decreased urine volume, difficulty urinating, flank pain and hematuria.       Objective:   Physical Exam  Constitutional: She appears well-developed and well-nourished.  Cardiovascular: Normal rate and regular rhythm.  Pulmonary/Chest: Effort normal and breath sounds normal. She has no wheezes. She has no rales.       Assessment:     Urine frequency. Urine dipstick is completely normal. No evidence for infection. Question urinary urgency    Plan:     -Minimize caffeine intake -Avoid drinking excessive fluids late at night -Follow-up with primary if symptoms persist to discuss possible treatment options  Eulas Post MD [facility]

## 2018-06-09 ENCOUNTER — Other Ambulatory Visit: Payer: Self-pay | Admitting: Internal Medicine

## 2018-06-09 DIAGNOSIS — Z1231 Encounter for screening mammogram for malignant neoplasm of breast: Secondary | ICD-10-CM

## 2018-07-04 ENCOUNTER — Ambulatory Visit
Admission: RE | Admit: 2018-07-04 | Discharge: 2018-07-04 | Disposition: A | Discharge status: Home / Self Care | Payer: PPO | Source: Ambulatory Visit | Attending: Internal Medicine | Admitting: Internal Medicine

## 2018-07-04 DIAGNOSIS — Z1231 Encounter for screening mammogram for malignant neoplasm of breast: Secondary | ICD-10-CM | POA: Diagnosis not present

## 2018-07-26 ENCOUNTER — Encounter: Payer: Self-pay | Admitting: Internal Medicine

## 2018-07-26 ENCOUNTER — Ambulatory Visit (INDEPENDENT_AMBULATORY_CARE_PROVIDER_SITE_OTHER): Payer: PPO | Admitting: Internal Medicine

## 2018-07-26 VITALS — BP 118/58 | HR 70 | Temp 98.6°F | Ht 62.5 in | Wt 174.8 lb

## 2018-07-26 DIAGNOSIS — Z860100 Personal history of colon polyps, unspecified: Secondary | ICD-10-CM

## 2018-07-26 DIAGNOSIS — D860 Sarcoidosis of lung: Secondary | ICD-10-CM

## 2018-07-26 DIAGNOSIS — Z0000 Encounter for general adult medical examination without abnormal findings: Secondary | ICD-10-CM | POA: Diagnosis not present

## 2018-07-26 DIAGNOSIS — E7800 Pure hypercholesterolemia, unspecified: Secondary | ICD-10-CM

## 2018-07-26 DIAGNOSIS — D71 Functional disorders of polymorphonuclear neutrophils: Secondary | ICD-10-CM

## 2018-07-26 DIAGNOSIS — E039 Hypothyroidism, unspecified: Secondary | ICD-10-CM | POA: Diagnosis not present

## 2018-07-26 LAB — COMPREHENSIVE METABOLIC PANEL
ALT: 17 U/L (ref 0–35)
AST: 19 U/L (ref 0–37)
Albumin: 4.3 g/dL (ref 3.5–5.2)
Alkaline Phosphatase: 81 U/L (ref 39–117)
BUN: 17 mg/dL (ref 6–23)
CO2: 30 mEq/L (ref 19–32)
Calcium: 9.4 mg/dL (ref 8.4–10.5)
Chloride: 98 mEq/L (ref 96–112)
Creatinine, Ser: 0.84 mg/dL (ref 0.40–1.20)
GFR: 70.6 mL/min (ref >60.00)
Glucose, Bld: 91 mg/dL (ref 70–99)
Potassium: 4.5 mEq/L (ref 3.5–5.1)
Sodium: 137 mEq/L (ref 135–145)
Total Bilirubin: 0.6 mg/dL (ref 0.2–1.2)
Total Protein: 7.3 g/dL (ref 6.0–8.3)

## 2018-07-26 LAB — LIPID PANEL
Cholesterol: 231 mg/dL — ABNORMAL HIGH (ref 0–200)
HDL: 63.6 mg/dL (ref >39.00)
LDL Cholesterol: 148 mg/dL — ABNORMAL HIGH (ref 0–99)
NonHDL: 167.85
Total CHOL/HDL Ratio: 4
Triglycerides: 99 mg/dL (ref 0.0–149.0)
VLDL: 19.8 mg/dL (ref 0.0–40.0)

## 2018-07-26 LAB — CBC WITH DIFFERENTIAL/PLATELET
Basophils Absolute: 0.1 10*3/uL (ref 0.0–0.1)
Basophils Relative: 1.3 % (ref 0.0–3.0)
Eosinophils Absolute: 0.4 10*3/uL (ref 0.0–0.7)
Eosinophils Relative: 5.9 % — ABNORMAL HIGH (ref 0.0–5.0)
HCT: 40.1 % (ref 36.0–46.0)
Hemoglobin: 13.6 g/dL (ref 12.0–15.0)
Lymphocytes Relative: 27.8 % (ref 12.0–46.0)
Lymphs Abs: 2 10*3/uL (ref 0.7–4.0)
MCHC: 34 g/dL (ref 30.0–36.0)
MCV: 92.5 fl (ref 78.0–100.0)
Monocytes Absolute: 0.7 10*3/uL (ref 0.1–1.0)
Monocytes Relative: 9.2 % (ref 3.0–12.0)
Neutro Abs: 4 10*3/uL (ref 1.4–7.7)
Neutrophils Relative %: 55.8 % (ref 43.0–77.0)
Platelets: 304 10*3/uL (ref 150.0–400.0)
RBC: 4.33 Mil/uL (ref 3.87–5.11)
RDW: 13.2 % (ref 11.5–15.5)
WBC: 7.2 10*3/uL (ref 4.0–10.5)

## 2018-07-26 LAB — TSH: TSH: 1.73 u[IU]/mL (ref 0.35–4.50)

## 2018-07-26 MED ORDER — LEVOTHYROXINE SODIUM 50 MCG PO TABS: ORAL_TABLET | ORAL | 4 refills | Status: DC

## 2018-07-26 NOTE — Patient Instructions (Addendum)
Limit your sodium (Salt) intake    It is important that you exercise regularly, at least 20 minutes 3 to 4 times per week.  If you develop chest pain or shortness of breath seek  medical attention.  Return in 6 months for follow-up  Pulmonary follow-up as scheduled Neurology follow-up as scheduled    Health Maintenance for Postmenopausal Women Menopause is a normal process in which your reproductive ability comes to an end. This process happens gradually over a span of months to years, usually between the ages of 17 and 57. Menopause is complete when you have missed 12 consecutive menstrual periods. It is important to talk with your health care provider about some of the most common conditions that affect postmenopausal women, such as heart disease, cancer, and bone loss (osteoporosis). Adopting a healthy lifestyle and getting preventive care can help to promote your health and wellness. Those actions can also lower your chances of developing some of these common conditions. What should I know about menopause? During menopause, you may experience a number of symptoms, such as:  Moderate-to-severe hot flashes.  Night sweats.  Decrease in sex drive.  Mood swings.  Headaches.  Tiredness.  Irritability.  Memory problems.  Insomnia.  Choosing to treat or not to treat menopausal changes is an individual decision that you make with your health care provider. What should I know about hormone replacement therapy and supplements? Hormone therapy products are effective for treating symptoms that are associated with menopause, such as hot flashes and night sweats. Hormone replacement carries certain risks, especially as you become older. If you are thinking about using estrogen or estrogen with progestin treatments, discuss the benefits and risks with your health care provider. What should I know about heart disease and stroke? Heart disease, heart attack, and stroke become more likely as  you age. This may be due, in part, to the hormonal changes that your body experiences during menopause. These can affect how your body processes dietary fats, triglycerides, and cholesterol. Heart attack and stroke are both medical emergencies. There are many things that you can do to help prevent heart disease and stroke:  Have your blood pressure checked at least every 1-2 years. High blood pressure causes heart disease and increases the risk of stroke.  If you are 26-1 years old, ask your health care provider if you should take aspirin to prevent a heart attack or a stroke.  Do not use any tobacco products, including cigarettes, chewing tobacco, or electronic cigarettes. If you need help quitting, ask your health care provider.  It is important to eat a healthy diet and maintain a healthy weight. ? Be sure to include plenty of vegetables, fruits, low-fat dairy products, and lean protein. ? Avoid eating foods that are high in solid fats, added sugars, or salt (sodium).  Get regular exercise. This is one of the most important things that you can do for your health. ? Try to exercise for at least 150 minutes each week. The type of exercise that you do should increase your heart rate and make you sweat. This is known as moderate-intensity exercise. ? Try to do strengthening exercises at least twice each week. Do these in addition to the moderate-intensity exercise.  Know your numbers.Ask your health care provider to check your cholesterol and your blood glucose. Continue to have your blood tested as directed by your health care provider.  What should I know about cancer screening? There are several types of cancer. Take the following  steps to reduce your risk and to catch any cancer development as early as possible. Breast Cancer  Practice breast self-awareness. ? This means understanding how your breasts normally appear and feel. ? It also means doing regular breast self-exams. Let your  health care provider know about any changes, no matter how small.  If you are 22 or older, have a clinician do a breast exam (clinical breast exam or CBE) every year. Depending on your age, family history, and medical history, it may be recommended that you also have a yearly breast X-ray (mammogram).  If you have a family history of breast cancer, talk with your health care provider about genetic screening.  If you are at high risk for breast cancer, talk with your health care provider about having an MRI and a mammogram every year.  Breast cancer (BRCA) gene test is recommended for women who have family members with BRCA-related cancers. Results of the assessment will determine the need for genetic counseling and BRCA1 and for BRCA2 testing. BRCA-related cancers include these types: ? Breast. This occurs in males or females. ? Ovarian. ? Tubal. This may also be called fallopian tube cancer. ? Cancer of the abdominal or pelvic lining (peritoneal cancer). ? Prostate. ? Pancreatic.  Cervical, Uterine, and Ovarian Cancer Your health care provider may recommend that you be screened regularly for cancer of the pelvic organs. These include your ovaries, uterus, and vagina. This screening involves a pelvic exam, which includes checking for microscopic changes to the surface of your cervix (Pap test).  For women ages 21-65, health care providers may recommend a pelvic exam and a Pap test every three years. For women ages 92-65, they may recommend the Pap test and pelvic exam, combined with testing for human papilloma virus (HPV), every five years. Some types of HPV increase your risk of cervical cancer. Testing for HPV may also be done on women of any age who have unclear Pap test results.  Other health care providers may not recommend any screening for nonpregnant women who are considered low risk for pelvic cancer and have no symptoms. Ask your health care provider if a screening pelvic exam is right  for you.  If you have had past treatment for cervical cancer or a condition that could lead to cancer, you need Pap tests and screening for cancer for at least 20 years after your treatment. If Pap tests have been discontinued for you, your risk factors (such as having a new sexual partner) need to be reassessed to determine if you should start having screenings again. Some women have medical problems that increase the chance of getting cervical cancer. In these cases, your health care provider may recommend that you have screening and Pap tests more often.  If you have a family history of uterine cancer or ovarian cancer, talk with your health care provider about genetic screening.  If you have vaginal bleeding after reaching menopause, tell your health care provider.  There are currently no reliable tests available to screen for ovarian cancer.  Lung Cancer Lung cancer screening is recommended for adults 47-57 years old who are at high risk for lung cancer because of a history of smoking. A yearly low-dose CT scan of the lungs is recommended if you:  Currently smoke.  Have a history of at least 30 pack-years of smoking and you currently smoke or have quit within the past 15 years. A pack-year is smoking an average of one pack of cigarettes per day for  one year.  Yearly screening should:  Continue until it has been 15 years since you quit.  Stop if you develop a health problem that would prevent you from having lung cancer treatment.  Colorectal Cancer  This type of cancer can be detected and can often be prevented.  Routine colorectal cancer screening usually begins at age 23 and continues through age 72.  If you have risk factors for colon cancer, your health care provider may recommend that you be screened at an earlier age.  If you have a family history of colorectal cancer, talk with your health care provider about genetic screening.  Your health care provider may also  recommend using home test kits to check for hidden blood in your stool.  A small camera at the end of a tube can be used to examine your colon directly (sigmoidoscopy or colonoscopy). This is done to check for the earliest forms of colorectal cancer.  Direct examination of the colon should be repeated every 5-10 years until age 89. However, if early forms of precancerous polyps or small growths are found or if you have a family history or genetic risk for colorectal cancer, you may need to be screened more often.  Skin Cancer  Check your skin from head to toe regularly.  Monitor any moles. Be sure to tell your health care provider: ? About any new moles or changes in moles, especially if there is a change in a mole's shape or color. ? If you have a mole that is larger than the size of a pencil eraser.  If any of your family members has a history of skin cancer, especially at a young age, talk with your health care provider about genetic screening.  Always use sunscreen. Apply sunscreen liberally and repeatedly throughout the day.  Whenever you are outside, protect yourself by wearing long sleeves, pants, a wide-brimmed hat, and sunglasses.  What should I know about osteoporosis? Osteoporosis is a condition in which bone destruction happens more quickly than new bone creation. After menopause, you may be at an increased risk for osteoporosis. To help prevent osteoporosis or the bone fractures that can happen because of osteoporosis, the following is recommended:  If you are 56-47 years old, get at least 1,000 mg of calcium and at least 600 mg of vitamin D per day.  If you are older than age 50 but younger than age 35, get at least 1,200 mg of calcium and at least 600 mg of vitamin D per day.  If you are older than age 53, get at least 1,200 mg of calcium and at least 800 mg of vitamin D per day.  Smoking and excessive alcohol intake increase the risk of osteoporosis. Eat foods that are  rich in calcium and vitamin D, and do weight-bearing exercises several times each week as directed by your health care provider. What should I know about how menopause affects my mental health? Depression may occur at any age, but it is more common as you become older. Common symptoms of depression include:  Low or sad mood.  Changes in sleep patterns.  Changes in appetite or eating patterns.  Feeling an overall lack of motivation or enjoyment of activities that you previously enjoyed.  Frequent crying spells.  Talk with your health care provider if you think that you are experiencing depression. What should I know about immunizations? It is important that you get and maintain your immunizations. These include:  Tetanus, diphtheria, and pertussis (Tdap) booster  vaccine.  Influenza every year before the flu season begins.  Pneumonia vaccine.  Shingles vaccine.  Your health care provider may also recommend other immunizations. This information is not intended to replace advice given to you by your health care provider. Make sure you discuss any questions you have with your health care provider. Document Released: 02/05/2006 Document Revised: 07/03/2016 Document Reviewed: 09/17/2015 Elsevier Interactive Patient Education  2018 [employer].

## 2018-07-26 NOTE — Progress Notes (Signed)
Subjective:    Patient ID: Grace Schmitt, female    DOB: 1946/12/09, 72 y.o.   MRN: [id number]  HPI  72 year old patient who is seen today for an annual preventive health examination as well as a subsequent Medicare wellness visit  Since her last annual exam she was hospitalized about 11 months ago for encephalopathy secondary to hypercalcemia related to sarcoidosis.  She was treated briefly with prednisone and has done well off therapy.  Most recent calcium levels have normalized.  She is followed closely by pulmonary medicine and is scheduled for follow-up next month with repeat PFTs She is followed by [facility] neurology with a history of restless leg syndrome.  She remains on combination of methadone benzodiazepine at bedtime for symptom control  She has had a recent eye examination Up-to-date on colonoscopies  Past Medical History:  Diagnosis Date  . Allergy    prn claritin  . Depression   . Fibromyalgia   . Headache    hx of none since menopause  . Hx of colonic polyps serrated and adenomatous 05/28/2005  . Hyperlipidemia   . Hypothyroidism   . Ischemic chest pain   . PONV (postoperative nausea and vomiting)   . Restless leg syndrome   . Sarcoidosis    lung mass  . Thyroid disease      Social History   Socioeconomic History  . Marital status: Married    Spouse name: Not on file  . Number of children: Not on file  . Years of education: Not on file  . Highest education level: Not on file  Occupational History  . Not on file  Social Needs  . Financial resource strain: Not on file  . Food insecurity:    Worry: Not on file    Inability: Not on file  . Transportation needs:    Medical: Not on file    Non-medical: Not on file  Tobacco Use  . Smoking status: Never Smoker  . Smokeless tobacco: Never Used  Substance and Sexual Activity  . Alcohol use: No    Alcohol/week: 0.0 oz  . Drug use: No  . Sexual activity: Not Currently  Lifestyle  . Physical activity:      Days per week: Not on file    Minutes per session: Not on file  . Stress: Not on file  Relationships  . Social connections:    Talks on phone: Not on file    Gets together: Not on file    Attends religious service: Not on file    Active member of club or organization: Not on file    Attends meetings of clubs or organizations: Not on file    Relationship status: Not on file  . Intimate partner violence:    Fear of current or ex partner: Not on file    Emotionally abused: Not on file    Physically abused: Not on file    Forced sexual activity: Not on file  Other Topics Concern  . Not on file  Social History Narrative  . Not on file    Past Surgical History:  Procedure Laterality Date  . COLONOSCOPY  October 2011  . ENDOBRONCHIAL ULTRASOUND Bilateral 09/20/2017   Procedure: ENDOBRONCHIAL ULTRASOUND;  Surgeon: Juanito Doom, MD;  Location: [facility] ENDOSCOPY;  Service: Cardiopulmonary;  Laterality: Bilateral;  . TONSILLECTOMY  1951  . TONSILLECTOMY    . TUBAL LIGATION  1978   x2    Family History  Problem Relation Age of Onset  .  Dementia Mother   . Heart disease Mother        smal vessel disease  . Hypertension Brother   . Colon cancer Neg Hx   . Colon polyps Neg Hx   . Breast cancer Neg Hx     Allergies  Allergen Reactions  . Phenergan [Promethazine Hcl] Other (See Comments)    Pt has restless leg syndrome and the phenergan cause the RLS  to worsen     Current Outpatient Medications on File Prior to Visit  Medication Sig Dispense Refill  . aspirin EC 81 MG tablet Take 81 mg by mouth at bedtime.    . Azelaic Acid 15 % cream After skin is thoroughly washed and patted dry, gently but thoroughly massage a thin film of azelaic acid cream into the affected area twice daily, in the morning and evening. (Patient taking differently: Apply 1 application topically 2 (two) times daily as needed (rosacea.). After skin is thoroughly washed and patted dry, gently but thoroughly  massage a thin film of azelaic acid cream into the affected area twice daily, in the morning and evening as needed for rosacea) 50 g 4  . co-enzyme Q-10 30 MG capsule Take by mouth daily.     Marland Kitchen Dextrose-Fructose-Sod Citrate (NAUZENE) [id number] MG CHEW Chew 2 tablets by mouth 2 (two) times daily as needed (nausea/vomiting).    Marland Kitchen loratadine (CLARITIN) 10 MG tablet Take 10 mg by mouth at bedtime.     Marland Kitchen LORazepam (ATIVAN) 0.5 MG tablet Take 0.25-0.5 mg by mouth at bedtime as needed for anxiety (for restless leg/sleep.).    Marland Kitchen Menthol, Topical Analgesic, (STOPAIN EX) Apply 1 spray topically 3 (three) times daily as needed (for legs pain).    . methadone (DOLOPHINE) 5 MG tablet Take 7.5 mg by mouth daily at 6 PM. For restless legs    . Multiple Vitamin (MULTIVITAMIN WITH MINERALS) TABS tablet Take 1 tablet by mouth 2 (two) times daily.    . sennosides-docusate sodium (SENOKOT-S) 8.6-50 MG tablet Take 1 tablet by mouth 2 (two) times daily as needed for constipation.     . TURMERIC PO Take by mouth.    . Wheat Dextrin (BENEFIBER PO) Take 1 Dose by mouth at bedtime.     . DULoxetine (CYMBALTA) 30 MG capsule Take 60 mg by mouth daily with breakfast.      No current facility-administered medications on file prior to visit.     BP (!) 118/58 (BP Location: Right Arm, Patient Position: Sitting, Cuff Size: Large)   Pulse 70   Temp 98.6 F (37 C) (Oral)   Ht 5' 2.5" (1.588 m)   Wt 174 lb 12.8 oz (79.3 kg)   SpO2 97%   BMI 31.46 kg/m   Subsequent Medicare wellness visit  1. Risk factors, based on past  M,S,F history.  Cardiovascular risk factors include a history of mild dyslipidemia  2.  Physical activities: Remains very active with walking but no rigorous exercise regimen  3.  Depression/mood: No history major depression.  Is on benzodiazepines for treatment of restless leg syndrome  4.  Hearing: No deficits  5.  ADL's: Independent  6.  Fall risk: Moderately elevated due to high risk  medication use  7.  Home safety: No problems identified  8.  Height weight, and visual acuity; height and weight stable no change in visual acuity.  Has had a recent eye examination this year  9.  Counseling: Modest weight loss and more intensive aerobic activities encouraged  10. Lab orders based on risk factors: We will review laboratory update with special attention to calcium and TSH  11. Referral : Follow-up pulmonary medicine and neurology  12. Care plan: Continue efforts at aggressive risk factor modification.  More regular exercise encouraged  13. Cognitive assessment: Alert and appropriate normal affect.  No cognitive dysfunction  14. Screening: Patient provided with a written and personalized 5-10 year screening schedule in the AVS.    15. Provider List Update: Primary care ophthalmology neurology pulmonary medicine and OB/GYN    Review of Systems  Constitutional: Positive for fatigue.  HENT: Negative for congestion, dental problem, hearing loss, rhinorrhea, sinus pressure, sore throat and tinnitus.   Eyes: Negative for pain, discharge and visual disturbance.  Respiratory: Negative for cough and shortness of breath.   Cardiovascular: Negative for chest pain, palpitations and leg swelling.  Gastrointestinal: Negative for abdominal distention, abdominal pain, blood in stool, constipation, diarrhea, nausea and vomiting.  Genitourinary: Negative for difficulty urinating, dysuria, flank pain, frequency, hematuria, pelvic pain, urgency, vaginal bleeding, vaginal discharge and vaginal pain.  Musculoskeletal: Negative for arthralgias, gait problem and joint swelling.  Skin: Negative for rash.  Neurological: Positive for weakness. Negative for dizziness, syncope, speech difficulty, numbness and headaches.  Hematological: Negative for adenopathy.  Psychiatric/Behavioral: Negative for agitation, behavioral problems and dysphoric mood. The patient is not nervous/anxious.          Objective:   Physical Exam  Constitutional: She is oriented to person, place, and time. She appears well-developed and well-nourished.  HENT:  Head: Normocephalic and atraumatic.  Right Ear: External ear normal.  Left Ear: External ear normal.  Mouth/Throat: Oropharynx is clear and moist.  Eyes: Conjunctivae and EOM are normal.  Neck: Normal range of motion. Neck supple. No JVD present. No thyromegaly present.  Cardiovascular: Normal rate, regular rhythm, normal heart sounds and intact distal pulses.  No murmur heard. Pulmonary/Chest: Effort normal and breath sounds normal. She has no wheezes. She has no rales.  Abdominal: Soft. Bowel sounds are normal. She exhibits no distension and no mass. There is no tenderness. There is no rebound and no guarding.  Genitourinary: Vagina normal.  Musculoskeletal: Normal range of motion. She exhibits no edema or tenderness.  Neurological: She is alert and oriented to person, place, and time. She has normal reflexes. She displays normal reflexes. No cranial nerve deficit. She exhibits normal muscle tone. Coordination normal.  Skin: Skin is warm and dry. No rash noted.  Psychiatric: She has a normal mood and affect. Her behavior is normal.          Assessment & Plan:   Preventive health examination Subsequent Medicare wellness visit Mild dyslipidemia.  Will review a lipid profile this has improved over the years lifestyle issues discussed.  Modest weight loss encouraged.  The patient is participating with a weight watchers program presently will intensify her aerobic activities Sarcoidosis.  Pulmonary follow-up Restless leg syndrome.  Follow-up neurology  Return here in 6 months for follow-up with a new provider  Marletta Lor

## 2018-08-03 ENCOUNTER — Ambulatory Visit (INDEPENDENT_AMBULATORY_CARE_PROVIDER_SITE_OTHER): Payer: PPO | Admitting: Pulmonary Disease

## 2018-08-03 ENCOUNTER — Encounter: Payer: Self-pay | Admitting: Pulmonary Disease

## 2018-08-03 ENCOUNTER — Ambulatory Visit: Payer: PPO | Admitting: Pulmonary Disease

## 2018-08-03 VITALS — BP 124/84 | HR 71 | Ht 62.75 in | Wt 174.0 lb

## 2018-08-03 DIAGNOSIS — R400 Somnolence: Secondary | ICD-10-CM | POA: Diagnosis not present

## 2018-08-03 DIAGNOSIS — D860 Sarcoidosis of lung: Secondary | ICD-10-CM

## 2018-08-03 LAB — PULMONARY FUNCTION TEST
DL/VA % pred: 105 %
DL/VA: 4.91 ml/min/mmHg/L
DLCO cor % pred: 80 %
DLCO cor: 18.23 ml/min/mmHg
DLCO unc % pred: 81 %
DLCO unc: 18.34 ml/min/mmHg
FEF 25-75 Post: 2.73 L/sec
FEF 25-75 Pre: 2.11 L/sec
FEF2575-%Change-Post: 29 %
FEF2575-%Pred-Post: 163 %
FEF2575-%Pred-Pre: 126 %
FEV1-%Change-Post: 4 %
FEV1-%Pred-Post: 91 %
FEV1-%Pred-Pre: 87 %
FEV1-Post: 1.88 L
FEV1-Pre: 1.8 L
FEV1FVC-%Change-Post: 3 %
FEV1FVC-%Pred-Pre: 112 %
FEV6-%Change-Post: 1 %
FEV6-%Pred-Post: 83 %
FEV6-%Pred-Pre: 82 %
FEV6-Post: 2.16 L
FEV6-Pre: 2.14 L
FEV6FVC-%Pred-Post: 105 %
FEV6FVC-%Pred-Pre: 105 %
FVC-%Change-Post: 1 %
FVC-%Pred-Post: 79 %
FVC-%Pred-Pre: 78 %
FVC-Post: 2.16 L
FVC-Pre: 2.14 L
Post FEV1/FVC ratio: 87 %
Post FEV6/FVC ratio: 100 %
Pre FEV1/FVC ratio: 84 %
Pre FEV6/FVC Ratio: 100 %
RV % pred: 89 %
RV: 1.95 L
TLC % pred: 93 %
TLC: 4.54 L

## 2018-08-03 NOTE — Progress Notes (Signed)
PFT completed today.  

## 2018-08-03 NOTE — Patient Instructions (Signed)
Sleepiness, feeling "in a fog" We will arrange for a home sleep study If that shows sleep apnea and the need to talk to your neurologist at [facility] about this  Pulmonary sarcoidosis: Today's lung function testing showed improvement compared to before At this time I have no evidence that this is worsened  We will plan on seeing you back in 6 months or sooner if needed

## 2018-08-03 NOTE — Progress Notes (Signed)
Subjective:    Patient ID: Grace Schmitt, female    DOB: [birth year], 72 y.o.   MRN: [id number]  Synopsis: Sarcoidosis. Referred in the fall 2018 for evaluation of mediastinal lymphadenopathy discovered in the midst of symptomatic hypercalcemia and some pulmonary nodules.  Underwent an endobronchial ultrasound-guided needle aspiration of mediastinal lymphadenopathy and found to have noncaseating granulomas.  She was treated with prednisone in 2018 with resolution of hypercalcemia and symptoms.  She also has fibromyalgia.  HPI Chief Complaint  Patient presents with  . Sarcoidosis    Per pt, breathing is unchanged from last OV. Denies chest tightnes, wheezing, SOB or coughing at this time. PFT was completed today.   Grace Schmitt says that she has ongoing symptoms of "brain fog" and doesn't feel as clear mentally as she is used to.  She feels sleepy a lot.  She says that she was trying to text her daughter recently and couldn't stay awake long enough to maintain.  She has never had trouble with sleeping in the past.   She has been retaining fluid lately and had a hard time getting her rings off recently.  She says that she had problems to the point that she needed to take a fluid pill.   Past Medical History:  Diagnosis Date  . Allergy    prn claritin  . Depression   . Fibromyalgia   . Headache    hx of none since menopause  . Hx of colonic polyps serrated and adenomatous 05/28/2005  . Hyperlipidemia   . Hypothyroidism   . Ischemic chest pain   . PONV (postoperative nausea and vomiting)   . Restless leg syndrome   . Sarcoidosis    lung mass  . Thyroid disease         Review of Systems  Constitutional: Negative for fever and unexpected weight change.  HENT: Negative for nosebleeds, postnasal drip, rhinorrhea, sinus pressure, sneezing and sore throat.   Respiratory: Negative for cough, chest tightness, shortness of breath and wheezing.   Cardiovascular: Negative for palpitations  and leg swelling.  Gastrointestinal: Negative for vomiting.       Objective:   Physical Exam Vitals:   08/03/18 1014  BP: 124/84  Pulse: 71  SpO2: 97%  Weight: 174 lb (78.9 kg)  Height: 5' 2.75" (1.594 m)   RA  Gen: well appearing HENT: OP clear, TM's clear, neck supple, neck size 15 inches, MMS 4 PULM: CTA B, normal percussion CV: RRR, no mgr, trace edema GI: BS+, soft, nontender Derm: no cyanosis or rash Psyche: normal mood and affect    CBC    Component Value Date/Time   WBC 7.2 07/26/2018 1019   RBC 4.33 07/26/2018 1019   HGB 13.6 07/26/2018 1019   HCT 40.1 07/26/2018 1019   PLT 304.0 07/26/2018 1019   MCV 92.5 07/26/2018 1019   MCH 29.5 08/21/2017 2225   MCHC 34.0 07/26/2018 1019   RDW 13.2 07/26/2018 1019   LYMPHSABS 2.0 07/26/2018 1019   MONOABS 0.7 07/26/2018 1019   EOSABS 0.4 07/26/2018 1019   BASOSABS 0.1 07/26/2018 1019   BMET    Component Value Date/Time   NA 137 07/26/2018 1019   K 4.5 07/26/2018 1019   CL 98 07/26/2018 1019   CO2 30 07/26/2018 1019   GLUCOSE 91 07/26/2018 1019   BUN 17 07/26/2018 1019   CREATININE 0.84 07/26/2018 1019   CALCIUM 9.4 07/26/2018 1019   CALCIUM 11.4 (H) 08/14/2017 0056   GFRNONAA >60 08/21/2017 2225  GFRAA >60 08/21/2017 2225   Other imaging: MRI brain August 2018 showed no evidence of a stroke  Chest imaging: August 2018 CT chest images independently reviewed showing upper lobe predominant bronchovascular distributed pulmonary nodules, there is one predominant nodule in the right middle lobe which is about 1.1 cm in size. There is also significant mediastinal lymphadenopathy.  Echocardiogram: August 2018 transthoracic echocardiogram showing normal LVEF, grade 1 diastolic dysfunction  Pulmonary function testing: January 2019 ratio normal, FEV1 1.74 L, FVC 1.97 L 71% predicted, total lung capacity 3.76 L 77% predicted, DLCO 16.54 mL 73% predicted August 2019 ratio normal, FVC 2.2 L 79% predicted, total  lung capacity 4.5 L 93% predicted, DLCO 18.3 mL 81% predicted  Micro: September 2018 BAL: Mucor species January 2018 Beta-D-glucan negative, Galactomannan negative  Pathology: September 22, 2017 granulomatous inflammation on endobronchial biopsy and lymph node needle aspiration  Labs: August 2018 in the midst of hypercalcemia: 11 pg per mL (low), PTH related peptide was negative, 1 25-hydroxy vitamin D normal 02/2018 BMET: Calcium 9.3 mg/dL     Assessment & Plan:  Daytime somnolence - Plan: Home sleep test  Pulmonary sarcoidosis [facility])  Discussion: Today's lung function testing is actually better compared to the one from January.  Given her lack of respiratory symptoms and the significant improvement in lung function testing I have no evidence that pulmonary sarcoidosis has worsened.  However, she describes sleepiness and feeling "in a fog" all the time.  I explained to her that she is on at least 3 medicines which could contribute to this and her methadone, Claritin, and lorazepam.  She also notes significant sleepiness and has physical exam findings which are highly worrisome for obstructive sleep apnea so I think she needs to have a home sleep study.  If it turns out that she does have sleep apnea she should probably go back to her neurologist at [facility] who apparently is also a sleep specialist.  Plan: Sleepiness, feeling "in a fog" We will arrange for a home sleep study If that shows sleep apnea and the need to talk to your neurologist at [facility] about this  Pulmonary sarcoidosis: Today's lung function testing showed improvement compared to before At this time I have no evidence that this is worsened  We will plan on seeing you back in 6 months or sooner if needed  > 50% of this 25 min visit spent face to face     Current Outpatient Medications:  .  aspirin EC 81 MG tablet, Take 81 mg by mouth at bedtime., Disp: , Rfl:  .  Azelaic Acid 15 % cream, After skin is thoroughly  washed and patted dry, gently but thoroughly massage a thin film of azelaic acid cream into the affected area twice daily, in the morning and evening. (Patient taking differently: Apply 1 application topically 2 (two) times daily as needed (rosacea.). After skin is thoroughly washed and patted dry, gently but thoroughly massage a thin film of azelaic acid cream into the affected area twice daily, in the morning and evening as needed for rosacea), Disp: 50 g, Rfl: 4 .  co-enzyme Q-10 30 MG capsule, Take by mouth daily. , Disp: , Rfl:  .  Dextrose-Fructose-Sod Citrate (NAUZENE) [phone] MG CHEW, Chew 2 tablets by mouth 2 (two) times daily as needed (nausea/vomiting)., Disp: , Rfl:  .  levothyroxine (SYNTHROID, LEVOTHROID) 50 MCG tablet, TAKE 1 TABLET (50 MCG TOTAL) BY MOUTH DAILY., Disp: 90 tablet, Rfl: 4 .  loratadine (CLARITIN) 10 MG tablet,  Take 10 mg by mouth at bedtime. , Disp: , Rfl:  .  LORazepam (ATIVAN) 0.5 MG tablet, Take 0.25-0.5 mg by mouth at bedtime as needed for anxiety (for restless leg/sleep.)., Disp: , Rfl:  .  Menthol, Topical Analgesic, (STOPAIN EX), Apply 1 spray topically 3 (three) times daily as needed (for legs pain)., Disp: , Rfl:  .  methadone (DOLOPHINE) 5 MG tablet, Take 7.5 mg by mouth daily at 6 PM. For restless legs, Disp: , Rfl:  .  Multiple Vitamin (MULTIVITAMIN WITH MINERALS) TABS tablet, Take 1 tablet by mouth 2 (two) times daily., Disp: , Rfl:  .  sennosides-docusate sodium (SENOKOT-S) 8.6-50 MG tablet, Take 1 tablet by mouth 2 (two) times daily as needed for constipation. , Disp: , Rfl:  .  TURMERIC PO, Take by mouth., Disp: , Rfl:  .  Wheat Dextrin (BENEFIBER PO), Take 1 Dose by mouth at bedtime. , Disp: , Rfl:  .  DULoxetine (CYMBALTA) 30 MG capsule, Take 60 mg by mouth daily with breakfast. , Disp: , Rfl:

## 2018-08-18 DIAGNOSIS — G4733 Obstructive sleep apnea (adult) (pediatric): Secondary | ICD-10-CM | POA: Diagnosis not present

## 2018-08-19 ENCOUNTER — Telehealth: Payer: Self-pay | Admitting: Pulmonary Disease

## 2018-08-19 ENCOUNTER — Other Ambulatory Visit: Payer: Self-pay | Admitting: *Deleted

## 2018-08-19 DIAGNOSIS — R400 Somnolence: Secondary | ICD-10-CM

## 2018-08-19 DIAGNOSIS — G4733 Obstructive sleep apnea (adult) (pediatric): Secondary | ICD-10-CM | POA: Diagnosis not present

## 2018-08-19 NOTE — Telephone Encounter (Signed)
Mild obstructive Sleep Apnea with significant day time symptoms.  Treatment options for this degree of sleep disorder includes weight loss and cpap therapy.  Do not drive while sleepy or while on medications with sedative side effects. Auto cpap setting 5-15   I have called and Spoke with the patient who has verbalized understanding and would like to start on the CPAP I have sent in the order to the [facility] and follow up appointment made.  Forward to doctor Mcquiad as and fyi.

## 2018-08-22 NOTE — Telephone Encounter (Signed)
She needs to let her team at [facility] know

## 2018-08-22 NOTE — Telephone Encounter (Signed)
Called and spoke with patient regarding BQ recommendations Advised pt that her DME for CPAP machine is [employer],  she should here from them this week to set up an appt for new start Pt verbalized understanding and had no further questions Nothing further needed.

## 2018-08-24 NOTE — Telephone Encounter (Signed)
Patient's SS has not been scanned into her chart yet. Will fax a copy over to Dr. Lisabeth Devoid office as soon as this is done. Fax number is [phone].

## 2018-08-27 IMAGING — MG DIGITAL SCREENING BILATERAL MAMMOGRAM WITH TOMO AND CAD
8 series · 8 of 24 positions shown · non-contrast
Comparison: Previous exam(s).

CLINICAL DATA: Screening.

EXAM:
DIGITAL SCREENING BILATERAL MAMMOGRAM WITH TOMO AND CAD

[R CC synth-2D]
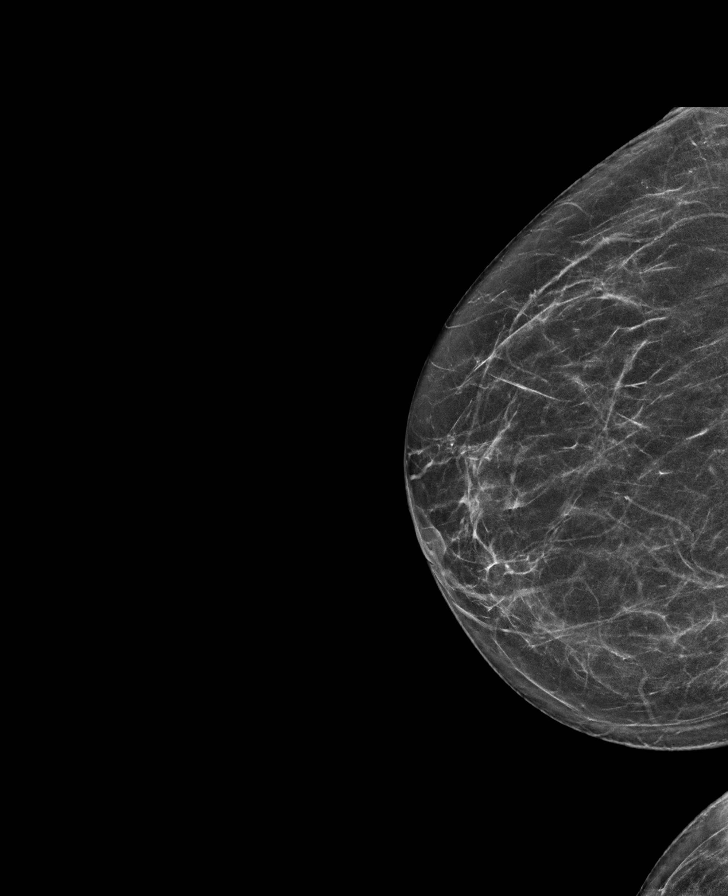

[L CC synth-2D]
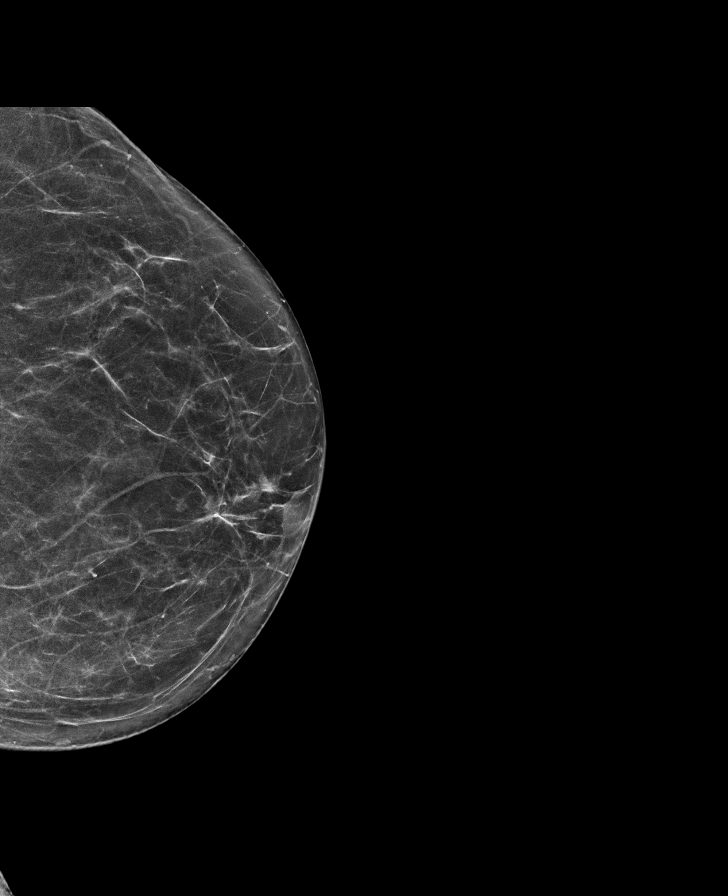

[L MLO synth-2D]
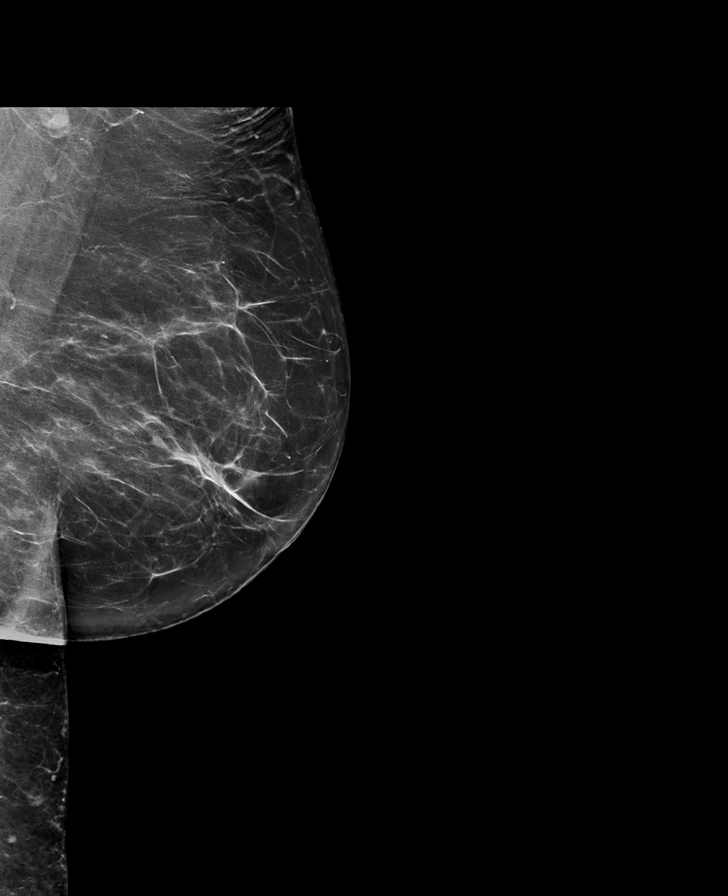

[R MLO synth-2D]
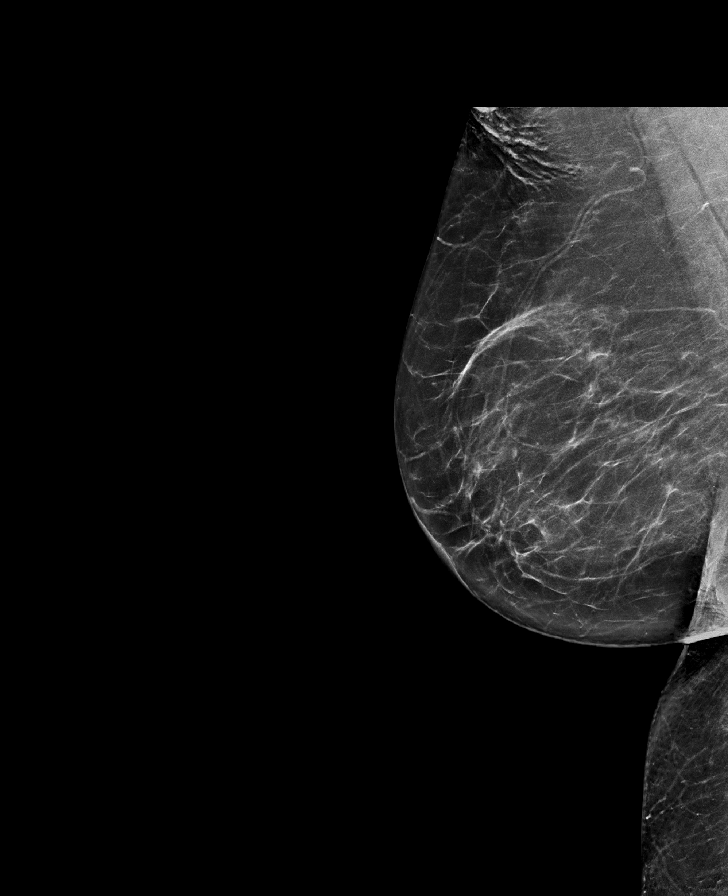

[R MLO tomo · tomo slice 39/78.0]
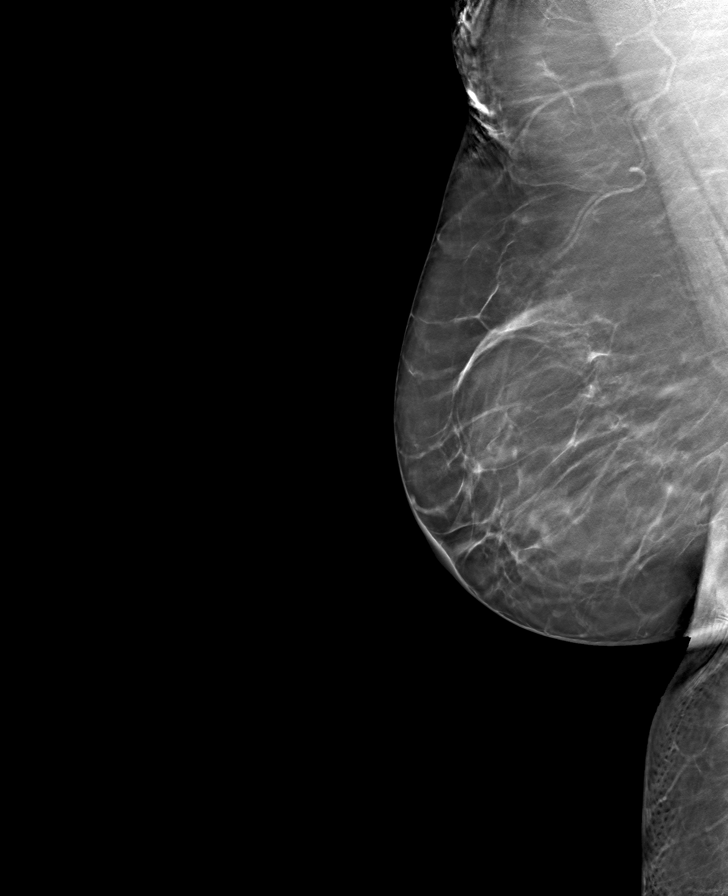

[L CC tomo · tomo slice 33/65.0]
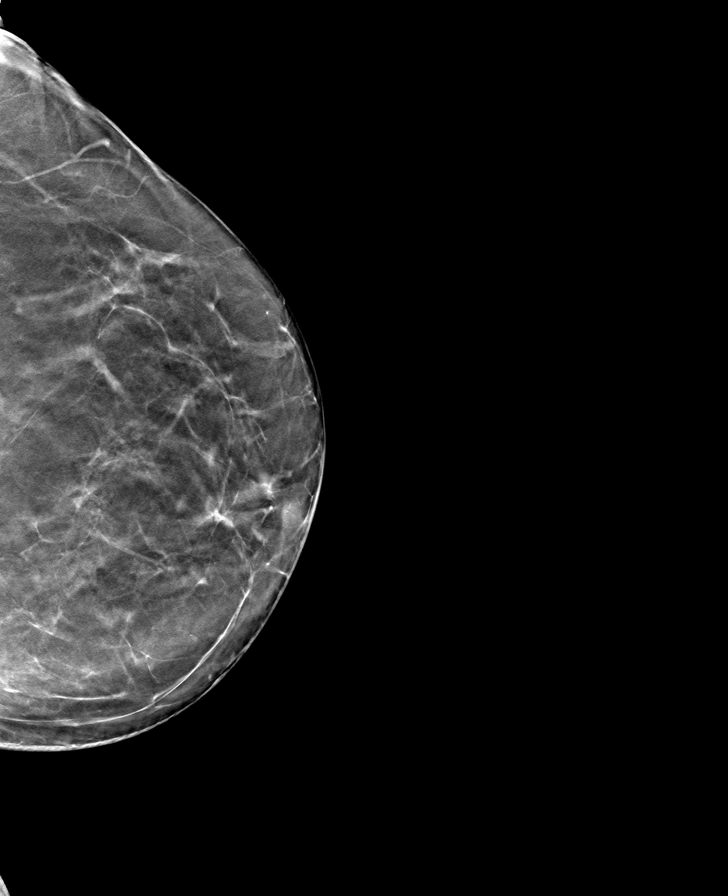

[L MLO tomo · tomo slice 39/78.0]
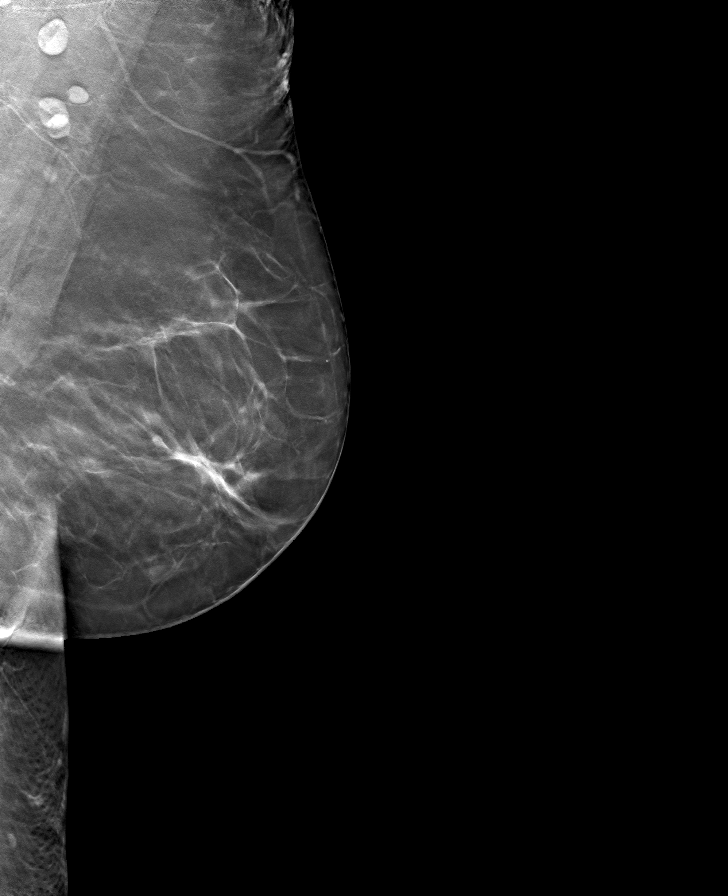

[R CC tomo · tomo slice 33/64.0]
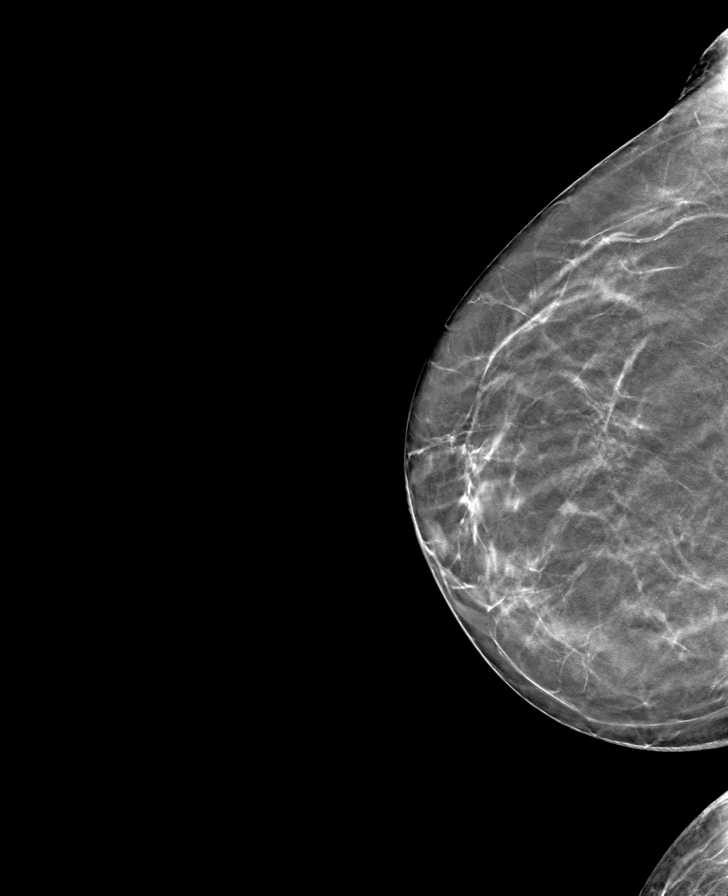

[8 of 24 positions shown; findings below may reference images not displayed]

ACR Breast Density Category b: There are scattered areas of
fibroglandular density.
FINDINGS: There are no findings suspicious for malignancy. Images were
processed with CAD.
IMPRESSION: No mammographic evidence of malignancy. A result letter of this
screening mammogram will be mailed directly to the patient.

RECOMMENDATION:
Screening mammogram in one year. (Code:[id number])

BI-RADS CATEGORY  1: Negative.

## 2018-09-06 DIAGNOSIS — M797 Fibromyalgia: Secondary | ICD-10-CM | POA: Diagnosis not present

## 2018-09-06 DIAGNOSIS — G2581 Restless legs syndrome: Secondary | ICD-10-CM | POA: Diagnosis not present

## 2018-09-07 DIAGNOSIS — G4733 Obstructive sleep apnea (adult) (pediatric): Secondary | ICD-10-CM | POA: Diagnosis not present

## 2018-10-05 ENCOUNTER — Telehealth: Payer: Self-pay | Admitting: Pulmonary Disease

## 2018-10-05 ENCOUNTER — Ambulatory Visit (INDEPENDENT_AMBULATORY_CARE_PROVIDER_SITE_OTHER): Payer: PPO | Admitting: Pulmonary Disease

## 2018-10-05 ENCOUNTER — Encounter: Payer: Self-pay | Admitting: Pulmonary Disease

## 2018-10-05 VITALS — BP 142/88 | HR 77 | Ht 62.75 in | Wt 172.6 lb

## 2018-10-05 DIAGNOSIS — G4733 Obstructive sleep apnea (adult) (pediatric): Secondary | ICD-10-CM

## 2018-10-05 DIAGNOSIS — Z23 Encounter for immunization: Secondary | ICD-10-CM

## 2018-10-05 HISTORY — DX: Obstructive sleep apnea (adult) (pediatric): G47.33

## 2018-10-05 NOTE — Telephone Encounter (Signed)
FYI for [employer] and Burman Nieves.

## 2018-10-05 NOTE — Assessment & Plan Note (Signed)
Patient Instructions  Will schedule mask desenzitation fitting Continue CPAP at current settings Maintain healthy weight Do not drive if drowsy Try biotene for mouth dryness Please follow up with Sleep doctor at [facility] Please follow up with Dr. Lake Bells in 6 months or sooner if needed

## 2018-10-05 NOTE — Progress Notes (Signed)
Subjective:    Patient ID: Grace Schmitt, female    DOB: 1946/01/15, 72 y.o.   MRN: [id number]  Synopsis: Sarcoidosis. Referred in the fall 2018 for evaluation of mediastinal lymphadenopathy discovered in the midst of symptomatic hypercalcemia and some pulmonary nodules.  Underwent an endobronchial ultrasound-guided needle aspiration of mediastinal lymphadenopathy and found to have noncaseating granulomas.  She was treated with prednisone in 2018 with resolution of hypercalcemia and symptoms.  She also has fibromyalgia.  HPI Chief Complaint  Patient presents with  . Follow-up   OV 10/05/18 - CPAP follow up Patient presents for a CPAP follow up. She states that she has been compliant with CPAP. States that she is having issues with her mouth being too dry while using it. She tried to increase humidity, but states that she felt too much pressure and felt like air was leaking. States that restless leg is interfering with CPAP usage. She has to get up and walk at nights due to severe restless leg. She denies any shortness of breath, fever, or chest pain.   CPAP compliance report 09/05/18 - 10/04/18 Usage days 27/30 (90%), average usage 5 hours, autoset 5-15 cm H2O, AHI: 6.3  Past Medical History:  Diagnosis Date  . Allergy    prn claritin  . Depression   . Fibromyalgia   . Headache    hx of none since menopause  . Hx of colonic polyps serrated and adenomatous 05/28/2005  . Hyperlipidemia   . Hypothyroidism   . Ischemic chest pain ([place])   . PONV (postoperative nausea and vomiting)   . Restless leg syndrome   . Sarcoidosis    lung mass  . Thyroid disease         Review of Systems  Constitutional: Negative for fever and unexpected weight change.  HENT: Negative for nosebleeds, postnasal drip, rhinorrhea, sinus pressure, sneezing and sore throat.   Respiratory: Negative for cough, chest tightness, shortness of breath and wheezing.   Cardiovascular: Negative for palpitations and  leg swelling.  Gastrointestinal: Negative for vomiting.       Objective:   Physical Exam  Constitutional: She is oriented to person, place, and time. She appears well-developed and well-nourished. No distress.  HENT:  Head: Normocephalic and atraumatic.  Cardiovascular: Normal rate and regular rhythm.  Pulmonary/Chest: Effort normal and breath sounds normal. No respiratory distress.  Musculoskeletal: Normal range of motion.  Neurological: She is alert and oriented to person, place, and time.  Skin: Skin is warm and dry.  Psychiatric: She has a normal mood and affect.   Vitals:   10/05/18 0958  BP: (!) 142/88  Pulse: 77  SpO2: 96%  Weight: 172 lb 9.6 oz (78.3 kg)  Height: 5' 2.75" (1.594 m)    Other imaging: MRI brain August 2018 showed no evidence of a stroke  Chest imaging: August 2018 CT chest images independently reviewed showing upper lobe predominant bronchovascular distributed pulmonary nodules, there is one predominant nodule in the right middle lobe which is about 1.1 cm in size. There is also significant mediastinal lymphadenopathy.  Echocardiogram: August 2018 transthoracic echocardiogram showing normal LVEF, grade 1 diastolic dysfunction  Pulmonary function testing: January 2019 ratio normal, FEV1 1.74 L, FVC 1.97 L 71% predicted, total lung capacity 3.76 L 77% predicted, DLCO 16.54 mL 73% predicted August 2019 ratio normal, FVC 2.2 L 79% predicted, total lung capacity 4.5 L 93% predicted, DLCO 18.3 mL 81% predicted  Micro: September 2018 BAL: Mucor species January 2018 Beta-D-glucan negative, Galactomannan  negative  Pathology: September 22, 2017 granulomatous inflammation on endobronchial biopsy and lymph node needle aspiration  Labs: August 2018 in the midst of hypercalcemia: 11 pg per mL (low), PTH related peptide was negative, 1 25-hydroxy vitamin D normal 02/2018 BMET: Calcium 9.3 mg/dL     Assessment & Plan:  OSA (obstructive sleep apnea) - Plan:  Desensitization mask fit  Encounter for immunization - Plan: Flu vaccine HIGH DOSE PF  Patient Instructions  Will schedule mask desenzitation fitting Continue CPAP at current settings Maintain healthy weight Do not drive if drowsy Try biotene for mouth dryness Please follow up with Sleep doctor at [facility] Please follow up with Dr. Lake Bells in 6 months or sooner if needed     Current Outpatient Medications:  .  Azelaic Acid 15 % cream, After skin is thoroughly washed and patted dry, gently but thoroughly massage a thin film of azelaic acid cream into the affected area twice daily, in the morning and evening. (Patient taking differently: Apply 1 application topically 2 (two) times daily as needed (rosacea.). After skin is thoroughly washed and patted dry, gently but thoroughly massage a thin film of azelaic acid cream into the affected area twice daily, in the morning and evening as needed for rosacea), Disp: 50 g, Rfl: 4 .  co-enzyme Q-10 30 MG capsule, Take by mouth daily. , Disp: , Rfl:  .  Dextrose-Fructose-Sod Citrate (NAUZENE) [id number] MG CHEW, Chew 2 tablets by mouth 2 (two) times daily as needed (nausea/vomiting)., Disp: , Rfl:  .  Influenza vac split quadrivalent PF (FLUZONE HIGH-DOSE) 0.5 ML injection, TO BE ADMINISTERED BY PHARMACIST FOR IMMUNIZATION, Disp: , Rfl:  .  IRON, FERROUS SULFATE, PO, Take 1 tablet by mouth daily., Disp: , Rfl:  .  levothyroxine (SYNTHROID, LEVOTHROID) 50 MCG tablet, TAKE 1 TABLET (50 MCG TOTAL) BY MOUTH DAILY., Disp: 90 tablet, Rfl: 4 .  loratadine (CLARITIN) 10 MG tablet, Take 10 mg by mouth at bedtime. , Disp: , Rfl:  .  LORazepam (ATIVAN) 0.5 MG tablet, Take 0.25-0.5 mg by mouth at bedtime as needed for anxiety (for restless leg/sleep.)., Disp: , Rfl:  .  Menthol, Topical Analgesic, (STOPAIN EX), Apply 1 spray topically 3 (three) times daily as needed (for legs pain)., Disp: , Rfl:  .  methadone (DOLOPHINE) 5 MG tablet, Take 7.5 mg by mouth daily at 6  PM. For restless legs, Disp: , Rfl:  .  Multiple Vitamin (MULTIVITAMIN WITH MINERALS) TABS tablet, Take 1 tablet by mouth 2 (two) times daily., Disp: , Rfl:  .  pramipexole (MIRAPEX) 0.125 MG tablet, Take 1 tablet by mouth daily., Disp: , Rfl:  .  sennosides-docusate sodium (SENOKOT-S) 8.6-50 MG tablet, Take 1 tablet by mouth 2 (two) times daily as needed for constipation. , Disp: , Rfl:  .  TURMERIC PO, Take by mouth., Disp: , Rfl:  .  Wheat Dextrin (BENEFIBER PO), Take 1 Dose by mouth at bedtime. , Disp: , Rfl:  .  DULoxetine (CYMBALTA) 30 MG capsule, Take 60 mg by mouth daily with breakfast. , Disp: , Rfl:

## 2018-10-05 NOTE — Patient Instructions (Addendum)
Will schedule mask desenzitation fitting Continue CPAP at current settings Maintain healthy weight Do not drive if drowsy Try biotene for mouth dryness Please follow up with Sleep doctor at [facility] Please follow up with Dr. Lake Bells in 6 months or sooner if needed

## 2018-10-05 NOTE — Progress Notes (Addendum)
I saw Grace Schmitt today independently.  She tells me that she still struggles with fatigue, fibromyalgia symptoms, and her restless legs.  She has been wearing the CPAP more but she says that she has dry mouth and sometimes she feels like she is suffocating so she will take the mask off.  She still feels fatigued during the daytime.  On exam: Lungs are clear to auscultation, normal effort Cardiovascular regular rate and rhythm no murmurs gallops rubs  Impression: Fatigue Obstructive sleep apnea Restless leg syndrome Sarcoidosis Central apnea Methadone dependence  Discussion: It is not clear to me that the obstructive sleep apnea is the only cause of her fatigue.  What is clear is that there is no evidence of sarcoidosis progressing so I do not feel this is the cause.  Plan: I explained her today that we will make effort to send the results of the sleep study to her physicians in the neurology department at [facility] so that they can review this.  I think it would be helpful if they can help [place] in managing her medications, CPAP, and obstructive sleep apnea  > 50% of this 16 minute visit spent face to face

## 2018-10-06 NOTE — Telephone Encounter (Signed)
Called and spoke to patient. Patient stated that Dr. Lake Bells was going to write a letter to Dr. Lisabeth Devoid at [facility] to send along with sleep study and her compliance/therapy report of CPAP.  The fax number for [facility] is [phone]    Dr. Sherrin Daisy please advise.

## 2018-10-07 DIAGNOSIS — G4733 Obstructive sleep apnea (adult) (pediatric): Secondary | ICD-10-CM | POA: Diagnosis not present

## 2018-10-07 NOTE — Telephone Encounter (Signed)
I have printed the letter from [facility] for Dr. Lisabeth Devoid as well as pt's sleep study.  I have faxed the letter and the HST in attn: Dr. Lisabeth Devoid at the provided fax number.  Received confirmation that the fax went through.  Called pt but unable to reach her. Left a detailed message for pt letting her know the information had been faxed.  Nothing further needed.

## 2018-10-07 NOTE — Telephone Encounter (Signed)
Please send a copy of the sleep study with the following letter:   Dear Dr. Lisabeth Devoid  I have been caring for Mrs. Grace Schmitt for over 1 year now with a diagnosis of pulmonary sarcoidosis.  This was diagnosed based on an endobronchial ultrasound I performed.  At this point she has very little evidence of progression of her pulmonary sarcoidosis.  This year she complained of fatigue.  I advised that I am concerned that many of her medications may be contributing to this, but based on her symptoms we also evaluated her for sleep apnea.  The attached sleep study showed that she has sleep apnea.  Considering the fact that you help manage her restless leg syndrome and have expertise in sleep medicine would you mind seeing her in regards to her sleep apnea and to help adjust any medications or CPAP settings as appropriate?  I am not a sleep specialist but I am happy to help facilitate any orders which are necessary for this diagnosis.  Sincerely, Dr. Roselie Awkward

## 2018-10-11 ENCOUNTER — Ambulatory Visit (HOSPITAL_BASED_OUTPATIENT_CLINIC_OR_DEPARTMENT_OTHER): Payer: PPO | Attending: Pulmonary Disease | Admitting: Radiology

## 2018-10-11 DIAGNOSIS — G4733 Obstructive sleep apnea (adult) (pediatric): Secondary | ICD-10-CM

## 2018-11-07 DIAGNOSIS — G4733 Obstructive sleep apnea (adult) (pediatric): Secondary | ICD-10-CM | POA: Diagnosis not present

## 2018-11-11 ENCOUNTER — Ambulatory Visit (INDEPENDENT_AMBULATORY_CARE_PROVIDER_SITE_OTHER): Payer: PPO | Admitting: Primary Care

## 2018-11-11 ENCOUNTER — Encounter: Payer: Self-pay | Admitting: Primary Care

## 2018-11-11 ENCOUNTER — Other Ambulatory Visit (INDEPENDENT_AMBULATORY_CARE_PROVIDER_SITE_OTHER): Payer: PPO

## 2018-11-11 VITALS — BP 132/84 | HR 87 | Temp 98.3°F | Ht 62.75 in | Wt 173.4 lb

## 2018-11-11 DIAGNOSIS — R32 Unspecified urinary incontinence: Secondary | ICD-10-CM

## 2018-11-11 DIAGNOSIS — D860 Sarcoidosis of lung: Secondary | ICD-10-CM

## 2018-11-11 DIAGNOSIS — G4733 Obstructive sleep apnea (adult) (pediatric): Secondary | ICD-10-CM

## 2018-11-11 DIAGNOSIS — M797 Fibromyalgia: Secondary | ICD-10-CM | POA: Diagnosis not present

## 2018-11-11 HISTORY — DX: Unspecified urinary incontinence: R32

## 2018-11-11 LAB — URINALYSIS
Bilirubin Urine: NEGATIVE
Ketones, ur: NEGATIVE
Leukocytes, UA: NEGATIVE
Nitrite: NEGATIVE
Specific Gravity, Urine: 1.015 (ref 1.000–1.030)
Total Protein, Urine: NEGATIVE
Urine Glucose: NEGATIVE
Urobilinogen, UA: 0.2 (ref 0.0–1.0)
pH: 6 (ref 5.0–8.0)

## 2018-11-11 LAB — CBC
HCT: 41.9 % (ref 36.0–46.0)
Hemoglobin: 14 g/dL (ref 12.0–15.0)
MCHC: 33.3 g/dL (ref 30.0–36.0)
MCV: 93.9 fl (ref 78.0–100.0)
Platelets: 324 10*3/uL (ref 150.0–400.0)
RBC: 4.47 Mil/uL (ref 3.87–5.11)
RDW: 13.1 % (ref 11.5–15.5)
WBC: 9.3 10*3/uL (ref 4.0–10.5)

## 2018-11-11 LAB — BASIC METABOLIC PANEL
BUN: 23 mg/dL (ref 6–23)
CO2: 29 mEq/L (ref 19–32)
Calcium: 9.7 mg/dL (ref 8.4–10.5)
Chloride: 101 mEq/L (ref 96–112)
Creatinine, Ser: 0.75 mg/dL (ref 0.40–1.20)
GFR: 80.4 mL/min (ref >60.00)
Glucose, Bld: 110 mg/dL — ABNORMAL HIGH (ref 70–99)
Potassium: 4.5 mEq/L (ref 3.5–5.1)
Sodium: 139 mEq/L (ref 135–145)

## 2018-11-11 LAB — POTASSIUM: Potassium: 4.5 mEq/L (ref 3.5–5.1)

## 2018-11-11 LAB — MAGNESIUM: Magnesium: 2.3 mg/dL (ref 1.5–2.5)

## 2018-11-11 NOTE — Patient Instructions (Addendum)
Labs and urine today  Vital signs and neuro checks were normal   Encourage fluids and rest. Don't over due it this weekend   Needs new PCP with [facility]   Change CPAP pressure with DME company to 10- 20cm H20 Auto titrate  FU in 4 weeks with NP

## 2018-11-11 NOTE — Assessment & Plan Note (Addendum)
-   AHI 5.9; still having events. Reports good seal - Adjusting CPAP pressure to 10-20cm H20 auto titrate  - FU in 4 weeks

## 2018-11-11 NOTE — Assessment & Plan Note (Signed)
-   Pain likely secondary to recent over exertion - Continues Cymbalta for fibromyalgia and methadone for RLS - Calcium on BMET normal

## 2018-11-11 NOTE — Progress Notes (Signed)
@Patient  ID: Grace Schmitt, female    DOB: [birth year], 72 y.o.   MRN: [id number]  Chief Complaint  Patient presents with  . Acute Visit    tiredness x4 days, lightheaded    Referring provider: Marletta Lor, MD  HPI: 72 year old female, never smoked. PMH fibromyalgia, restless leg, pulmonary sarcoidosis, OSA.  11/11/2018 Patient presents today for acute visit, complaints of fatigue and body aches. Associated lightheaded, facial flushing, urinary frequency and occasional incontinence. No sob or breathing difficulties.   She was out shopping 3 days ago and over did it. States that she was running around town doing errands with her husband. Reports waking up that night with bone pain/aches, used heat pad and took her Cymbalta. States that she laid around all day and symptoms improved. Now, has lightheaded feeling with facial flushing. Some urinary frequency at night.  Using CPAP, having a hard time using it. Reports dry mouth, increased humidification to 5 and feels its leaking. She is still having events at night, up to 18. She has tried nasal mask, now has full face mask. States that she had a mask fitting.   Dr. Burnice Logan recently retired, needs to establish with new PCP provider at [facility].   Airview Download: CPAP auto pressure set at 5-15cm H20 Pressure -13.5 (95%), max 14.1 Leaks 10.7 (95%) AHIT 5.9   Allergies  Allergen Reactions  . Phenergan [Promethazine Hcl] Other (See Comments)    Pt has restless leg syndrome and the phenergan cause the RLS  to worsen     Immunization History  Administered Date(s) Administered  . Influenza Whole 09/26/2009  . Influenza, High Dose Seasonal PF 09/12/2014, 01/06/2016, 08/25/2017, 10/05/2018  . Influenza-Unspecified 09/27/2012, 09/08/2013, 08/28/2014, 12/31/2015, 10/16/2016, 08/25/2017  . PPD Test 08/31/2017  . Pneumococcal Conjugate-13 06/06/2015  . Pneumococcal Polysaccharide-23 01/22/2011, 03/29/2011,  08/25/2017  . Tdap 05/05/2011  . Zoster 05/05/2011    Past Medical History:  Diagnosis Date  . Allergy    prn claritin  . Depression   . Fibromyalgia   . Headache    hx of none since menopause  . Hx of colonic polyps serrated and adenomatous 05/28/2005  . Hyperlipidemia   . Hypothyroidism   . Ischemic chest pain ([place])   . PONV (postoperative nausea and vomiting)   . Restless leg syndrome   . Sarcoidosis    lung mass  . Thyroid disease     Tobacco History: Social History   Tobacco Use  Smoking Status Never Smoker  Smokeless Tobacco Never Used   Counseling given: Not Answered   Outpatient Medications Prior to Visit  Medication Sig Dispense Refill  . Azelaic Acid 15 % cream After skin is thoroughly washed and patted dry, gently but thoroughly massage a thin film of azelaic acid cream into the affected area twice daily, in the morning and evening. (Patient taking differently: Apply 1 application topically 2 (two) times daily as needed (rosacea.). After skin is thoroughly washed and patted dry, gently but thoroughly massage a thin film of azelaic acid cream into the affected area twice daily, in the morning and evening as needed for rosacea) 50 g 4  . co-enzyme Q-10 30 MG capsule Take by mouth daily.     Marland Kitchen Dextrose-Fructose-Sod Citrate (NAUZENE) [phone] MG CHEW Chew 2 tablets by mouth 2 (two) times daily as needed (nausea/vomiting).    . Influenza vac split quadrivalent PF (FLUZONE HIGH-DOSE) 0.5 ML injection TO BE ADMINISTERED BY PHARMACIST FOR IMMUNIZATION    .  IRON, FERROUS SULFATE, PO Take 1 tablet by mouth daily.    Marland Kitchen levothyroxine (SYNTHROID, LEVOTHROID) 50 MCG tablet TAKE 1 TABLET (50 MCG TOTAL) BY MOUTH DAILY. 90 tablet 4  . loratadine (CLARITIN) 10 MG tablet Take 10 mg by mouth at bedtime.     Marland Kitchen LORazepam (ATIVAN) 0.5 MG tablet Take 0.25-0.5 mg by mouth at bedtime as needed for anxiety (for restless leg/sleep.).    Marland Kitchen Menthol, Topical Analgesic, (STOPAIN EX) Apply 1  spray topically 3 (three) times daily as needed (for legs pain).    . methadone (DOLOPHINE) 5 MG tablet Take 7.5 mg by mouth daily at 6 PM. For restless legs    . Multiple Vitamin (MULTIVITAMIN WITH MINERALS) TABS tablet Take 1 tablet by mouth 2 (two) times daily.    . pramipexole (MIRAPEX) 0.125 MG tablet Take 1 tablet by mouth daily.    . sennosides-docusate sodium (SENOKOT-S) 8.6-50 MG tablet Take 1 tablet by mouth 2 (two) times daily as needed for constipation.     . TURMERIC PO Take by mouth.    . Wheat Dextrin (BENEFIBER PO) Take 1 Dose by mouth at bedtime.     . DULoxetine (CYMBALTA) 30 MG capsule Take 60 mg by mouth daily with breakfast.      No facility-administered medications prior to visit.     Review of Systems  Review of Systems  Constitutional: Positive for fatigue.  Respiratory: Negative.   Cardiovascular: Negative.   Genitourinary: Positive for frequency.   Physical Exam  BP 132/84   Pulse 87   Temp 98.3 F (36.8 C)   Ht 5' 2.75" (1.594 m)   Wt 173 lb 6.4 oz (78.7 kg)   SpO2 98%   BMI 30.96 kg/m    Orthos: BP 132/84 sitting; 122/80 standing   Physical Exam  Constitutional: She is oriented to person, place, and time. She appears well-developed and well-nourished. No distress.  HENT:  Head: Normocephalic and atraumatic.  Eyes: Pupils are equal, round, and reactive to light. EOM are normal.  Neck: Normal range of motion. Neck supple.  Cardiovascular: Normal rate and regular rhythm.  CTA  Pulmonary/Chest: Effort normal. No respiratory distress. She has no wheezes.  Musculoskeletal: Normal range of motion.  Neurological: She is oriented to person, place, and time. No cranial nerve deficit or sensory deficit. Coordination normal.  Neuro exam intact   Skin: Skin is warm and dry.  Psychiatric: She has a normal mood and affect. Her behavior is normal. Judgment and thought content normal.     Lab Results:  CBC    Component Value Date/Time   WBC 9.3  11/11/2018 1053   RBC 4.47 11/11/2018 1053   HGB 14.0 11/11/2018 1053   HCT 41.9 11/11/2018 1053   PLT 324.0 11/11/2018 1053   MCV 93.9 11/11/2018 1053   MCH 29.5 08/21/2017 2225   MCHC 33.3 11/11/2018 1053   RDW 13.1 11/11/2018 1053   LYMPHSABS 2.0 07/26/2018 1019   MONOABS 0.7 07/26/2018 1019   EOSABS 0.4 07/26/2018 1019   BASOSABS 0.1 07/26/2018 1019    BMET    Component Value Date/Time   NA 139 11/11/2018 1053   K 4.5 11/11/2018 1053   K 4.5 11/11/2018 1053   CL 101 11/11/2018 1053   CO2 29 11/11/2018 1053   GLUCOSE 110 (H) 11/11/2018 1053   BUN 23 11/11/2018 1053   CREATININE 0.75 11/11/2018 1053   CALCIUM 9.7 11/11/2018 1053   CALCIUM 11.4 (H) 08/14/2017 0056   GFRNONAA >60  08/21/2017 2225   GFRAA >60 08/21/2017 2225    BNP No results found for: BNP  ProBNP No results found for: PROBNP  Imaging: No results found.   Assessment & Plan:   OSA (obstructive sleep apnea) - AHI 5.9; still having events. Reports good seal - Adjusting CPAP pressure to 10-20cm H20 auto titrate  - FU in 4 weeks   Pulmonary sarcoidosis (HCC) - Stable; no breathing symptoms - Labs obtained; CBC and BMET normal   Fibromyalgia - Pain likely secondary to recent over exertion - Continues Cymbalta for fibromyalgia and methadone for RLS - Calcium on BMET normal   Urinary incontinence - UA normal, trace hgb  - Awaiting culture    Martyn Ehrich, NP 11/11/2018

## 2018-11-11 NOTE — Progress Notes (Signed)
   Subjective:    Patient ID: Grace Schmitt, female    DOB: 1946-05-05, 72 y.o.   MRN: [id number]  HPI    Review of Systems     Objective:   Physical Exam        Assessment & Plan:

## 2018-11-11 NOTE — Assessment & Plan Note (Signed)
-   UA normal, trace hgb  - Awaiting culture

## 2018-11-11 NOTE — Assessment & Plan Note (Signed)
-   Stable; no breathing symptoms - Labs obtained; CBC and BMET normal

## 2018-11-12 LAB — URINE CULTURE
MICRO NUMBER:: 91378757
Result:: NO GROWTH
SPECIMEN QUALITY:: ADEQUATE

## 2018-11-12 NOTE — Progress Notes (Signed)
Reviewed, agree 

## 2018-11-14 NOTE — Progress Notes (Signed)
Relayed results to pt.  She stated she is beginning to feel better.   Instructed her to call back if she starts to feel worse.  Nothing further needed at this time.

## 2018-12-02 ENCOUNTER — Ambulatory Visit: Payer: Self-pay

## 2018-12-02 ENCOUNTER — Ambulatory Visit: Payer: PPO | Admitting: Primary Care

## 2018-12-02 ENCOUNTER — Encounter: Payer: Self-pay | Admitting: Primary Care

## 2018-12-02 VITALS — BP 116/72 | HR 74 | Temp 98.1°F | Ht 62.75 in | Wt 175.2 lb

## 2018-12-02 DIAGNOSIS — G4733 Obstructive sleep apnea (adult) (pediatric): Secondary | ICD-10-CM | POA: Diagnosis not present

## 2018-12-02 DIAGNOSIS — D71 Functional disorders of polymorphonuclear neutrophils: Secondary | ICD-10-CM

## 2018-12-02 NOTE — Telephone Encounter (Signed)
Pt. Reports she has had dizziness for 2-3 months. Feels like she is going to faint,gets hot and sometimes sweaty. Sometimes is incontinent of urine.These episodes happen at different times of the day and night. Has sarcoidosis and has discussed these symptoms with Pulmonology. Has TOC appointment for 12/09/18. Appointment made for next week.Instructed if symptoms worsen, go to ED.  Reason for Disposition . [1] MODERATE dizziness (e.g., interferes with normal activities) AND [2] has NOT been evaluated by physician for this  (Exception: dizziness caused by heat exposure, sudden standing, or poor fluid intake)  Answer Assessment - Initial Assessment Questions 1. DESCRIPTION: "Describe your dizziness."     Lightheaded 2. LIGHTHEADED: "Do you feel lightheaded?" (e.g., somewhat faint, woozy, weak upon standing)     Feels faint 3. VERTIGO: "Do you feel like either you or the room is spinning or tilting?" (i.e. vertigo)      No 4. SEVERITY: "How bad is it?"  "Do you feel like you are going to faint?" "Can you stand and walk?"   - MILD - walking normally   - MODERATE - interferes with normal activities (e.g., work, school)    - SEVERE - unable to stand, requires support to walk, feels like passing out now.      Moderate 5. ONSET:  "When did the dizziness begin?"     Started 2-3 months ago 6. AGGRAVATING FACTORS: "Does anything make it worse?" (e.g., standing, change in head position)     No 7. HEART RATE: "Can you tell me your heart rate?" "How many beats in 15 seconds?"  (Note: not all patients can do this)       No 8. CAUSE: "What do you think is causing the dizziness?"     Unsure 9. RECURRENT SYMPTOM: "Have you had dizziness before?" If so, ask: "When was the last time?" "What happened that time?"     No 10. OTHER SYMPTOMS: "Do you have any other symptoms?" (e.g., fever, chest pain, vomiting, diarrhea, bleeding)       Gets sweaty and sometimes looses control of bladder 11. PREGNANCY: "Is there  any chance you are pregnant?" "When was your last menstrual period?"       No  Protocols used: DIZZINESS [facility]

## 2018-12-02 NOTE — Progress Notes (Signed)
@Patient  ID: Grace Schmitt, female    DOB: [birth year], 72 y.o.   MRN: [id number]  Chief Complaint  Patient presents with  . Follow-up    doing better until 12/5, lightheadedness and loss control of bladder    Referring provider: Marletta Lor, MD  HPI: 72 year old female, never smoked. PMH fibromyalgia, restless leg, pulmonary sarcoidosis, OSA. Patient of Dr. Lake Bells,   11/11/2018 Patient presents today for acute visit, complaints of fatigue and body aches. Associated lightheaded, facial flushing, urinary frequency and occasional incontinence. No sob or breathing difficulties.   She was out shopping 3 days ago and over did it. States that she was running around town doing errands with her husband. Reports waking up that night with bone pain/aches, used heat pad and took her Cymbalta. States that she laid around all day and symptoms improved. Now, has lightheaded feeling with facial flushing. Some urinary frequency at night.  Using CPAP, having a hard time using it. Reports dry mouth, increased humidification to 5 and feels its leaking. She is still having events at night, up to 18. She has tried nasal mask, now has full face mask. States that she had a mask fitting.   Dr. Burnice Logan recently retired, needs to establish with new PCP provider at [facility].   [facility] Download: CPAP auto pressure set at 5-15cm H20 Pressure -13.5 (95%), max 14.1 Leaks 10.7 (95%) AHI 5.9   12/11/2018 Presents today for 1 month follow-up. States that she was feeling a lot better that last 3-4 weeks until yesterday when she had re-occurrence of facial flushing, dizziness and incontinence. Feels like she's gonna pass out but never does. She had 2 episodes where she lost control of her bladder and felt "crappy" all day. Urine culture during last visit was negative. Labs were normal. Neuros grossly intact.   In term of sleep apnea she states that she is doing better since pressure was  increased, she has slept better and noticed a difference.    Needs to establish with new PCP, has not yet called.   Allergies  Allergen Reactions  . Phenergan [Promethazine Hcl] Other (See Comments)    Pt has restless leg syndrome and the phenergan cause the RLS  to worsen     Immunization History  Administered Date(s) Administered  . Influenza Whole 09/26/2009  . Influenza, High Dose Seasonal PF 09/12/2014, 01/06/2016, 08/25/2017, 10/05/2018  . Influenza-Unspecified 09/27/2012, 09/08/2013, 08/28/2014, 12/31/2015, 10/16/2016, 08/25/2017  . PPD Test 08/31/2017  . Pneumococcal Conjugate-13 06/06/2015  . Pneumococcal Polysaccharide-23 01/22/2011, 03/29/2011, 08/25/2017  . Tdap 05/05/2011  . Zoster 05/05/2011    Past Medical History:  Diagnosis Date  . Allergy    prn claritin  . Depression   . Fibromyalgia   . Headache    hx of none since menopause  . Hepatitis   . Hx of colonic polyps serrated and adenomatous 05/28/2005  . Hyperlipidemia   . Hypothyroidism   . Ischemic chest pain ([place])   . PONV (postoperative nausea and vomiting)   . Restless leg syndrome   . Rheumatic fever   . Sarcoidosis    lung mass  . Thyroid disease     Tobacco History: Social History   Tobacco Use  Smoking Status Never Smoker  Smokeless Tobacco Never Used   Counseling given: Not Answered   Outpatient Medications Prior to Visit  Medication Sig Dispense Refill  . Azelaic Acid 15 % cream After skin is thoroughly washed and patted dry, gently but thoroughly  massage a thin film of azelaic acid cream into the affected area twice daily, in the morning and evening. (Patient taking differently: Apply 1 application topically 2 (two) times daily as needed (rosacea.). After skin is thoroughly washed and patted dry, gently but thoroughly massage a thin film of azelaic acid cream into the affected area twice daily, in the morning and evening as needed for rosacea) 50 g 4  . co-enzyme Q-10 30 MG capsule  Take by mouth daily.     Marland Kitchen Dextrose-Fructose-Sod Citrate (NAUZENE) [phone] MG CHEW Chew 2 tablets by mouth 2 (two) times daily as needed (nausea/vomiting).    . IRON, FERROUS SULFATE, PO Take 1 tablet by mouth daily.    Marland Kitchen loratadine (CLARITIN) 10 MG tablet Take 10 mg by mouth at bedtime.     Marland Kitchen LORazepam (ATIVAN) 0.5 MG tablet Take 0.25-0.5 mg by mouth at bedtime as needed for anxiety (for restless leg/sleep.).    Marland Kitchen Menthol, Topical Analgesic, (STOPAIN EX) Apply 1 spray topically 3 (three) times daily as needed (for legs pain).    . methadone (DOLOPHINE) 5 MG tablet Take 7.5 mg by mouth daily at 6 PM. For restless legs    . Multiple Vitamin (MULTIVITAMIN WITH MINERALS) TABS tablet Take 1 tablet by mouth 2 (two) times daily.    . pramipexole (MIRAPEX) 0.125 MG tablet Take 1 tablet by mouth daily.    . sennosides-docusate sodium (SENOKOT-S) 8.6-50 MG tablet Take 1 tablet by mouth 2 (two) times daily as needed for constipation.     . vitamin C (ASCORBIC ACID) 500 MG tablet Take 500 mg by mouth daily.    . Wheat Dextrin (BENEFIBER PO) Take 1 Dose by mouth at bedtime.     . Influenza vac split quadrivalent PF (FLUZONE HIGH-DOSE) 0.5 ML injection TO BE ADMINISTERED BY PHARMACIST FOR IMMUNIZATION    . levothyroxine (SYNTHROID, LEVOTHROID) 50 MCG tablet TAKE 1 TABLET (50 MCG TOTAL) BY MOUTH DAILY. 90 tablet 4  . DULoxetine (CYMBALTA) 30 MG capsule Take 60 mg by mouth daily with breakfast.     . TURMERIC PO Take by mouth.     No facility-administered medications prior to visit.     Review of Systems  Review of Systems  Constitutional:       Facial flushing  Respiratory: Negative.   Cardiovascular: Negative.   Genitourinary: Positive for frequency.       Incontinence   Musculoskeletal: Negative.   Neurological: Positive for dizziness. Negative for seizures, speech difficulty, weakness and numbness.    Physical Exam  BP 116/72 (BP Location: Right Arm, Cuff Size: Normal)   Pulse 74   Temp  98.1 F (36.7 C)   Ht 5' 2.75" (1.594 m)   Wt 175 lb 3.2 oz (79.5 kg)   SpO2 95%   BMI 31.28 kg/m  Physical Exam  Constitutional: She is oriented to person, place, and time. She appears well-developed and well-nourished. No distress.  HENT:  Head: Normocephalic and atraumatic.  Eyes: Pupils are equal, round, and reactive to light. EOM are normal.  Neck: Normal range of motion. Neck supple.  Cardiovascular: Normal rate and regular rhythm.  Regular rate and rhythm   Pulmonary/Chest: Effort normal and breath sounds normal. No respiratory distress. She has no wheezes. She has no rales.  CTA t/o  Musculoskeletal: Normal range of motion.  Neurological: She is alert and oriented to person, place, and time.  Grossly intact  Skin: Skin is warm and dry.  Psychiatric: She has a normal  mood and affect. Her behavior is normal. Judgment and thought content normal.     Lab Results:  CBC    Component Value Date/Time   WBC 9.3 11/11/2018 1053   RBC 4.47 11/11/2018 1053   HGB 14.0 11/11/2018 1053   HCT 41.9 11/11/2018 1053   PLT 324.0 11/11/2018 1053   MCV 93.9 11/11/2018 1053   MCH 29.5 08/21/2017 2225   MCHC 33.3 11/11/2018 1053   RDW 13.1 11/11/2018 1053   LYMPHSABS 2.0 07/26/2018 1019   MONOABS 0.7 07/26/2018 1019   EOSABS 0.4 07/26/2018 1019   BASOSABS 0.1 07/26/2018 1019    BMET    Component Value Date/Time   NA 139 11/11/2018 1053   K 4.5 11/11/2018 1053   K 4.5 11/11/2018 1053   CL 101 11/11/2018 1053   CO2 29 11/11/2018 1053   GLUCOSE 110 (H) 11/11/2018 1053   BUN 23 11/11/2018 1053   CREATININE 0.75 11/11/2018 1053   CALCIUM 9.7 11/11/2018 1053   CALCIUM 11.4 (H) 08/14/2017 0056   GFRNONAA >60 08/21/2017 2225   GFRAA >60 08/21/2017 2225    BNP No results found for: BNP  ProBNP No results found for: PROBNP  Imaging: No results found.   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

## 2018-12-02 NOTE — Patient Instructions (Signed)
Follow up with primary care regarding bladder incontinence and dizziness  Please call [facility] to set up an apt  ? Consider neurology apt- [facility]   Labs were normal Urine culture no growth  We are working to get you a chin strap for CPAP   Don't drive if feeling faint or dizzy  Return with Dr. Lake Bells in May 2020

## 2018-12-06 ENCOUNTER — Ambulatory Visit (INDEPENDENT_AMBULATORY_CARE_PROVIDER_SITE_OTHER): Payer: PPO | Admitting: Family Medicine

## 2018-12-06 ENCOUNTER — Encounter: Payer: Self-pay | Admitting: Family Medicine

## 2018-12-06 VITALS — BP 123/83 | HR 73 | Temp 98.2°F | Resp 16 | Ht 63.0 in | Wt 174.0 lb

## 2018-12-06 DIAGNOSIS — R32 Unspecified urinary incontinence: Secondary | ICD-10-CM | POA: Diagnosis not present

## 2018-12-06 DIAGNOSIS — R42 Dizziness and giddiness: Secondary | ICD-10-CM

## 2018-12-06 DIAGNOSIS — R61 Generalized hyperhidrosis: Secondary | ICD-10-CM | POA: Diagnosis not present

## 2018-12-06 LAB — CK: Total CK: 57 U/L (ref 7–177)

## 2018-12-06 LAB — POC URINALSYSI DIPSTICK (AUTOMATED)
Bilirubin, UA: NEGATIVE
Blood, UA: NEGATIVE
Glucose, UA: NEGATIVE
Ketones, UA: NEGATIVE
Leukocytes, UA: NEGATIVE
Nitrite, UA: NEGATIVE
Protein, UA: NEGATIVE
Spec Grav, UA: 1.015 (ref 1.010–1.025)
Urobilinogen, UA: 0.2 E.U./dL
pH, UA: 6 (ref 5.0–8.0)

## 2018-12-06 LAB — MAGNESIUM: Magnesium: 2.5 mg/dL (ref 1.5–2.5)

## 2018-12-06 NOTE — Progress Notes (Signed)
Patient ID: Grace Schmitt, female  DOB: 1946-12-04, 72 y.o.   MRN: [id number] Patient Care Team    Relationship Specialty Notifications Start End  Ma Hillock, DO PCP - General Family Medicine  12/06/18     Chief Complaint  Patient presents with  . Dizziness    light headed, loose control of bladder    Subjective:  Grace Schmitt is a 72 y.o.  female present for TOC- from other [facility] provider.  All past medical history, surgical history, allergies, family history, immunizations, medications and social history were updated in the electronic medical record today. All recent labs, ED visits and hospitalizations within the last year were reviewed.  Dizziness: pt presents today for dizziness. She states it is the type of feeling one gets when you pass out, but she has never lost consciousness with it. It is not room spinning. She reports it has been occurring for past 2 months, but worsened last week. She reports last Tuesday she had 4-5 episodes that day. Last episode was Wednesday (6 days ago). Each episode last about 10 minutes and more recently has been associated with diaphoresis and loss of bladder control. She denies MSK movements, twitches or convulsions. She denies shortness of breath, but has had "chest tightness" after episodes sometimes- but not recently. Prior to last Tuesday episodes were a dew times a month only. She also states she can feel "wiped out" for a few days after event. She has a history of sarcoid, all labs reviewed from 11/15/219 had normal calcium, electrolytes, WBC, TSH. CT 01/20/2018 appeared improved from prior. Cardiac stress test 2017 with Dr. Gwenlyn Found- normal- low risk study. Carotid duplex 07/2017 bilateral 1-39% ICA plaquing. Echo 07/2017 [id number]%, diastolic 1 dysfunction. She denies any medication changes over the last month. She is on methadone For RLS. She infrequently takes the mirapex and has not taken it in over a month. She takes a magnesium  supplement. She is prescribed cymbalta- dose unchanged. She has normal kidney and liver function.   Depression screen [facility] 2/9 07/26/2018 08/25/2017 06/05/2016 04/30/2015 01/22/2015  Decreased Interest 0 0 0 0 1  Down, Depressed, Hopeless 0 0 0 0 0  PHQ - 2 Score 0 0 0 0 1   No flowsheet data found.   Current Exercise Habits: Home exercise routine, Type of exercise: walking, Time (Minutes): 40, Frequency (Times/Week): 5, Weekly Exercise (Minutes/Week): 200, Intensity: Mild Exercise limited by: cardiac condition(s) Fall Risk  12/06/2018 07/26/2018 06/05/2016 04/30/2015 01/22/2015  Falls in the past year? 0 No No No No  Follow up Falls evaluation completed - - - -    Immunization History  Administered Date(s) Administered  . Influenza Whole 09/26/2009  . Influenza, High Dose Seasonal PF 09/12/2014, 01/06/2016, 08/25/2017, 10/05/2018  . Influenza-Unspecified 09/27/2012, 09/08/2013, 08/28/2014, 12/31/2015, 10/16/2016, 08/25/2017  . PPD Test 08/31/2017  . Pneumococcal Conjugate-13 06/06/2015  . Pneumococcal Polysaccharide-23 01/22/2011, 03/29/2011, 08/25/2017  . Tdap 05/05/2011  . Zoster 05/05/2011    No exam data present  Past Medical History:  Diagnosis Date  . Allergy    prn claritin  . Depression   . Fibromyalgia   . Headache    hx of none since menopause  . Hx of colonic polyps serrated and adenomatous 05/28/2005  . Hyperlipidemia   . Hypothyroidism   . Ischemic chest pain ([place])   . PONV (postoperative nausea and vomiting)   . Restless leg syndrome   . Sarcoidosis    lung mass  . Thyroid  disease    Allergies  Allergen Reactions  . Phenergan [Promethazine Hcl] Other (See Comments)    Pt has restless leg syndrome and the phenergan cause the RLS  to worsen    Past Surgical History:  Procedure Laterality Date  . COLONOSCOPY  October 2011  . ENDOBRONCHIAL ULTRASOUND Bilateral 09/20/2017   Procedure: ENDOBRONCHIAL ULTRASOUND;  Surgeon: Juanito Doom, MD;  Location: [facility]  ENDOSCOPY;  Service: Cardiopulmonary;  Laterality: Bilateral;  . TONSILLECTOMY  1951  . TONSILLECTOMY    . TUBAL LIGATION  1978   x2   Family History  Problem Relation Age of Onset  . Dementia Mother   . Heart disease Mother        smal vessel disease  . Hypertension Brother   . Colon cancer Neg Hx   . Colon polyps Neg Hx   . Breast cancer Neg Hx    Social History   Socioeconomic History  . Marital status: Married    Spouse name: Not on file  . Number of children: Not on file  . Years of education: Not on file  . Highest education level: Not on file  Occupational History  . Not on file  Social Needs  . Financial resource strain: Not on file  . Food insecurity:    Worry: Not on file    Inability: Not on file  . Transportation needs:    Medical: Not on file    Non-medical: Not on file  Tobacco Use  . Smoking status: Never Smoker  . Smokeless tobacco: Never Used  Substance and Sexual Activity  . Alcohol use: No    Alcohol/week: 0.0 standard drinks  . Drug use: No  . Sexual activity: Not Currently  Lifestyle  . Physical activity:    Days per week: Not on file    Minutes per session: Not on file  . Stress: Not on file  Relationships  . Social connections:    Talks on phone: Not on file    Gets together: Not on file    Attends religious service: Not on file    Active member of club or organization: Not on file    Attends meetings of clubs or organizations: Not on file    Relationship status: Not on file  . Intimate partner violence:    Fear of current or ex partner: Not on file    Emotionally abused: Not on file    Physically abused: Not on file    Forced sexual activity: Not on file  Other Topics Concern  . Not on file  Social History Narrative  . Not on file   Allergies as of 12/06/2018      Reactions   Phenergan [promethazine Hcl] Other (See Comments)   Pt has restless leg syndrome and the phenergan cause the RLS  to worsen       Medication List         Accurate as of December 06, 2018 11:59 PM. Always use your most recent med list.        Azelaic Acid 15 % cream After skin is thoroughly washed and patted dry, gently but thoroughly massage a thin film of azelaic acid cream into the affected area twice daily, in the morning and evening.   BENEFIBER PO Take 1 Dose by mouth at bedtime.   co-enzyme Q-10 30 MG capsule Take by mouth daily.   DULoxetine 30 MG capsule Commonly known as:  CYMBALTA Take 60 mg by mouth daily with breakfast.  Influenza vac split quadrivalent PF 0.5 ML injection Commonly known as:  FLUZONE HIGH-DOSE TO BE ADMINISTERED BY PHARMACIST FOR IMMUNIZATION   IRON (FERROUS SULFATE) PO Take 1 tablet by mouth daily.   levothyroxine 50 MCG tablet Commonly known as:  SYNTHROID, LEVOTHROID TAKE 1 TABLET (50 MCG TOTAL) BY MOUTH DAILY.   loratadine 10 MG tablet Commonly known as:  CLARITIN Take 10 mg by mouth at bedtime.   LORazepam 0.5 MG tablet Commonly known as:  ATIVAN Take 0.25-0.5 mg by mouth at bedtime as needed for anxiety (for restless leg/sleep.).   methadone 5 MG tablet Commonly known as:  DOLOPHINE Take 7.5 mg by mouth daily at 6 PM. For restless legs   multivitamin with minerals Tabs tablet Take 1 tablet by mouth 2 (two) times daily.   NAUZENE [phone] MG Chew Generic drug:  Dextrose-Fructose-Sod Citrate Chew 2 tablets by mouth 2 (two) times daily as needed (nausea/vomiting).   pramipexole 0.125 MG tablet Commonly known as:  MIRAPEX Take 1 tablet by mouth daily.   sennosides-docusate sodium 8.6-50 MG tablet Commonly known as:  SENOKOT-S Take 1 tablet by mouth 2 (two) times daily as needed for constipation.   STOPAIN EX Apply 1 spray topically 3 (three) times daily as needed (for legs pain).   vitamin C 500 MG tablet Commonly known as:  ASCORBIC ACID Take 500 mg by mouth daily.       All past medical history, surgical history, allergies, family history, immunizations  andmedications were updated in the EMR today and reviewed under the history and medication portions of their EMR.    Recent Results (from the past 2160 hour(s))  Basic metabolic panel     Status: Abnormal   Collection Time: 11/11/18 10:53 AM  Result Value Ref Range   Sodium 139 135 - 145 mEq/L   Potassium 4.5 3.5 - 5.1 mEq/L   Chloride 101 96 - 112 mEq/L   CO2 29 19 - 32 mEq/L   Glucose, Bld 110 (H) 70 - 99 mg/dL   BUN 23 6 - 23 mg/dL   Creatinine, Ser 0.75 0.40 - 1.20 mg/dL   Calcium 9.7 8.4 - 10.5 mg/dL   GFR 80.40 >60.00 mL/min  CBC     Status: None   Collection Time: 11/11/18 10:53 AM  Result Value Ref Range   WBC 9.3 4.0 - 10.5 K/uL   RBC 4.47 3.87 - 5.11 Mil/uL   Platelets 324.0 150.0 - 400.0 K/uL   Hemoglobin 14.0 12.0 - 15.0 g/dL   HCT 41.9 36.0 - 46.0 %   MCV 93.9 78.0 - 100.0 fl   MCHC 33.3 30.0 - 36.0 g/dL   RDW 13.1 11.5 - 15.5 %  Potassium     Status: None   Collection Time: 11/11/18 10:53 AM  Result Value Ref Range   Potassium 4.5 3.5 - 5.1 mEq/L  Magnesium     Status: None   Collection Time: 11/11/18 10:53 AM  Result Value Ref Range   Magnesium 2.3 1.5 - 2.5 mg/dL  Urine Culture     Status: None   Collection Time: 11/11/18 10:53 AM  Result Value Ref Range   MICRO NUMBER: [id number]    SPECIMEN QUALITY: ADEQUATE    Sample Source NOT GIVEN    STATUS: FINAL    Result: No Growth   Urinalysis     Status: Abnormal   Collection Time: 11/11/18 10:53 AM  Result Value Ref Range   Color, Urine YELLOW Yellow;Lt. Yellow   APPearance CLEAR Clear  Specific Gravity, Urine 1.015 1.000 - 1.030   pH 6.0 5.0 - 8.0   Total Protein, Urine NEGATIVE Negative   Urine Glucose NEGATIVE Negative   Ketones, ur NEGATIVE Negative   Bilirubin Urine NEGATIVE Negative   Hgb urine dipstick TRACE-INTACT (A) Negative   Urobilinogen, UA 0.2 0.0 - 1.0   Leukocytes, UA NEGATIVE Negative   Nitrite NEGATIVE Negative  POCT Urinalysis Dipstick (Automated)     Status: Normal   Collection  Time: 12/06/18 11:02 AM  Result Value Ref Range   Color, UA yellow    Clarity, UA clear    Glucose, UA Negative Negative   Bilirubin, UA Negative    Ketones, UA Negative    Spec Grav, UA 1.015 1.010 - 1.025   Blood, UA Negative    pH, UA 6.0 5.0 - 8.0   Protein, UA Negative Negative   Urobilinogen, UA 0.2 0.2 or 1.0 E.U./dL   Nitrite, UA Negative    Leukocytes, UA Negative Negative  Urine Culture     Status: None   Collection Time: 12/06/18 11:35 AM  Result Value Ref Range   MICRO NUMBER: [id number]    SPECIMEN QUALITY: Adequate    Sample Source URINE    STATUS: FINAL    Result: No Growth   CK (Creatine Kinase)     Status: None   Collection Time: 12/06/18 11:35 AM  Result Value Ref Range   Total CK 57 7 - 177 U/L  CK Total (and CKMB)     Status: None   Collection Time: 12/06/18 11:35 AM  Result Value Ref Range   Total CK 66 29 - 143 U/L   CK, MB <0.7 0 - 5.0 ng/mL   Relative Index  0 - 4.0    Comment: Result not calculated because one or more required values exceed analytical limits.   Magnesium     Status: None   Collection Time: 12/06/18 11:35 AM  Result Value Ref Range   Magnesium 2.5 1.5 - 2.5 mg/dL  Iron, TIBC and Ferritin Panel     Status: None   Collection Time: 12/06/18 11:35 AM  Result Value Ref Range   Iron 74 45 - 160 mcg/dL   TIBC 330 250 - 450 mcg/dL (calc)   %SAT 22 16 - 45 % (calc)   Ferritin 66 16 - 288 ng/mL    Mm 3d Screen Breast Bilateral  Result Date: 07/04/2018 CLINICAL DATA:  Screening. EXAM: DIGITAL SCREENING BILATERAL MAMMOGRAM WITH TOMO AND CAD COMPARISON:  Previous exam(s). ACR Breast Density Category b: There are scattered areas of fibroglandular density. FINDINGS: There are no findings suspicious for malignancy. Images were processed with CAD. IMPRESSION: No mammographic evidence of malignancy. A result letter of this screening mammogram will be mailed directly to the patient. RECOMMENDATION: Screening mammogram in one year. (Code:SM-B-01Y)  BI-RADS CATEGORY  1: Negative. Electronically Signed   By: Fidela Salisbury M.D.   On: 07/04/2018 17:52     ROS: 14 pt review of systems performed and negative (unless mentioned in an HPI)  Objective: BP 123/83 (BP Location: Left Arm, Patient Position: Sitting, Cuff Size: Normal)   Pulse 73   Temp 98.2 F (36.8 C) (Oral)   Resp 16   Ht 5\' 3"  (1.6 m)   Wt 174 lb (78.9 kg)   SpO2 97%   BMI 30.82 kg/m  Gen: Afebrile. No acute distress. Nontoxic in appearance, well-developed, well-nourished,  Pleasant female HENT: AT. [place].  MMM Eyes:Pupils Equal Round Reactive to light,  Extraocular movements intact,  Conjunctiva without redness, discharge or icterus. Neck/lymp/endocrine: Supple,no lymphadenopathy, no thyromegaly CV: RRR no murmur, no edema, +2/4 P posterior tibialis pulses. no carotid bruits. No JVD. Chest: CTAB, no wheeze, rhonchi or crackles. normal Respiratory effort. good Air movement. Abd: Soft. NTND. BS present.  Skin:  Warm and well-perfused. Skin intact. Neuro/Msk:  Normal gait. PERLA. EOMi. Alert. Oriented x3.  Psych: Normal affect, dress and demeanor. Normal speech. Normal thought content and judgment.  Assessment/plan: Grace Schmitt is a 72 y.o. female present for TOC Dizziness/Urinary incontinence, unspecified type/. Diaphoresis/Lightheadedness - Uncertain etiology of her symptoms- discussed medication SE, cardiac and neurological causes. Postictal state described is concerning for ischemia or seizure.  - POCT Urinalysis Dipstick (Automated)-->normal sent for culture to be complete. - Urine Culture - CK (Creatine Kinase) - CK Total (and CKMB) - Magnesium - Iron, TIBC and Ferritin Panel - Close follow up after labs - consider neuro vs cardiology referrals. Has appt coming up already scheduled.   Note is dictated utilizing voice recognition software. Although note has been proof read prior to signing, occasional typographical errors still can be missed. If any  questions arise, please do not hesitate to call for verification.  Electronically signed by: Howard Pouch, DO [place]

## 2018-12-06 NOTE — Patient Instructions (Signed)
It was nice to meet you today.  If you  experience an episode, please go to ED immediately so labs and EKG can been obtained for accuracy.  We will call you with lab results and make a plan after we see the results.    Please help [place] help you:  We are honored you have chosen [facility] for your Primary Care home. Below you will find basic instructions that you may need to access in the future. Please help [place] help you by reading the instructions, which cover many of the frequent questions we experience.   Prescription refills and request:  -In order to allow more efficient response time, please call your pharmacy for all refills. They will forward the request electronically to [place]. This allows for the quickest possible response. Request left on a nurse line can take longer to refill, since these are checked as time allows between office patients and other phone calls.  - refill request can take up to 3-5 working days to complete.  - If request is sent electronically and request is appropiate, it is usually completed in 1-2 business days.  - all patients will need to be seen routinely for all chronic medical conditions requiring prescription medications (see follow-up below). If you are overdue for follow up on your condition, you will be asked to make an appointment and we will call in enough medication to cover you until your appointment (up to 30 days).  - all controlled substances will require a face to face visit to request/refill.  - if you desire your prescriptions to go through a new pharmacy, and have an active script at original pharmacy, you will need to call your pharmacy and have scripts transferred to new pharmacy. This is completed between the pharmacy locations and not by your provider.    Results: If any images or labs were ordered, it can take up to 1 week to get results depending on the test ordered and the lab/facility running and resulting the test. - Normal or stable  results, which do not need further discussion, may be released to your mychart immediately with attached note to you. A call may not be generated for normal results. Please make certain to sign up for mychart. If you have questions on how to activate your mychart you can call the front office.  - If your results need further discussion, our office will attempt to contact you via phone, and if unable to reach you after 2 attempts, we will release your abnormal result to your mychart with instructions.  - All results will be automatically released in mychart after 1 week.  - Your provider will provide you with explanation and instruction on all relevant material in your results. Please keep in mind, results and labs may appear confusing or abnormal to the untrained eye, but it does not mean they are actually abnormal for you personally. If you have any questions about your results that are not covered, or you desire more detailed explanation than what was provided, you should make an appointment with your provider to do so.   Our office handles many outgoing and incoming calls daily. If we have not contacted you within 1 week about your results, please check your mychart to see if there is a message first and if not, then contact our office.  In helping with this matter, you help decrease call volume, and therefore allow [place] to be able to respond to patients needs more efficiently.  Acute office visits (sick visit):  An acute visit is intended for a new problem and are scheduled in shorter time slots to allow schedule openings for patients with new problems. This is the appropriate visit to discuss a new problem. Problems will not be addressed by phone call or Echart message. Appointment is needed if requesting treatment. In order to provide you with excellent quality medical care with proper time for you to explain your problem, have an exam and receive treatment with instructions, these appointments should  be limited to one new problem per visit. If you experience a new problem, in which you desire to be addressed, please make an acute office visit, we save openings on the schedule to accommodate you. Please do not save your new problem for any other type of visit, let us take care of it properly and quickly for you.   Follow up visits:  Depending on your condition(s) your provider will need to see you routinely in order to provide you with quality care and prescribe medication(s). Most chronic conditions (Example: hypertension, Diabetes, depression/anxiety... etc), require visits a couple times a year. Your provider will instruct you on proper follow up for your personal medical conditions and history. Please make certain to make follow up appointments for your condition as instructed. Failing to do so could result in lapse in your medication treatment/refills. If you request a refill, and are overdue to be seen on a condition, we will always provide you with a 30 day script (once) to allow you time to schedule.    Medicare wellness (well visit): - we have a wonderful Nurse Grace Schmitt), that will meet with you and provide you will yearly medicare wellness visits. These visits should occur yearly (can not be scheduled less than 1 calendar year apart) and cover preventive health, immunizations, advance directives and screenings you are entitled to yearly through your medicare benefits. Do not miss out on your entitled benefits, this is when medicare will pay for these benefits to be ordered for you.  These are strongly encouraged by your provider and is the appropriate type of visit to make certain you are up to date with all preventive health benefits. If you have not had your medicare wellness exam in the last 12 months, please make certain to schedule one by calling the office and schedule your medicare wellness with Grace Schmitt as soon as possible.   Yearly physical (well visit):  - Adults are recommended to be seen  yearly for physicals. Check with your insurance and date of your last physical, most insurances require one calendar year between physicals. Physicals include all preventive health topics, screenings, medical exam and labs that are appropriate for gender/age and history. You may have fasting labs needed at this visit. This is a well visit (not a sick visit), new problems should not be covered during this visit (see acute visit).  - Pediatric patients are seen more frequently when they are younger. Your provider will advise you on well child visit timing that is appropriate for your their age. - This is not a medicare wellness visit. Medicare wellness exams do not have an exam portion to the visit. Some medicare companies allow for a physical, some do not allow a yearly physical. If your medicare allows a yearly physical you can schedule the medicare wellness with our nurse Grace Schmitt and have your physical with your provider after, on the same day. Please check with insurance for your full benefits.   Late Policy/No Shows:  -  all new patients should arrive 15-30 minutes earlier than appointment to allow [place] time  to  obtain all personal demographics,  insurance information and for you to complete office paperwork. - All established patients should arrive 10-15 minutes earlier than appointment time to update all information and be checked in .  - In our best efforts to run on time, if you are late for your appointment you will be asked to either reschedule or if able, we will work you back into the schedule. There will be a wait time to work you back in the schedule,  depending on availability.  - If you are unable to make it to your appointment as scheduled, please call 24 hours ahead of time to allow [place] to fill the time slot with someone else who needs to be seen. If you do not cancel your appointment ahead of time, you may be charged a no show fee.

## 2018-12-07 DIAGNOSIS — G4733 Obstructive sleep apnea (adult) (pediatric): Secondary | ICD-10-CM | POA: Diagnosis not present

## 2018-12-07 LAB — CK TOTAL AND CKMB (NOT AT ARMC)
CK, MB: <0.7 ng/mL (ref 0–5.0)
Total CK: 66 U/L (ref 29–143)

## 2018-12-07 LAB — IRON,TIBC AND FERRITIN PANEL
%SAT: 22 % (calc) (ref 16–45)
Ferritin: 66 ng/mL (ref 16–288)
Iron: 74 ug/dL (ref 45–160)
TIBC: 330 mcg/dL (calc) (ref 250–450)

## 2018-12-08 LAB — URINE CULTURE
MICRO NUMBER:: 91479972
Result:: NO GROWTH
SPECIMEN QUALITY:: ADEQUATE

## 2018-12-09 ENCOUNTER — Ambulatory Visit (INDEPENDENT_AMBULATORY_CARE_PROVIDER_SITE_OTHER): Payer: PPO | Admitting: Family Medicine

## 2018-12-09 ENCOUNTER — Encounter: Payer: Self-pay | Admitting: Family Medicine

## 2018-12-09 VITALS — BP 114/75 | HR 77 | Temp 98.2°F | Resp 16 | Ht 63.0 in | Wt 174.0 lb

## 2018-12-09 DIAGNOSIS — R32 Unspecified urinary incontinence: Secondary | ICD-10-CM

## 2018-12-09 DIAGNOSIS — R61 Generalized hyperhidrosis: Secondary | ICD-10-CM

## 2018-12-09 DIAGNOSIS — R42 Dizziness and giddiness: Secondary | ICD-10-CM | POA: Diagnosis not present

## 2018-12-09 DIAGNOSIS — E039 Hypothyroidism, unspecified: Secondary | ICD-10-CM

## 2018-12-09 MED ORDER — LEVOTHYROXINE SODIUM 50 MCG PO TABS: ORAL_TABLET | ORAL | 3 refills | Status: DC

## 2018-12-09 NOTE — Progress Notes (Signed)
Patient ID: Grace Schmitt, female  DOB: 12-05-1946, 72 y.o.   MRN: [id number] Patient Care Team    Relationship Specialty Notifications Start End  Ma Hillock, DO PCP - General Family Medicine  12/06/18   Juanito Doom, MD Consulting Physician Pulmonary Disease  12/09/18   Lorretta Harp, MD Consulting Physician Cardiology  12/09/18   Cain Sieve, MD Referring Physician Neurology  12/09/18    Comment: RLS only.   Delrae Rend, MD Consulting Physician Endocrinology  12/09/18   Odette Fraction  Optometry  12/09/18    Comment: Dr. Ebony Hail - [facility] [place]  Gatha Mayer, MD Consulting Physician Gastroenterology  12/09/18     Chief Complaint  Patient presents with  . Follow-up    TOC     Subjective:  Grace Schmitt is a 72 y.o.  female present for follow up on her lightheadedness. Her labs were normal Ck, CKMB, iron panel ad urine studies. She has not had another event since last Wednesday.   Prior  Note:  pt presents today for dizziness. She states it is the type of feeling one gets when you pass out, but she has never lost consciousness with it. It is not room spinning. She reports it has been occurring for past 2 months, but worsened last week. She reports last Tuesday she had 4-5 episodes that day. Last episode was Wednesday (6 days ago). Each episode last about 10 minutes and more recently has been associated with diaphoresis and loss of bladder control. She denies MSK movements, twitches or convulsions. She denies shortness of breath, but has had "chest tightness" after episodes sometimes- but not recently. Prior to last Tuesday episodes were a dew times a month only. She also states she can feel "wiped out" for a few days after event. She has a history of sarcoid, all labs reviewed from 11/15/219 had normal calcium, electrolytes, WBC, TSH. CT 01/20/2018 appeared improved from prior. Cardiac stress test 2017 with Dr. Gwenlyn Found- normal- low risk study.  Carotid duplex 07/2017 bilateral 1-39% ICA plaquing. Echo 07/2017 CH88-50%, diastolic 1 dysfunction. She denies any medication changes over the last month. She is on methadone For RLS. She infrequently takes the mirapex and has not taken it in over a month. She takes a magnesium supplement. She is prescribed cymbalta- dose unchanged. She has normal kidney and liver function.   Depression screen [facility] 2/9 12/09/2018 07/26/2018 08/25/2017 06/05/2016 04/30/2015  Decreased Interest 0 0 0 0 0  Down, Depressed, Hopeless 0 0 0 0 0  PHQ - 2 Score 0 0 0 0 0  Altered sleeping 1 - - - -  Tired, decreased energy 1 - - - -  Change in appetite 0 - - - -  Feeling bad or failure about yourself  0 - - - -  Trouble concentrating 1 - - - -  Moving slowly or fidgety/restless 0 - - - -  Suicidal thoughts 0 - - - -  PHQ-9 Score 3 - - - -  Difficult doing work/chores Not difficult at all - - - -   GAD 7 : Generalized Anxiety Score 12/09/2018  Nervous, Anxious, on Edge 1  Control/stop worrying 0  Worry too much - different things 1  Trouble relaxing 0  Restless 0  Easily annoyed or irritable 0  Afraid - awful might happen 0  Total GAD 7 Score 2  Anxiety Difficulty Not difficult at all       Fall  Risk  12/06/2018 07/26/2018 06/05/2016 04/30/2015 01/22/2015  Falls in the past year? 0 No No No No  Follow up Falls evaluation completed - - - -    Immunization History  Administered Date(s) Administered  . Influenza Whole 09/26/2009  . Influenza, High Dose Seasonal PF 09/12/2014, 01/06/2016, 08/25/2017, 10/05/2018  . Influenza-Unspecified 09/27/2012, 09/08/2013, 08/28/2014, 12/31/2015, 10/16/2016, 08/25/2017  . PPD Test 08/31/2017  . Pneumococcal Conjugate-13 06/06/2015  . Pneumococcal Polysaccharide-23 01/22/2011, 03/29/2011, 08/25/2017  . Tdap 05/05/2011  . Zoster 05/05/2011    No exam data present  Past Medical History:  Diagnosis Date  . Allergy    prn claritin  . Depression   . Fibromyalgia   . Headache     hx of none since menopause  . Hepatitis   . Hx of colonic polyps serrated and adenomatous 05/28/2005  . Hyperlipidemia   . Hypothyroidism   . Ischemic chest pain ([place])   . PONV (postoperative nausea and vomiting)   . Restless leg syndrome   . Rheumatic fever   . Sarcoidosis    lung mass  . Thyroid disease    Allergies  Allergen Reactions  . Phenergan [Promethazine Hcl] Other (See Comments)    Pt has restless leg syndrome and the phenergan cause the RLS  to worsen    Past Surgical History:  Procedure Laterality Date  . COLONOSCOPY  October 2011  . ENDOBRONCHIAL ULTRASOUND Bilateral 09/20/2017   Procedure: ENDOBRONCHIAL ULTRASOUND;  Surgeon: Juanito Doom, MD;  Location: [facility] ENDOSCOPY;  Service: Cardiopulmonary;  Laterality: Bilateral;  . TONSILLECTOMY  1951  . TUBAL LIGATION  1978   x2   Family History  Problem Relation Age of Onset  . Dementia Mother   . Heart disease Mother        smal vessel disease  . Hyperlipidemia Mother   . Hypertension Brother   . Cancer Brother   . Heart disease Brother   . Hyperlipidemia Brother   . Cancer Father   . Heart disease Father   . Hyperlipidemia Father   . Hypertension Father   . Kidney disease Father   . Heart attack Paternal Grandfather   . Colon cancer Neg Hx   . Colon polyps Neg Hx   . Breast cancer Neg Hx    Social History   Socioeconomic History  . Marital status: Married    Spouse name: Not on file  . Number of children: Not on file  . Years of education: Not on file  . Highest education level: Not on file  Occupational History  . Not on file  Social Needs  . Financial resource strain: Not on file  . Food insecurity:    Worry: Not on file    Inability: Not on file  . Transportation needs:    Medical: Not on file    Non-medical: Not on file  Tobacco Use  . Smoking status: Never Smoker  . Smokeless tobacco: Never Used  Substance and Sexual Activity  . Alcohol use: No    Alcohol/week: 0.0 standard  drinks  . Drug use: No  . Sexual activity: Not Currently    Partners: Male  Lifestyle  . Physical activity:    Days per week: Not on file    Minutes per session: Not on file  . Stress: Not on file  Relationships  . Social connections:    Talks on phone: Not on file    Gets together: Not on file    Attends religious service: Not on file  Active member of club or organization: Not on file    Attends meetings of clubs or organizations: Not on file    Relationship status: Not on file  . Intimate partner violence:    Fear of current or ex partner: Not on file    Emotionally abused: Not on file    Physically abused: Not on file    Forced sexual activity: Not on file  Other Topics Concern  . Not on file  Social History Narrative   Marital status/children/pets: married   Education/employment: HS education. Housewife.    Safety:      -Wears a bicycle helmet riding a bike: Yes     -smoke alarm in the home:Yes     - wears seatbelt: Yes     - Feels safe in their relationships: Yes   Allergies as of 12/09/2018      Reactions   Phenergan [promethazine Hcl] Other (See Comments)   Pt has restless leg syndrome and the phenergan cause the RLS  to worsen       Medication List       Accurate as of December 09, 2018  6:02 PM. Always use your most recent med list.        Azelaic Acid 15 % cream After skin is thoroughly washed and patted dry, gently but thoroughly massage a thin film of azelaic acid cream into the affected area twice daily, in the morning and evening.   BENEFIBER PO Take 1 Dose by mouth at bedtime.   co-enzyme Q-10 30 MG capsule Take by mouth daily.   DULoxetine 30 MG capsule Commonly known as:  CYMBALTA Take 60 mg by mouth daily with breakfast.   IRON (FERROUS SULFATE) PO Take 1 tablet by mouth daily.   levothyroxine 50 MCG tablet Commonly known as:  SYNTHROID, LEVOTHROID TAKE 1 TABLET (50 MCG TOTAL) BY MOUTH DAILY.   loratadine 10 MG tablet Commonly  known as:  CLARITIN Take 10 mg by mouth at bedtime.   LORazepam 0.5 MG tablet Commonly known as:  ATIVAN Take 0.25-0.5 mg by mouth at bedtime as needed for anxiety (for restless leg/sleep.).   methadone 5 MG tablet Commonly known as:  DOLOPHINE Take 7.5 mg by mouth daily at 6 PM. For restless legs   multivitamin with minerals Tabs tablet Take 1 tablet by mouth 2 (two) times daily.   NAUZENE [phone] MG Chew Generic drug:  Dextrose-Fructose-Sod Citrate Chew 2 tablets by mouth 2 (two) times daily as needed (nausea/vomiting).   pramipexole 0.125 MG tablet Commonly known as:  MIRAPEX Take 1 tablet by mouth daily.   sennosides-docusate sodium 8.6-50 MG tablet Commonly known as:  SENOKOT-S Take 1 tablet by mouth 2 (two) times daily as needed for constipation.   STOPAIN EX Apply 1 spray topically 3 (three) times daily as needed (for legs pain).   vitamin C 500 MG tablet Commonly known as:  ASCORBIC ACID Take 500 mg by mouth daily.       All past medical history, surgical history, allergies, family history, immunizations andmedications were updated in the EMR today and reviewed under the history and medication portions of their EMR.    Recent Results (from the past 2160 hour(s))  Basic metabolic panel     Status: Abnormal   Collection Time: 11/11/18 10:53 AM  Result Value Ref Range   Sodium 139 135 - 145 mEq/L   Potassium 4.5 3.5 - 5.1 mEq/L   Chloride 101 96 - 112 mEq/L   CO2 29  19 - 32 mEq/L   Glucose, Bld 110 (H) 70 - 99 mg/dL   BUN 23 6 - 23 mg/dL   Creatinine, Ser 0.75 0.40 - 1.20 mg/dL   Calcium 9.7 8.4 - 10.5 mg/dL   GFR 80.40 >60.00 mL/min  CBC     Status: None   Collection Time: 11/11/18 10:53 AM  Result Value Ref Range   WBC 9.3 4.0 - 10.5 K/uL   RBC 4.47 3.87 - 5.11 Mil/uL   Platelets 324.0 150.0 - 400.0 K/uL   Hemoglobin 14.0 12.0 - 15.0 g/dL   HCT 41.9 36.0 - 46.0 %   MCV 93.9 78.0 - 100.0 fl   MCHC 33.3 30.0 - 36.0 g/dL   RDW 13.1 11.5 - 15.5 %    Potassium     Status: None   Collection Time: 11/11/18 10:53 AM  Result Value Ref Range   Potassium 4.5 3.5 - 5.1 mEq/L  Magnesium     Status: None   Collection Time: 11/11/18 10:53 AM  Result Value Ref Range   Magnesium 2.3 1.5 - 2.5 mg/dL  Urine Culture     Status: None   Collection Time: 11/11/18 10:53 AM  Result Value Ref Range   MICRO NUMBER: [id number]    SPECIMEN QUALITY: ADEQUATE    Sample Source NOT GIVEN    STATUS: FINAL    Result: No Growth   Urinalysis     Status: Abnormal   Collection Time: 11/11/18 10:53 AM  Result Value Ref Range   Color, Urine YELLOW Yellow;Lt. Yellow   APPearance CLEAR Clear   Specific Gravity, Urine 1.015 1.000 - 1.030   pH 6.0 5.0 - 8.0   Total Protein, Urine NEGATIVE Negative   Urine Glucose NEGATIVE Negative   Ketones, ur NEGATIVE Negative   Bilirubin Urine NEGATIVE Negative   Hgb urine dipstick TRACE-INTACT (A) Negative   Urobilinogen, UA 0.2 0.0 - 1.0   Leukocytes, UA NEGATIVE Negative   Nitrite NEGATIVE Negative  POCT Urinalysis Dipstick (Automated)     Status: Normal   Collection Time: 12/06/18 11:02 AM  Result Value Ref Range   Color, UA yellow    Clarity, UA clear    Glucose, UA Negative Negative   Bilirubin, UA Negative    Ketones, UA Negative    Spec Grav, UA 1.015 1.010 - 1.025   Blood, UA Negative    pH, UA 6.0 5.0 - 8.0   Protein, UA Negative Negative   Urobilinogen, UA 0.2 0.2 or 1.0 E.U./dL   Nitrite, UA Negative    Leukocytes, UA Negative Negative  Urine Culture     Status: None   Collection Time: 12/06/18 11:35 AM  Result Value Ref Range   MICRO NUMBER: [id number]    SPECIMEN QUALITY: Adequate    Sample Source URINE    STATUS: FINAL    Result: No Growth   CK (Creatine Kinase)     Status: None   Collection Time: 12/06/18 11:35 AM  Result Value Ref Range   Total CK 57 7 - 177 U/L  CK Total (and CKMB)     Status: None   Collection Time: 12/06/18 11:35 AM  Result Value Ref Range   Total CK 66 29 - 143 U/L    CK, MB <0.7 0 - 5.0 ng/mL   Relative Index  0 - 4.0    Comment: Result not calculated because one or more required values exceed analytical limits.   Magnesium     Status: None   Collection  Time: 12/06/18 11:35 AM  Result Value Ref Range   Magnesium 2.5 1.5 - 2.5 mg/dL  Iron, TIBC and Ferritin Panel     Status: None   Collection Time: 12/06/18 11:35 AM  Result Value Ref Range   Iron 74 45 - 160 mcg/dL   TIBC 330 250 - 450 mcg/dL (calc)   %SAT 22 16 - 45 % (calc)   Ferritin 66 16 - 288 ng/mL    ROS: 14 pt review of systems performed and negative (unless mentioned in an HPI)  Objective: BP 114/75 (BP Location: Left Arm, Patient Position: Sitting, Cuff Size: Normal)   Pulse 77   Temp 98.2 F (36.8 C) (Oral)   Resp 16   Ht 5\' 3"  (1.6 m)   Wt 174 lb (78.9 kg)   SpO2 96%   BMI 30.82 kg/m  Gen: Afebrile. No acute distress. Nontoxic - pleasant mildly obese female.  HENT: AT. [place].  MMM.  Eyes:Pupils Equal Round Reactive to light, Extraocular movements intact,  Conjunctiva without redness, discharge or icterus. Neck/lymp/endocrine: Supple,no lymphadenopathy, no thyromegaly CV: RRR no murmur, no edema, +2/4 P posterior tibialis pulses Chest: CTAB, no wheeze or crackles Abd: Soft. NTND. BS present . no Masses palpated.  Skin: no rashes, purpura or petechiae.  Neuro:  Normal gait. PERLA. EOMi. Alert. Oriented x3  Psych: Normal affect, dress and demeanor. Normal speech. Normal thought content and judgment. EKG: SR. HR 80, PR 154, QTc 441. occasional ectopic  Beat. No ST changes, unchanged from prior ekg.    Assessment/plan: Grace Schmitt is a 72 y.o. female present for TOC Dizziness/Urinary incontinence, unspecified type Diaphoresis/Lightheadedness - Still Uncertain etiology of her symptoms- discussed medication SE, cardiac and neurological causes. Postictal state described is concerning for ischemia or seizure. She has a rather strong fhx of heart disease and past h/o ischemic  chest pain.  - EKG completed today unchanged from prior. CKMB normal- although it was 6 days after the last "event". Given diaphoresis and fatigue that can last a few days after events- feel cardiology evaluation is warranted- especially with erh personal and fhx- pt agrees. Referral placed today.  - loss of bladder control and fatigue that last days after events could also be associated with seizure or neurological disorder. She is on methadone from another neurologist for her RLS- SE potential is lightheadedness and seizure- pt is agreeable to neurology referral locally for the eval and recs on her events- referral placed today. -- Magnesium- her mag was 2.5, upper limit of normal. Advised her to cut back on her mag supplement- skip 2 days a week.  - Iron, TIBC and Ferritin Panel - Hypothyroid: refilled her synthroid in my name since her doc retired. TSH up to date 06/2018.  - she is aware to report to ED immediately if symptoms return so labs and ekg can be completed at the time of these events.  - f/u if needed before referral appts. And when CPE due.    Note is dictated utilizing voice recognition software. Although note has been proof read prior to signing, occasional typographical errors still can be missed. If any questions arise, please do not hesitate to call for verification.  Electronically signed by: Howard Pouch, DO [place]

## 2018-12-09 NOTE — Patient Instructions (Addendum)
I have referred you to cardiology and neurology for evaluation. If you have symptoms before you able to get into specialist please go to ED.  Your symptoms can be consistent with seizure activity or cardiac issues.  Cut back on the magnesium- to about 5x a week instead of 7 days.   Follow up yearly for your physical. Sooner if needed.   Please help [place] help you:  We are honored you have chosen [facility] for your Primary Care home. Below you will find basic instructions that you may need to access in the future. Please help [place] help you by reading the instructions, which cover many of the frequent questions we experience.   Prescription refills and request:  -In order to allow more efficient response time, please call your pharmacy for all refills. They will forward the request electronically to [place]. This allows for the quickest possible response. Request left on a nurse line can take longer to refill, since these are checked as time allows between office patients and other phone calls.  - refill request can take up to 3-5 working days to complete.  - If request is sent electronically and request is appropiate, it is usually completed in 1-2 business days.  - all patients will need to be seen routinely for all chronic medical conditions requiring prescription medications (see follow-up below). If you are overdue for follow up on your condition, you will be asked to make an appointment and we will call in enough medication to cover you until your appointment (up to 30 days).  - all controlled substances will require a face to face visit to request/refill.  - if you desire your prescriptions to go through a new pharmacy, and have an active script at original pharmacy, you will need to call your pharmacy and have scripts transferred to new pharmacy. This is completed between the pharmacy locations and not by your provider.    Results: If any images or labs were ordered, it can take up to 1 week to  get results depending on the test ordered and the lab/facility running and resulting the test. - Normal or stable results, which do not need further discussion, may be released to your mychart immediately with attached note to you. A call may not be generated for normal results. Please make certain to sign up for mychart. If you have questions on how to activate your mychart you can call the front office.  - If your results need further discussion, our office will attempt to contact you via phone, and if unable to reach you after 2 attempts, we will release your abnormal result to your mychart with instructions.  - All results will be automatically released in mychart after 1 week.  - Your provider will provide you with explanation and instruction on all relevant material in your results. Please keep in mind, results and labs may appear confusing or abnormal to the untrained eye, but it does not mean they are actually abnormal for you personally. If you have any questions about your results that are not covered, or you desire more detailed explanation than what was provided, you should make an appointment with your provider to do so.   Our office handles many outgoing and incoming calls daily. If we have not contacted you within 1 week about your results, please check your mychart to see if there is a message first and if not, then contact our office.  In helping with this matter, you help decrease  call volume, and therefore allow [place] to be able to respond to patients needs more efficiently.   Acute office visits (sick visit):  An acute visit is intended for a new problem and are scheduled in shorter time slots to allow schedule openings for patients with new problems. This is the appropriate visit to discuss a new problem. Problems will not be addressed by phone call or Echart message. Appointment is needed if requesting treatment. In order to provide you with excellent quality medical care with proper time  for you to explain your problem, have an exam and receive treatment with instructions, these appointments should be limited to one new problem per visit. If you experience a new problem, in which you desire to be addressed, please make an acute office visit, we save openings on the schedule to accommodate you. Please do not save your new problem for any other type of visit, let us take care of it properly and quickly for you.   Follow up visits:  Depending on your condition(s) your provider will need to see you routinely in order to provide you with quality care and prescribe medication(s). Most chronic conditions (Example: hypertension, Diabetes, depression/anxiety... etc), require visits a couple times a year. Your provider will instruct you on proper follow up for your personal medical conditions and history. Please make certain to make follow up appointments for your condition as instructed. Failing to do so could result in lapse in your medication treatment/refills. If you request a refill, and are overdue to be seen on a condition, we will always provide you with a 30 day script (once) to allow you time to schedule.    Medicare wellness (well visit): - we have a wonderful Nurse Maudie Mercury), that will meet with you and provide you will yearly medicare wellness visits. These visits should occur yearly (can not be scheduled less than 1 calendar year apart) and cover preventive health, immunizations, advance directives and screenings you are entitled to yearly through your medicare benefits. Do not miss out on your entitled benefits, this is when medicare will pay for these benefits to be ordered for you.  These are strongly encouraged by your provider and is the appropriate type of visit to make certain you are up to date with all preventive health benefits. If you have not had your medicare wellness exam in the last 12 months, please make certain to schedule one by calling the office and schedule your medicare  wellness with Maudie Mercury as soon as possible.   Yearly physical (well visit):  - Adults are recommended to be seen yearly for physicals. Check with your insurance and date of your last physical, most insurances require one calendar year between physicals. Physicals include all preventive health topics, screenings, medical exam and labs that are appropriate for gender/age and history. You may have fasting labs needed at this visit. This is a well visit (not a sick visit), new problems should not be covered during this visit (see acute visit).  - Pediatric patients are seen more frequently when they are younger. Your provider will advise you on well child visit timing that is appropriate for your their age. - This is not a medicare wellness visit. Medicare wellness exams do not have an exam portion to the visit. Some medicare companies allow for a physical, some do not allow a yearly physical. If your medicare allows a yearly physical you can schedule the medicare wellness with our nurse Maudie Mercury and have your physical with your provider after,  on the same day. Please check with insurance for your full benefits.   Late Policy/No Shows:  - all new patients should arrive 15-30 minutes earlier than appointment to allow [place] time  to  obtain all personal demographics,  insurance information and for you to complete office paperwork. - All established patients should arrive 10-15 minutes earlier than appointment time to update all information and be checked in .  - In our best efforts to run on time, if you are late for your appointment you will be asked to either reschedule or if able, we will work you back into the schedule. There will be a wait time to work you back in the schedule,  depending on availability.  - If you are unable to make it to your appointment as scheduled, please call 24 hours ahead of time to allow [place] to fill the time slot with someone else who needs to be seen. If you do not cancel your appointment  ahead of time, you may be charged a no show fee.

## 2018-12-11 ENCOUNTER — Encounter: Payer: Self-pay | Admitting: Primary Care

## 2018-12-11 NOTE — Assessment & Plan Note (Addendum)
-   Improved, sleeping better since pressure was increased  - Awaiting chin strap

## 2018-12-11 NOTE — Assessment & Plan Note (Signed)
-   Stable; 9.7 on 11/15 BEMT

## 2018-12-11 NOTE — Assessment & Plan Note (Addendum)
-   New incontinence  - Labs and urine culture normal  - Encouraged patient to establish with new PCP and call neurologist with [facility] to make an apt

## 2018-12-12 ENCOUNTER — Emergency Department (HOSPITAL_BASED_OUTPATIENT_CLINIC_OR_DEPARTMENT_OTHER): Payer: No Typology Code available for payment source

## 2018-12-12 ENCOUNTER — Inpatient Hospital Stay (HOSPITAL_BASED_OUTPATIENT_CLINIC_OR_DEPARTMENT_OTHER)
Admission: EM | Admit: 2018-12-12 | Discharge: 2018-12-20 | DRG: 493 | Disposition: A | Discharge status: Home / Self Care | Payer: No Typology Code available for payment source | Attending: Orthopedic Surgery | Admitting: Orthopedic Surgery

## 2018-12-12 ENCOUNTER — Other Ambulatory Visit: Payer: Self-pay

## 2018-12-12 ENCOUNTER — Encounter (HOSPITAL_BASED_OUTPATIENT_CLINIC_OR_DEPARTMENT_OTHER): Payer: Self-pay | Admitting: *Deleted

## 2018-12-12 DIAGNOSIS — S82891A Other fracture of right lower leg, initial encounter for closed fracture: Secondary | ICD-10-CM | POA: Diagnosis not present

## 2018-12-12 DIAGNOSIS — S82841A Displaced bimalleolar fracture of right lower leg, initial encounter for closed fracture: Secondary | ICD-10-CM | POA: Diagnosis present

## 2018-12-12 DIAGNOSIS — S2220XA Unspecified fracture of sternum, initial encounter for closed fracture: Secondary | ICD-10-CM | POA: Diagnosis present

## 2018-12-12 DIAGNOSIS — S322XXA Fracture of coccyx, initial encounter for closed fracture: Secondary | ICD-10-CM | POA: Diagnosis present

## 2018-12-12 DIAGNOSIS — S3991XA Unspecified injury of abdomen, initial encounter: Secondary | ICD-10-CM | POA: Diagnosis not present

## 2018-12-12 DIAGNOSIS — G8929 Other chronic pain: Secondary | ICD-10-CM | POA: Diagnosis present

## 2018-12-12 DIAGNOSIS — G2581 Restless legs syndrome: Secondary | ICD-10-CM | POA: Diagnosis present

## 2018-12-12 DIAGNOSIS — S2001XA Contusion of right breast, initial encounter: Secondary | ICD-10-CM | POA: Diagnosis present

## 2018-12-12 DIAGNOSIS — Z79890 Hormone replacement therapy (postmenopausal): Secondary | ICD-10-CM

## 2018-12-12 DIAGNOSIS — S22019A Unspecified fracture of first thoracic vertebra, initial encounter for closed fracture: Secondary | ICD-10-CM | POA: Diagnosis present

## 2018-12-12 DIAGNOSIS — Z841 Family history of disorders of kidney and ureter: Secondary | ICD-10-CM

## 2018-12-12 DIAGNOSIS — S199XXA Unspecified injury of neck, initial encounter: Secondary | ICD-10-CM | POA: Diagnosis not present

## 2018-12-12 DIAGNOSIS — E785 Hyperlipidemia, unspecified: Secondary | ICD-10-CM | POA: Diagnosis not present

## 2018-12-12 DIAGNOSIS — E039 Hypothyroidism, unspecified: Secondary | ICD-10-CM | POA: Diagnosis present

## 2018-12-12 DIAGNOSIS — R52 Pain, unspecified: Secondary | ICD-10-CM

## 2018-12-12 DIAGNOSIS — S3992XA Unspecified injury of lower back, initial encounter: Secondary | ICD-10-CM | POA: Diagnosis not present

## 2018-12-12 DIAGNOSIS — Z9851 Tubal ligation status: Secondary | ICD-10-CM

## 2018-12-12 DIAGNOSIS — Y92410 Unspecified street and highway as the place of occurrence of the external cause: Secondary | ICD-10-CM | POA: Diagnosis not present

## 2018-12-12 DIAGNOSIS — G8918 Other acute postprocedural pain: Secondary | ICD-10-CM | POA: Diagnosis not present

## 2018-12-12 DIAGNOSIS — S0990XA Unspecified injury of head, initial encounter: Secondary | ICD-10-CM | POA: Diagnosis not present

## 2018-12-12 DIAGNOSIS — W2211XA Striking against or struck by driver side automobile airbag, initial encounter: Secondary | ICD-10-CM | POA: Diagnosis not present

## 2018-12-12 DIAGNOSIS — Z888 Allergy status to other drugs, medicaments and biological substances status: Secondary | ICD-10-CM

## 2018-12-12 DIAGNOSIS — S2239XA Fracture of one rib, unspecified side, initial encounter for closed fracture: Secondary | ICD-10-CM | POA: Diagnosis present

## 2018-12-12 DIAGNOSIS — R51 Headache: Secondary | ICD-10-CM | POA: Diagnosis not present

## 2018-12-12 DIAGNOSIS — Z8249 Family history of ischemic heart disease and other diseases of the circulatory system: Secondary | ICD-10-CM

## 2018-12-12 DIAGNOSIS — M8580 Other specified disorders of bone density and structure, unspecified site: Secondary | ICD-10-CM | POA: Diagnosis present

## 2018-12-12 DIAGNOSIS — Z86010 Personal history of colonic polyps: Secondary | ICD-10-CM | POA: Diagnosis not present

## 2018-12-12 DIAGNOSIS — D62 Acute posthemorrhagic anemia: Secondary | ICD-10-CM | POA: Diagnosis present

## 2018-12-12 DIAGNOSIS — Z9089 Acquired absence of other organs: Secondary | ICD-10-CM

## 2018-12-12 DIAGNOSIS — M542 Cervicalgia: Secondary | ICD-10-CM | POA: Diagnosis not present

## 2018-12-12 DIAGNOSIS — F329 Major depressive disorder, single episode, unspecified: Secondary | ICD-10-CM | POA: Diagnosis present

## 2018-12-12 DIAGNOSIS — M797 Fibromyalgia: Secondary | ICD-10-CM | POA: Diagnosis present

## 2018-12-12 DIAGNOSIS — K5900 Constipation, unspecified: Secondary | ICD-10-CM | POA: Diagnosis present

## 2018-12-12 DIAGNOSIS — S22010A Wedge compression fracture of first thoracic vertebra, initial encounter for closed fracture: Secondary | ICD-10-CM | POA: Diagnosis not present

## 2018-12-12 DIAGNOSIS — S20219A Contusion of unspecified front wall of thorax, initial encounter: Secondary | ICD-10-CM

## 2018-12-12 DIAGNOSIS — R109 Unspecified abdominal pain: Secondary | ICD-10-CM | POA: Diagnosis not present

## 2018-12-12 DIAGNOSIS — S8291XA Unspecified fracture of right lower leg, initial encounter for closed fracture: Secondary | ICD-10-CM | POA: Diagnosis not present

## 2018-12-12 DIAGNOSIS — Z8349 Family history of other endocrine, nutritional and metabolic diseases: Secondary | ICD-10-CM

## 2018-12-12 DIAGNOSIS — M545 Low back pain: Secondary | ICD-10-CM | POA: Diagnosis not present

## 2018-12-12 DIAGNOSIS — S12690A Other displaced fracture of seventh cervical vertebra, initial encounter for closed fracture: Secondary | ICD-10-CM | POA: Diagnosis not present

## 2018-12-12 DIAGNOSIS — S3993XA Unspecified injury of pelvis, initial encounter: Secondary | ICD-10-CM | POA: Diagnosis not present

## 2018-12-12 DIAGNOSIS — S8261XA Displaced fracture of lateral malleolus of right fibula, initial encounter for closed fracture: Secondary | ICD-10-CM | POA: Diagnosis not present

## 2018-12-12 DIAGNOSIS — S82421A Displaced transverse fracture of shaft of right fibula, initial encounter for closed fracture: Secondary | ICD-10-CM | POA: Diagnosis not present

## 2018-12-12 DIAGNOSIS — R079 Chest pain, unspecified: Secondary | ICD-10-CM | POA: Diagnosis not present

## 2018-12-12 DIAGNOSIS — S42013A Anterior displaced fracture of sternal end of unspecified clavicle, initial encounter for closed fracture: Secondary | ICD-10-CM | POA: Diagnosis not present

## 2018-12-12 DIAGNOSIS — S299XXA Unspecified injury of thorax, initial encounter: Secondary | ICD-10-CM | POA: Diagnosis not present

## 2018-12-12 DIAGNOSIS — S8992XA Unspecified injury of left lower leg, initial encounter: Secondary | ICD-10-CM | POA: Diagnosis not present

## 2018-12-12 DIAGNOSIS — M546 Pain in thoracic spine: Secondary | ICD-10-CM | POA: Diagnosis not present

## 2018-12-12 LAB — CBC
HCT: 38.5 % (ref 36.0–46.0)
Hemoglobin: 12.1 g/dL (ref 12.0–15.0)
MCH: 30.6 pg (ref 26.0–34.0)
MCHC: 31.4 g/dL (ref 30.0–36.0)
MCV: 97.2 fL (ref 80.0–100.0)
Platelets: 247 10*3/uL (ref 150–400)
RBC: 3.96 MIL/uL (ref 3.87–5.11)
RDW: 12.4 % (ref 11.5–15.5)
WBC: 22.8 10*3/uL — ABNORMAL HIGH (ref 4.0–10.5)
nRBC: 0 % (ref 0.0–0.2)

## 2018-12-12 LAB — COMPREHENSIVE METABOLIC PANEL
ALT: 27 U/L (ref 0–44)
AST: 42 U/L — ABNORMAL HIGH (ref 15–41)
Albumin: 3.8 g/dL (ref 3.5–5.0)
Alkaline Phosphatase: 76 U/L (ref 38–126)
Anion gap: 8 (ref 5–15)
BUN: 24 mg/dL — ABNORMAL HIGH (ref 8–23)
CO2: 26 mmol/L (ref 22–32)
Calcium: 8.8 mg/dL — ABNORMAL LOW (ref 8.9–10.3)
Chloride: 101 mmol/L (ref 98–111)
Creatinine, Ser: 0.8 mg/dL (ref 0.44–1.00)
GFR calc Af Amer: >60 mL/min (ref >60)
GFR calc non Af Amer: >60 mL/min (ref >60)
Glucose, Bld: 148 mg/dL — ABNORMAL HIGH (ref 70–99)
Potassium: 4.3 mmol/L (ref 3.5–5.1)
Sodium: 135 mmol/L (ref 135–145)
Total Bilirubin: 0.4 mg/dL (ref 0.3–1.2)
Total Protein: 6.8 g/dL (ref 6.5–8.1)

## 2018-12-12 LAB — URINALYSIS, ROUTINE W REFLEX MICROSCOPIC
Bilirubin Urine: NEGATIVE
Glucose, UA: NEGATIVE mg/dL
Hgb urine dipstick: NEGATIVE
Ketones, ur: NEGATIVE mg/dL
Leukocytes, UA: NEGATIVE
Nitrite: NEGATIVE
Protein, ur: NEGATIVE mg/dL
Specific Gravity, Urine: <1.005 — ABNORMAL LOW (ref 1.005–1.030)
pH: 7.5 (ref 5.0–8.0)

## 2018-12-12 MED ORDER — FENTANYL CITRATE (PF) 100 MCG/2ML IJ SOLN
50.0000 ug | Freq: Once | INTRAMUSCULAR | Status: AC
  Administered 2018-12-13: 50 ug via INTRAVENOUS
  Filled 2018-12-12: qty 2

## 2018-12-12 MED ORDER — ONDANSETRON 4 MG PO TBDP
4.0000 mg | ORAL_TABLET | Freq: Once | ORAL | Status: AC
  Administered 2018-12-12: 4 mg via ORAL
  Filled 2018-12-12: qty 1

## 2018-12-12 MED ORDER — FENTANYL CITRATE (PF) 100 MCG/2ML IJ SOLN
50.0000 ug | Freq: Once | INTRAMUSCULAR | Status: AC
  Administered 2018-12-12: 50 ug via INTRAVENOUS
  Filled 2018-12-12: qty 2

## 2018-12-12 MED ORDER — IOPAMIDOL (ISOVUE-300) INJECTION 61%
100.0000 mL | Freq: Once | INTRAVENOUS | Status: AC | PRN
  Administered 2018-12-12: 100 mL via INTRAVENOUS

## 2018-12-12 MED ORDER — LACTATED RINGERS IV BOLUS
1000.0000 mL | Freq: Once | INTRAVENOUS | Status: AC
  Administered 2018-12-12: 1000 mL via INTRAVENOUS
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
## 2018-12-12 NOTE — ED Notes (Signed)
Purple bruising noted right breast. Left clavicle. Lower abdomen bilateral. And Right ankle area. Pt awake, alert, no sob. Lungs clr bilateral . vss

## 2018-12-12 NOTE — ED Notes (Signed)
Pt to CT with this RN to monitor 

## 2018-12-12 NOTE — ED Notes (Signed)
ED Provider at bedside. 

## 2018-12-12 NOTE — ED Notes (Addendum)
Pt states she got nauseated after using the aromatherapy inhaler. She is pale and diaphoretic. EKG ordered and pt told not to stop the aromatherapy.

## 2018-12-12 NOTE — ED Notes (Signed)
C-collar was placed.

## 2018-12-12 NOTE — ED Notes (Signed)
[facility] notified [name]) - patient ready for transport to [facility] ED
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
## 2018-12-12 NOTE — ED Notes (Signed)
Patient transported to CT, With RN and Monitor

## 2018-12-12 NOTE — ED Notes (Signed)
Pt given coffee grounds to smell as treatment for nausea from aromatherapy. Also see MAR for further treatment.

## 2018-12-12 NOTE — ED Notes (Signed)
Husband states his wife is nauseated. Emesis bag given. No active vomiting.

## 2018-12-13 ENCOUNTER — Inpatient Hospital Stay (HOSPITAL_COMMUNITY): Payer: No Typology Code available for payment source

## 2018-12-13 DIAGNOSIS — G2581 Restless legs syndrome: Secondary | ICD-10-CM | POA: Diagnosis present

## 2018-12-13 DIAGNOSIS — M797 Fibromyalgia: Secondary | ICD-10-CM | POA: Diagnosis present

## 2018-12-13 DIAGNOSIS — S2239XA Fracture of one rib, unspecified side, initial encounter for closed fracture: Secondary | ICD-10-CM | POA: Diagnosis present

## 2018-12-13 DIAGNOSIS — S22019A Unspecified fracture of first thoracic vertebra, initial encounter for closed fracture: Secondary | ICD-10-CM | POA: Diagnosis present

## 2018-12-13 DIAGNOSIS — Z841 Family history of disorders of kidney and ureter: Secondary | ICD-10-CM | POA: Diagnosis not present

## 2018-12-13 DIAGNOSIS — K5900 Constipation, unspecified: Secondary | ICD-10-CM | POA: Diagnosis present

## 2018-12-13 DIAGNOSIS — S2220XA Unspecified fracture of sternum, initial encounter for closed fracture: Secondary | ICD-10-CM | POA: Diagnosis present

## 2018-12-13 DIAGNOSIS — E039 Hypothyroidism, unspecified: Secondary | ICD-10-CM | POA: Diagnosis present

## 2018-12-13 DIAGNOSIS — W2211XA Striking against or struck by driver side automobile airbag, initial encounter: Secondary | ICD-10-CM | POA: Diagnosis not present

## 2018-12-13 DIAGNOSIS — G8929 Other chronic pain: Secondary | ICD-10-CM | POA: Diagnosis present

## 2018-12-13 DIAGNOSIS — Z79890 Hormone replacement therapy (postmenopausal): Secondary | ICD-10-CM | POA: Diagnosis not present

## 2018-12-13 DIAGNOSIS — D62 Acute posthemorrhagic anemia: Secondary | ICD-10-CM | POA: Diagnosis present

## 2018-12-13 DIAGNOSIS — Z8349 Family history of other endocrine, nutritional and metabolic diseases: Secondary | ICD-10-CM | POA: Diagnosis not present

## 2018-12-13 DIAGNOSIS — M8580 Other specified disorders of bone density and structure, unspecified site: Secondary | ICD-10-CM | POA: Diagnosis present

## 2018-12-13 DIAGNOSIS — F329 Major depressive disorder, single episode, unspecified: Secondary | ICD-10-CM | POA: Diagnosis present

## 2018-12-13 DIAGNOSIS — S2001XA Contusion of right breast, initial encounter: Secondary | ICD-10-CM | POA: Diagnosis present

## 2018-12-13 DIAGNOSIS — Y92410 Unspecified street and highway as the place of occurrence of the external cause: Secondary | ICD-10-CM | POA: Diagnosis not present

## 2018-12-13 DIAGNOSIS — R52 Pain, unspecified: Secondary | ICD-10-CM | POA: Diagnosis present

## 2018-12-13 DIAGNOSIS — Z8249 Family history of ischemic heart disease and other diseases of the circulatory system: Secondary | ICD-10-CM | POA: Diagnosis not present

## 2018-12-13 DIAGNOSIS — S82841A Displaced bimalleolar fracture of right lower leg, initial encounter for closed fracture: Secondary | ICD-10-CM | POA: Diagnosis present

## 2018-12-13 DIAGNOSIS — S322XXA Fracture of coccyx, initial encounter for closed fracture: Secondary | ICD-10-CM | POA: Diagnosis present

## 2018-12-13 LAB — SAMPLE TO BLOOD BANK

## 2018-12-13 LAB — CBC
HCT: 34.9 % — ABNORMAL LOW (ref 36.0–46.0)
Hemoglobin: 10.8 g/dL — ABNORMAL LOW (ref 12.0–15.0)
MCH: 30 pg (ref 26.0–34.0)
MCHC: 30.9 g/dL (ref 30.0–36.0)
MCV: 96.9 fL (ref 80.0–100.0)
Platelets: 244 10*3/uL (ref 150–400)
RBC: 3.6 MIL/uL — ABNORMAL LOW (ref 3.87–5.11)
RDW: 12.5 % (ref 11.5–15.5)
WBC: 19 10*3/uL — ABNORMAL HIGH (ref 4.0–10.5)
nRBC: 0 % (ref 0.0–0.2)

## 2018-12-13 LAB — BASIC METABOLIC PANEL
Anion gap: 12 (ref 5–15)
BUN: 16 mg/dL (ref 8–23)
CO2: 25 mmol/L (ref 22–32)
Calcium: 8.5 mg/dL — ABNORMAL LOW (ref 8.9–10.3)
Chloride: 98 mmol/L (ref 98–111)
Creatinine, Ser: 0.82 mg/dL (ref 0.44–1.00)
GFR calc Af Amer: >60 mL/min (ref >60)
GFR calc non Af Amer: >60 mL/min (ref >60)
Glucose, Bld: 140 mg/dL — ABNORMAL HIGH (ref 70–99)
Potassium: 4.1 mmol/L (ref 3.5–5.1)
Sodium: 135 mmol/L (ref 135–145)

## 2018-12-13 MED ORDER — DULOXETINE HCL 60 MG PO CPEP
60.0000 mg | ORAL_CAPSULE | Freq: Every day | ORAL | Status: DC
  Administered 2018-12-13 – 2018-12-20 (×8): 60 mg via ORAL
  Filled 2018-12-13 (×8): qty 1

## 2018-12-13 MED ORDER — ACETAMINOPHEN 325 MG PO TABS: 650.0000 mg | ORAL_TABLET | ORAL | Status: DC | PRN

## 2018-12-13 MED ORDER — POVIDONE-IODINE 10 % EX SWAB: 2.0000 "application " | Freq: Once | CUTANEOUS | Status: DC

## 2018-12-13 MED ORDER — METHADONE HCL 5 MG PO TABS
7.5000 mg | ORAL_TABLET | Freq: Every day | ORAL | Status: DC
  Administered 2018-12-13 – 2018-12-19 (×7): 7.5 mg via ORAL
  Filled 2018-12-13 (×7): qty 2

## 2018-12-13 MED ORDER — BISACODYL 10 MG RE SUPP
10.0000 mg | Freq: Every day | RECTAL | Status: DC | PRN
  Administered 2018-12-19: 10 mg via RECTAL
  Filled 2018-12-13: qty 1

## 2018-12-13 MED ORDER — SODIUM CHLORIDE 0.9 % IV SOLN
INTRAVENOUS | Status: DC
  Administered 2018-12-13 – 2018-12-16 (×3): via INTRAVENOUS

## 2018-12-13 MED ORDER — PRAMIPEXOLE DIHYDROCHLORIDE 0.125 MG PO TABS: 0.1250 mg | ORAL_TABLET | Freq: Every day | ORAL | Status: DC

## 2018-12-13 MED ORDER — LEVOTHYROXINE SODIUM 50 MCG PO TABS
50.0000 ug | ORAL_TABLET | Freq: Every day | ORAL | Status: DC
  Administered 2018-12-13 – 2018-12-20 (×8): 50 ug via ORAL
  Filled 2018-12-13 (×8): qty 1

## 2018-12-13 MED ORDER — ACETAMINOPHEN 325 MG PO TABS
650.0000 mg | ORAL_TABLET | Freq: Four times a day (QID) | ORAL | Status: DC
  Administered 2018-12-13 – 2018-12-20 (×26): 650 mg via ORAL
  Filled 2018-12-13 (×27): qty 2

## 2018-12-13 MED ORDER — FENTANYL CITRATE (PF) 100 MCG/2ML IJ SOLN
25.0000 ug | INTRAMUSCULAR | Status: DC | PRN
  Administered 2018-12-13 (×2): 25 ug via INTRAVENOUS
  Filled 2018-12-13 (×2): qty 2

## 2018-12-13 MED ORDER — OXYCODONE HCL 5 MG PO TABS
5.0000 mg | ORAL_TABLET | ORAL | Status: DC | PRN
  Administered 2018-12-13 – 2018-12-16 (×10): 5 mg via ORAL
  Filled 2018-12-13 (×10): qty 1

## 2018-12-13 MED ORDER — DOCUSATE SODIUM 100 MG PO CAPS
100.0000 mg | ORAL_CAPSULE | Freq: Every day | ORAL | Status: DC
  Administered 2018-12-13 – 2018-12-15 (×3): 100 mg via ORAL
  Filled 2018-12-13 (×3): qty 1

## 2018-12-13 MED ORDER — ENOXAPARIN SODIUM 40 MG/0.4ML ~~LOC~~ SOLN
40.0000 mg | SUBCUTANEOUS | Status: DC
  Administered 2018-12-14 – 2018-12-20 (×7): 40 mg via SUBCUTANEOUS
  Filled 2018-12-13 (×7): qty 0.4

## 2018-12-13 MED ORDER — METHOCARBAMOL 500 MG PO TABS
500.0000 mg | ORAL_TABLET | Freq: Three times a day (TID) | ORAL | Status: DC | PRN
  Administered 2018-12-13 – 2018-12-20 (×14): 500 mg via ORAL
  Filled 2018-12-13 (×15): qty 1

## 2018-12-13 MED ORDER — SENNOSIDES-DOCUSATE SODIUM 8.6-50 MG PO TABS
1.0000 | ORAL_TABLET | Freq: Two times a day (BID) | ORAL | Status: DC | PRN
  Administered 2018-12-17: 1 via ORAL
  Filled 2018-12-13 (×2): qty 1

## 2018-12-13 MED ORDER — ADULT MULTIVITAMIN W/MINERALS CH
1.0000 | ORAL_TABLET | Freq: Two times a day (BID) | ORAL | Status: DC
  Administered 2018-12-13 – 2018-12-20 (×15): 1 via ORAL
  Filled 2018-12-13 (×15): qty 1

## 2018-12-13 MED ORDER — LORAZEPAM 0.5 MG PO TABS
0.2500 mg | ORAL_TABLET | Freq: Every evening | ORAL | Status: DC | PRN
  Administered 2018-12-13 (×2): 0.5 mg via ORAL
  Administered 2018-12-17 (×2): 0.25 mg via ORAL
  Administered 2018-12-18 – 2018-12-20 (×2): 0.5 mg via ORAL
  Filled 2018-12-13 (×6): qty 1

## 2018-12-13 MED ORDER — ONDANSETRON HCL 4 MG/2ML IJ SOLN
4.0000 mg | Freq: Four times a day (QID) | INTRAMUSCULAR | Status: DC | PRN
  Administered 2018-12-13 – 2018-12-15 (×2): 4 mg via INTRAVENOUS
  Filled 2018-12-13 (×2): qty 2

## 2018-12-13 MED ORDER — LORATADINE 10 MG PO TABS
10.0000 mg | ORAL_TABLET | Freq: Every day | ORAL | Status: DC
  Administered 2018-12-13 – 2018-12-19 (×7): 10 mg via ORAL
  Filled 2018-12-13 (×7): qty 1

## 2018-12-13 MED ORDER — ONDANSETRON 4 MG PO TBDP
4.0000 mg | ORAL_TABLET | Freq: Four times a day (QID) | ORAL | Status: DC | PRN
  Administered 2018-12-13: 4 mg via ORAL
  Filled 2018-12-13: qty 1

## 2018-12-13 MED ORDER — MORPHINE SULFATE (PF) 2 MG/ML IV SOLN
2.0000 mg | INTRAVENOUS | Status: DC | PRN
  Administered 2018-12-13: 2 mg via INTRAVENOUS
  Filled 2018-12-13 (×2): qty 1

## 2018-12-13 MED ORDER — CEFAZOLIN SODIUM-DEXTROSE 2-4 GM/100ML-% IV SOLN
2.0000 g | INTRAVENOUS | Status: DC
  Filled 2018-12-13: qty 100

## 2018-12-13 MED ORDER — CHLORHEXIDINE GLUCONATE 4 % EX LIQD
60.0000 mL | Freq: Once | CUTANEOUS | Status: AC
  Administered 2018-12-13: 4 via TOPICAL

## 2018-12-13 MED ORDER — BETHANECHOL CHLORIDE 25 MG PO TABS
25.0000 mg | ORAL_TABLET | Freq: Three times a day (TID) | ORAL | Status: DC
  Administered 2018-12-13 – 2018-12-18 (×17): 25 mg via ORAL
  Filled 2018-12-13 (×18): qty 1

## 2018-12-13 NOTE — Plan of Care (Signed)
  Problem: Education: Goal: Knowledge of General Education information will improve Description Including pain rating scale, medication(s)/side effects and non-pharmacologic comfort measures Outcome: Progressing   Problem: Clinical Measurements: Goal: Ability to maintain clinical measurements within normal limits will improve Outcome: Progressing Goal: Will remain free from infection Outcome: Progressing Goal: Respiratory complications will improve Outcome: Progressing Goal: Cardiovascular complication will be avoided Outcome: Progressing   Problem: Pain Managment: Goal: General experience of comfort will improve Outcome: Progressing   Problem: Skin Integrity: Goal: Risk for impaired skin integrity will decrease Outcome: Progressing

## 2018-12-13 NOTE — ED Notes (Signed)
Report to [facility].  

## 2018-12-13 NOTE — ED Notes (Signed)
[place]ista collar applied to pt. , EDP explained tests results and plan of care to pt.
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
## 2018-12-13 NOTE — ED Notes (Signed)
Patient currently at MRI

## 2018-12-13 NOTE — Progress Notes (Signed)
PT Cancellation Note  Patient Details Name: Grace Schmitt MRN: [id number] DOB: 1946-09-10   Cancelled Treatment:    Reason Eval/Treat Not Completed: Active bedrest order   Duncan Dull 12/13/2018, 7:52 AM Alben Deeds, PT DPT  Board Certified Neurologic Specialist Acute Rehabilitation Services Pager [phone] Office [phone]
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
## 2018-12-13 NOTE — ED Notes (Signed)
New ice pack placed on right ankle area
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
## 2018-12-13 NOTE — ED Notes (Signed)
Patient transported to MRI 

## 2018-12-13 NOTE — ED Triage Notes (Signed)
Lunch ordered and Pt updated . Pt given a soda as requested and crackers as requested.

## 2018-12-13 NOTE — Progress Notes (Signed)
Patient admit to NP room 11, alert oriented X four, family at bedside. Patient c/o generalized pain 6/10 but was given pain medicine right before she arrived to the floor. Patient is in bed locked at low position. Call light, telephone and bedside table within patient reach.

## 2018-12-13 NOTE — ED Notes (Addendum)
Purwick placed on pt by this tech. Tech Phillinda present as witness
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
## 2018-12-14 ENCOUNTER — Ambulatory Visit: Payer: PPO | Admitting: Neurology

## 2018-12-14 LAB — BASIC METABOLIC PANEL
Anion gap: 8 (ref 5–15)
BUN: 9 mg/dL (ref 8–23)
CO2: 25 mmol/L (ref 22–32)
Calcium: 7.9 mg/dL — ABNORMAL LOW (ref 8.9–10.3)
Chloride: 105 mmol/L (ref 98–111)
Creatinine, Ser: 0.68 mg/dL (ref 0.44–1.00)
GFR calc Af Amer: >60 mL/min (ref >60)
GFR calc non Af Amer: >60 mL/min (ref >60)
Glucose, Bld: 117 mg/dL — ABNORMAL HIGH (ref 70–99)
Potassium: 3.6 mmol/L (ref 3.5–5.1)
Sodium: 138 mmol/L (ref 135–145)

## 2018-12-14 LAB — CBC
HCT: 29.4 % — ABNORMAL LOW (ref 36.0–46.0)
Hemoglobin: 9.3 g/dL — ABNORMAL LOW (ref 12.0–15.0)
MCH: 30.6 pg (ref 26.0–34.0)
MCHC: 31.6 g/dL (ref 30.0–36.0)
MCV: 96.7 fL (ref 80.0–100.0)
Platelets: 185 10*3/uL (ref 150–400)
RBC: 3.04 MIL/uL — ABNORMAL LOW (ref 3.87–5.11)
RDW: 12.9 % (ref 11.5–15.5)
WBC: 10.8 10*3/uL — ABNORMAL HIGH (ref 4.0–10.5)
nRBC: 0 % (ref 0.0–0.2)

## 2018-12-14 MED ORDER — POLYETHYLENE GLYCOL 3350 17 G PO PACK: 17.0000 g | PACK | Freq: Every day | ORAL | Status: DC | PRN

## 2018-12-14 NOTE — Progress Notes (Signed)
PT Cancellation Note  Patient Details Name: Grace Schmitt MRN: [id number] DOB: [birth year]   Cancelled Treatment:    Reason Eval/Treat Not Completed: Active bedrest order.  Pt remains on bedrest.  She is scheduled for the OR on Thursday to fix her ankle.  Will attempt to find physicians to see if bedrest order is an over site or if they would like therapy to wait until after ORIF.  Thanks,  Barbarann Ehlers. Breckin Savannah, PT, DPT  Acute Rehabilitation [phone] pager [phone]) [phone] office     Wells Guiles B Daire Okimoto 12/14/2018, 9:28 AM
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
## 2018-12-15 ENCOUNTER — Encounter: Payer: PPO | Admitting: Internal Medicine

## 2018-12-15 ENCOUNTER — Encounter (HOSPITAL_COMMUNITY): Payer: Self-pay | Admitting: Certified Registered Nurse Anesthetist

## 2018-12-15 ENCOUNTER — Inpatient Hospital Stay (HOSPITAL_COMMUNITY): Payer: No Typology Code available for payment source | Admitting: Anesthesiology

## 2018-12-15 ENCOUNTER — Encounter (HOSPITAL_COMMUNITY): Admission: EM | Disposition: A | Discharge status: Home / Self Care | Payer: Self-pay | Source: Home / Self Care

## 2018-12-15 HISTORY — PX: ORIF ANKLE FRACTURE: SHX5408

## 2018-12-15 LAB — BASIC METABOLIC PANEL
Anion gap: 8 (ref 5–15)
BUN: 7 mg/dL — ABNORMAL LOW (ref 8–23)
CO2: 28 mmol/L (ref 22–32)
Calcium: 7.9 mg/dL — ABNORMAL LOW (ref 8.9–10.3)
Chloride: 105 mmol/L (ref 98–111)
Creatinine, Ser: 0.74 mg/dL (ref 0.44–1.00)
GFR calc Af Amer: >60 mL/min (ref >60)
GFR calc non Af Amer: >60 mL/min (ref >60)
Glucose, Bld: 102 mg/dL — ABNORMAL HIGH (ref 70–99)
Potassium: 3.7 mmol/L (ref 3.5–5.1)
Sodium: 141 mmol/L (ref 135–145)

## 2018-12-15 LAB — CBC
HCT: 27.1 % — ABNORMAL LOW (ref 36.0–46.0)
Hemoglobin: 8.8 g/dL — ABNORMAL LOW (ref 12.0–15.0)
MCH: 31.9 pg (ref 26.0–34.0)
MCHC: 32.5 g/dL (ref 30.0–36.0)
MCV: 98.2 fL (ref 80.0–100.0)
Platelets: 200 10*3/uL (ref 150–400)
RBC: 2.76 MIL/uL — ABNORMAL LOW (ref 3.87–5.11)
RDW: 12.7 % (ref 11.5–15.5)
WBC: 8.8 10*3/uL (ref 4.0–10.5)
nRBC: 0 % (ref 0.0–0.2)

## 2018-12-15 LAB — SURGICAL PCR SCREEN
MRSA, PCR: NEGATIVE
Staphylococcus aureus: NEGATIVE

## 2018-12-15 SURGERY — OPEN REDUCTION INTERNAL FIXATION (ORIF) ANKLE FRACTURE
Anesthesia: General | Site: Foot | Laterality: Right

## 2018-12-15 MED ORDER — CEFAZOLIN SODIUM 1 G IJ SOLR
INTRAMUSCULAR | Status: AC
  Filled 2018-12-15: qty 20

## 2018-12-15 MED ORDER — PROPOFOL 10 MG/ML IV BOLUS
INTRAVENOUS | Status: AC
  Filled 2018-12-15: qty 20

## 2018-12-15 MED ORDER — PROMETHAZINE HCL 25 MG/ML IJ SOLN: 6.2500 mg | INTRAMUSCULAR | Status: DC | PRN

## 2018-12-15 MED ORDER — ONDANSETRON HCL 4 MG/2ML IJ SOLN
4.0000 mg | Freq: Four times a day (QID) | INTRAMUSCULAR | Status: DC | PRN
  Administered 2018-12-18 – 2018-12-19 (×2): 4 mg via INTRAVENOUS
  Filled 2018-12-15 (×3): qty 2

## 2018-12-15 MED ORDER — DEXAMETHASONE SODIUM PHOSPHATE 10 MG/ML IJ SOLN
INTRAMUSCULAR | Status: AC
  Filled 2018-12-15: qty 1

## 2018-12-15 MED ORDER — METOCLOPRAMIDE HCL 5 MG PO TABS
5.0000 mg | ORAL_TABLET | Freq: Three times a day (TID) | ORAL | Status: DC | PRN
  Filled 2018-12-15: qty 2

## 2018-12-15 MED ORDER — CEFAZOLIN SODIUM-DEXTROSE 2-3 GM-%(50ML) IV SOLR
INTRAVENOUS | Status: DC | PRN
  Administered 2018-12-15: 2 g via INTRAVENOUS

## 2018-12-15 MED ORDER — DOCUSATE SODIUM 100 MG PO CAPS
100.0000 mg | ORAL_CAPSULE | Freq: Two times a day (BID) | ORAL | Status: DC
  Administered 2018-12-15 – 2018-12-20 (×10): 100 mg via ORAL
  Filled 2018-12-15 (×10): qty 1

## 2018-12-15 MED ORDER — LIDOCAINE-EPINEPHRINE (PF) 1.5 %-1:200000 IJ SOLN
INTRAMUSCULAR | Status: DC | PRN
  Administered 2018-12-15: 10 mL via PERINEURAL

## 2018-12-15 MED ORDER — HYDROMORPHONE HCL 1 MG/ML IJ SOLN
INTRAMUSCULAR | Status: AC
  Administered 2018-12-15: 17:00:00
  Filled 2018-12-15: qty 1

## 2018-12-15 MED ORDER — FENTANYL CITRATE (PF) 100 MCG/2ML IJ SOLN
INTRAMUSCULAR | Status: AC
  Administered 2018-12-15: 50 ug via INTRAVENOUS
  Filled 2018-12-15: qty 2

## 2018-12-15 MED ORDER — CLONIDINE HCL (ANALGESIA) 100 MCG/ML EP SOLN
EPIDURAL | Status: DC | PRN
  Administered 2018-12-15: 50 ug

## 2018-12-15 MED ORDER — ONDANSETRON HCL 4 MG/2ML IJ SOLN
INTRAMUSCULAR | Status: DC | PRN
  Administered 2018-12-15: 4 mg via INTRAVENOUS

## 2018-12-15 MED ORDER — METOCLOPRAMIDE HCL 5 MG/ML IJ SOLN: 5.0000 mg | Freq: Three times a day (TID) | INTRAMUSCULAR | Status: DC | PRN

## 2018-12-15 MED ORDER — CEFAZOLIN SODIUM-DEXTROSE 2-4 GM/100ML-% IV SOLN
2.0000 g | Freq: Four times a day (QID) | INTRAVENOUS | Status: AC
  Administered 2018-12-15 – 2018-12-16 (×3): 2 g via INTRAVENOUS
  Filled 2018-12-15 (×3): qty 100

## 2018-12-15 MED ORDER — DEXAMETHASONE SODIUM PHOSPHATE 10 MG/ML IJ SOLN
INTRAMUSCULAR | Status: DC | PRN
  Administered 2018-12-15: 10 mg via INTRAVENOUS

## 2018-12-15 MED ORDER — FENTANYL CITRATE (PF) 250 MCG/5ML IJ SOLN
INTRAMUSCULAR | Status: AC
  Filled 2018-12-15: qty 5

## 2018-12-15 MED ORDER — ROPIVACAINE HCL 5 MG/ML IJ SOLN
INTRAMUSCULAR | Status: DC | PRN
  Administered 2018-12-15: 25 mL via PERINEURAL

## 2018-12-15 MED ORDER — HYDROMORPHONE HCL 1 MG/ML IJ SOLN
0.2500 mg | INTRAMUSCULAR | Status: DC | PRN
  Administered 2018-12-15: 0.5 mg via INTRAVENOUS

## 2018-12-15 MED ORDER — MIDAZOLAM HCL 2 MG/2ML IJ SOLN
2.0000 mg | Freq: Once | INTRAMUSCULAR | Status: AC
  Administered 2018-12-15: 2 mg via INTRAVENOUS
  Filled 2018-12-15: qty 2

## 2018-12-15 MED ORDER — FENTANYL CITRATE (PF) 100 MCG/2ML IJ SOLN
INTRAMUSCULAR | Status: DC | PRN
  Administered 2018-12-15 (×2): 50 ug via INTRAVENOUS

## 2018-12-15 MED ORDER — FENTANYL CITRATE (PF) 100 MCG/2ML IJ SOLN
50.0000 ug | Freq: Once | INTRAMUSCULAR | Status: AC
  Administered 2018-12-15: 50 ug via INTRAVENOUS
  Filled 2018-12-15: qty 1

## 2018-12-15 MED ORDER — MIDAZOLAM HCL 2 MG/2ML IJ SOLN
INTRAMUSCULAR | Status: AC
  Administered 2018-12-15: 2 mg via INTRAVENOUS
  Filled 2018-12-15: qty 2

## 2018-12-15 MED ORDER — ONDANSETRON HCL 4 MG PO TABS: 4.0000 mg | ORAL_TABLET | Freq: Four times a day (QID) | ORAL | Status: DC | PRN

## 2018-12-15 MED ORDER — 0.9 % SODIUM CHLORIDE (POUR BTL) OPTIME
TOPICAL | Status: DC | PRN
  Administered 2018-12-15: 1000 mL

## 2018-12-15 MED ORDER — PHENYLEPHRINE HCL 10 MG/ML IJ SOLN
INTRAMUSCULAR | Status: DC | PRN
  Administered 2018-12-15 (×3): 80 ug via INTRAVENOUS

## 2018-12-15 MED ORDER — PHENYLEPHRINE 40 MCG/ML (10ML) SYRINGE FOR IV PUSH (FOR BLOOD PRESSURE SUPPORT)
PREFILLED_SYRINGE | INTRAVENOUS | Status: AC
  Filled 2018-12-15: qty 10

## 2018-12-15 MED ORDER — LACTATED RINGERS IV SOLN
INTRAVENOUS | Status: DC
  Administered 2018-12-15: 15:00:00 via INTRAVENOUS

## 2018-12-15 MED ORDER — LIDOCAINE 2% (20 MG/ML) 5 ML SYRINGE
INTRAMUSCULAR | Status: AC
  Filled 2018-12-15: qty 5

## 2018-12-15 SURGICAL SUPPLY — 62 items
1.4mm smooth wire MFG# 705233 ×3 IMPLANT
BANDAGE ACE 4X5 VEL STRL LF (GAUZE/BANDAGES/DRESSINGS) ×3 IMPLANT
BANDAGE ACE 6X5 VEL STRL LF (GAUZE/BANDAGES/DRESSINGS) ×1 IMPLANT
BANDAGE ESMARK 6X9 LF (GAUZE/BANDAGES/DRESSINGS) ×1 IMPLANT
BIT DRILL CANN 2.7 (BIT) ×2
BIT DRILL SRG 2.7XCANN AO CPLG (BIT) IMPLANT
BIT DRL SRG 2.7XCANN AO CPLNG (BIT) ×1
BNDG CMPR 9X6 STRL LF SNTH (GAUZE/BANDAGES/DRESSINGS) ×1
BNDG ESMARK 6X9 LF (GAUZE/BANDAGES/DRESSINGS) ×2
CANISTER SUCT 3000ML PPV (MISCELLANEOUS) ×2 IMPLANT
COVER SURGICAL LIGHT HANDLE (MISCELLANEOUS) ×2 IMPLANT
CUFF TOURNIQUET SINGLE 34IN LL (TOURNIQUET CUFF) IMPLANT
DRAPE OEC MINIVIEW 54X84 (DRAPES) ×2 IMPLANT
DRAPE ORTHO SPLIT 77X108 STRL (DRAPES) ×2
DRAPE SURG ORHT 6 SPLT 77X108 (DRAPES) IMPLANT
DRAPE U-SHAPE 47X51 STRL (DRAPES) ×1 IMPLANT
DRSG EMULSION OIL 3X3 NADH (GAUZE/BANDAGES/DRESSINGS) ×1 IMPLANT
DRSG PAD ABDOMINAL 8X10 ST (GAUZE/BANDAGES/DRESSINGS) ×4 IMPLANT
DURAPREP 26ML APPLICATOR (WOUND CARE) ×2 IMPLANT
ELECT REM PT RETURN 9FT ADLT (ELECTROSURGICAL) ×2
ELECTRODE REM PT RTRN 9FT ADLT (ELECTROSURGICAL) ×1 IMPLANT
GAUZE SPONGE 4X4 12PLY STRL (GAUZE/BANDAGES/DRESSINGS) ×2 IMPLANT
GLOVE BIO SURGEON STRL SZ7.5 (GLOVE) ×4 IMPLANT
GLOVE BIO SURGEON STRL SZ8 (GLOVE) ×2 IMPLANT
GLOVE BIOGEL PI IND STRL 8 (GLOVE) ×2 IMPLANT
GLOVE BIOGEL PI INDICATOR 8 (GLOVE) ×2
GOWN STRL REUS W/ TWL LRG LVL3 (GOWN DISPOSABLE) ×2 IMPLANT
GOWN STRL REUS W/ TWL XL LVL3 (GOWN DISPOSABLE) ×1 IMPLANT
GOWN STRL REUS W/TWL LRG LVL3 (GOWN DISPOSABLE) ×4
GOWN STRL REUS W/TWL XL LVL3 (GOWN DISPOSABLE) ×2
GUIDE WIRE UNTHRD 1.4X150 (Wire) ×6 IMPLANT
GUIDEWIRE UNTHRD 1.4X150 (Wire) IMPLANT
KIT BASIN OR (CUSTOM PROCEDURE TRAY) ×2 IMPLANT
KIT TURNOVER KIT B (KITS) ×2 IMPLANT
NEEDLE 22X1 1/2 (OR ONLY) (NEEDLE) ×2 IMPLANT
NS IRRIG 1000ML POUR BTL (IV SOLUTION) ×2 IMPLANT
PACK ORTHO EXTREMITY (CUSTOM PROCEDURE TRAY) ×2 IMPLANT
PAD ABD 8X10 STRL (GAUZE/BANDAGES/DRESSINGS) ×1 IMPLANT
PAD ARMBOARD 7.5X6 YLW CONV (MISCELLANEOUS) ×4 IMPLANT
PAD CAST 4YDX4 CTTN HI CHSV (CAST SUPPLIES) ×2 IMPLANT
PADDING CAST COTTON 4X4 STRL (CAST SUPPLIES) ×2
PADDING CAST COTTON 6X4 STRL (CAST SUPPLIES) ×1 IMPLANT
SCREW CANN 44X15X4XSLF DRL (Screw) IMPLANT
SCREW CANN ASNIS 4.0X55 (Screw) ×1 IMPLANT
SCREW CANNULATED 4.0X44MM (Screw) ×4 IMPLANT
SPONGE LAP 18X18 X RAY DECT (DISPOSABLE) ×1 IMPLANT
STRIP CLOSURE SKIN 1/2X4 (GAUZE/BANDAGES/DRESSINGS) ×2 IMPLANT
SUCTION FRAZIER HANDLE 10FR (MISCELLANEOUS) ×1
SUCTION TUBE FRAZIER 10FR DISP (MISCELLANEOUS) ×1 IMPLANT
SUT ETHILON 3 0 PS 1 (SUTURE) ×1 IMPLANT
SUT MNCRL AB 4-0 PS2 18 (SUTURE) ×2 IMPLANT
SUT MON AB 2-0 CT1 27 (SUTURE) ×1 IMPLANT
SUT VIC AB 0 CT1 27 (SUTURE)
SUT VIC AB 0 CT1 27XBRD ANBCTR (SUTURE) IMPLANT
SYR BULB IRRIGATION 50ML (SYRINGE) ×2 IMPLANT
SYR CONTROL 10ML LL (SYRINGE) IMPLANT
TOWEL OR 17X24 6PK STRL BLUE (TOWEL DISPOSABLE) ×2 IMPLANT
TOWEL OR 17X26 10 PK STRL BLUE (TOWEL DISPOSABLE) ×2 IMPLANT
TUBE CONNECTING 12X1/4 (SUCTIONS) ×2 IMPLANT
UNDERPAD 30X30 (UNDERPADS AND DIAPERS) ×4 IMPLANT
WATER STERILE IRR 1000ML POUR (IV SOLUTION) ×2 IMPLANT
YANKAUER SUCT BULB TIP NO VENT (SUCTIONS) ×1 IMPLANT

## 2018-12-15 NOTE — Anesthesia Procedure Notes (Signed)
Anesthesia Procedure Image    

## 2018-12-15 NOTE — Anesthesia Preprocedure Evaluation (Addendum)
Anesthesia Evaluation  Patient identified by MRN, date of birth, ID band Patient awake    Reviewed: Allergy & Precautions, NPO status , Patient's Chart, lab work & pertinent test results  History of Anesthesia Complications (+) PONV and history of anesthetic complications  Airway Mallampati: II  TM Distance: >3 FB Neck ROM: Full    Dental no notable dental hx. (+) Teeth Intact   Pulmonary sleep apnea ,  sarcoidosis   Pulmonary exam normal breath sounds clear to auscultation       Cardiovascular negative cardio ROS Normal cardiovascular exam Rhythm:Regular Rate:Normal     Neuro/Psych    GI/Hepatic negative GI ROS,   Endo/Other  Hypothyroidism   Renal/GU negative Renal ROS  negative genitourinary   Musculoskeletal   Abdominal Normal abdominal exam  (+)   Peds negative pediatric ROS (+)  Hematology negative hematology ROS (+)   Anesthesia Other Findings   Reproductive/Obstetrics negative OB ROS                            Anesthesia Physical Anesthesia Plan  ASA: II  Anesthesia Plan: General   Post-op Pain Management:  Regional for Post-op pain   Induction: Intravenous  PONV Risk Score and Plan: 3 and Ondansetron, Dexamethasone and Treatment may vary due to age or medical condition  Airway Management Planned: LMA  Additional Equipment:   Intra-op Plan:   Post-operative Plan: Extubation in OR  Informed Consent: I have reviewed the patients History and Physical, chart, labs and discussed the procedure including the risks, benefits and alternatives for the proposed anesthesia with the patient or authorized representative who has indicated his/her understanding and acceptance.   Dental advisory given  Plan Discussed with: CRNA and Surgeon  Anesthesia Plan Comments:         Anesthesia Quick Evaluation
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
## 2018-12-15 NOTE — Anesthesia Procedure Notes (Signed)
Procedure Name: LMA Insertion Date/Time: 12/15/2018 2:52 PM Performed by: Heaton Sarin T, CRNA Pre-anesthesia Checklist: Patient identified, Emergency Drugs available, Suction available and Patient being monitored Patient Re-evaluated:Patient Re-evaluated prior to induction Oxygen Delivery Method: Circle system utilized Preoxygenation: Pre-oxygenation with 100% oxygen Induction Type: IV induction LMA: LMA inserted LMA Size: 4.0 Number of attempts: 1 Airway Equipment and Method: Patient positioned with wedge pillow Placement Confirmation: positive ETCO2 and breath sounds checked- equal and bilateral Tube secured with: Tape Dental Injury: Teeth and Oropharynx as per pre-operative assessment

## 2018-12-15 NOTE — Anesthesia Postprocedure Evaluation (Signed)
Anesthesia Post Note  Patient: Counselling psychologist  Procedure(s) Performed: OPEN REDUCTION INTERNAL FIXATION (ORIF) ANKLE FRACTURE (Right Foot)     Patient location during evaluation: PACU Anesthesia Type: General Level of consciousness: awake and alert Pain management: pain level controlled Vital Signs Assessment: post-procedure vital signs reviewed and stable Respiratory status: spontaneous breathing, nonlabored ventilation, respiratory function stable and patient connected to nasal cannula oxygen Cardiovascular status: blood pressure returned to baseline and stable Postop Assessment: no apparent nausea or vomiting Anesthetic complications: no    Last Vitals:  Vitals:   12/15/18 1630 12/15/18 1645  BP: 126/69 126/60  Pulse: 91 94  Resp: 14 16  Temp:    SpO2: 98% 97%    Last Pain:  Vitals:   12/15/18 1647  TempSrc:   PainSc: 4                  Christobal Morado S

## 2018-12-15 NOTE — Progress Notes (Addendum)
Subjective: Patient reports "I'm doing pretty good. They've got my pain under control"  Objective: Vital signs in last 24 hours: Temp:  [97.9 F (36.6 C)-98.7 F (37.1 C)] 98.1 F (36.7 C) (12/19 0759) Pulse Rate:  [71-96] 73 (12/19 0759) Resp:  [10-20] 14 (12/19 0759) BP: (94-107)/(47-56) 101/50 (12/19 0759) SpO2:  [96 %-100 %] 100 % (12/19 0759)  Intake/Output from previous day: 12/18 0701 - 12/19 0700 In: 3836.4 [P.O.:240; I.V.:3596.4] Out: 1425 [Urine:1425] Intake/Output this shift: Total I/O In: -  Out: 1100 [Urine:1100]  Alert, conversant. MAEW (right foot/ankle boot). Vista collar in use. For ORIF right ankle today. Pt aware of plan for outpatient f/u by neurosurgery.   Lab Results: Recent Labs    12/14/18 1007 12/15/18 0507  WBC 10.8* 8.8  HGB 9.3* 8.8*  HCT 29.4* 27.1*  PLT 185 200   BMET Recent Labs    12/14/18 1007 12/15/18 0507  NA 138 141  K 3.6 3.7  CL 105 105  CO2 25 28  GLUCOSE 117* 102*  BUN 9 7*  CREATININE 0.68 0.74  CALCIUM 7.9* 7.9*    Studies/Results: No results found.  Assessment/Plan:   LOS: 2 days  Supportive care continues. Awaits ORIF right ankle today. Glidescope for intubation.   Verdis Prime 12/15/2018, 10:10 AM  Patient tolerating cervical collar well.

## 2018-12-15 NOTE — Interval H&P Note (Signed)
I participated in the care of this patient and agree with the above history, physical and evaluation. I performed a review of the history and a physical exam as detailed   Raileigh Sabater Daniel Iktan Aikman MD  

## 2018-12-15 NOTE — Transfer of Care (Signed)
Immediate Anesthesia Transfer of Care Note  Patient: Grace Schmitt  Procedure(s) Performed: OPEN REDUCTION INTERNAL FIXATION (ORIF) ANKLE FRACTURE (Right Foot)  Patient Location: PACU  Anesthesia Type:GA combined with regional for post-op pain  Level of Consciousness: drowsy  Airway & Oxygen Therapy: Patient Spontanous Breathing and Patient connected to nasal cannula oxygen  Post-op Assessment: Report given to RN, Post -op Vital signs reviewed and stable and Patient moving all extremities  Post vital signs: Reviewed and stable  Last Vitals:  Vitals Value Taken Time  BP 137/69 12/15/2018  4:02 PM  Temp 37.1 C 12/15/2018  4:02 PM  Pulse 88 12/15/2018  4:08 PM  Resp 10 12/15/2018  4:08 PM  SpO2 98 % 12/15/2018  4:08 PM  Vitals shown include unvalidated device data.  Last Pain:  Vitals:   12/15/18 1602  TempSrc:   PainSc: 7       Patients Stated Pain Goal: 3 (07/37/10 6269)  Complications: No apparent anesthesia complications
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
## 2018-12-15 NOTE — Anesthesia Procedure Notes (Signed)
Anesthesia Regional Block: Popliteal block   Pre-Anesthetic Checklist: ,, timeout performed, Correct Patient, Correct Site, Correct Laterality, Correct Procedure, Correct Position, site marked, Risks and benefits discussed,  Surgical consent,  Pre-op evaluation,  At surgeon's request and post-op pain management  Laterality: Right  Prep: chloraprep       Needles:  Injection technique: Single-shot  Needle Type: Echogenic Needle     Needle Length: 9cm      Additional Needles:   Procedures:,,,, ultrasound used (permanent image in chart),,,,  Narrative:  Start time: 12/15/2018 3:25 PM End time: 12/15/2018 3:34 PM Injection made incrementally with aspirations every 5 mL.  Performed by: Personally  Anesthesiologist: Myrtie Soman, MD  Additional Notes: Patient tolerated the procedure well without complications

## 2018-12-15 NOTE — Anesthesia Procedure Notes (Signed)
Anesthesia Regional Block: Femoral nerve block (saphenous)   Pre-Anesthetic Checklist: ,, timeout performed, Correct Patient, Correct Site, Correct Laterality, Correct Procedure, Correct Position, site marked, Risks and benefits discussed,  Surgical consent,  Pre-op evaluation,  At surgeon's request and post-op pain management  Laterality: Right  Prep: chloraprep       Needles:  Injection technique: Single-shot  Needle Type: Echogenic Needle     Needle Length: 9cm      Additional Needles:   Procedures:,,,, ultrasound used (permanent image in chart),,,,  Narrative:  Start time: 12/15/2018 2:35 PM End time: 12/15/2018 2:38 PM Injection made incrementally with aspirations every 5 mL.  Performed by: Personally  Anesthesiologist: Myrtie Soman, MD  Additional Notes: Patient tolerated the procedure well without complications
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
## 2018-12-15 NOTE — Op Note (Signed)
12/15/2018  4:05 PM  PATIENT:  Grace Schmitt    PRE-OPERATIVE DIAGNOSIS:  Right ankle fx  POST-OPERATIVE DIAGNOSIS:  Same  PROCEDURE:  OPEN REDUCTION INTERNAL FIXATION (ORIF) ANKLE FRACTURE  SURGEON:  Renette Butters, MD  ASSISTANT: Roxan Hockey, PA-C, he was present and scrubbed throughout the case, critical for completion in a timely fashion, and for retraction, instrumentation, and closure.   ANESTHESIA:   gen   PREOPERATIVE INDICATIONS:  Grace Schmitt is a  72 y.o. female with a diagnosis of Right ankle fx who failed conservative measures and elected for surgical management.    The risks benefits and alternatives were discussed with the patient preoperatively including but not limited to the risks of infection, bleeding, nerve injury, cardiopulmonary complications, the need for revision surgery, among others, and the patient was willing to proceed.  OPERATIVE IMPLANTS: stryker cannulated screws  OPERATIVE FINDINGS: Unstable ankle fracture. Stable syndesmosis post op  BLOOD LOSS: min  COMPLICATIONS: none  TOURNIQUET TIME: 70min  OPERATIVE PROCEDURE:  Patient was identified in the preoperative holding area and site was marked by me He was transported to the operating theater and placed on the table in supine position taking care to pad all bony prominences. After a preincinduction time out anesthesia was induced. The right lower extremity was prepped and draped in normal sterile fashion and a pre-incision timeout was performed. Grace Schmitt received ancef for preoperative antibiotics.   I placed a lateral percuatneous screw up the canal of the fibula to stabilize lateral ankle.    I then turned my attention medially where I created a 4 cm incision and dissected sharply down to the medial Mal fracture taking care to protect the saphenous vein. I debrided the fracture and reduced and held in place with a tenaculum. I then drilled and placed 2 partially  threaded 45 mm cannulated screws one anterior and one posterior across the fracture.   I then stressed the syndesmosis and it was stable  The wound was then thoroughly irrigated and closed using a 0 Vicryl and absorbable Monocryl sutures. He was placed in a short leg splint.   POST OPERATIVE PLAN: Non-weightbearing. DVT prophylaxis will consist of mobilization and chemical px

## 2018-12-15 NOTE — Progress Notes (Signed)
PT Cancellation Note  Patient Details Name: Grace Schmitt MRN: [id number] DOB: [birth year]   Cancelled Treatment:    Reason Eval/Treat Not Completed: Patient at procedure or test/unavailable I was not able to make it around to see pt before she went to the OR today.  PT will check back in with her tomorrow.   Thanks,  Barbarann Ehlers. Sinthia Karabin, PT, DPT  Acute Rehabilitation [phone] pager [phone]) [phone] office    Barbarann Ehlers [name] 12/15/2018, 4:25 PM

## 2018-12-16 LAB — CBC
HCT: 25.1 % — ABNORMAL LOW (ref 36.0–46.0)
Hemoglobin: 8 g/dL — ABNORMAL LOW (ref 12.0–15.0)
MCH: 31.1 pg (ref 26.0–34.0)
MCHC: 31.9 g/dL (ref 30.0–36.0)
MCV: 97.7 fL (ref 80.0–100.0)
Platelets: 203 10*3/uL (ref 150–400)
RBC: 2.57 MIL/uL — ABNORMAL LOW (ref 3.87–5.11)
RDW: 12.3 % (ref 11.5–15.5)
WBC: 8.1 10*3/uL (ref 4.0–10.5)
nRBC: 0 % (ref 0.0–0.2)

## 2018-12-16 LAB — BASIC METABOLIC PANEL
Anion gap: 8 (ref 5–15)
BUN: 5 mg/dL — ABNORMAL LOW (ref 8–23)
CO2: 27 mmol/L (ref 22–32)
Calcium: 7.5 mg/dL — ABNORMAL LOW (ref 8.9–10.3)
Chloride: 105 mmol/L (ref 98–111)
Creatinine, Ser: 0.66 mg/dL (ref 0.44–1.00)
GFR calc Af Amer: >60 mL/min (ref >60)
GFR calc non Af Amer: >60 mL/min (ref >60)
Glucose, Bld: 167 mg/dL — ABNORMAL HIGH (ref 70–99)
Potassium: 3.8 mmol/L (ref 3.5–5.1)
Sodium: 140 mmol/L (ref 135–145)

## 2018-12-16 LAB — MAGNESIUM: Magnesium: 2 mg/dL (ref 1.7–2.4)

## 2018-12-16 MED ORDER — OXYCODONE HCL 5 MG PO TABS
5.0000 mg | ORAL_TABLET | ORAL | Status: DC | PRN
  Administered 2018-12-16 – 2018-12-20 (×17): 5 mg via ORAL
  Filled 2018-12-16 (×17): qty 1

## 2018-12-16 MED ORDER — SODIUM CHLORIDE 0.9 % IV BOLUS
500.0000 mL | Freq: Once | INTRAVENOUS | Status: AC
  Administered 2018-12-16: 500 mL via INTRAVENOUS

## 2018-12-16 MED ORDER — POLYETHYLENE GLYCOL 3350 17 G PO PACK
17.0000 g | PACK | Freq: Every day | ORAL | Status: DC
  Administered 2018-12-16 – 2018-12-20 (×5): 17 g via ORAL
  Filled 2018-12-16 (×5): qty 1

## 2018-12-16 MED ORDER — TAB-A-VITE/IRON PO TABS
1.0000 | ORAL_TABLET | Freq: Every day | ORAL | Status: DC
  Administered 2018-12-17 – 2018-12-20 (×3): 1 via ORAL
  Filled 2018-12-16 (×5): qty 1

## 2018-12-16 MED ORDER — FENTANYL CITRATE (PF) 100 MCG/2ML IJ SOLN
25.0000 ug | INTRAMUSCULAR | Status: DC | PRN
  Administered 2018-12-17 – 2018-12-18 (×3): 25 ug via INTRAVENOUS
  Filled 2018-12-16 (×3): qty 2

## 2018-12-16 NOTE — Progress Notes (Signed)
Oxygen applied at 2 lpm due to 89-90% on RA. Able to titrate down to 1lpm to keep sats above 92% Call from  [facility] central tele for multifocal PVCs, patient denies any cardiac symptoms.   Tonye Royalty, RN, BSN
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
## 2018-12-16 NOTE — Care Management Note (Signed)
Case Management Note  Patient Details  Name: Grace Schmitt MRN: [id number] Date of Birth: Jul 07, 1946  Subjective/Objective:                    Action/Plan:  Discussed discharge planning with patient at bedside. Patient had requested [facility], referral given to [facility] with [facility], however at this time due to staffing [facility] cannot accept referral. Same explained to patient and visitor . Provided patient with Medicare.gov list of home health agencies. Patient requesting to discuss decision with husband. When she decides she will tell bedside who will call NCM.  Continue to follow. Expected Discharge Date:  12/17/18               Expected Discharge Plan:  [place]  In-House Referral:     Discharge planning Services  CM Consult  Post Acute Care Choice:  Home Health Choice offered to:  Patient  DME Arranged:    DME Agency:     HH Arranged:  PT, OT HH Agency:     Status of Service:  In process, will continue to follow  If discussed at Long Length of Stay Meetings, dates discussed:    Additional Comments:  Marilu Favre, RN 12/16/2018, 3:52 PM

## 2018-12-16 NOTE — Care Management Important Message (Signed)
Important Message  Patient Details  Name: Grace Schmitt MRN: [id number] Date of Birth: 1946-05-21   Medicare Important Message Given:  Yes    Orbie Pyo 12/16/2018, 2:26 PM
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
## 2018-12-16 NOTE — Progress Notes (Signed)
Subjective: Patient reports "I'm ok"  Objective: Vital signs in last 24 hours: Temp:  [97.9 F (36.6 C)-98.7 F (37.1 C)] 98.3 F (36.8 C) (12/20 0749) Pulse Rate:  [55-94] 61 (12/20 0749) Resp:  [10-27] 16 (12/20 0749) BP: (82-137)/(43-69) 96/46 (12/20 0749) SpO2:  [96 %-100 %] 100 % (12/20 0749)  Intake/Output from previous day: 12/19 0701 - 12/20 0700 In: 2089.6 [P.O.:120; I.V.:1419.6; IV Piggyback:550] Out: 2625 [Urine:2600; Blood:25] Intake/Output this shift: No intake/output data recorded.  Alert, conversant, MAEW (right ankle immobilized). Minimal discomfort. Hopeful to mobilize soon. Good strength BUE. No neck pain this am. Vista collar in use. (Chin sinks below chin cup when head propped on pillow - not concerning. Lateral stability in collar remains.)   Lab Results: Recent Labs    12/15/18 0507 12/16/18 0431  WBC 8.8 8.1  HGB 8.8* 8.0*  HCT 27.1* 25.1*  PLT 200 203   BMET Recent Labs    12/15/18 0507 12/16/18 0431  NA 141 140  K 3.7 3.8  CL 105 105  CO2 28 27  GLUCOSE 102* 167*  BUN 7* 5*  CREATININE 0.74 0.66  CALCIUM 7.9* 7.5*    Studies/Results: No results found.  Assessment/Plan:   LOS: 3 days  NS will follow up with office visit in 3 weeks. Ok to reposition Vista collar as needed.Ok to remove for bath/shower. Reassured that she can reposition collar - will fit well when sitting upright and head not tipped forward by pillow. Mobility per Ortho.   Verdis Prime 12/16/2018, 7:50 AM

## 2018-12-17 LAB — BASIC METABOLIC PANEL
Anion gap: 9 (ref 5–15)
BUN: 7 mg/dL — ABNORMAL LOW (ref 8–23)
CO2: 28 mmol/L (ref 22–32)
Calcium: 8 mg/dL — ABNORMAL LOW (ref 8.9–10.3)
Chloride: 104 mmol/L (ref 98–111)
Creatinine, Ser: 0.71 mg/dL (ref 0.44–1.00)
GFR calc Af Amer: >60 mL/min (ref >60)
GFR calc non Af Amer: >60 mL/min (ref >60)
Glucose, Bld: 88 mg/dL (ref 70–99)
Potassium: 3.6 mmol/L (ref 3.5–5.1)
Sodium: 141 mmol/L (ref 135–145)

## 2018-12-17 LAB — CBC
HCT: 25.2 % — ABNORMAL LOW (ref 36.0–46.0)
Hemoglobin: 7.8 g/dL — ABNORMAL LOW (ref 12.0–15.0)
MCH: 30.4 pg (ref 26.0–34.0)
MCHC: 31 g/dL (ref 30.0–36.0)
MCV: 98.1 fL (ref 80.0–100.0)
Platelets: 256 10*3/uL (ref 150–400)
RBC: 2.57 MIL/uL — ABNORMAL LOW (ref 3.87–5.11)
RDW: 12.7 % (ref 11.5–15.5)
WBC: 9.7 10*3/uL (ref 4.0–10.5)
nRBC: 0 % (ref 0.0–0.2)

## 2018-12-17 MED ORDER — BISACODYL 10 MG RE SUPP
10.0000 mg | Freq: Once | RECTAL | Status: AC
  Administered 2018-12-17: 10 mg via RECTAL
  Filled 2018-12-17 (×2): qty 1

## 2018-12-17 MED ORDER — FERROUS GLUCONATE 324 (38 FE) MG PO TABS
324.0000 mg | ORAL_TABLET | Freq: Three times a day (TID) | ORAL | Status: DC
  Administered 2018-12-17 – 2018-12-20 (×8): 324 mg via ORAL
  Filled 2018-12-17 (×11): qty 1
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
## 2018-12-17 NOTE — Progress Notes (Signed)
Subjective: Patient reports doing OK. Sore in ankle.  Objective: Vital signs in last 24 hours: Temp:  [97.8 F (36.6 C)-98.4 F (36.9 C)] 97.8 F (36.6 C) (12/21 0757) Pulse Rate:  [66-145] 76 (12/21 1000) Resp:  [11-20] 20 (12/21 1000) BP: (88-122)/(44-80) 106/80 (12/21 1000) SpO2:  [92 %-100 %] 99 % (12/21 1000)  Intake/Output from previous day: 12/20 0701 - 12/21 0700 In: -  Out: 1750 [Urine:1750] Intake/Output this shift: Total I/O In: 120 [P.O.:120] Out: 900 [Urine:900]  Physical Exam: Tolerating collar well; fitting well.  Minimal neck pain.  Lab Results: Recent Labs    12/16/18 0431 12/17/18 0310  WBC 8.1 9.7  HGB 8.0* 7.8*  HCT 25.1* 25.2*  PLT 203 256   BMET Recent Labs    12/16/18 0431 12/17/18 0310  NA 140 141  K 3.8 3.6  CL 105 104  CO2 27 28  GLUCOSE 167* 88  BUN 5* 7*  CREATININE 0.66 0.71  CALCIUM 7.5* 8.0*    Studies/Results: No results found.  Assessment/Plan: Patient is doing well from my standpoint.  F/U in office one month post-injury with X rays.    LOS: 4 days    Peggyann Shoals, MD 12/17/2018, 10:48 AM

## 2018-12-17 NOTE — Progress Notes (Signed)
Trauma Service Note  Subjective: Patient having some multi-focal PVCs last not ans a slight dip in her oxygen saturation which has resolved this AM.  She is wiped out in chair this AM.  Objective: Vital signs in last 24 hours: Temp:  [97.8 F (36.6 C)-98.4 F (36.9 C)] 97.8 F (36.6 C) (12/21 0757) Pulse Rate:  [66-145] 76 (12/21 1000) Resp:  [11-20] 20 (12/21 1000) BP: (88-122)/(44-80) 106/80 (12/21 1000) SpO2:  [92 %-100 %] 99 % (12/21 1000) Last BM Date: 12/12/18  Intake/Output from previous day: 12/20 0701 - 12/21 0700 In: -  Out: 1750 [Urine:1750] Intake/Output this shift: Total I/O In: 120 [P.O.:120] Out: 900 [Urine:900]  General: No acute distress, no chest pain, but can feel irregularity in HR  Lungs: Clear  Abd: Benign  Extremities: Intact  Neuro: Intadct  Lab Results: CBC  Recent Labs    12/16/18 0431 12/17/18 0310  WBC 8.1 9.7  HGB 8.0* 7.8*  HCT 25.1* 25.2*  PLT 203 256   BMET Recent Labs    12/16/18 0431 12/17/18 0310  NA 140 141  K 3.8 3.6  CL 105 104  CO2 27 28  GLUCOSE 167* 88  BUN 5* 7*  CREATININE 0.66 0.71  CALCIUM 7.5* 8.0*   PT/INR No results for input(s): LABPROT, INR in the last 72 hours. ABG No results for input(s): PHART, HCO3 in the last 72 hours.  Invalid input(s): PCO2, PO2  Studies/Results: No results found.  Anti-infectives: Anti-infectives (From admission, onward)   Start     Dose/Rate Route Frequency Ordered Stop   12/15/18 2100  ceFAZolin (ANCEF) IVPB 2g/100 mL premix     2 g 200 mL/hr over 30 Minutes Intravenous Every 6 hours 12/15/18 1713 12/16/18 0945   12/14/18 0600  ceFAZolin (ANCEF) IVPB 2g/100 mL premix  Status:  Discontinued     2 g 200 mL/hr over 30 Minutes Intravenous To [facility] 12/13/18 1543 12/15/18 0600      Assessment/Plan: s/p Procedure(s): OPEN REDUCTION INTERNAL FIXATION (ORIF) ANKLE FRACTURE Patient no tquite ready to go home yet.  Still having too much discomfort and  want to watch cardiac irregularity more and possible call cards if it continues  She was supposed to get outpatient w/u of recent ":spells" that she has been having when she would get lightheaded dna become incontinent of urine.  LOS: 4 days   Kathryne Eriksson. Dahlia Bailiff, MD, FACS [phone] Trauma Surgeon 12/17/2018

## 2018-12-18 LAB — CBC
HCT: 28 % — ABNORMAL LOW (ref 36.0–46.0)
Hemoglobin: 8.7 g/dL — ABNORMAL LOW (ref 12.0–15.0)
MCH: 30.2 pg (ref 26.0–34.0)
MCHC: 31.1 g/dL (ref 30.0–36.0)
MCV: 97.2 fL (ref 80.0–100.0)
Platelets: 280 10*3/uL (ref 150–400)
RBC: 2.88 MIL/uL — ABNORMAL LOW (ref 3.87–5.11)
RDW: 12.8 % (ref 11.5–15.5)
WBC: 11.9 10*3/uL — ABNORMAL HIGH (ref 4.0–10.5)
nRBC: 0 % (ref 0.0–0.2)

## 2018-12-18 LAB — GLUCOSE, CAPILLARY: Glucose-Capillary: 108 mg/dL — ABNORMAL HIGH (ref 70–99)

## 2018-12-18 MED ORDER — SODIUM CHLORIDE 0.9 % IV BOLUS
500.0000 mL | Freq: Once | INTRAVENOUS | Status: AC
  Administered 2018-12-18: 500 mL via INTRAVENOUS

## 2018-12-18 NOTE — Progress Notes (Signed)
Patient ID: Grace Schmitt, female   DOB: [birth year], 72 y.o.   MRN: [id number] Patient appears comfortable with hard collar on Denies any numbness or tingling Follow-up with Dr. Venetia Constable in about 2 weeks as an outpatient

## 2018-12-18 NOTE — Progress Notes (Signed)
Trauma Service Note  Subjective: Events of last night noted.  No distress this AM  Objective: Vital signs in last 24 hours: Temp:  [98 F (36.7 C)-98.4 F (36.9 C)] 98.1 F (36.7 C) (12/22 0814) Pulse Rate:  [45-99] 72 (12/22 0645) Resp:  [11-22] 12 (12/22 0645) BP: (84-133)/(50-90) 120/90 (12/22 0814) SpO2:  [85 %-100 %] 97 % (12/22 0645) Last BM Date: 12/12/18  Intake/Output from previous day: 12/21 0701 - 12/22 0700 In: 860 [P.O.:360; IV Piggyback:500] Out: 1600 [Urine:1600] Intake/Output this shift: Total I/O In: 240 [P.O.:240] Out: -   General: No acute distress currently.  Lungs: Clear.  Oxygenation good  Abd: Soft and benign  Extremities: Right leg in splint.  Having expected amount of pain.  Neuro: Intact  Lab Results: CBC  Recent Labs    12/17/18 0310 12/18/18 0528  WBC 9.7 11.9*  HGB 7.8* 8.7*  HCT 25.2* 28.0*  PLT 256 280   BMET Recent Labs    12/16/18 0431 12/17/18 0310  NA 140 141  K 3.8 3.6  CL 105 104  CO2 27 28  GLUCOSE 167* 88  BUN 5* 7*  CREATININE 0.66 0.71  CALCIUM 7.5* 8.0*   PT/INR No results for input(s): LABPROT, INR in the last 72 hours. ABG No results for input(s): PHART, HCO3 in the last 72 hours.  Invalid input(s): PCO2, PO2  Studies/Results: No results found.  Anti-infectives: Anti-infectives (From admission, onward)   Start     Dose/Rate Route Frequency Ordered Stop   12/15/18 2100  ceFAZolin (ANCEF) IVPB 2g/100 mL premix     2 g 200 mL/hr over 30 Minutes Intravenous Every 6 hours 12/15/18 1713 12/16/18 0945   12/14/18 0600  ceFAZolin (ANCEF) IVPB 2g/100 mL premix  Status:  Discontinued     2 g 200 mL/hr over 30 Minutes Intravenous To [facility] 12/13/18 1543 12/15/18 0600      Assessment/Plan: s/p Procedure(s): OPEN REDUCTION INTERNAL FIXATION (ORIF) ANKLE FRACTURE Plan for discharge tomorrow  Event from last night likely vagal when up to toilet to have BM. Stable now. I think another 24  hours of observation in order because she keep having some event that make [place] question her stability for home.   Likely will be able to go home tomorrow.  LOS: 5 days   Grace Schmitt. Dahlia Bailiff, MD, FACS [phone] Trauma Surgeon 12/18/2018
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
## 2018-12-18 NOTE — Progress Notes (Signed)
Patient asked if an MD can come look at her right ankle. We have been unable to get her sharp shooting pain under control today. She have a pulse, her right foot is warm and her cap refill is normal. She has been getting 5mg  oxycodone Q4, and Robaxin Q8. And scheduled tylenol. I had to give her a dose of 25 fentanyl IV and nothing seems to be helping. We also put ice on it, and it is elevated. I paged Trauma MD on call, awaiting call back.

## 2018-12-18 NOTE — Progress Notes (Addendum)
Patient c/o nausea when standing up and transferred to bedside commode. Denies having nausea prior to this, b/p taken 84/66 sitting. HR dropped 50, came back up to 70s. Stood patient up 84/62, blood sugar 108. Alert and oriented x 4.   MD on call trauma paged.   0246-Awaiting call back.   0302- New order to give 500cc bolus. Ordered CBC for this morning.   0306- bolus started. Patient lying in bed. No c/o shortness of breath. Says that nausea is better. Alert and oriented x 4. B/p running q 5 mins. Heart rate 60s, 02 sat 94. Will continue to observe. C/o pain to right ankle. Oxy is ordered prn. Will complete bolus before treating pain at this time. Other measures are given for comfort. Right leg elevated. Perwick placed. Offered to reposition but she assures that she is comfortable.

## 2018-12-19 ENCOUNTER — Telehealth: Payer: Self-pay | Admitting: Primary Care

## 2018-12-19 ENCOUNTER — Encounter (HOSPITAL_COMMUNITY): Payer: Self-pay | Admitting: Orthopedic Surgery

## 2018-12-19 MED ORDER — SORBITOL 70 % SOLN
960.0000 mL | TOPICAL_OIL | Freq: Once | ORAL | Status: AC
  Administered 2018-12-19: 960 mL via RECTAL
  Filled 2018-12-19: qty 473

## 2018-12-19 MED ORDER — MAGNESIUM CITRATE PO SOLN: 0.5000 | Freq: Once | ORAL | Status: DC | PRN

## 2018-12-19 MED ORDER — MAGNESIUM CITRATE PO SOLN
0.5000 | Freq: Once | ORAL | Status: AC
  Administered 2018-12-19: 0.5 via ORAL
  Filled 2018-12-19: qty 296

## 2018-12-19 NOTE — Progress Notes (Addendum)
Pt reports uncontrolled pelvic pain, described as rectal and vaginal pressure similar to the discomfort felt before chirldbirth. Has not had a BM in 7 days. Will order a SMOG enema.Will re-assess readiness for discharge tomorrow morning.   Obie Dredge, PA-C [facility] Pager: [phone]
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
## 2018-12-19 NOTE — Progress Notes (Signed)
Subjective: Patient reports "My neck is the least of my pains"  Objective: Vital signs in last 24 hours: Temp:  [98.3 F (36.8 C)-98.7 F (37.1 C)] 98.4 F (36.9 C) (12/23 0749) Pulse Rate:  [71-88] 81 (12/23 0800) Resp:  [10-16] 10 (12/23 0800) BP: (99-124)/(50-70) 109/68 (12/23 0800) SpO2:  [96 %-100 %] 98 % (12/23 0800)  Intake/Output from previous day: 12/22 0701 - 12/23 0700 In: 720 [P.O.:720] Out: -  Intake/Output this shift: Total I/O In: 240 [P.O.:240] Out: -   Alert, conversant. Family present. Pt reports sharp stabbing right foot pains intermittently over the last 1-2 days. Hypotensive episode over weekend, but doing well last 24hrs. Vista collar in use, fits well.   Lab Results: Recent Labs    12/17/18 0310 12/18/18 0528  WBC 9.7 11.9*  HGB 7.8* 8.7*  HCT 25.2* 28.0*  PLT 256 280   BMET Recent Labs    12/17/18 0310  NA 141  K 3.6  CL 104  CO2 28  GLUCOSE 88  BUN 7*  CREATININE 0.71  CALCIUM 8.0*    Studies/Results: No results found.  Assessment/Plan:   LOS: 6 days  Office f/u with NS in 3 weeks with x-ray. Continue collar 24/7 except showers/baths.    Verdis Prime 12/19/2018, 9:00 AM

## 2018-12-20 ENCOUNTER — Ambulatory Visit: Payer: PPO | Admitting: Cardiology

## 2018-12-20 LAB — CREATININE, SERUM
Creatinine, Ser: 0.73 mg/dL (ref 0.44–1.00)
GFR calc Af Amer: >60 mL/min (ref >60)
GFR calc non Af Amer: >60 mL/min (ref >60)

## 2018-12-20 MED ORDER — METHOCARBAMOL 500 MG PO TABS: 500.0000 mg | ORAL_TABLET | Freq: Three times a day (TID) | ORAL | 0 refills | Status: DC | PRN

## 2018-12-20 MED ORDER — POLYETHYLENE GLYCOL 3350 17 G PO PACK: PACK | ORAL | 0 refills | Status: DC

## 2018-12-20 MED ORDER — OXYCODONE HCL 5 MG PO TABS: 5.0000 mg | ORAL_TABLET | ORAL | 0 refills | Status: DC | PRN

## 2018-12-20 MED ORDER — DOCUSATE SODIUM 100 MG PO CAPS: ORAL_CAPSULE | ORAL | 0 refills | Status: DC

## 2018-12-20 MED ORDER — ACETAMINOPHEN 325 MG PO TABS: ORAL_TABLET | ORAL | Status: DC

## 2018-12-20 NOTE — Discharge Instructions (Signed)
Ankle Fracture No weight bearing until cleared by Dr. Percell Schmitt  The ankle joint is made up of the lower (distal) sections of your lower leg bones(tibia and fibula) along with a bone in your foot (talus). An ankle fracture is a break in one, two, or all three of these sections of bone. Follow these instructions at home: If you have a splint:  Wear the splint as told by your doctor. Take it off only as told by your doctor.  Loosen the splint if your toes tingle, become numb, or turn cold and blue.  Keep the splint clean.  If the splint is not waterproof: ? Do not let it get wet. ? Cover it with a watertight covering when you take a bath or a shower. If you have a cast:  Do not stick anything inside the cast to scratch your skin. Doing that increases your risk of infection.  Check the skin around the cast every day. Tell your doctor about any concerns.  You may put lotion on dry skin around the edges of the cast. Do not put lotion on the skin underneath the cast.  Keep the cast clean.  If the cast is not waterproof: ? Do not let it get wet. ? Cover it with a watertight covering when you take a bath or a shower. Managing pain, stiffness, and swelling  If directed, put ice on the injured area: ? If you have a removable splint, remove it as told by your doctor. ? Put ice in a plastic bag. ? Place a towel between your skin and the bag. ? Leave the ice on for 20 minutes, 2-3 times a day.  Move your toes often. This prevents stiffness and lessens swelling.  Raise (elevate) the injured area above the level of your heart while you are sitting or lying down. General instructions  Do not use the injured limb to support your body weight until your doctor says that you can. Use crutches as told by your doctor.  Take over-the-counter and prescription medicines only as told by your doctor.  Ask your doctor when it is safe to drive if you have a cast or splint.  Do exercises as told by  your doctor.  Do not use any products that contain nicotine or tobacco, such as cigarettes and e-cigarettes. These can delay bone healing. If you need help quitting, ask your doctor.  Keep all follow-up visits as told by your doctor. This is important. Contact a doctor if:  Your pain or swelling gets worse.  Your pain or swelling does not get better when you rest or take medicine. Get help right away if:  Your cast gets damaged.  You continue to have very bad pain.  You have new pain or swelling.  Your skin or toes below the injured ankle: ? Turn blue or gray. ? Feel cold or numb. ? Lose sensitivity to touch. Summary  An ankle fracture is a break in one, two, or all three of the bones in your lower leg and lower foot.  If you have a splint, wear it as told by your health care provider. Keep it clean and dry.  If you have a cast, do not stick anything inside the cast to scratch your skin. This can cause infection.  Use ice, take medicines, raise your foot, and avoid tobacco and nicotine products. These steps will lessen pain and swelling and speed up healing. This information is not intended to replace advice given to you by  your health care provider. Make sure you discuss any questions you have with your health care provider. Document Released: 10/11/2009 Document Revised: 01/18/2017 Document Reviewed: 01/18/2017 Elsevier Interactive Patient Education  2019 [employer]    Sternal Fracture  A sternal fracture is a break in the bone in the center of your chest (sternum or breastbone). This fracture is not dangerous unless there is also an injury to your heart or lungs, which are protected by the sternum and ribs. What are the causes? This condition is usually caused by a forceful injury from:  Motor vehicle collisions. This is the most common cause.  Contact sports.  Physical assaults. You can also have a sternal fracture without having a forceful injury if the bone  becomes weakened over time (stress fracture or insufficiency fracture). What increases the risk? You may be at greater risk for a sternal fracture if you:  Participate in direct contact sports, such as football or wrestling.  Work at elevated heights, such as in Architect. Other risk factors for a stress or insufficiency sternal fracture include:  Being female.  Being a postmenopausal woman.  Being age 59 or older.  Having osteoporosis.  Having severe curvature of the spine.  Being on long-term steroid treatment. What are the signs or symptoms? Symptoms of this condition include:  Pain over the sternum.  Pain when pressing on the sternum.  Pain that gets worse with deep breathing or coughing.  Shortness of breath.  Bruising.  Swelling.  A crackling sound when taking a deep breath or pressing on the sternum. How is this diagnosed? This condition is diagnosed with a medical history and physical exam. You may also have imaging tests, including:  CT scan.  Ultrasound.  Chest X-rays that are taken from a side view. Your health care provider may check your blood oxygen level with a pulse oximetry test. You may also have repeated electrocardiograms (ECGs) to make sure that your heart has not been injured. You may also have a blood test to check for damage to your heart muscle. How is this treated? Treatment depends on the severity of your injury. A sternal fracture without any other injury (isolated sternal fracture) usually heals without treatment. You may need to limit (restrict) some activities at home and take medicine for pain relief. In rare cases, you may need surgery to repair a sternal fracture that continues to cause severe pain or a sternal fracture that involves bones that have been moved out of position considerably (displaced fracture). Follow these instructions at home:  Take over-the-counter and prescription medicines only as told by your health care  provider.  Rest at home. Return to your normal activities as told by your health care provider. Ask your health care provider what activities are safe for you.  If directed, apply ice to the injured area: ? Put ice in a plastic bag. ? Place a towel between your skin and the bag. ? Leave the ice on for 20 minutes, 2-3 times a day.  Do not lift anything that is heavier than 10 lb (4.5 kg) until your health care provider says it is safe.  Do not drive or operate heavy machinery while taking prescription pain medicine.  Do not use any tobacco products, such as cigarettes, chewing tobacco, and e-cigarettes. If you need help quitting, ask your health care provider.  Keep all follow-up visits as told by your health care provider. This is important. Contact a health care provider if:  Your pain medicine is  not helping.  You continue to have pain after several weeks.  You develop a fever.  You develop a cough and you have thick or bloody mucus (sputum). Get help right away if:  You have difficulty breathing.  You have chest pain.  You have an abnormal heartbeat (palpitations).  You feel nauseous or you have pain in your abdomen. This information is not intended to replace advice given to you by your health care provider. Make sure you discuss any questions you have with your health care provider. Document Released: 07/28/2004 Document Revised: 08/11/2016 Document Reviewed: 07/10/2015 Elsevier Interactive Patient Education  2019 [employer]. Irregular bowel habits such as constipation and diarrhea can lead to many problems over time.  Having one soft bowel movement a day is the most important way to prevent further problems.  The anorectal canal is designed to handle stretching and feces to safely manage our ability to get rid of solid waste (feces, poop, stool) out of our body.  BUT, hard constipated stools can act like ripping concrete bricks and  diarrhea can be a burning fire to this very sensitive area of our body, causing inflamed hemorrhoids, anal fissures, increasing risk is perirectal abscesses, abdominal pain/bloating, an making irritable bowel worse.     The goal: ONE SOFT BOWEL MOVEMENT A DAY!  To have soft, regular bowel movements:   Drink at least 8 tall glasses of water a day.    Take plenty of fiber.  Fiber is the undigested part of plant food that passes into the colon, acting s natures broom to encourage bowel motility and movement.  Fiber can absorb and hold large amounts of water. This results in a larger, bulkier stool, which is soft and easier to pass. Work gradually over several weeks up to 6 servings a day of fiber (25g a day even more if needed) in the form of: o Vegetables -- Root (potatoes, carrots, turnips), leafy green (lettuce, salad greens, celery, spinach), or cooked high residue (cabbage, broccoli, etc) o Fruit -- Fresh (unpeeled skin & pulp), Dried (prunes, apricots, cherries, etc ),  or stewed ( applesauce)  o Whole grain breads, pasta, etc (whole wheat)  o Bran cereals   Bulking Agents -- This type of water-retaining fiber generally is easily obtained each day by one of the following:  o Psyllium bran -- The psyllium plant is remarkable because its ground seeds can retain so much water. This product is available as Metamucil, Konsyl, Effersyllium, Per Diem Fiber, or the less expensive generic preparation in drug and health food stores. Although labeled a laxative, it really is not a laxative.  o Methylcellulose -- This is another fiber derived from wood which also retains water. It is available as Citrucel. o Polyethylene Glycol - and artificial fiber commonly called Miralax or Glycolax.  It is helpful for people with gassy or bloated feelings with regular fiber o Flax Seed - a less gassy fiber than psyllium  No reading or other relaxing activity while on the toilet. If bowel movements take longer than 5  minutes, you are too constipated  AVOID CONSTIPATION.  High fiber and water intake usually takes care of this.  Sometimes a laxative is needed to stimulate more frequent bowel movements, but   Laxatives are not a good long-term solution as it can wear the colon out. o Osmotics (Milk of Magnesia, Fleets phosphosoda, Magnesium citrate, MiraLax, GoLytely) are safer than  o Stimulants (Senokot, Castor Oil, Dulcolax, Ex  Lax)    o Do not take laxatives for more than 7days in a row.   IF SEVERELY CONSTIPATED, try a Bowel Retraining Program: o Do not use laxatives.  o Eat a diet high in roughage, such as bran cereals and leafy vegetables.  o Drink six (6) ounces of prune or apricot juice each morning.  o Eat two (2) large servings of stewed fruit each day.  o Take one (1) heaping tablespoon of a psyllium-based bulking agent twice a day. Use sugar-free sweetener when possible to avoid excessive calories.  o Eat a normal breakfast.  o Set aside 15 minutes after breakfast to sit on the toilet, but do not strain to have a bowel movement.  o If you do not have a bowel movement by the third day, use an enema and repeat the above steps.   Controlling diarrhea o Switch to liquids and simpler foods for a few days to avoid stressing your intestines further. o Avoid dairy products (especially milk & ice cream) for a short time.  The intestines often can lose the ability to digest lactose when stressed. o Avoid foods that cause gassiness or bloating.  Typical foods include beans and other legumes, cabbage, broccoli, and dairy foods.  Every person has some sensitivity to other foods, so listen to our body and avoid those foods that trigger problems for you. o Adding fiber (Citrucel, Metamucil, psyllium, Miralax) gradually can help thicken stools by absorbing excess fluid and retrain the intestines to act more normally.  Slowly increase the dose over a few weeks.  Too much fiber too soon can backfire and cause  cramping & bloating. o Probiotics (such as active yogurt, Align, etc) may help repopulate the intestines and colon with normal bacteria and calm down a sensitive digestive tract.  Most studies show it to be of mild help, though, and such products can be costly. o Medicines: - Bismuth subsalicylate (ex. Kayopectate, Pepto Bismol) every 30 minutes for up to 6 doses can help control diarrhea.  Avoid if pregnant. - Loperamide (Immodium) can slow down diarrhea.  Start with two tablets (4mg  total) first and then try one tablet every 6 hours.  Avoid if you are having fevers or severe pain.  If you are not better or start feeling worse, stop all medicines and call your doctor for advice o Call your doctor if you are getting worse or not better.  Sometimes further testing (cultures, endoscopy, X-ray studies, bloodwork, etc) may be needed to help diagnose and treat the cause of the diarrhea.  Tailbone Injury The tailbone is the small bone at the lower end of the backbone (spine). The tailbone can become injured from:  A fall.  Sitting to row or bike for a long time.  Having a baby. This type of injury can be painful. Most tailbone injuries get better on their own in 4-6 weeks. Follow these instructions at home: Activity  Avoid sitting in one place for a long time.  Wear proper pads and gear when riding a bike or rowing.  Increase your activity as the pain allows.  Do exercises as told by your doctor or physical therapist. Managing pain, stiffness, and swelling  To lessen pain: ? Sit on a large, rubber or inflated ring or cushion. ? Lean forward when you sit.  If told, apply ice to the injured area. ? Put ice in a plastic bag. ? Place a towel between your skin and the bag. ? Leave the ice on for  20 minutes, 2-3 times per day. Do this for the first 1-2 days.  If told, put heat on the injured area. Do this as often as told by your doctor. Use the heat source that your doctor recommends, such  as a moist heat pack or a heating pad. ? Place a towel between your skin and the heat source. ? Leave the heat on for 20-30 minutes. ? Remove the heat if your skin turns bright red. This is very important if you are unable to feel pain, heat, or cold. You may have a greater risk of getting burned. General instructions  Take over-the-counter and prescription medicines only as told by your doctor.  To prevent or treat trouble pooping (constipation) or pain when pooping, your doctor may suggest that you: ? Drink enough fluid to keep your pee (urine) pale yellow. ? Eat foods that are high in fiber. These include fresh fruits and vegetables, whole grains, and beans. ? Limit foods that are high in fat and sugar. These include fried and sweet foods. ? Take an over-the-counter or prescription medicine to treat trouble pooping.  Keep all follow-up visits as told by your doctor. This is important. Contact a doctor if:  Your pain gets worse.  Pooping causes you pain.  You cannot poop after 4 days.  You have pain during sex. Summary  A tailbone injury can happen from a fall, from sitting for a long time to row or bike, or after having a baby.  These injuries can be painful. Most tailbone injuries get better on their own in 4-6 weeks.  Sit on a large, rubber or inflated ring or cushion to lessen pain.  Avoid sitting in one place for a long time.  Follow your doctor's suggestions to prevent or treat trouble pooping. This information is not intended to replace advice given to you by your health care provider. Make sure you discuss any questions you have with your health care provider. Document Released: 01/16/2011 Document Revised: 01/11/2018 Document Reviewed: 01/11/2018 Elsevier Interactive Patient Education  2019 [employer].
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
## 2018-12-20 NOTE — Progress Notes (Signed)
Overall stable.  Pain well controlled.  Tolerating collar well.  Plan for discharge later today.

## 2018-12-22 ENCOUNTER — Telehealth: Payer: Self-pay

## 2018-12-22 DIAGNOSIS — S2220XD Unspecified fracture of sternum, subsequent encounter for fracture with routine healing: Secondary | ICD-10-CM | POA: Diagnosis not present

## 2018-12-22 DIAGNOSIS — S322XXD Fracture of coccyx, subsequent encounter for fracture with routine healing: Secondary | ICD-10-CM | POA: Diagnosis not present

## 2018-12-22 DIAGNOSIS — S22019D Unspecified fracture of first thoracic vertebra, subsequent encounter for fracture with routine healing: Secondary | ICD-10-CM | POA: Diagnosis not present

## 2018-12-22 DIAGNOSIS — S12600D Unspecified displaced fracture of seventh cervical vertebra, subsequent encounter for fracture with routine healing: Secondary | ICD-10-CM | POA: Diagnosis not present

## 2018-12-22 DIAGNOSIS — S82841D Displaced bimalleolar fracture of right lower leg, subsequent encounter for closed fracture with routine healing: Secondary | ICD-10-CM | POA: Diagnosis not present
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
## 2018-12-25 DIAGNOSIS — S322XXA Fracture of coccyx, initial encounter for closed fracture: Secondary | ICD-10-CM | POA: Diagnosis not present

## 2018-12-25 DIAGNOSIS — S82421A Displaced transverse fracture of shaft of right fibula, initial encounter for closed fracture: Secondary | ICD-10-CM | POA: Diagnosis not present

## 2018-12-26 ENCOUNTER — Ambulatory Visit: Payer: Self-pay

## 2018-12-26 DIAGNOSIS — S12600D Unspecified displaced fracture of seventh cervical vertebra, subsequent encounter for fracture with routine healing: Secondary | ICD-10-CM | POA: Diagnosis not present

## 2018-12-26 DIAGNOSIS — S2220XD Unspecified fracture of sternum, subsequent encounter for fracture with routine healing: Secondary | ICD-10-CM | POA: Diagnosis not present

## 2018-12-26 DIAGNOSIS — S322XXD Fracture of coccyx, subsequent encounter for fracture with routine healing: Secondary | ICD-10-CM | POA: Diagnosis not present

## 2018-12-26 DIAGNOSIS — S82841D Displaced bimalleolar fracture of right lower leg, subsequent encounter for closed fracture with routine healing: Secondary | ICD-10-CM | POA: Diagnosis not present

## 2018-12-26 DIAGNOSIS — S22019D Unspecified fracture of first thoracic vertebra, subsequent encounter for fracture with routine healing: Secondary | ICD-10-CM | POA: Diagnosis not present
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
## 2018-12-27 NOTE — Telephone Encounter (Signed)
I really do not have recs on other medications she can try. I would advise her to ask he neurologist.

## 2018-12-27 NOTE — Telephone Encounter (Signed)
Spoke pt and advised that Dr. Raoul Pitch doesn't have any new suggestions to try. Advised to contact her neurologist to see if he has any suggestions. Pt stated that she has contacted them and waiting for them to return her call.

## 2018-12-28 DIAGNOSIS — R937 Abnormal findings on diagnostic imaging of other parts of musculoskeletal system: Secondary | ICD-10-CM

## 2018-12-28 HISTORY — DX: Abnormal findings on diagnostic imaging of other parts of musculoskeletal system: R93.7

## 2018-12-29 DIAGNOSIS — G4733 Obstructive sleep apnea (adult) (pediatric): Secondary | ICD-10-CM | POA: Diagnosis not present

## 2018-12-30 ENCOUNTER — Other Ambulatory Visit: Payer: Self-pay

## 2018-12-30 ENCOUNTER — Ambulatory Visit (INDEPENDENT_AMBULATORY_CARE_PROVIDER_SITE_OTHER): Payer: PPO | Admitting: Family Medicine

## 2018-12-30 ENCOUNTER — Encounter: Payer: Self-pay | Admitting: Family Medicine

## 2018-12-30 DIAGNOSIS — S2001XD Contusion of right breast, subsequent encounter: Secondary | ICD-10-CM

## 2018-12-30 DIAGNOSIS — Z9289 Personal history of other medical treatment: Secondary | ICD-10-CM

## 2018-12-30 DIAGNOSIS — S2001XA Contusion of right breast, initial encounter: Secondary | ICD-10-CM

## 2018-12-30 DIAGNOSIS — S2222XD Fracture of body of sternum, subsequent encounter for fracture with routine healing: Secondary | ICD-10-CM

## 2018-12-30 DIAGNOSIS — IMO0001 Reserved for inherently not codable concepts without codable children: Secondary | ICD-10-CM

## 2018-12-30 DIAGNOSIS — S12600D Unspecified displaced fracture of seventh cervical vertebra, subsequent encounter for fracture with routine healing: Secondary | ICD-10-CM | POA: Diagnosis not present

## 2018-12-30 DIAGNOSIS — S82841D Displaced bimalleolar fracture of right lower leg, subsequent encounter for closed fracture with routine healing: Secondary | ICD-10-CM | POA: Diagnosis not present

## 2018-12-30 DIAGNOSIS — S322XXD Fracture of coccyx, subsequent encounter for fracture with routine healing: Secondary | ICD-10-CM | POA: Diagnosis not present

## 2018-12-30 DIAGNOSIS — D649 Anemia, unspecified: Secondary | ICD-10-CM

## 2018-12-30 DIAGNOSIS — S22019D Unspecified fracture of first thoracic vertebra, subsequent encounter for fracture with routine healing: Secondary | ICD-10-CM | POA: Diagnosis not present

## 2018-12-30 DIAGNOSIS — S12690D Other displaced fracture of seventh cervical vertebra, subsequent encounter for fracture with routine healing: Secondary | ICD-10-CM

## 2018-12-30 DIAGNOSIS — Z9189 Other specified personal risk factors, not elsewhere classified: Secondary | ICD-10-CM | POA: Diagnosis not present

## 2018-12-30 DIAGNOSIS — S2220XD Unspecified fracture of sternum, subsequent encounter for fracture with routine healing: Secondary | ICD-10-CM | POA: Diagnosis not present

## 2018-12-30 LAB — CBC
HCT: 35 % — ABNORMAL LOW (ref 36.0–46.0)
Hemoglobin: 11.9 g/dL — ABNORMAL LOW (ref 12.0–15.0)
MCHC: 33.9 g/dL (ref 30.0–36.0)
MCV: 95.9 fl (ref 78.0–100.0)
Platelets: 566 10*3/uL — ABNORMAL HIGH (ref 150.0–400.0)
RBC: 3.65 Mil/uL — ABNORMAL LOW (ref 3.87–5.11)
RDW: 14.4 % (ref 11.5–15.5)
WBC: 9 10*3/uL (ref 4.0–10.5)
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
## 2018-12-30 NOTE — Patient Instructions (Signed)
I am glad you are doing well.  We will call you with lab results.   Watch your breast hematoma for redness or increased pain. Try a heating pad a few times a day (never directly over skin) .Hematoma can take months to resolve.     Hematoma A hematoma is a collection of blood. A hematoma can happen:  Under the skin.  In an organ.  In a body space.  In a joint space.  In other tissues. The blood can thicken (clot) to form a lump that you can see and feel. The lump is often hard and may become sore and tender. The lump can be very small or very big. Most hematomas get better in a few days to weeks. However, some hematomas may be serious and need medical care. What are the causes? This condition is caused by:  An injury.  Blood that leaks under the skin.  Problems from surgeries.  Medical conditions that cause bleeding or bruising. What increases the risk? You are more likely to develop this condition if:  You are an older adult.  You use medicines that thin your blood. What are the signs or symptoms? Symptoms depend on where the hematoma is in your body.  If the hematoma is under the skin, there is: ? A firm lump on the body. ? Pain and tenderness in the area. ? Bruising. The skin above the lump may be blue, dark blue, purple-red, or yellowish.  If the hematoma is deep in the tissues or body spaces, there may be: ? Blood in the stomach. This may cause pain in the belly (abdomen), weakness, passing out (fainting), and shortness of breath. ? Blood in the head. This may cause a headache, weakness, trouble speaking or understanding speech, or passing out. How is this diagnosed? This condition is diagnosed based on:  Your medical history.  A physical exam.  Imaging tests, such as ultrasound or CT scan.  Blood tests. How is this treated? Treatment depends on the cause, size, and location of the hematoma. Treatment may include:  Doing nothing. Many hematomas go away  on their own without treatment.  Surgery or close monitoring. This may be needed for large hematomas or hematomas that affect the body's organs.  Medicines. These may be given if a medical condition caused the hematoma. Follow these instructions at home: Managing pain, stiffness, and swelling   If told, put ice on the area. ? Put ice in a plastic bag. ? Place a towel between your skin and the bag. ? Leave the ice on for 20 minutes, 2-3 times a day for the first two days.  If told, put heat on the affected area after putting ice on the area for two days. Use the heat source that your doctor tells you to use. This could be a moist heat pack or a heating pad. To do this: ? Place a towel between your skin and the heat source. ? Leave the heat on for 20-30 minutes. ? Remove the heat if your skin turns bright red. This is very important if you are unable to feel pain, heat, or cold. You may have a greater risk of getting burned.  Raise (elevate) the affected area above the level of your heart while you are sitting or lying down.  Wrap the affected area with an elastic bandage, if told by your doctor. Do not wrap the bandage too tightly.  If your hematoma is on a leg or foot and is painful,  your doctor may give you crutches. Use them as told by your doctor. General instructions  Take over-the-counter and prescription medicines only as told by your doctor.  Keep all follow-up visits as told by your doctor. This is important. Contact a doctor if:  You have a fever.  The swelling or bruising gets worse.  You start to get more hematomas. Get help right away if:  Your pain gets worse.  Your pain is not getting better with medicine.  Your skin over the hematoma breaks or starts to bleed.  Your hematoma is in your chest or belly and you: ? Pass out. ? Feel weak. ? Become short of breath.  You have a hematoma on your scalp that is caused by a fall or injury, and you: ? Have a  headache that gets worse. ? Have trouble speaking or understanding speech. ? Become less alert or you pass out. Summary  A hematoma is a collection of blood in any part of your body.  Most hematomas get better on their own in a few days to weeks. Some may need medical care.  Follow instructions from your doctor about how to care for your hematoma.  Contact a doctor if the swelling or bruising gets worse, or if you are short of breath. This information is not intended to replace advice given to you by your health care provider. Make sure you discuss any questions you have with your health care provider. Document Released: 01/21/2005 Document Revised: 05/19/2018 Document Reviewed: 05/19/2018 Elsevier Interactive Patient Education  2019 [employer].

## 2019-01-02 ENCOUNTER — Telehealth: Payer: Self-pay | Admitting: Family Medicine

## 2019-01-02 NOTE — Telephone Encounter (Signed)
Copied from [place] [id number]. Topic: General - Other >> Jan 02, 2019 11:12 AM Carolyn Stare wrote:  Pt said she had PT today on her ankle and is now in pain and is asking if DR Raoul Pitch will refill her  oxyCODONE (OXY IR/ROXICODONE) 5 MG immediate release tablet    this was given to her at the hospital    Pharmacy   [facility]   Oxycodone given in hospital was 12/20/18 # 40 with no refills.   Please advise.

## 2019-01-02 NOTE — Telephone Encounter (Signed)
This is a controlled substance initiated for an orthopedic surgical condition. Law requires face to face appt for refill on controlled substances by the ordering physician -> I owuld advise her to discuss with orthopedic surgeon.

## 2019-01-02 NOTE — Telephone Encounter (Signed)
Patient advised, voiced understanding.

## 2019-01-03 ENCOUNTER — Encounter: Payer: Self-pay | Admitting: Family Medicine

## 2019-01-03 DIAGNOSIS — S82841A Displaced bimalleolar fracture of right lower leg, initial encounter for closed fracture: Secondary | ICD-10-CM

## 2019-01-03 DIAGNOSIS — S322XXA Fracture of coccyx, initial encounter for closed fracture: Secondary | ICD-10-CM | POA: Insufficient documentation

## 2019-01-03 DIAGNOSIS — S129XXD Fracture of neck, unspecified, subsequent encounter: Secondary | ICD-10-CM

## 2019-01-03 DIAGNOSIS — S2222XA Fracture of body of sternum, initial encounter for closed fracture: Secondary | ICD-10-CM | POA: Insufficient documentation

## 2019-01-03 HISTORY — DX: Displaced bimalleolar fracture of right lower leg, initial encounter for closed fracture: S82.841A

## 2019-01-03 HISTORY — DX: Fracture of coccyx, initial encounter for closed fracture: S32.2XXA

## 2019-01-03 HISTORY — DX: Fracture of neck, unspecified, subsequent encounter: S12.9XXD

## 2019-01-03 HISTORY — DX: Fracture of body of sternum, initial encounter for closed fracture: S22.22XA

## 2019-01-06 DIAGNOSIS — S82841D Displaced bimalleolar fracture of right lower leg, subsequent encounter for closed fracture with routine healing: Secondary | ICD-10-CM | POA: Diagnosis not present

## 2019-01-07 DIAGNOSIS — G4733 Obstructive sleep apnea (adult) (pediatric): Secondary | ICD-10-CM | POA: Diagnosis not present

## 2019-01-16 DIAGNOSIS — S12690D Other displaced fracture of seventh cervical vertebra, subsequent encounter for fracture with routine healing: Secondary | ICD-10-CM | POA: Diagnosis not present

## 2019-01-16 DIAGNOSIS — Z6830 Body mass index (BMI) 30.0-30.9, adult: Secondary | ICD-10-CM | POA: Diagnosis not present

## 2019-01-16 DIAGNOSIS — M542 Cervicalgia: Secondary | ICD-10-CM | POA: Diagnosis not present

## 2019-01-16 DIAGNOSIS — S22010D Wedge compression fracture of first thoracic vertebra, subsequent encounter for fracture with routine healing: Secondary | ICD-10-CM | POA: Diagnosis not present

## 2019-01-25 DIAGNOSIS — S82421A Displaced transverse fracture of shaft of right fibula, initial encounter for closed fracture: Secondary | ICD-10-CM | POA: Diagnosis not present

## 2019-01-25 DIAGNOSIS — S322XXA Fracture of coccyx, initial encounter for closed fracture: Secondary | ICD-10-CM | POA: Diagnosis not present

## 2019-01-27 DIAGNOSIS — S82841D Displaced bimalleolar fracture of right lower leg, subsequent encounter for closed fracture with routine healing: Secondary | ICD-10-CM | POA: Diagnosis not present

## 2019-01-30 DIAGNOSIS — S12600D Unspecified displaced fracture of seventh cervical vertebra, subsequent encounter for fracture with routine healing: Secondary | ICD-10-CM | POA: Diagnosis not present

## 2019-01-30 DIAGNOSIS — S22019D Unspecified fracture of first thoracic vertebra, subsequent encounter for fracture with routine healing: Secondary | ICD-10-CM | POA: Diagnosis not present

## 2019-01-30 DIAGNOSIS — S2220XD Unspecified fracture of sternum, subsequent encounter for fracture with routine healing: Secondary | ICD-10-CM | POA: Diagnosis not present

## 2019-01-30 DIAGNOSIS — S322XXD Fracture of coccyx, subsequent encounter for fracture with routine healing: Secondary | ICD-10-CM | POA: Diagnosis not present

## 2019-01-30 DIAGNOSIS — S82841D Displaced bimalleolar fracture of right lower leg, subsequent encounter for closed fracture with routine healing: Secondary | ICD-10-CM | POA: Diagnosis not present

## 2019-02-01 ENCOUNTER — Telehealth: Payer: Self-pay | Admitting: Family Medicine

## 2019-02-01 NOTE — Telephone Encounter (Signed)
Received home health form for completion. Charge form on [employer].

## 2019-02-01 NOTE — Telephone Encounter (Signed)
Placed on MD desk for signature.

## 2019-02-02 ENCOUNTER — Ambulatory Visit (INDEPENDENT_AMBULATORY_CARE_PROVIDER_SITE_OTHER): Payer: PPO | Admitting: Cardiology

## 2019-02-02 ENCOUNTER — Encounter: Payer: Self-pay | Admitting: Cardiology

## 2019-02-02 DIAGNOSIS — R42 Dizziness and giddiness: Secondary | ICD-10-CM | POA: Insufficient documentation

## 2019-02-02 DIAGNOSIS — R002 Palpitations: Secondary | ICD-10-CM | POA: Diagnosis not present

## 2019-02-02 HISTORY — DX: Palpitations: R00.2

## 2019-02-02 HISTORY — DX: Dizziness and giddiness: R42
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
## 2019-02-02 NOTE — Patient Instructions (Signed)
Medication Instructions:  Your physician recommends that you continue on your current medications as directed. Please refer to the Current Medication list given to you today.  If you need a refill on your cardiac medications before your next appointment, please call your pharmacy.   Lab work: Your physician recommends that you return for lab work today: TSH.   If you have labs (blood work) drawn today and your tests are completely normal, you will receive your results only by: Marland Kitchen MyChart Message (if you have MyChart) OR . A paper copy in the mail If you have any lab test that is abnormal or we need to change your treatment, we will call you to review the results.  Testing/Procedures: Your physician has requested that you have an echocardiogram. Echocardiography is a painless test that uses sound waves to create images of your heart. It provides your doctor with information about the size and shape of your heart and how well your heart's chambers and valves are working. This procedure takes approximately one hour. There are no restrictions for this procedure.  Your physician has recommended that you wear an event monitor. Event monitors are medical devices that record the heart's electrical activity. Doctors most often [place] these monitors to diagnose arrhythmias. Arrhythmias are problems with the speed or rhythm of the heartbeat. The monitor is a small, portable device. You can wear one while you do your normal daily activities. This is usually used to diagnose what is causing palpitations/syncope (passing out). Wear ZIO live for 14 days.     Follow-Up: At [facility], you and your health needs are our priority.  As part of our continuing mission to provide you with exceptional heart care, we have created designated Provider Care Teams.  These Care Teams include your primary Cardiologist (physician) and Advanced Practice Providers (APPs -  Physician Assistants and Nurse Practitioners) who all  work together to provide you with the care you need, when you need it. You will need a follow up appointment in 6 months.  Please call our office 2 months in advance to schedule this appointment.       Echocardiogram An echocardiogram is a procedure that uses painless sound waves (ultrasound) to produce an image of the heart. Images from an echocardiogram can provide important information about:  Signs of coronary artery disease (CAD).  Aneurysm detection. An aneurysm is a weak or damaged part of an artery wall that bulges out from the normal force of blood pumping through the body.  Heart size and shape. Changes in the size or shape of the heart can be associated with certain conditions, including heart failure, aneurysm, and CAD.  Heart muscle function.  Heart valve function.  Signs of a past heart attack.  Fluid buildup around the heart.  Thickening of the heart muscle.  A tumor or infectious growth around the heart valves. Tell a health care provider about:  Any allergies you have.  All medicines you are taking, including vitamins, herbs, eye drops, creams, and over-the-counter medicines.  Any blood disorders you have.  Any surgeries you have had.  Any medical conditions you have.  Whether you are pregnant or may be pregnant. What are the risks? Generally, this is a safe procedure. However, problems may occur, including:  Allergic reaction to dye (contrast) that may be used during the procedure. What happens before the procedure? No specific preparation is needed. You may eat and drink normally. What happens during the procedure?   An IV tube may  be inserted into one of your veins.  You may receive contrast through this tube. A contrast is an injection that improves the quality of the pictures from your heart.  A gel will be applied to your chest.  A wand-like tool (transducer) will be moved over your chest. The gel will help to transmit the sound waves from  the transducer.  The sound waves will harmlessly bounce off of your heart to allow the heart images to be captured in real-time motion. The images will be recorded on a computer. The procedure may vary among health care providers and hospitals. What happens after the procedure?  You may return to your normal, everyday life, including diet, activities, and medicines, unless your health care provider tells you not to do that. Summary  An echocardiogram is a procedure that uses painless sound waves (ultrasound) to produce an image of the heart.  Images from an echocardiogram can provide important information about the size and shape of your heart, heart muscle function, heart valve function, and fluid buildup around your heart.  You do not need to do anything to prepare before this procedure. You may eat and drink normally.  After the echocardiogram is completed, you may return to your normal, everyday life, unless your health care provider tells you not to do that. This information is not intended to replace advice given to you by your health care provider. Make sure you discuss any questions you have with your health care provider. Document Released: 12/11/2000 Document Revised: 01/16/2017 Document Reviewed: 01/16/2017 Elsevier Interactive Patient Education  2019 [employer].

## 2019-02-02 NOTE — Addendum Note (Signed)
Addended by: Austin Miles on: 02/02/2019 12:08 PM   Modules accepted: Orders

## 2019-02-03 ENCOUNTER — Ambulatory Visit: Payer: PPO | Admitting: Cardiology

## 2019-02-03 ENCOUNTER — Telehealth: Payer: Self-pay

## 2019-02-03 LAB — TSH: TSH: 1.38 u[IU]/mL (ref 0.450–4.500)

## 2019-02-03 NOTE — Telephone Encounter (Signed)
-----   Message from Jenean Lindau, MD sent at 02/03/2019  8:09 AM EST ----- The results of the study is unremarkable. Please inform patient. I will discuss in detail at next appointment. Cc  primary care/referring physician Jenean Lindau, MD 02/03/2019 8:09 AM

## 2019-02-03 NOTE — Telephone Encounter (Signed)
Patient called and notified of lab results. 

## 2019-02-06 ENCOUNTER — Ambulatory Visit (INDEPENDENT_AMBULATORY_CARE_PROVIDER_SITE_OTHER): Payer: PRIVATE HEALTH INSURANCE

## 2019-02-06 ENCOUNTER — Ambulatory Visit (HOSPITAL_BASED_OUTPATIENT_CLINIC_OR_DEPARTMENT_OTHER)
Admission: RE | Admit: 2019-02-06 | Discharge: 2019-02-06 | Disposition: A | Discharge status: Home / Self Care | Payer: PPO | Source: Ambulatory Visit | Attending: Cardiology | Admitting: Cardiology

## 2019-02-06 DIAGNOSIS — Z8673 Personal history of transient ischemic attack (TIA), and cerebral infarction without residual deficits: Secondary | ICD-10-CM | POA: Diagnosis not present

## 2019-02-06 DIAGNOSIS — R079 Chest pain, unspecified: Secondary | ICD-10-CM | POA: Diagnosis not present

## 2019-02-06 DIAGNOSIS — I351 Nonrheumatic aortic (valve) insufficiency: Secondary | ICD-10-CM | POA: Insufficient documentation

## 2019-02-06 DIAGNOSIS — D869 Sarcoidosis, unspecified: Secondary | ICD-10-CM | POA: Diagnosis not present

## 2019-02-06 DIAGNOSIS — R002 Palpitations: Secondary | ICD-10-CM | POA: Insufficient documentation

## 2019-02-06 DIAGNOSIS — R42 Dizziness and giddiness: Secondary | ICD-10-CM | POA: Diagnosis not present

## 2019-02-07 DIAGNOSIS — G4733 Obstructive sleep apnea (adult) (pediatric): Secondary | ICD-10-CM | POA: Diagnosis not present
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
## 2019-02-13 DIAGNOSIS — S82841D Displaced bimalleolar fracture of right lower leg, subsequent encounter for closed fracture with routine healing: Secondary | ICD-10-CM | POA: Diagnosis not present

## 2019-02-13 DIAGNOSIS — S12690D Other displaced fracture of seventh cervical vertebra, subsequent encounter for fracture with routine healing: Secondary | ICD-10-CM | POA: Diagnosis not present

## 2019-02-13 DIAGNOSIS — S22010D Wedge compression fracture of first thoracic vertebra, subsequent encounter for fracture with routine healing: Secondary | ICD-10-CM | POA: Diagnosis not present

## 2019-02-13 DIAGNOSIS — M542 Cervicalgia: Secondary | ICD-10-CM | POA: Diagnosis not present

## 2019-02-15 DIAGNOSIS — S22019D Unspecified fracture of first thoracic vertebra, subsequent encounter for fracture with routine healing: Secondary | ICD-10-CM | POA: Diagnosis not present

## 2019-02-15 DIAGNOSIS — S322XXD Fracture of coccyx, subsequent encounter for fracture with routine healing: Secondary | ICD-10-CM | POA: Diagnosis not present

## 2019-02-15 DIAGNOSIS — S12600D Unspecified displaced fracture of seventh cervical vertebra, subsequent encounter for fracture with routine healing: Secondary | ICD-10-CM | POA: Diagnosis not present

## 2019-02-15 DIAGNOSIS — S2220XD Unspecified fracture of sternum, subsequent encounter for fracture with routine healing: Secondary | ICD-10-CM | POA: Diagnosis not present

## 2019-02-15 DIAGNOSIS — S82841D Displaced bimalleolar fracture of right lower leg, subsequent encounter for closed fracture with routine healing: Secondary | ICD-10-CM | POA: Diagnosis not present

## 2019-02-20 ENCOUNTER — Telehealth: Payer: Self-pay | Admitting: *Deleted

## 2019-02-20 NOTE — Telephone Encounter (Signed)
Pt wearing 2 week Event Zio. She states that Dr. Geraldo Pitter told her she should wear the monitor longer if she didn't have any symptoms in the 14 days of wearing the monitor. This is not possible so need to know what to tell the patient. Please advise as to what pt needs to do next.

## 2019-02-20 NOTE — Telephone Encounter (Signed)
Please advise. The note doesn't say anything of this.

## 2019-02-20 NOTE — Telephone Encounter (Signed)
Will you make sure the patient is scheduled for another 2 weeks on the Zio monitor.

## 2019-02-20 NOTE — Telephone Encounter (Signed)
According to the company ([employer]) they will give the patient 2 more weeks of monitoring so can you please call them and check with them about it and we can put it on the patient for another 2 weeks without any extra cost

## 2019-02-21 DIAGNOSIS — S82844A Nondisplaced bimalleolar fracture of right lower leg, initial encounter for closed fracture: Secondary | ICD-10-CM | POA: Diagnosis not present

## 2019-02-22 ENCOUNTER — Other Ambulatory Visit: Payer: Self-pay | Admitting: *Deleted

## 2019-02-22 ENCOUNTER — Telehealth: Payer: Self-pay | Admitting: *Deleted

## 2019-02-22 DIAGNOSIS — R42 Dizziness and giddiness: Secondary | ICD-10-CM

## 2019-02-22 DIAGNOSIS — R002 Palpitations: Secondary | ICD-10-CM

## 2019-02-22 NOTE — Telephone Encounter (Signed)
Contacted pt to set up appointment to have her second Zio Event monitor applied. She stated that she didn't understand that she would have to have a whole other monitor put on. She would like to just wait and see if she starts having symptoms again and will let us know.

## 2019-02-23 DIAGNOSIS — S82844A Nondisplaced bimalleolar fracture of right lower leg, initial encounter for closed fracture: Secondary | ICD-10-CM | POA: Diagnosis not present

## 2019-02-24 ENCOUNTER — Encounter: Payer: Self-pay | Admitting: Family Medicine

## 2019-02-24 DIAGNOSIS — I499 Cardiac arrhythmia, unspecified: Secondary | ICD-10-CM

## 2019-02-24 HISTORY — DX: Cardiac arrhythmia, unspecified: I49.9

## 2019-02-24 NOTE — Telephone Encounter (Signed)
FYI

## 2019-02-25 DIAGNOSIS — S322XXA Fracture of coccyx, initial encounter for closed fracture: Secondary | ICD-10-CM | POA: Diagnosis not present

## 2019-02-25 DIAGNOSIS — S82421A Displaced transverse fracture of shaft of right fibula, initial encounter for closed fracture: Secondary | ICD-10-CM | POA: Diagnosis not present

## 2019-02-28 DIAGNOSIS — S82844A Nondisplaced bimalleolar fracture of right lower leg, initial encounter for closed fracture: Secondary | ICD-10-CM | POA: Diagnosis not present

## 2019-02-28 DIAGNOSIS — S82841D Displaced bimalleolar fracture of right lower leg, subsequent encounter for closed fracture with routine healing: Secondary | ICD-10-CM | POA: Diagnosis not present

## 2019-03-02 DIAGNOSIS — S82844A Nondisplaced bimalleolar fracture of right lower leg, initial encounter for closed fracture: Secondary | ICD-10-CM | POA: Diagnosis not present

## 2019-03-07 DIAGNOSIS — S82844A Nondisplaced bimalleolar fracture of right lower leg, initial encounter for closed fracture: Secondary | ICD-10-CM | POA: Diagnosis not present

## 2019-03-08 DIAGNOSIS — G4733 Obstructive sleep apnea (adult) (pediatric): Secondary | ICD-10-CM | POA: Diagnosis not present

## 2019-03-09 DIAGNOSIS — S82844A Nondisplaced bimalleolar fracture of right lower leg, initial encounter for closed fracture: Secondary | ICD-10-CM | POA: Diagnosis not present

## 2019-03-14 DIAGNOSIS — S82844A Nondisplaced bimalleolar fracture of right lower leg, initial encounter for closed fracture: Secondary | ICD-10-CM | POA: Diagnosis not present

## 2019-03-17 DIAGNOSIS — S82844A Nondisplaced bimalleolar fracture of right lower leg, initial encounter for closed fracture: Secondary | ICD-10-CM | POA: Diagnosis not present

## 2019-03-20 DIAGNOSIS — S82841D Displaced bimalleolar fracture of right lower leg, subsequent encounter for closed fracture with routine healing: Secondary | ICD-10-CM | POA: Diagnosis not present

## 2019-03-21 DIAGNOSIS — S82844A Nondisplaced bimalleolar fracture of right lower leg, initial encounter for closed fracture: Secondary | ICD-10-CM | POA: Diagnosis not present

## 2019-03-23 DIAGNOSIS — S82844A Nondisplaced bimalleolar fracture of right lower leg, initial encounter for closed fracture: Secondary | ICD-10-CM | POA: Diagnosis not present

## 2019-03-26 DIAGNOSIS — S82421A Displaced transverse fracture of shaft of right fibula, initial encounter for closed fracture: Secondary | ICD-10-CM | POA: Diagnosis not present

## 2019-03-26 DIAGNOSIS — S322XXA Fracture of coccyx, initial encounter for closed fracture: Secondary | ICD-10-CM | POA: Diagnosis not present

## 2019-03-28 DIAGNOSIS — S82844A Nondisplaced bimalleolar fracture of right lower leg, initial encounter for closed fracture: Secondary | ICD-10-CM | POA: Diagnosis not present

## 2019-03-30 DIAGNOSIS — H353221 Exudative age-related macular degeneration, left eye, with active choroidal neovascularization: Secondary | ICD-10-CM | POA: Diagnosis not present

## 2019-03-30 DIAGNOSIS — M545 Low back pain: Secondary | ICD-10-CM | POA: Diagnosis not present

## 2019-03-30 DIAGNOSIS — S82844A Nondisplaced bimalleolar fracture of right lower leg, initial encounter for closed fracture: Secondary | ICD-10-CM | POA: Diagnosis not present

## 2019-03-30 DIAGNOSIS — H2513 Age-related nuclear cataract, bilateral: Secondary | ICD-10-CM | POA: Diagnosis not present

## 2019-03-30 DIAGNOSIS — R112 Nausea with vomiting, unspecified: Secondary | ICD-10-CM | POA: Diagnosis not present

## 2019-03-30 DIAGNOSIS — E1129 Type 2 diabetes mellitus with other diabetic kidney complication: Secondary | ICD-10-CM | POA: Diagnosis not present

## 2019-04-04 DIAGNOSIS — Z792 Long term (current) use of antibiotics: Secondary | ICD-10-CM | POA: Diagnosis not present

## 2019-04-04 DIAGNOSIS — S14102S Unspecified injury at C2 level of cervical spinal cord, sequela: Secondary | ICD-10-CM | POA: Diagnosis not present

## 2019-04-04 DIAGNOSIS — M25562 Pain in left knee: Secondary | ICD-10-CM | POA: Diagnosis not present

## 2019-04-04 DIAGNOSIS — Z9181 History of falling: Secondary | ICD-10-CM | POA: Diagnosis not present

## 2019-04-04 DIAGNOSIS — L89322 Pressure ulcer of left buttock, stage 2: Secondary | ICD-10-CM | POA: Diagnosis not present

## 2019-04-04 DIAGNOSIS — S90822D Blister (nonthermal), left foot, subsequent encounter: Secondary | ICD-10-CM | POA: Diagnosis not present

## 2019-04-04 DIAGNOSIS — Z993 Dependence on wheelchair: Secondary | ICD-10-CM | POA: Diagnosis not present

## 2019-04-04 DIAGNOSIS — G4733 Obstructive sleep apnea (adult) (pediatric): Secondary | ICD-10-CM | POA: Diagnosis not present

## 2019-04-04 DIAGNOSIS — Z466 Encounter for fitting and adjustment of urinary device: Secondary | ICD-10-CM | POA: Diagnosis not present

## 2019-04-04 DIAGNOSIS — S90821D Blister (nonthermal), right foot, subsequent encounter: Secondary | ICD-10-CM | POA: Diagnosis not present

## 2019-04-04 DIAGNOSIS — G8252 Quadriplegia, C1-C4 incomplete: Secondary | ICD-10-CM | POA: Diagnosis not present

## 2019-04-04 DIAGNOSIS — Z79899 Other long term (current) drug therapy: Secondary | ICD-10-CM | POA: Diagnosis not present

## 2019-04-04 DIAGNOSIS — K5904 Chronic idiopathic constipation: Secondary | ICD-10-CM | POA: Diagnosis not present

## 2019-04-04 DIAGNOSIS — Z96652 Presence of left artificial knee joint: Secondary | ICD-10-CM | POA: Diagnosis not present

## 2019-04-08 DIAGNOSIS — G4733 Obstructive sleep apnea (adult) (pediatric): Secondary | ICD-10-CM | POA: Diagnosis not present

## 2019-04-11 ENCOUNTER — Encounter: Payer: Self-pay | Admitting: *Deleted

## 2019-04-11 ENCOUNTER — Telehealth: Payer: Self-pay | Admitting: *Deleted

## 2019-04-11 NOTE — Telephone Encounter (Signed)
Called pt to update her chart information (medication list, pharmacy and allergy list, health history) for her virtual visit on 04/13/19.

## 2019-04-12 DIAGNOSIS — S82844A Nondisplaced bimalleolar fracture of right lower leg, initial encounter for closed fracture: Secondary | ICD-10-CM | POA: Diagnosis not present

## 2019-04-13 ENCOUNTER — Encounter: Payer: Self-pay | Admitting: Neurology

## 2019-04-13 ENCOUNTER — Ambulatory Visit (INDEPENDENT_AMBULATORY_CARE_PROVIDER_SITE_OTHER): Payer: PPO | Admitting: Neurology

## 2019-04-13 ENCOUNTER — Other Ambulatory Visit: Payer: Self-pay

## 2019-04-13 DIAGNOSIS — R32 Unspecified urinary incontinence: Secondary | ICD-10-CM | POA: Diagnosis not present

## 2019-04-13 DIAGNOSIS — R42 Dizziness and giddiness: Secondary | ICD-10-CM | POA: Diagnosis not present

## 2019-04-13 DIAGNOSIS — G4733 Obstructive sleep apnea (adult) (pediatric): Secondary | ICD-10-CM | POA: Diagnosis not present

## 2019-04-13 DIAGNOSIS — R404 Transient alteration of awareness: Secondary | ICD-10-CM

## 2019-04-13 DIAGNOSIS — D71 Functional disorders of polymorphonuclear neutrophils: Secondary | ICD-10-CM

## 2019-04-13 DIAGNOSIS — R002 Palpitations: Secondary | ICD-10-CM | POA: Diagnosis not present

## 2019-04-13 HISTORY — DX: Transient alteration of awareness: R40.4
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
## 2019-04-19 ENCOUNTER — Ambulatory Visit: Payer: PPO | Admitting: Neurology

## 2019-04-19 DIAGNOSIS — S82844A Nondisplaced bimalleolar fracture of right lower leg, initial encounter for closed fracture: Secondary | ICD-10-CM | POA: Diagnosis not present

## 2019-04-26 DIAGNOSIS — S322XXA Fracture of coccyx, initial encounter for closed fracture: Secondary | ICD-10-CM | POA: Diagnosis not present

## 2019-04-26 DIAGNOSIS — S82844A Nondisplaced bimalleolar fracture of right lower leg, initial encounter for closed fracture: Secondary | ICD-10-CM | POA: Diagnosis not present

## 2019-04-26 DIAGNOSIS — S82421A Displaced transverse fracture of shaft of right fibula, initial encounter for closed fracture: Secondary | ICD-10-CM | POA: Diagnosis not present

## 2019-05-01 ENCOUNTER — Encounter: Payer: Self-pay | Admitting: Family Medicine

## 2019-05-01 ENCOUNTER — Ambulatory Visit: Payer: Self-pay | Admitting: Family Medicine

## 2019-05-01 ENCOUNTER — Ambulatory Visit (INDEPENDENT_AMBULATORY_CARE_PROVIDER_SITE_OTHER): Payer: PRIVATE HEALTH INSURANCE | Admitting: Family Medicine

## 2019-05-01 VITALS — BP 150/78 | HR 94 | Ht 63.0 in

## 2019-05-01 DIAGNOSIS — R32 Unspecified urinary incontinence: Secondary | ICD-10-CM

## 2019-05-01 DIAGNOSIS — R569 Unspecified convulsions: Secondary | ICD-10-CM | POA: Diagnosis not present

## 2019-05-01 DIAGNOSIS — R42 Dizziness and giddiness: Secondary | ICD-10-CM
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
## 2019-05-01 NOTE — Telephone Encounter (Signed)
Pt. Reports she has had dizziness recently. Had this last year. States she saw cardiology this year and was on a monitor and "was cleared." Had visit with neurology and is scheduled for an EEG in June. Request a visit with Dr. Raoul Pitch. Warm transfer to [facility] in the practice.  Answer Assessment - Initial Assessment Questions 1. DESCRIPTION: "Describe your dizziness."     Lightheaded 2. LIGHTHEADED: "Do you feel lightheaded?" (e.g., somewhat faint, woozy, weak upon standing)     Dizzy 3. VERTIGO: "Do you feel like either you or the room is spinning or tilting?" (i.e. vertigo)     No 4. SEVERITY: "How bad is it?"  "Do you feel like you are going to faint?" "Can you stand and walk?"   - MILD - walking normally   - MODERATE - interferes with normal activities (e.g., work, school)    - SEVERE - unable to stand, requires support to walk, feels like passing out now.      Moderate 5. ONSET:  "When did the dizziness begin?"     Last year, but has started again 6. AGGRAVATING FACTORS: "Does anything make it worse?" (e.g., standing, change in head position)     Anytime 7. HEART RATE: "Can you tell me your heart rate?" "How many beats in 15 seconds?"  (Note: not all patients can do this)       Heart rate goes up 107 8. CAUSE: "What do you think is causing the dizziness?"     Unsure 9. RECURRENT SYMPTOM: "Have you had dizziness before?" If so, ask: "When was the last time?" "What happened that time?"     Yes 10. OTHER SYMPTOMS: "Do you have any other symptoms?" (e.g., fever, chest pain, vomiting, diarrhea, bleeding)       Sweaty 11. PREGNANCY: "Is there any chance you are pregnant?" "When was your last menstrual period?"       No  Protocols used: DIZZINESS [facility]

## 2019-05-02 ENCOUNTER — Encounter: Payer: Self-pay | Admitting: Family Medicine

## 2019-05-02 ENCOUNTER — Telehealth: Payer: Self-pay | Admitting: Family Medicine

## 2019-05-02 ENCOUNTER — Telehealth: Payer: Self-pay | Admitting: Neurology

## 2019-05-02 NOTE — Telephone Encounter (Signed)
Per PCP, spoke with Bary Castilla at [facility] to move up EEG (currently scheduled for 05/29/2019) or schedule pt to be seen by Dr. Felecia Shelling.  Message being forwarded to Dr. Garth Bigness nurse Terrence Dupont) to schedule pt appt for virtual visit. Direct number provided for return call.   Patient informed of above.

## 2019-05-02 NOTE — Telephone Encounter (Signed)
Dr. Felecia Shelling- what would you like to do? Her EEG is not scheduled until 05/29/19 that you previously ordered.

## 2019-05-02 NOTE — Telephone Encounter (Signed)
My chart message from pt: Dr. Raoul Pitch, I just wanted to touch base and let you know that I am still have frequent events. I had five events yesterday. the last one at 8 pm I lost control of my bladder. My blood pressure doesn't seem to be going up as high (172/85, 163/82, 159/79, 144/71) but my pulse still is going up from 93 to 99. I didn't sleep well at all last night. My restless legs were bothering me a lot. I had an event around 2 am this morning and another one around 6 am. I have gotten up with a really bad headache this morning. Thank you, Vannie Hilgert, DOB [birth year] Telephone [phone].

## 2019-05-02 NOTE — Telephone Encounter (Signed)
Please inform Ms. Delsol that we are attempting to contact Dr. Sherron Flemings office to see if he can reevaluate her and consider moving her up on the EEG schedule if possible.  We will contact her soon as we hear anything.  I would also like to go ahead and pursue some laboratory evaluations since she communicated by a chart that her increased frequency and her episodes have continued today as well.  Can you please call them first thing in the morning and see if they can get all labs scheduled for today.

## 2019-05-02 NOTE — Telephone Encounter (Signed)
Kim from Dr Lucita Lora office is calling wanting to know if the patient can be seen for a follow up soon due to the dizziness worsening since pts appt on 04/13/2019

## 2019-05-02 NOTE — Telephone Encounter (Signed)
Grace Schmitt, Is it possible to move her EEG up to this week or next week?  Thank you

## 2019-05-02 NOTE — Patient Instructions (Signed)
Seizure, Adult °When you have a seizure: °· Parts of your body may move. °· You may have a change in how aware or awake (conscious) you are. °· You may shake (convulse). °Seizures usually last from 30 seconds to 2 minutes. Usually, they are not harmful unless they last a long time. °What are the signs or symptoms? °Common symptoms of this condition include: °· Shaking (convulsions). °· Stiffness in the body. °· Passing out (losing consciousness). °· Uncontrolled movements in the: °? Arms or legs. °? Eyes. °? Head. °? Mouth. °Some people have symptoms right before a seizure happens. These symptoms may include: °· Fear. °· Worry (anxiety). °· Feeling like you are going to throw up (nausea). °· Feeling like the room is spinning (vertigo). °· Feeling like you saw or heard something before (déjà vu). °· Odd tastes or smells. °· Changes in vision, such as seeing flashing lights or spots. °Follow these instructions at home: °Medicines ° °· Take over-the-counter and prescription medicines only as told by your doctor. °· Do not eat or drink anything that may keep your medicine from working, such as alcohol. °Activity °· Do not do any activities that would be dangerous if you had another seizure, like driving or swimming. Wait until your doctor says it is safe for you to do them. °· If you live in the [place] ask your local DMV (department of motor vehicles) when you can drive. °· Get plenty of rest. °Teaching others ° °· Teach friends and family what to do when you have a seizure. They should: °? Lay you on the ground. °? Protect your head and body. °? Loosen any tight clothing around your neck. °? Turn you on your side. °? Not hold you down. °? Not put anything into your mouth. °? Know whether or not you need emergency care. °? Stay with you until you are better. °General instructions °· Contact your doctor each time you have a seizure. °· Avoid anything that gives you seizures. °· Keep a seizure diary. Write down: °? What  you think caused each seizure. °? What you remember about each seizure. °· Keep all follow-up visits as told by your doctor. This is important. °Contact a doctor if: °· You have another seizure. °· You have seizures more often. °· There is any change in what happens during your seizures. °· You keep having seizures with treatment. °· You have symptoms of being sick or having an infection. °Get help right away if: °· You have a seizure: °? That lasts longer than 5 minutes. °? That is different than seizures you had before. °? That makes it harder to breathe. °? After you hurt your head. °· After a seizure, you cannot speak or use a part of your body. °· After a seizure, you are confused or have a bad headache. °· You have two or more seizures in a row. °· You are having seizures more often. °· You do not wake up right after a seizure. °· You get hurt during a seizure. °In an emergency: °· These symptoms may be an emergency. Do not wait to see if the symptoms will go away. Get medical help right away. Call your local emergency services (911 in the [place] Do not drive yourself to the hospital. °Summary °· Seizures usually last from 30 seconds to 2 minutes. Usually, they are not harmful unless they last a long time. °· Do not eat or drink anything that may keep your medicine from working, such as alcohol. °·   Teach friends and family what to do when you have a seizure. °· Contact your doctor each time you have a seizure. °This information is not intended to replace advice given to you by your health care provider. Make sure you discuss any questions you have with your health care provider. °Document Released: 06/01/2008 Document Revised: 09/07/2018 Document Reviewed: 01/20/2018 °Elsevier Interactive Patient Education © 2019 [employer] ° °

## 2019-05-02 NOTE — Telephone Encounter (Signed)
CB# [phone]  Advise PCP of decision

## 2019-05-03 ENCOUNTER — Other Ambulatory Visit (INDEPENDENT_AMBULATORY_CARE_PROVIDER_SITE_OTHER): Payer: PRIVATE HEALTH INSURANCE

## 2019-05-03 ENCOUNTER — Other Ambulatory Visit: Payer: Self-pay

## 2019-05-03 DIAGNOSIS — R32 Unspecified urinary incontinence: Secondary | ICD-10-CM

## 2019-05-03 DIAGNOSIS — R42 Dizziness and giddiness: Secondary | ICD-10-CM

## 2019-05-03 DIAGNOSIS — R569 Unspecified convulsions: Secondary | ICD-10-CM | POA: Diagnosis not present

## 2019-05-03 LAB — COMPREHENSIVE METABOLIC PANEL
ALT: 14 U/L (ref 0–35)
AST: 13 U/L (ref 0–37)
Albumin: 4.4 g/dL (ref 3.5–5.2)
Alkaline Phosphatase: 101 U/L (ref 39–117)
BUN: 21 mg/dL (ref 6–23)
CO2: 27 mEq/L (ref 19–32)
Calcium: 9.5 mg/dL (ref 8.4–10.5)
Chloride: 101 mEq/L (ref 96–112)
Creatinine, Ser: 0.68 mg/dL (ref 0.40–1.20)
GFR: 84.59 mL/min (ref >60.00)
Glucose, Bld: 113 mg/dL — ABNORMAL HIGH (ref 70–99)
Potassium: 4.3 mEq/L (ref 3.5–5.1)
Sodium: 138 mEq/L (ref 135–145)
Total Bilirubin: 0.4 mg/dL (ref 0.2–1.2)
Total Protein: 7.7 g/dL (ref 6.0–8.3)

## 2019-05-03 LAB — CBC WITH DIFFERENTIAL/PLATELET
Basophils Absolute: 0 10*3/uL (ref 0.0–0.1)
Basophils Relative: 0.3 % (ref 0.0–3.0)
Eosinophils Absolute: 0.1 10*3/uL (ref 0.0–0.7)
Eosinophils Relative: 1.1 % (ref 0.0–5.0)
HCT: 42.1 % (ref 36.0–46.0)
Hemoglobin: 14.1 g/dL (ref 12.0–15.0)
Lymphocytes Relative: 17.2 % (ref 12.0–46.0)
Lymphs Abs: 1.9 10*3/uL (ref 0.7–4.0)
MCHC: 33.5 g/dL (ref 30.0–36.0)
MCV: 92.8 fl (ref 78.0–100.0)
Monocytes Absolute: 0.9 10*3/uL (ref 0.1–1.0)
Monocytes Relative: 7.8 % (ref 3.0–12.0)
Neutro Abs: 8.1 10*3/uL — ABNORMAL HIGH (ref 1.4–7.7)
Neutrophils Relative %: 73.6 % (ref 43.0–77.0)
Platelets: 360 10*3/uL (ref 150.0–400.0)
RBC: 4.54 Mil/uL (ref 3.87–5.11)
RDW: 13.7 % (ref 11.5–15.5)
WBC: 11 10*3/uL — ABNORMAL HIGH (ref 4.0–10.5)

## 2019-05-03 LAB — MAGNESIUM: Magnesium: 2.2 mg/dL (ref 1.5–2.5)

## 2019-05-03 LAB — CK: Total CK: 49 U/L (ref 7–177)

## 2019-05-03 NOTE — Telephone Encounter (Signed)
Called pt. Scheduled EEG for 815am tomorrow morning. Advised pt to check in by 8am and to wear a facemask. She is the only one allowed back for appt. She denies any sx of covid-19 or being exposed to anyone with covid-19.

## 2019-05-03 NOTE — Telephone Encounter (Signed)
Pt was called and given information. She is scheduled for labs this AM and agreed with plan.

## 2019-05-04 ENCOUNTER — Telehealth: Payer: Self-pay | Admitting: Neurology

## 2019-05-04 ENCOUNTER — Other Ambulatory Visit: Payer: PPO

## 2019-05-04 ENCOUNTER — Telehealth: Payer: Self-pay | Admitting: Family Medicine

## 2019-05-04 LAB — URINALYSIS, ROUTINE W REFLEX MICROSCOPIC
Bacteria, UA: NONE SEEN /HPF
Bilirubin Urine: NEGATIVE
Glucose, UA: NEGATIVE
Hgb urine dipstick: NEGATIVE
Hyaline Cast: NONE SEEN /LPF
Ketones, ur: NEGATIVE
Leukocytes,Ua: NEGATIVE
Nitrite: NEGATIVE
Specific Gravity, Urine: 1.024 (ref 1.001–1.03)
Squamous Epithelial / HPF: NONE SEEN /HPF (ref <=5)
pH: 6.5 (ref 5.0–8.0)

## 2019-05-04 LAB — URINE CULTURE
MICRO NUMBER:: 450421
Result:: NO GROWTH
SPECIMEN QUALITY:: ADEQUATE

## 2019-05-04 NOTE — Telephone Encounter (Signed)
Please inform patient the following information: Her labs are mostly normal. She had a mild increase in white blood cells. Which can be seen if she was having seizures/stress, mild infection, dehydration- we will know more after eeg and will monitor WBC  We are still awaiting the urine culture results and will call her after.

## 2019-05-04 NOTE — Telephone Encounter (Signed)
I spoke to Grace Schmitt.  EEG did not show any seizures or sharp waves though there was mild frontal asymmetry, mildly slower on the left.  This is likely incidental but due to her symptoms, we will check MRI to r/o a structural etiology.  She had 5-7 spells a day Sunday - Tuesday but only 1 spell yesterday and none today.  If spells increase or significant MRI finding, we may consider a trial of an AED to see if any benefit.

## 2019-05-04 NOTE — Telephone Encounter (Signed)
Pt was called and given results, she verbalized understanding

## 2019-05-08 DIAGNOSIS — S82841D Displaced bimalleolar fracture of right lower leg, subsequent encounter for closed fracture with routine healing: Secondary | ICD-10-CM | POA: Diagnosis not present

## 2019-05-08 DIAGNOSIS — G4733 Obstructive sleep apnea (adult) (pediatric): Secondary | ICD-10-CM | POA: Diagnosis not present

## 2019-05-09 ENCOUNTER — Telehealth: Payer: Self-pay | Admitting: Neurology

## 2019-05-09 DIAGNOSIS — R569 Unspecified convulsions: Secondary | ICD-10-CM

## 2019-05-09 NOTE — Telephone Encounter (Signed)
Pt called and states she was told that a brain scan would be ordered for her and she wanted to know the update on that. Please advise.

## 2019-05-10 NOTE — Telephone Encounter (Signed)
Patient is scheduled at [facility] for 05/17/19

## 2019-05-10 NOTE — Telephone Encounter (Signed)
Noted  

## 2019-05-10 NOTE — Telephone Encounter (Signed)
I called GI and there first available was not until May 27th. I faxed the order to [facility] and they will reach out to the patient to schedule.   Health Team: No auth.

## 2019-05-11 DIAGNOSIS — R569 Unspecified convulsions: Secondary | ICD-10-CM | POA: Diagnosis not present

## 2019-05-11 NOTE — Telephone Encounter (Signed)
[facility] had a cancellation and got her in for today 05/11/19

## 2019-05-15 ENCOUNTER — Ambulatory Visit: Payer: PPO | Admitting: Pulmonary Disease

## 2019-05-17 DIAGNOSIS — S82844A Nondisplaced bimalleolar fracture of right lower leg, initial encounter for closed fracture: Secondary | ICD-10-CM | POA: Diagnosis not present

## 2019-05-18 ENCOUNTER — Telehealth: Payer: Self-pay | Admitting: Neurology

## 2019-05-18 DIAGNOSIS — H9012 Conductive hearing loss, unilateral, left ear, with unrestricted hearing on the contralateral side: Secondary | ICD-10-CM | POA: Insufficient documentation

## 2019-05-18 DIAGNOSIS — H6522 Chronic serous otitis media, left ear: Secondary | ICD-10-CM | POA: Insufficient documentation

## 2019-05-18 DIAGNOSIS — H9192 Unspecified hearing loss, left ear: Secondary | ICD-10-CM | POA: Diagnosis not present

## 2019-05-18 DIAGNOSIS — H9312 Tinnitus, left ear: Secondary | ICD-10-CM

## 2019-05-18 HISTORY — DX: Tinnitus, left ear: H93.12

## 2019-05-18 HISTORY — DX: Chronic serous otitis media, left ear: H65.22

## 2019-05-18 HISTORY — DX: Conductive hearing loss, unilateral, left ear, with unrestricted hearing on the contralateral side: H90.12

## 2019-05-18 NOTE — Telephone Encounter (Signed)
I spoke to Grace Schmitt about the MRI results.  It shows atrophy and chronic microvascular ischemic changes but no acute findings.  No large vessel strokes.  No source of the seizure of seizures.  We had a discussion about the chronic microvascular ischemic changes that are more than expected for age.  She also notes that her mom had a similar finding.  The etiology is uncertain as she does not have typical risk factors such as hypertension, diabetes or smoking.  There is no definite treatment.  She is already taking Krill oil.   She has not had any more spells over the last 10 days although she had a cluster of spells about 2 weeks ago.  As there was no epileptiform activity on EEG, I will hold off on adding any specific treatment.  We discussed that if another cluster occurs or if she begins to have more spells I would consider placing her on an epilepsy drug to see if there is a benefit, such as levetiracetam 500 mg twice daily.

## 2019-05-19 DIAGNOSIS — M8580 Other specified disorders of bone density and structure, unspecified site: Secondary | ICD-10-CM | POA: Diagnosis not present

## 2019-05-19 DIAGNOSIS — E039 Hypothyroidism, unspecified: Secondary | ICD-10-CM | POA: Diagnosis not present

## 2019-05-23 DIAGNOSIS — S82844A Nondisplaced bimalleolar fracture of right lower leg, initial encounter for closed fracture: Secondary | ICD-10-CM | POA: Diagnosis not present

## 2019-05-23 DIAGNOSIS — E039 Hypothyroidism, unspecified: Secondary | ICD-10-CM | POA: Diagnosis not present

## 2019-05-23 DIAGNOSIS — R55 Syncope and collapse: Secondary | ICD-10-CM | POA: Diagnosis not present

## 2019-05-23 DIAGNOSIS — R000 Tachycardia, unspecified: Secondary | ICD-10-CM | POA: Diagnosis not present

## 2019-05-23 DIAGNOSIS — M8580 Other specified disorders of bone density and structure, unspecified site: Secondary | ICD-10-CM | POA: Diagnosis not present

## 2019-05-23 DIAGNOSIS — D869 Sarcoidosis, unspecified: Secondary | ICD-10-CM | POA: Diagnosis not present

## 2019-05-24 DIAGNOSIS — R000 Tachycardia, unspecified: Secondary | ICD-10-CM | POA: Diagnosis not present

## 2019-05-24 DIAGNOSIS — R55 Syncope and collapse: Secondary | ICD-10-CM | POA: Diagnosis not present

## 2019-05-26 DIAGNOSIS — S322XXA Fracture of coccyx, initial encounter for closed fracture: Secondary | ICD-10-CM | POA: Diagnosis not present

## 2019-05-26 DIAGNOSIS — S82421A Displaced transverse fracture of shaft of right fibula, initial encounter for closed fracture: Secondary | ICD-10-CM | POA: Diagnosis not present

## 2019-05-29 ENCOUNTER — Other Ambulatory Visit: Payer: PPO

## 2019-05-30 DIAGNOSIS — S82844A Nondisplaced bimalleolar fracture of right lower leg, initial encounter for closed fracture: Secondary | ICD-10-CM | POA: Diagnosis not present

## 2019-06-01 DIAGNOSIS — S82844A Nondisplaced bimalleolar fracture of right lower leg, initial encounter for closed fracture: Secondary | ICD-10-CM | POA: Diagnosis not present

## 2019-06-02 ENCOUNTER — Ambulatory Visit (INDEPENDENT_AMBULATORY_CARE_PROVIDER_SITE_OTHER): Payer: PRIVATE HEALTH INSURANCE | Admitting: Family Medicine

## 2019-06-02 ENCOUNTER — Encounter: Payer: Self-pay | Admitting: Family Medicine

## 2019-06-02 ENCOUNTER — Other Ambulatory Visit: Payer: Self-pay

## 2019-06-02 VITALS — BP 125/73 | HR 73 | Ht 63.0 in | Wt 173.0 lb

## 2019-06-02 DIAGNOSIS — I679 Cerebrovascular disease, unspecified: Secondary | ICD-10-CM

## 2019-06-02 DIAGNOSIS — Z79899 Other long term (current) drug therapy: Secondary | ICD-10-CM

## 2019-06-02 DIAGNOSIS — E7800 Pure hypercholesterolemia, unspecified: Secondary | ICD-10-CM | POA: Diagnosis not present

## 2019-06-02 DIAGNOSIS — I6789 Other cerebrovascular disease: Secondary | ICD-10-CM | POA: Insufficient documentation

## 2019-06-02 DIAGNOSIS — R42 Dizziness and giddiness: Secondary | ICD-10-CM | POA: Diagnosis not present

## 2019-06-02 DIAGNOSIS — R404 Transient alteration of awareness: Secondary | ICD-10-CM

## 2019-06-02 DIAGNOSIS — D72829 Elevated white blood cell count, unspecified: Secondary | ICD-10-CM | POA: Diagnosis not present

## 2019-06-02 HISTORY — DX: Other cerebrovascular disease: I67.89

## 2019-06-02 MED ORDER — ATORVASTATIN CALCIUM 10 MG PO TABS: 10.0000 mg | ORAL_TABLET | Freq: Every day | ORAL | 3 refills | Status: DC

## 2019-06-02 NOTE — Progress Notes (Signed)
I have discussed the procedure for the virtual visit with the patient who has given consent to proceed with assessment and treatment.   Alyssia Heese, CMA     
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
## 2019-06-06 DIAGNOSIS — S82844A Nondisplaced bimalleolar fracture of right lower leg, initial encounter for closed fracture: Secondary | ICD-10-CM | POA: Diagnosis not present

## 2019-06-08 DIAGNOSIS — G4733 Obstructive sleep apnea (adult) (pediatric): Secondary | ICD-10-CM | POA: Diagnosis not present

## 2019-06-13 ENCOUNTER — Telehealth: Payer: Self-pay

## 2019-06-13 DIAGNOSIS — M8580 Other specified disorders of bone density and structure, unspecified site: Secondary | ICD-10-CM

## 2019-06-13 NOTE — Telephone Encounter (Signed)
Ordering of preventative health studies are typically completed during a patient's Medicare wellness visit or with their annual physical in the office- whichever their insurance covers.   - She has a history of osteopenia by her prior bone density completed 09/09/2016. If she desires a bone density scan through [facility] she may receive one secondary to her history of osteopenia and a fracture. -  Results must be abstracted into the system and If abnormal they must be  flagged for provider review and pt schedule for OV to discuss.
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
## 2019-06-13 NOTE — Telephone Encounter (Signed)
Pt was sent my chart message to schedule medicare wellness exam or CPE (whichever insurance covers) to get this ordered.

## 2019-06-21 ENCOUNTER — Encounter: Payer: Self-pay | Admitting: Family Medicine

## 2019-06-21 NOTE — Telephone Encounter (Signed)
FYI

## 2019-06-26 ENCOUNTER — Other Ambulatory Visit: Payer: Self-pay | Admitting: Neurology

## 2019-06-26 DIAGNOSIS — S82421A Displaced transverse fracture of shaft of right fibula, initial encounter for closed fracture: Secondary | ICD-10-CM | POA: Diagnosis not present

## 2019-06-26 DIAGNOSIS — S322XXA Fracture of coccyx, initial encounter for closed fracture: Secondary | ICD-10-CM | POA: Diagnosis not present

## 2019-06-26 MED ORDER — LEVETIRACETAM 250 MG PO TABS: 250.0000 mg | ORAL_TABLET | Freq: Two times a day (BID) | ORAL | 5 refills | Status: DC

## 2019-06-27 ENCOUNTER — Encounter: Payer: Self-pay | Admitting: Family Medicine

## 2019-06-27 NOTE — Telephone Encounter (Signed)
Pt wrote the following my chart message. Please advise.

## 2019-07-08 DIAGNOSIS — G4733 Obstructive sleep apnea (adult) (pediatric): Secondary | ICD-10-CM | POA: Diagnosis not present

## 2019-07-11 DIAGNOSIS — R569 Unspecified convulsions: Secondary | ICD-10-CM | POA: Insufficient documentation

## 2019-07-11 DIAGNOSIS — G2581 Restless legs syndrome: Secondary | ICD-10-CM | POA: Diagnosis not present

## 2019-07-11 HISTORY — DX: Unspecified convulsions: R56.9

## 2019-07-13 DIAGNOSIS — G4733 Obstructive sleep apnea (adult) (pediatric): Secondary | ICD-10-CM | POA: Diagnosis not present

## 2019-07-17 DIAGNOSIS — S82841D Displaced bimalleolar fracture of right lower leg, subsequent encounter for closed fracture with routine healing: Secondary | ICD-10-CM | POA: Diagnosis not present

## 2019-07-26 DIAGNOSIS — S82421A Displaced transverse fracture of shaft of right fibula, initial encounter for closed fracture: Secondary | ICD-10-CM | POA: Diagnosis not present

## 2019-07-26 DIAGNOSIS — S322XXA Fracture of coccyx, initial encounter for closed fracture: Secondary | ICD-10-CM | POA: Diagnosis not present

## 2019-07-27 DIAGNOSIS — H6522 Chronic serous otitis media, left ear: Secondary | ICD-10-CM | POA: Diagnosis not present

## 2019-08-08 DIAGNOSIS — G4733 Obstructive sleep apnea (adult) (pediatric): Secondary | ICD-10-CM | POA: Diagnosis not present

## 2019-08-16 DIAGNOSIS — M8588 Other specified disorders of bone density and structure, other site: Secondary | ICD-10-CM | POA: Diagnosis not present

## 2019-08-16 DIAGNOSIS — Z6833 Body mass index (BMI) 33.0-33.9, adult: Secondary | ICD-10-CM | POA: Diagnosis not present

## 2019-08-16 DIAGNOSIS — Z1231 Encounter for screening mammogram for malignant neoplasm of breast: Secondary | ICD-10-CM | POA: Diagnosis not present

## 2019-08-16 DIAGNOSIS — N958 Other specified menopausal and perimenopausal disorders: Secondary | ICD-10-CM | POA: Diagnosis not present

## 2019-08-16 DIAGNOSIS — N952 Postmenopausal atrophic vaginitis: Secondary | ICD-10-CM | POA: Diagnosis not present

## 2019-08-16 DIAGNOSIS — Z124 Encounter for screening for malignant neoplasm of cervix: Secondary | ICD-10-CM | POA: Diagnosis not present

## 2019-08-17 ENCOUNTER — Encounter: Payer: Self-pay | Admitting: Family Medicine

## 2019-08-17 NOTE — Telephone Encounter (Signed)
Pt sent my chart message. Pt did reach out to Dr Felecia Shelling about the possibility of having side effects to either Lipitor or Keppra.

## 2019-08-26 DIAGNOSIS — S82421A Displaced transverse fracture of shaft of right fibula, initial encounter for closed fracture: Secondary | ICD-10-CM | POA: Diagnosis not present

## 2019-08-26 DIAGNOSIS — S322XXA Fracture of coccyx, initial encounter for closed fracture: Secondary | ICD-10-CM | POA: Diagnosis not present

## 2019-08-28 DIAGNOSIS — H9312 Tinnitus, left ear: Secondary | ICD-10-CM | POA: Diagnosis not present

## 2019-08-28 DIAGNOSIS — Z9622 Myringotomy tube(s) status: Secondary | ICD-10-CM | POA: Insufficient documentation

## 2019-08-28 DIAGNOSIS — H6522 Chronic serous otitis media, left ear: Secondary | ICD-10-CM | POA: Diagnosis not present

## 2019-08-28 HISTORY — DX: Myringotomy tube(s) status: Z96.22

## 2019-09-06 ENCOUNTER — Other Ambulatory Visit (INDEPENDENT_AMBULATORY_CARE_PROVIDER_SITE_OTHER): Payer: PPO | Admitting: Family Medicine

## 2019-09-06 ENCOUNTER — Other Ambulatory Visit: Payer: Self-pay

## 2019-09-06 DIAGNOSIS — E7800 Pure hypercholesterolemia, unspecified: Secondary | ICD-10-CM

## 2019-09-06 DIAGNOSIS — Z79899 Other long term (current) drug therapy: Secondary | ICD-10-CM

## 2019-09-06 DIAGNOSIS — D72829 Elevated white blood cell count, unspecified: Secondary | ICD-10-CM | POA: Diagnosis not present

## 2019-09-06 DIAGNOSIS — S322XXA Fracture of coccyx, initial encounter for closed fracture: Secondary | ICD-10-CM | POA: Diagnosis not present

## 2019-09-06 DIAGNOSIS — S82421A Displaced transverse fracture of shaft of right fibula, initial encounter for closed fracture: Secondary | ICD-10-CM | POA: Diagnosis not present

## 2019-09-06 LAB — HEPATIC FUNCTION PANEL
ALT: 13 U/L (ref 0–35)
AST: 17 U/L (ref 0–37)
Albumin: 4.1 g/dL (ref 3.5–5.2)
Alkaline Phosphatase: 81 U/L (ref 39–117)
Bilirubin, Direct: 0.1 mg/dL (ref 0.0–0.3)
Total Bilirubin: 0.6 mg/dL (ref 0.2–1.2)
Total Protein: 7.2 g/dL (ref 6.0–8.3)

## 2019-09-06 LAB — CBC WITH DIFFERENTIAL/PLATELET
Basophils Absolute: 0.1 10*3/uL (ref 0.0–0.1)
Basophils Relative: 1.3 % (ref 0.0–3.0)
Eosinophils Absolute: 0.9 10*3/uL — ABNORMAL HIGH (ref 0.0–0.7)
Eosinophils Relative: 11.1 % — ABNORMAL HIGH (ref 0.0–5.0)
HCT: 40.4 % (ref 36.0–46.0)
Hemoglobin: 13.4 g/dL (ref 12.0–15.0)
Lymphocytes Relative: 28.5 % (ref 12.0–46.0)
Lymphs Abs: 2.3 10*3/uL (ref 0.7–4.0)
MCHC: 33.1 g/dL (ref 30.0–36.0)
MCV: 94.5 fl (ref 78.0–100.0)
Monocytes Absolute: 0.7 10*3/uL (ref 0.1–1.0)
Monocytes Relative: 8.7 % (ref 3.0–12.0)
Neutro Abs: 4 10*3/uL (ref 1.4–7.7)
Neutrophils Relative %: 50.4 % (ref 43.0–77.0)
Platelets: 268 10*3/uL (ref 150.0–400.0)
RBC: 4.28 Mil/uL (ref 3.87–5.11)
RDW: 13.9 % (ref 11.5–15.5)
WBC: 8 10*3/uL (ref 4.0–10.5)

## 2019-09-06 LAB — LIPID PANEL
Cholesterol: 243 mg/dL — ABNORMAL HIGH (ref 0–200)
HDL: 60.9 mg/dL (ref >39.00)
LDL Cholesterol: 158 mg/dL — ABNORMAL HIGH (ref 0–99)
NonHDL: 182.02
Total CHOL/HDL Ratio: 4
Triglycerides: 122 mg/dL (ref 0.0–149.0)
VLDL: 24.4 mg/dL (ref 0.0–40.0)

## 2019-09-07 ENCOUNTER — Encounter: Payer: Self-pay | Admitting: Family Medicine

## 2019-09-07 ENCOUNTER — Ambulatory Visit (INDEPENDENT_AMBULATORY_CARE_PROVIDER_SITE_OTHER): Payer: PPO | Admitting: Family Medicine

## 2019-09-07 VITALS — BP 113/69 | HR 70 | Temp 98.0°F | Resp 18 | Ht 63.0 in | Wt 184.5 lb

## 2019-09-07 DIAGNOSIS — R002 Palpitations: Secondary | ICD-10-CM | POA: Diagnosis not present

## 2019-09-07 DIAGNOSIS — R404 Transient alteration of awareness: Secondary | ICD-10-CM

## 2019-09-07 DIAGNOSIS — G2581 Restless legs syndrome: Secondary | ICD-10-CM | POA: Diagnosis not present

## 2019-09-07 DIAGNOSIS — F32A Depression, unspecified: Secondary | ICD-10-CM

## 2019-09-07 DIAGNOSIS — R569 Unspecified convulsions: Secondary | ICD-10-CM | POA: Diagnosis not present

## 2019-09-07 DIAGNOSIS — M797 Fibromyalgia: Secondary | ICD-10-CM

## 2019-09-07 DIAGNOSIS — Z79899 Other long term (current) drug therapy: Secondary | ICD-10-CM

## 2019-09-07 DIAGNOSIS — F329 Major depressive disorder, single episode, unspecified: Secondary | ICD-10-CM | POA: Diagnosis not present

## 2019-09-07 LAB — BASIC METABOLIC PANEL
BUN: 18 mg/dL (ref 6–23)
CO2: 28 mEq/L (ref 19–32)
Calcium: 9.3 mg/dL (ref 8.4–10.5)
Chloride: 101 mEq/L (ref 96–112)
Creatinine, Ser: 0.7 mg/dL (ref 0.40–1.20)
GFR: 81.73 mL/min (ref >60.00)
Glucose, Bld: 88 mg/dL (ref 70–99)
Potassium: 4.5 mEq/L (ref 3.5–5.1)
Sodium: 137 mEq/L (ref 135–145)

## 2019-09-07 NOTE — Patient Instructions (Signed)
We will call you after we complete the labs and discuss plan.
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
## 2019-09-08 DIAGNOSIS — G4733 Obstructive sleep apnea (adult) (pediatric): Secondary | ICD-10-CM | POA: Diagnosis not present

## 2019-09-11 ENCOUNTER — Telehealth: Payer: Self-pay | Admitting: Family Medicine

## 2019-09-11 ENCOUNTER — Encounter: Payer: Self-pay | Admitting: Family Medicine

## 2019-09-11 DIAGNOSIS — R569 Unspecified convulsions: Secondary | ICD-10-CM

## 2019-09-11 DIAGNOSIS — S82841D Displaced bimalleolar fracture of right lower leg, subsequent encounter for closed fracture with routine healing: Secondary | ICD-10-CM | POA: Diagnosis not present

## 2019-09-11 HISTORY — DX: Unspecified convulsions: R56.9

## 2019-09-11 LAB — LEVETIRACETAM LEVEL: Keppra (Levetiracetam): 9.3 ug/mL — ABNORMAL LOW (ref 12.0–46.0)

## 2019-09-11 LAB — CK TOTAL AND CKMB (NOT AT ARMC): Total CK: 81 U/L (ref 29–143)

## 2019-09-11 NOTE — Telephone Encounter (Signed)
Please inform patient the following information: Her liver, kidney, electrolytes and CK were normal. Her Keppra level was just mildly lower than the usual target therapy range, therefore there is no Keppra toxicity that could be causing her symptoms.   - Since it is below the therapeutic range and she voiced concern  using the medicine, we can attempt to taper off this medication.  However, this could potentially be covering  her seizures and discontinuing may cause her to have events. -I have placed a second opinion neurology consult for her, and they should be calling her to schedule this.  If she desires trial off the Keppra she should decrease her dose to 250 mg a day for 1 week and then she can discontinue.  I also would advise she discuss the methadone use with her pain specialist- this can cause dizziness, seizure like activity and heart arrhthymias and could be creating some of her issues.

## 2019-09-12 ENCOUNTER — Encounter: Payer: Self-pay | Admitting: Neurology

## 2019-09-12 NOTE — Telephone Encounter (Signed)
Pt was called again and message was left to return call  

## 2019-09-12 NOTE — Telephone Encounter (Signed)
Attempted to call patient, no answer and was unable to leave VM

## 2019-09-13 NOTE — Telephone Encounter (Signed)
Pt was called and given lab results. She verbalized understanding. Second opinion appt was made for 11/22/2019.

## 2019-09-13 NOTE — Telephone Encounter (Signed)
Pt was sent mychart message

## 2019-09-26 DIAGNOSIS — S322XXA Fracture of coccyx, initial encounter for closed fracture: Secondary | ICD-10-CM | POA: Diagnosis not present

## 2019-09-26 DIAGNOSIS — S82421A Displaced transverse fracture of shaft of right fibula, initial encounter for closed fracture: Secondary | ICD-10-CM | POA: Diagnosis not present

## 2019-10-06 DIAGNOSIS — S322XXA Fracture of coccyx, initial encounter for closed fracture: Secondary | ICD-10-CM | POA: Diagnosis not present

## 2019-10-06 DIAGNOSIS — S82421A Displaced transverse fracture of shaft of right fibula, initial encounter for closed fracture: Secondary | ICD-10-CM | POA: Diagnosis not present

## 2019-10-12 NOTE — Progress Notes (Signed)
Documentation from [employer] home visit received.  Visit completed on 09/18/2019. Updated dementia screening and falls risks assessment.

## 2019-10-13 DIAGNOSIS — G4733 Obstructive sleep apnea (adult) (pediatric): Secondary | ICD-10-CM | POA: Diagnosis not present

## 2019-10-26 DIAGNOSIS — S82421A Displaced transverse fracture of shaft of right fibula, initial encounter for closed fracture: Secondary | ICD-10-CM | POA: Diagnosis not present

## 2019-10-26 DIAGNOSIS — S322XXA Fracture of coccyx, initial encounter for closed fracture: Secondary | ICD-10-CM | POA: Diagnosis not present

## 2019-11-06 DIAGNOSIS — S82421A Displaced transverse fracture of shaft of right fibula, initial encounter for closed fracture: Secondary | ICD-10-CM | POA: Diagnosis not present

## 2019-11-06 DIAGNOSIS — S322XXA Fracture of coccyx, initial encounter for closed fracture: Secondary | ICD-10-CM | POA: Diagnosis not present

## 2019-11-16 DIAGNOSIS — G4733 Obstructive sleep apnea (adult) (pediatric): Secondary | ICD-10-CM | POA: Diagnosis not present

## 2019-11-22 ENCOUNTER — Other Ambulatory Visit: Payer: Self-pay

## 2019-11-22 ENCOUNTER — Telehealth (INDEPENDENT_AMBULATORY_CARE_PROVIDER_SITE_OTHER): Payer: PPO | Admitting: Neurology

## 2019-11-22 ENCOUNTER — Encounter: Payer: Self-pay | Admitting: Neurology

## 2019-11-22 VITALS — Ht 63.0 in | Wt 180.0 lb

## 2019-11-22 DIAGNOSIS — R42 Dizziness and giddiness: Secondary | ICD-10-CM

## 2019-11-22 DIAGNOSIS — R55 Syncope and collapse: Secondary | ICD-10-CM
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
## 2019-11-26 DIAGNOSIS — S82421A Displaced transverse fracture of shaft of right fibula, initial encounter for closed fracture: Secondary | ICD-10-CM | POA: Diagnosis not present

## 2019-11-26 DIAGNOSIS — S322XXA Fracture of coccyx, initial encounter for closed fracture: Secondary | ICD-10-CM | POA: Diagnosis not present

## 2019-11-28 ENCOUNTER — Telehealth: Payer: Self-pay | Admitting: Family Medicine

## 2019-11-28 MED ORDER — LEVOTHYROXINE SODIUM 50 MCG PO TABS: ORAL_TABLET | ORAL | 1 refills | Status: DC

## 2019-11-28 NOTE — Telephone Encounter (Signed)
Please send in levothyroxine (SYNTHROID, LEVOTHROID) 50 MCG tablet to [facility]

## 2019-11-28 NOTE — Telephone Encounter (Signed)
Refilled medication for her.

## 2019-11-28 NOTE — Addendum Note (Signed)
Addended by: Howard Pouch A on: 11/28/2019 04:20 PM   Modules accepted: Orders

## 2019-11-28 NOTE — Telephone Encounter (Signed)
RF request for Synthroid  LOV: 09/07/2019 Next ov: Not scheduled  Last written: 12/09/2018 #90 x3 refills  Thyroid labs completed 02/02/2019 by Dr Geraldo Pitter  Please advise

## 2019-11-30 ENCOUNTER — Other Ambulatory Visit: Payer: Self-pay

## 2019-11-30 ENCOUNTER — Ambulatory Visit (INDEPENDENT_AMBULATORY_CARE_PROVIDER_SITE_OTHER): Payer: PPO | Admitting: Pulmonary Disease

## 2019-11-30 ENCOUNTER — Encounter: Payer: Self-pay | Admitting: Pulmonary Disease

## 2019-11-30 VITALS — BP 132/76 | HR 75 | Temp 97.2°F | Ht 62.75 in | Wt 191.0 lb

## 2019-11-30 DIAGNOSIS — G4733 Obstructive sleep apnea (adult) (pediatric): Secondary | ICD-10-CM | POA: Diagnosis not present

## 2019-11-30 NOTE — Progress Notes (Signed)
[facility]  Chief Complaint  Patient presents with  . OSA on CPAP    Constitutional:  BP 132/76 (BP Location: Right Arm, Patient Position: Sitting, Cuff Size: Normal)   Pulse 75   Temp (!) 97.2 F (36.2 C)   Ht 5' 2.75" (1.594 m)   Wt 191 lb (86.6 kg)   SpO2 99% Comment: on room air  BMI 34.10 kg/m   Past Medical History:  RLS, Sarcoidosis, Fibromyalgia, Rheumatic fever, Hypothyroidism, HLD, Colon polyp, HA, Depression  Brief Summary:  Grace Schmitt is a 73 y.o. female with complex sleep apnea and history of restless legs on methadone therapy.  She has history of pulmonary sarcoidosis with hypercalcemia.  She was previously seen by Dr. Lake Bells.  Hasn't been on therapy for sarcoidosis for a while.  Calcium level from 09/07/19 was 9.3 mg/dl.  Gets intermittent cough with chest congestion at night.  Not having fever, sweats, skin rash, or hemoptysis.  Followed by neurology for severe RLS.  On methadone for years.  Noticed having near syncopal events over past several months.  Happen sporadically.  Concern for possible seizure.  Started on keppra.  Couldn't tolerate BID dosing and changed to daily dosing.  Has long term home EEG monitoring scheduled.  Uses CPAP nightly.  No issues with mask fit.  No having sinus congestion, sore throat, or dry mouth.   Physical Exam:   Appearance - well kempt   ENMT - clear nasal mucosa, midline nasal  septum, no oral exudates, no LAN, trachea midline  Respiratory - normal chest wall, normal respiratory effort, no accessory muscle use, no wheeze/rales  CV - s1s2 regular rate and rhythm, no murmurs, no peripheral edema, radial pulses symmetric  GI - soft, non tender, no masses  Lymph - no adenopathy noted in neck and axillary areas  MSK - normal gait  Ext - no cyanosis, clubbing, or joint inflammation noted  Skin - no rashes, lesions, or ulcers  Neuro - normal strength, oriented x 3   Psych - normal mood and affect   Assessment/Plan:   Obstructive sleep apnea. - she is compliant with CPAP and reports benefit  - continue auto CPAP 10 to 20 cm H2O  Chronic methadone therapy for severe restless leg syndrome. - it is possible she could have some degree of hypoventilation from opiate medication use - will check overnight oximetry with CPAP; then determine if she needs in lab titration study - followed by Dr. Jeoffrey Massed at [facility] neurology for RLS  Dizzy spells with possible seizure. - followed by Dr. Delice Lesch with [facility]  History of pulmonary sarcoidosis. - doesn't seem to be an active issue - monitor clinically   Patient Instructions  Will arrange for overnight oxygen test with CPAP  Follow up in 1 year  A total of  31 minutes were spent face to face with the patient and more than half of that time involved counseling or coordination of care.   Chesley Mires, MD [facility] Pager: [phone] 11/30/2019, 12:30 PM  Flow Sheet     Pulmonary tests:  ACE 08/31/17 >> 113 PPD 09/02/17 >> negative Bronchoscopy 09/20/17 >> granulomas with multinucleated giant cells; 43 WBC (7N, 41L, 80M, 1E) PFT 01/11/18 >> 1.74 (83%), FEV1% 88, TLC 3.76 (77%), DLCO 73%  Chest imaging:  CT chest 08/26/17 >> borderline LAN, extensive patchy nodularity b/l more in upper lobes, innumberable small hypodense lesions in spleen CT chest 12/12/18 >> atherosclerosis, air trapping,  sternal fx  Sleep tests:  HST 08/18/18 >> 11.6, SpO2 low 77% Auto CPAP 10/31/19 to 11/29/19 >> used on 30 of 30 nights with average 6 hrs 33 min.  Average AHI 2.5 with median CPAP 11 and 95 th percentile CPAP 14 cm H2O  Cardiac tests:  Echo 02/06/19 >> EF 55 to 60%, concentric LVH  Medications:   Allergies as of 11/30/2019      Reactions   Lipitor [atorvastatin Calcium]    Low dose caused arthralgia.   Phenergan [promethazine Hcl] Other (See Comments)   Pt has restless leg  syndrome and the phenergan cause the RLS  to worsen       Medication List       Accurate as of November 30, 2019 12:30 PM. If you have any questions, ask your nurse or doctor.        acetaminophen 325 MG tablet Commonly known as: TYLENOL Tylenol 325 -2 tablets every 6 hours, I would take this continuously until your pain is resolved.  Do not take more than 4000 mg of Tylenol per day it can harm your liver.  You can buy this over-the-counter at any drugstore.   BENEFIBER PO Take 1 Dose by mouth daily.   Biotin 5 MG Tabs Take 1 tablet by mouth daily.   co-enzyme Q-10 30 MG capsule Take 30 mg by mouth daily.   docusate sodium 100 MG capsule Commonly known as: COLACE You can buy Colace over-the-counter at any drugstore.  Follow package directions.  Your aim is to have 1 or 2 soft bowel movements per day.  You can adjust the dose as needed.   DULoxetine 30 MG capsule Commonly known as: CYMBALTA Take 60 mg by mouth daily with breakfast.   gabapentin 300 MG capsule Commonly known as: NEURONTIN Take 1 capsule by mouth daily.   IRON (FERROUS SULFATE) PO Take 1 tablet by mouth daily.   KRILL OIL PO Take 1,000 mg by mouth daily.   levETIRAcetam 250 MG tablet Commonly known as: KEPPRA Take 1 tablet (250 mg total) by mouth 2 (two) times daily. What changed: when to take this   levothyroxine 50 MCG tablet Commonly known as: SYNTHROID TAKE 1 TABLET (50 MCG TOTAL) BY MOUTH DAILY.   loratadine 10 MG tablet Commonly known as: CLARITIN Take 10 mg by mouth at bedtime.   MAGNESIUM 27 PO Take 1,300 mg by mouth daily.   methadone 5 MG tablet Commonly known as: DOLOPHINE Take 7.5 mg by mouth daily at 6 PM. For restless legs   multivitamin with minerals Tabs tablet Take 1 tablet by mouth 2 (two) times daily.   vitamin C 500 MG tablet Commonly known as: ASCORBIC ACID Take 500 mg by mouth daily.   zinc gluconate 50 MG tablet Take 50 mg by mouth daily.       Past Surgical  History:  She  has a past surgical history that includes Tonsillectomy (1951); Tubal ligation (1978); Colonoscopy (October 2011); Endobronchial ultrasound (Bilateral, 09/20/2017); and ORIF ankle fracture (Right, 12/15/2018).  Family History:  Her family history includes Cancer in her brother and father; Dementia in her mother; Heart attack in her paternal grandfather; Heart disease in her brother, father, and mother; Hyperlipidemia in her brother, father, and mother; Hypertension in her brother and father; Kidney disease in her father.  Social History:  She  reports that she has never smoked. She has never used smokeless tobacco. She reports that she does not drink alcohol or use drugs.

## 2019-11-30 NOTE — Patient Instructions (Signed)
Will arrange for overnight oxygen test with CPAP  Follow up in 1 year 

## 2019-12-01 ENCOUNTER — Ambulatory Visit (INDEPENDENT_AMBULATORY_CARE_PROVIDER_SITE_OTHER): Payer: PPO | Admitting: Neurology

## 2019-12-01 DIAGNOSIS — R42 Dizziness and giddiness: Secondary | ICD-10-CM | POA: Diagnosis not present

## 2019-12-01 DIAGNOSIS — R55 Syncope and collapse: Secondary | ICD-10-CM

## 2019-12-02 ENCOUNTER — Encounter: Payer: Self-pay | Admitting: Pulmonary Disease

## 2019-12-02 DIAGNOSIS — J449 Chronic obstructive pulmonary disease, unspecified: Secondary | ICD-10-CM | POA: Diagnosis not present

## 2019-12-02 DIAGNOSIS — R0902 Hypoxemia: Secondary | ICD-10-CM | POA: Diagnosis not present

## 2019-12-08 ENCOUNTER — Telehealth: Payer: Self-pay | Admitting: Pulmonary Disease

## 2019-12-08 DIAGNOSIS — G4733 Obstructive sleep apnea (adult) (pediatric): Secondary | ICD-10-CM

## 2019-12-08 NOTE — Telephone Encounter (Signed)
Called the patient and made her aware of the results noted below. The patient voiced understanding. CPAP titration study ordered with patient in agreement. Nothing further needed at this time.

## 2019-12-08 NOTE — Telephone Encounter (Signed)
ONO with CPAP 12/02/19 >> test time 16 hrs 1 min.  Baseline SpO2 91%, low SpO2 79%.  Spent 1 hrs 26 min with SpO2 < 88%.   Please let her know her oxygen level is low even with use of CPAP.  She needs to have an in lab CPAP titration study to determine if she needs adjustment to her CPAP set up, or if she needs to use supplemental oxygen at night with CPAP.  Please schedule CPAP titration study if she is agreeable.  Otherwise, she can schedule an ROV to discuss in more detail if that is her preference first.

## 2019-12-13 ENCOUNTER — Telehealth: Payer: Self-pay | Admitting: Neurology

## 2019-12-13 MED ORDER — TOPIRAMATE 25 MG PO TABS: ORAL_TABLET | ORAL | 3 refills | Status: DC

## 2019-12-13 NOTE — Telephone Encounter (Signed)
Spoke with patient, discussed EEG results. No epileptiform discharges seen. There was occasional focal delta slowing over the left temporal region. Typical events were not captured. She reports that this weekend she had 3 that woke her up from sleep then another the next day. She felt out of sorts for a few days after, panicky, asking her husband to sit with her, which is not her usual self. Discussed the possibility still of seizures and therapeutic trial with a different AED, Topiramate, that can also help with headaches. Side effects discussed. Start Topiramate 25mg  BID x 1 week, then increase to 2 tabs BID. Continue Keppra 250mg  daily until she in on the higher dose of TPX, then stop Keppra. She is scheduled for a sleep study on 12/30. F/u with me in January, she knows to call for any changes.

## 2019-12-25 ENCOUNTER — Other Ambulatory Visit (HOSPITAL_COMMUNITY)
Admission: RE | Admit: 2019-12-25 | Discharge: 2019-12-25 | Disposition: A | Discharge status: Home / Self Care | Payer: PPO | Source: Ambulatory Visit | Attending: Pulmonary Disease | Admitting: Pulmonary Disease

## 2019-12-25 DIAGNOSIS — Z20828 Contact with and (suspected) exposure to other viral communicable diseases: Secondary | ICD-10-CM | POA: Insufficient documentation

## 2019-12-25 DIAGNOSIS — Z01812 Encounter for preprocedural laboratory examination: Secondary | ICD-10-CM | POA: Insufficient documentation

## 2019-12-25 LAB — SARS CORONAVIRUS 2 (TAT 6-24 HRS): SARS Coronavirus 2: NEGATIVE

## 2019-12-25 NOTE — Procedures (Signed)
ELECTROENCEPHALOGRAM REPORT  Dates of Recording: 12/01/2019 8:03AM to 12/04/2019 8:44AM  Patient's Name: Grace Schmitt MRN: [id number] Date of Birth: [birth year]  Referring Provider: Dr. Ellouise Newer  Procedure: 69-hour ambulatory video EEG  History: This is a 73 year old woman with recurrent episodes where she feels like she would pass out, with diaphoresis and urinary incontinence. She would typically have a headache after, then feel off for 1-2 days. No loss of consciousness  Medications:  Keppra Neurontin Cymbalta  Technical Summary: This is a 69-hour multichannel digital video EEG recording measured by the international 10-20 system with electrodes applied with paste and impedances below 5000 ohms performed as portable with EKG monitoring.  The digital EEG was referentially recorded, reformatted, and digitally filtered in a variety of bipolar and referential montages for optimal display.    DESCRIPTION OF RECORDING: During maximal wakefulness, the background activity consisted of a symmetric 9 Hz posterior dominant rhythm which was reactive to eye opening.  There was occasional focal 2-3 Hz delta slowing seen over the left temporal region, at times occurring in runs lasting up to 6 seconds without clinical correlate. There were no epileptiform discharges seen in wakefulness.  During the recording, the patient progresses through wakefulness, drowsiness, and Stage 2 sleep.  Similar focal slowing was seen over the left temporal region, at times sharply contoured without clear epileptogenic potential. There were no clear epileptiform discharges seen.  Events: On 12/4 at 955 hours, she has a headache. Electrographically, there were no EEG or EKG changes seen.  On 12/5 at 1140 hours, she still has a slight headache. Electrographically, there were no EEG or EKG changes seen.  On 12/6 at 0730 hours, she feels nauseated, restless leg bothering her. Electrographically, there were no EEG or  EKG changes seen.  There were no electrographic seizures seen.  EKG lead was unremarkable.  IMPRESSION: This 69-hour ambulatory video EEG study is abnormal due to occasional focal delta slowing over the left temporal region, at times in runs lasting 6 seconds without clinical correlate.  CLINICAL CORRELATION of the above findings indicates focal cerebral dysfunction over the left temporal region suggestive of underlying structural or physiologic abnormality. The absence of epileptiform discharges does not exclude a clinical diagnosis of epilepsy. Typical events were not captured.  If further clinical questions remain, inpatient video EEG monitoring may be helpful.   Ellouise Newer, M.D.

## 2019-12-27 ENCOUNTER — Ambulatory Visit (HOSPITAL_BASED_OUTPATIENT_CLINIC_OR_DEPARTMENT_OTHER): Payer: PPO | Attending: Pulmonary Disease | Admitting: Pulmonary Disease

## 2019-12-27 ENCOUNTER — Other Ambulatory Visit: Payer: Self-pay

## 2019-12-27 VITALS — Ht 63.0 in | Wt 185.0 lb

## 2019-12-27 DIAGNOSIS — G2581 Restless legs syndrome: Secondary | ICD-10-CM | POA: Insufficient documentation

## 2019-12-27 DIAGNOSIS — G4733 Obstructive sleep apnea (adult) (pediatric): Secondary | ICD-10-CM | POA: Diagnosis not present

## 2019-12-27 DIAGNOSIS — D860 Sarcoidosis of lung: Secondary | ICD-10-CM | POA: Diagnosis not present

## 2020-01-01 ENCOUNTER — Telehealth: Payer: Self-pay | Admitting: Pulmonary Disease

## 2020-01-01 DIAGNOSIS — G4733 Obstructive sleep apnea (adult) (pediatric): Secondary | ICD-10-CM

## 2020-01-01 NOTE — Telephone Encounter (Signed)
CPAP titration 12/27/19 >> CPAP 6, didn't need supplemental oxygen.   Please let her know she did well during the titration study once she was on pressure of 6 cm H2O and didn't need supplemental oxygen.  Please send order to have her CPAP setting changed to 6 cm H2O.

## 2020-01-01 NOTE — Procedures (Signed)
    Patient Name: Grace Schmitt, Grace Schmitt Date: 12/27/2019 Gender: Female D.O.B: Sep 27, 1946 Age (years): 66 Referring Provider: Chesley Mires MD, ABSM Height (inches): 72 Interpreting Physician: Chesley Mires MD, ABSM Weight (lbs): 185 RPSGT: Zadie Rhine BMI: 61 MRN: [id number] Neck Size: 15.00  CLINICAL INFORMATION 74 year old female with complex sleep apnea, severe restless leg syndrome on chronic methadone therapy, and pulmonary sarcoidosis recently found to have oxygen desaturation overnight while using CPAP.  She is referred for a CPAP titration study.  Date of HST: 08/18/18, AHI 11.6 and SpO2 low 77%  SLEEP STUDY TECHNIQUE As per the AASM Manual for the Scoring of Sleep and Associated Events v2.3 (April 2016) with a hypopnea requiring 4% desaturations.  The channels recorded and monitored were frontal, central and occipital EEG, electrooculogram (EOG), submentalis EMG (chin), nasal and oral airflow, thoracic and abdominal wall motion, anterior tibialis EMG, snore microphone, electrocardiogram, and pulse oximetry. Continuous positive airway pressure (CPAP) was initiated at the beginning of the study and titrated to treat sleep-disordered breathing.  MEDICATIONS Medications self-administered by patient taken the night of the study : N/A  TECHNICIAN COMMENTS Comments added by technician: [place]. Patient had difficulty initiating sleep. Comments added by scorer: N/A  RESPIRATORY PARAMETERS Optimal PAP Pressure (cm): 6 AHI at Optimal Pressure (/hr): 0.5 Overall Minimal O2 (%): 89.0 Supine % at Optimal Pressure (%): 100 Minimal O2 at Optimal Pressure (%): 89.0   SLEEP ARCHITECTURE The study was initiated at 10:07:59 PM and ended at 4:56:51 AM.  Sleep onset time was 42.1 minutes and the sleep efficiency was 82.9%%. The total sleep time was 339 minutes.  The patient spent 2.4%% of the night in stage N1 sleep, 70.2%% in stage N2 sleep, 18.9%% in stage N3 and 8.6%  in REM.Stage REM latency was 139.5 minutes  Wake after sleep onset was 27.7. Alpha intrusion was absent. Supine sleep was 100.00%.  CARDIAC DATA The 2 lead EKG demonstrated sinus rhythm. The mean heart rate was 68.0 beats per minute. Other EKG findings include: None.  LEG MOVEMENT DATA The total Periodic Limb Movements of Sleep (PLMS) were 0. The PLMS index was 0.0. A PLMS index of <15 is considered normal in adults.  IMPRESSIONS - The optimal PAP pressure was 6 cm of water. - She did not require the use of supplemental oxygen during this study.  DIAGNOSIS - Obstructive Sleep Apnea (327.23 [G47.33 ICD-10])  RECOMMENDATIONS - Trial of CPAP therapy on 6 cm H2O or continue with auto CPAP. - She was fitted with a Medium size Resmed Full Face Mask AirFit F30 mask and heated humidification.  [Electronically signed] 01/01/2020 09:04 AM  Chesley Mires MD, ABSM Diplomate, American Board of Sleep Medicine   NPI: [id number]

## 2020-01-01 NOTE — Telephone Encounter (Signed)
Called the patient and made her aware of the results and that settings will be adjusted.Patient voiced understanding. Order placed for setting adjustment.  The patient stated that she received a message from [facility] telling her she was a no show for the sleep study and would be charged $200.00. Patient stated she did not understand why she received that notification when she had the study (based on there results received from Dr. Halford Chessman).  Message forwarded to [place] [facility] to review and confirm the patient has not been charge a no show fee.

## 2020-01-04 DIAGNOSIS — H9312 Tinnitus, left ear: Secondary | ICD-10-CM | POA: Diagnosis not present

## 2020-01-04 DIAGNOSIS — Z9622 Myringotomy tube(s) status: Secondary | ICD-10-CM | POA: Diagnosis not present

## 2020-01-09 DIAGNOSIS — H9312 Tinnitus, left ear: Secondary | ICD-10-CM | POA: Diagnosis not present

## 2020-01-09 DIAGNOSIS — H9042 Sensorineural hearing loss, unilateral, left ear, with unrestricted hearing on the contralateral side: Secondary | ICD-10-CM | POA: Diagnosis not present

## 2020-01-15 ENCOUNTER — Encounter: Payer: Self-pay | Admitting: Neurology

## 2020-01-15 ENCOUNTER — Telehealth (INDEPENDENT_AMBULATORY_CARE_PROVIDER_SITE_OTHER): Payer: PPO | Admitting: Neurology

## 2020-01-15 ENCOUNTER — Other Ambulatory Visit: Payer: Self-pay

## 2020-01-15 VITALS — Ht 63.0 in | Wt 190.0 lb

## 2020-01-15 DIAGNOSIS — R404 Transient alteration of awareness: Secondary | ICD-10-CM

## 2020-01-15 DIAGNOSIS — R55 Syncope and collapse: Secondary | ICD-10-CM

## 2020-01-15 MED ORDER — TOPIRAMATE 25 MG PO TABS: ORAL_TABLET | ORAL | 6 refills | Status: DC
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
## 2020-01-16 DIAGNOSIS — M542 Cervicalgia: Secondary | ICD-10-CM | POA: Diagnosis not present

## 2020-01-16 DIAGNOSIS — M546 Pain in thoracic spine: Secondary | ICD-10-CM | POA: Diagnosis not present

## 2020-01-17 DIAGNOSIS — M76821 Posterior tibial tendinitis, right leg: Secondary | ICD-10-CM | POA: Diagnosis not present

## 2020-01-20 DIAGNOSIS — Z20822 Contact with and (suspected) exposure to covid-19: Secondary | ICD-10-CM | POA: Diagnosis not present

## 2020-01-24 ENCOUNTER — Other Ambulatory Visit: Payer: Self-pay | Admitting: Orthopedic Surgery

## 2020-01-24 DIAGNOSIS — N23 Unspecified renal colic: Secondary | ICD-10-CM

## 2020-02-02 ENCOUNTER — Ambulatory Visit
Admission: RE | Admit: 2020-02-02 | Discharge: 2020-02-02 | Disposition: A | Discharge status: Home / Self Care | Payer: PPO | Source: Ambulatory Visit | Attending: Orthopedic Surgery | Admitting: Orthopedic Surgery

## 2020-02-02 DIAGNOSIS — R109 Unspecified abdominal pain: Secondary | ICD-10-CM | POA: Diagnosis not present

## 2020-02-02 DIAGNOSIS — N23 Unspecified renal colic: Secondary | ICD-10-CM

## 2020-02-02 DIAGNOSIS — S3991XA Unspecified injury of abdomen, initial encounter: Secondary | ICD-10-CM | POA: Diagnosis not present

## 2020-02-02 MED ORDER — IOPAMIDOL (ISOVUE-300) INJECTION 61%
100.0000 mL | Freq: Once | INTRAVENOUS | Status: AC | PRN
  Administered 2020-02-02: 100 mL via INTRAVENOUS

## 2020-02-14 DIAGNOSIS — M76821 Posterior tibial tendinitis, right leg: Secondary | ICD-10-CM | POA: Diagnosis not present

## 2020-02-16 DIAGNOSIS — G4733 Obstructive sleep apnea (adult) (pediatric): Secondary | ICD-10-CM | POA: Diagnosis not present

## 2020-02-19 ENCOUNTER — Other Ambulatory Visit: Payer: Self-pay | Admitting: Cardiology

## 2020-02-19 DIAGNOSIS — R42 Dizziness and giddiness: Secondary | ICD-10-CM

## 2020-02-19 DIAGNOSIS — K048 Radicular cyst: Secondary | ICD-10-CM | POA: Diagnosis not present

## 2020-02-19 DIAGNOSIS — R002 Palpitations: Secondary | ICD-10-CM

## 2020-03-11 DIAGNOSIS — H40013 Open angle with borderline findings, low risk, bilateral: Secondary | ICD-10-CM | POA: Diagnosis not present

## 2020-04-08 ENCOUNTER — Telehealth: Payer: Self-pay | Admitting: Neurology

## 2020-04-08 NOTE — Telephone Encounter (Signed)
Pls confirm how she is taking the Topamax, is she on 50mg  in AM, 75mg  in PM? If not, recommend reduce to this dose. If she is taking this dose, then pls have her reduce to 50mg  BID. Thanks

## 2020-04-08 NOTE — Telephone Encounter (Signed)
Pt stated that she will try the decrease in medication and will see how she can tolerate it

## 2020-04-08 NOTE — Telephone Encounter (Signed)
Pt states that she thinks she is having a reaction from her medication she feels like something is crawling all of her. She is also having pin and needles please call

## 2020-04-12 ENCOUNTER — Other Ambulatory Visit: Payer: Self-pay

## 2020-04-12 ENCOUNTER — Emergency Department (HOSPITAL_COMMUNITY): Payer: PPO

## 2020-04-12 ENCOUNTER — Emergency Department (HOSPITAL_BASED_OUTPATIENT_CLINIC_OR_DEPARTMENT_OTHER)
Admission: EM | Admit: 2020-04-12 | Discharge: 2020-04-12 | Disposition: A | Discharge status: Home / Self Care | Payer: PPO | Attending: Emergency Medicine | Admitting: Emergency Medicine

## 2020-04-12 ENCOUNTER — Emergency Department (HOSPITAL_BASED_OUTPATIENT_CLINIC_OR_DEPARTMENT_OTHER): Payer: PPO

## 2020-04-12 DIAGNOSIS — R42 Dizziness and giddiness: Secondary | ICD-10-CM

## 2020-04-12 DIAGNOSIS — Z79899 Other long term (current) drug therapy: Secondary | ICD-10-CM | POA: Insufficient documentation

## 2020-04-12 DIAGNOSIS — E039 Hypothyroidism, unspecified: Secondary | ICD-10-CM | POA: Diagnosis not present

## 2020-04-12 DIAGNOSIS — R55 Syncope and collapse: Secondary | ICD-10-CM | POA: Insufficient documentation

## 2020-04-12 DIAGNOSIS — S0990XA Unspecified injury of head, initial encounter: Secondary | ICD-10-CM | POA: Diagnosis not present

## 2020-04-12 DIAGNOSIS — R519 Headache, unspecified: Secondary | ICD-10-CM | POA: Diagnosis not present

## 2020-04-12 LAB — CBC
HCT: 44.2 % (ref 36.0–46.0)
Hemoglobin: 14.2 g/dL (ref 12.0–15.0)
MCH: 31.1 pg (ref 26.0–34.0)
MCHC: 32.1 g/dL (ref 30.0–36.0)
MCV: 96.7 fL (ref 80.0–100.0)
Platelets: 301 10*3/uL (ref 150–400)
RBC: 4.57 MIL/uL (ref 3.87–5.11)
RDW: 13.2 % (ref 11.5–15.5)
WBC: 8.7 10*3/uL (ref 4.0–10.5)
nRBC: 0 % (ref 0.0–0.2)

## 2020-04-12 LAB — URINALYSIS, ROUTINE W REFLEX MICROSCOPIC
Bilirubin Urine: NEGATIVE
Glucose, UA: NEGATIVE mg/dL
Hgb urine dipstick: NEGATIVE
Ketones, ur: NEGATIVE mg/dL
Leukocytes,Ua: NEGATIVE
Nitrite: NEGATIVE
Protein, ur: NEGATIVE mg/dL
Specific Gravity, Urine: 1.01 (ref 1.005–1.030)
pH: 7.5 (ref 5.0–8.0)

## 2020-04-12 LAB — BASIC METABOLIC PANEL
Anion gap: 10 (ref 5–15)
BUN: 18 mg/dL (ref 8–23)
CO2: 24 mmol/L (ref 22–32)
Calcium: 9.2 mg/dL (ref 8.9–10.3)
Chloride: 103 mmol/L (ref 98–111)
Creatinine, Ser: 0.65 mg/dL (ref 0.44–1.00)
GFR calc Af Amer: >60 mL/min (ref >60)
GFR calc non Af Amer: >60 mL/min (ref >60)
Glucose, Bld: 104 mg/dL — ABNORMAL HIGH (ref 70–99)
Potassium: 4.1 mmol/L (ref 3.5–5.1)
Sodium: 137 mmol/L (ref 135–145)

## 2020-04-12 NOTE — ED Triage Notes (Signed)
Pt states she was walking to her bedroom and became dizzy.  Pt states she had LOC at that time.  Pt still feels woozy.  Pt states she was lying down afterwards and noticed the room was spinning.  Pt states her head doesn't feel right.  Has had fluid in left ear in past.  Pt states she has intermittent pressure on left side of head for weeks, with occasional sharp pain in last days.  Marland Kitchen
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
## 2020-04-12 NOTE — ED Provider Notes (Signed)
The patient appears reasonably screened and/or stabilized for discharge and I doubt any other medical condition or other [facility] requiring further screening, evaluation, or treatment in the ED at this time prior to discharge.   Results from the ER workup discussed with the patient face to face and all questions answered to the best of my ability. The patient is safe for discharge with strict return precautions.  Patient already has an ENT and neurologist that she sees.  She will follow-up with ENT doctor as she thinks that there could be an inner ear ear problem right now.  She has been advised to follow-up with her PCP as well for overall evaluation.   Varney Biles, MD 04/12/20 [phone]

## 2020-04-12 NOTE — Discharge Instructions (Addendum)
Please contact your neurologist and your ENT doctor for further work-up.  The ER work-up which included MRI of the brain did not show any evidence of acute findings like a stroke or severe electrolyte abnormality. It is prudent to follow-up with your primary care doctor for overall checkup. Please see the ENT doctors as well to see if this is an inner ear issue.

## 2020-04-15 ENCOUNTER — Telehealth: Payer: Self-pay

## 2020-04-15 NOTE — Telephone Encounter (Signed)
Pt went to ED for evaluation per Epic notes  [place] [facility] Patient Name: Grace Schmitt Gender: Female DOB: 02/04/1946 Age: 74 Y 16 M Return Phone Number: [phone] (Primary), [phone] (Secondary) Address: City/State/Zip: [place] [address] Client [facility] - Client Client Site [place] - Day Physician Raoul Pitch, [place] Contact Type Call Who Is Calling Patient / Member / Family / Caregiver Call Type Triage / Clinical Relationship To Patient Self Return Phone Number [phone] (Primary) Chief Complaint Dizziness Reason for Call Symptomatic / Request for [place] states yesterday fainted and hit the floor face first walking from bedroom to the kitchen. Afterwards unsteady all day, even lying down the room was moving. Still feeling unsteady. [place] Not Listed [facility] ER. Translation No Nurse Assessment Nurse: Sherrell Puller, RN, Amy Date/Time Eilene Ghazi Time): 04/12/2020 8:33:47 AM Confirm and document reason for call. If symptomatic, describe symptoms. ---Caller states she fainted and hit the floor face first on the carpet. Was only out for a few seconds. Afterwards was unsteady all day and even when she was lying down the room was moving. Still feels unsteady today. Says she keeps walking to the right. No injuries to the face or head. Has the patient had close contact with a person known or suspected to have the novel coronavirus illness OR traveled / lives in area with major community spread (including international travel) in the last 14 days from the onset of symptoms? * If Asymptomatic, screen for exposure and travel within the last 14 days. ---No Does the patient have any new or worsening symptoms? ---Yes Will a triage be completed? ---Yes Related visit to physician within the last 2 weeks?  ---No Does the PT have any chronic conditions? (i.e. diabetes, asthma, this includes High risk factors for pregnancy, etc.) ---Yes List chronic conditions. ---Partial seizures Is this a behavioral health or substance abuse call? ---NoPLEASE NOTE: All timestamps contained within this report are represented as [place] Standard Time. CONFIDENTIALTY NOTICE: This fax transmission is intended only for the addressee. It contains information that is legally privileged, confidential or otherwise protected from use or disclosure. If you are not the intended recipient, you are strictly prohibited from reviewing, disclosing, copying using or disseminating any of this information or taking any action in reliance on or regarding this information. If you have received this fax in error, please notify us immediately by telephone so that we can arrange for its return to [place]. Phone: [phone], Toll-Free: [phone], Fax: [phone] Page: 2 of 2 Call Id: [id number] Guidelines Guideline Title Affirmed Question Affirmed Notes Nurse Date/Time Eilene Ghazi Time) Fainting [1] Fainted > 15 minutes ago AND [2] still feels weak or dizzy Sherrell Puller, RN, Amy 04/12/2020 8:38:03 AM Disp. Time Eilene Ghazi Time) Disposition Final User 04/12/2020 8:43:17 AM Go to ED Now Yes Sherrell Puller, RN, Amy Caller Disagree/Comply Comply Caller Understands Yes PreDisposition Go to ED Care Advice Given Per Guideline GO TO ED NOW: * You need to be seen in the Emergency Department. * Go to the ED at ___________ [place] now. Drive carefully. NOTE TO TRIAGER - DRIVING: * Another adult should drive. BRING MEDICINES: * Please bring a list of your current medicines when you go to the Emergency Department (ER). * It is also a good idea to bring the pill bottles too. This will help the doctor to make certain you are taking the right medicines and  the right dose. CALL EMS 911 IF: * Difficulty breathing or confusion occurs * Faints again * Too dizzy  or weak to stand * You become worse. CARE ADVICE given per Fainting (Adult) guideline. Referrals GO TO FACILITY OTHER - SPECIFY

## 2020-04-16 ENCOUNTER — Encounter: Payer: Self-pay | Admitting: Family Medicine

## 2020-04-16 ENCOUNTER — Ambulatory Visit (INDEPENDENT_AMBULATORY_CARE_PROVIDER_SITE_OTHER): Payer: PPO | Admitting: Family Medicine

## 2020-04-16 ENCOUNTER — Other Ambulatory Visit: Payer: Self-pay

## 2020-04-16 VITALS — BP 123/77 | HR 78 | Temp 98.6°F | Resp 18 | Ht 63.0 in | Wt 184.5 lb

## 2020-04-16 DIAGNOSIS — J329 Chronic sinusitis, unspecified: Secondary | ICD-10-CM | POA: Diagnosis not present

## 2020-04-16 DIAGNOSIS — H9202 Otalgia, left ear: Secondary | ICD-10-CM

## 2020-04-16 DIAGNOSIS — R55 Syncope and collapse: Secondary | ICD-10-CM

## 2020-04-16 DIAGNOSIS — B9689 Other specified bacterial agents as the cause of diseases classified elsewhere: Secondary | ICD-10-CM

## 2020-04-16 MED ORDER — AMOXICILLIN-POT CLAVULANATE 875-125 MG PO TABS: 1.0000 | ORAL_TABLET | Freq: Two times a day (BID) | ORAL | 0 refills | Status: DC
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
## 2020-04-16 NOTE — Progress Notes (Signed)
Pre visit review using our clinic review tool, if applicable. No additional management support is needed unless otherwise documented below in the visit note. 

## 2020-04-16 NOTE — Patient Instructions (Addendum)
Your pain seems to be over your mastoid area. Start antibiotic  As scheduled. This may or may not help your ear. Follow up with ENT and Neuro end of the month.   I do have concerns your event was a seizure since it occurred after she lowered your dose. Please discuss medication options with your neurologist.

## 2020-04-18 ENCOUNTER — Encounter: Payer: Self-pay | Admitting: Family Medicine

## 2020-04-19 ENCOUNTER — Other Ambulatory Visit: Payer: Self-pay | Admitting: Neurology

## 2020-04-22 DIAGNOSIS — H40013 Open angle with borderline findings, low risk, bilateral: Secondary | ICD-10-CM | POA: Diagnosis not present

## 2020-04-22 DIAGNOSIS — H353230 Exudative age-related macular degeneration, bilateral, stage unspecified: Secondary | ICD-10-CM | POA: Diagnosis not present

## 2020-04-25 ENCOUNTER — Ambulatory Visit: Payer: PPO | Admitting: Neurology

## 2020-04-25 ENCOUNTER — Encounter: Payer: Self-pay | Admitting: Neurology

## 2020-04-25 ENCOUNTER — Other Ambulatory Visit: Payer: Self-pay

## 2020-04-25 VITALS — BP 139/82 | HR 75 | Resp 20 | Ht 63.0 in | Wt 187.0 lb

## 2020-04-25 DIAGNOSIS — R404 Transient alteration of awareness: Secondary | ICD-10-CM

## 2020-04-25 DIAGNOSIS — R42 Dizziness and giddiness: Secondary | ICD-10-CM

## 2020-04-25 DIAGNOSIS — R55 Syncope and collapse: Secondary | ICD-10-CM

## 2020-04-25 DIAGNOSIS — H6522 Chronic serous otitis media, left ear: Secondary | ICD-10-CM | POA: Diagnosis not present

## 2020-04-25 HISTORY — DX: Dizziness and giddiness: R42

## 2020-04-25 MED ORDER — OXTELLAR XR 300 MG PO TB24: 300.0000 mg | ORAL_TABLET | Freq: Every day | ORAL | 0 refills | Status: DC

## 2020-04-25 MED ORDER — OXTELLAR XR 150 MG PO TB24: 150.0000 mg | ORAL_TABLET | Freq: Every day | ORAL | 0 refills | Status: DC

## 2020-04-25 MED ORDER — OXTELLAR XR 600 MG PO TB24: 600.0000 mg | ORAL_TABLET | Freq: Every day | ORAL | 0 refills | Status: DC

## 2020-04-25 NOTE — Addendum Note (Signed)
Addended by: Jake Seats on: 04/25/2020 11:05 AM   Modules accepted: Orders

## 2020-04-25 NOTE — Addendum Note (Signed)
Addended by: Cameron Sprang on: 04/25/2020 10:43 AM   Modules accepted: Orders
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
## 2020-04-30 DIAGNOSIS — H25043 Posterior subcapsular polar age-related cataract, bilateral: Secondary | ICD-10-CM | POA: Diagnosis not present

## 2020-04-30 DIAGNOSIS — H18413 Arcus senilis, bilateral: Secondary | ICD-10-CM | POA: Diagnosis not present

## 2020-04-30 DIAGNOSIS — H2512 Age-related nuclear cataract, left eye: Secondary | ICD-10-CM | POA: Diagnosis not present

## 2020-04-30 DIAGNOSIS — H25013 Cortical age-related cataract, bilateral: Secondary | ICD-10-CM | POA: Diagnosis not present

## 2020-04-30 DIAGNOSIS — H2513 Age-related nuclear cataract, bilateral: Secondary | ICD-10-CM | POA: Diagnosis not present

## 2020-05-04 ENCOUNTER — Encounter: Payer: Self-pay | Admitting: Neurology

## 2020-05-04 NOTE — Progress Notes (Signed)
This morning, I received a call from the answering service and personally spoke to Grace Schmitt. She is a patient of my colleague, Dr. Delice Lesch.  She was being transitioned from topiramate 75mg  twice daily to Oxtellar XR 600 mg daily because she was experiencing side effects on topiramate, primarily paresthesias.  However, once she increased dose of Oxtellar XR from 150mg  daily to 300mg  daily, she started experiencing increased pain ( she  had fibromyalgia) and restless leg symptoms.  Today is her last day of Oxtellar XR 300mg  and is supposed to start the 600 mg pill tomorrow.  She takes it at bedtime.  She is currently taking topiramate 25mg  twice daily with instruction to stop tomorrow (when she starts Oxtellar XR 600mg ).  I recommended going back down to 150mg  Oxtellar XR daily and increase topiramate back up to 50mg  twice daily and to contact the office on Monday with  further instructions from Dr. Delice Lesch.  However, she does not have any more 300mg  pills after today because she was given samples.  I can call in enough 300mg  pills to her pharmacy, but it will likely be expensive without prior authorization.  Therefore, I have called in oxcarbazepine IR 150mg  tablets, 1/2 tablet twice daily (150mg  total daily dose) to [facility] pharmacy, [facility]. She will increase topiramate back up to 50mg  twic e daily (she has enough through the weekend) and will contact the office Monday morning.  She expressed understanding of the instructions.

## 2020-05-05 ENCOUNTER — Other Ambulatory Visit: Payer: Self-pay | Admitting: Neurology

## 2020-05-06 ENCOUNTER — Telehealth: Payer: Self-pay | Admitting: Neurology

## 2020-05-06 ENCOUNTER — Other Ambulatory Visit: Payer: Self-pay

## 2020-05-06 DIAGNOSIS — R569 Unspecified convulsions: Secondary | ICD-10-CM

## 2020-05-06 DIAGNOSIS — R404 Transient alteration of awareness: Secondary | ICD-10-CM

## 2020-05-06 DIAGNOSIS — R55 Syncope and collapse: Secondary | ICD-10-CM

## 2020-05-06 MED ORDER — TOPIRAMATE 50 MG PO TABS: 50.0000 mg | ORAL_TABLET | Freq: Two times a day (BID) | ORAL | 0 refills | Status: DC

## 2020-05-06 NOTE — Telephone Encounter (Signed)
confirm what she is taking now, Dr. Tomi Likens instructed her over the weekend to increase Topamax back to 50mg  twice a day. Is she on the oxcarbazepine 150mg  1/2 tab twice a day? If yes, she can stop the oxcarbazepine since this appears to be causing her pain, stay on Topamax 50mg  BID.  Pt advised to stop the oxcarbazepine 150mg  per Dr Delice Lesch pt verbalized understanding,  New script for her Topamax 50 mg BID was called in to pharmacy on file.  We had discussed inpatient video EEG monitoring, at this point Dr Delice Lesch  would proceed with this before starting a different medication. Pt agreed to this, inpatient EEG was ordered to be done at [facility] [place] they will call pt to get it scheduled.

## 2020-05-06 NOTE — Telephone Encounter (Signed)
Patient states she had a reaction to Oxtellar 300mg , was having bad pain, she had a small seizure 5/8 and had 4 small seizures 5/9. She spoke with [facility] on 5/8:  "Caller states she is being treated for partial seizures and her medication was changed recently from Topamax to Oxtellar XR. Caller said she is having sever side effects and she is unsure what to do right now. Stated she is having severe pain, trouble sleeping, joint pain, muscle pain, and aggravates her restless leg syndrome. Caller said she is currently taking 300mg  and is supposed to be increasing to 600mg . Wants to know what she can do to control her pain."  Call was transferred to on call provider, Tomi Likens. Patient was instructed to take generic Oxcarbazepine ER 150mg . Patient states she was only given enough for the weekend and was instructed to call back to inform Dr. Delice Lesch of reaction and request refill. Please call.

## 2020-05-06 NOTE — Telephone Encounter (Signed)
Pls confirm what she is taking now, Dr. Tomi Likens instructed her over the weekend to increase Topamax back to 50mg  twice a day. Is she on the oxcarbazepine 150mg  1/2 tab twice a day? If yes, she can stop the oxcarbazepine since this appears to be causing her pain, stay on Topamax 50mg  BID. We had discussed inpatient video EEG monitoring, at this point I would proceed with this before starting a different medication, stay on Topamax 50mg  BID for now. If she is agreeable, pls send order for inpatient EEG, thanks

## 2020-05-06 NOTE — Telephone Encounter (Signed)
confirm what she is taking now, Dr. Tomi Likens instructed her over the weekend to increase Topamax back to 50mg  twice a day. Is she on the oxcarbazepine 150mg  1/2 tab twice a day? If yes, she can stop the oxcarbazepine since this appears to be causing her pain, stay on Topamax 50mg  BID. Pt advised to stop the oxcarbazepine 150mg  per Dr Delice Lesch pt verbalized understanding, New script for her Topamax 50 mg BID was called in to pharmacy on file.  We had discussed inpatient video EEG monitoring, at this point Dr Delice Lesch would proceed with this before starting a different medication. Pt agreed to this, inpatient EEG was ordered to be done at [facility] they will call pt to get it scheduled.

## 2020-05-07 ENCOUNTER — Other Ambulatory Visit: Payer: Self-pay

## 2020-05-08 ENCOUNTER — Other Ambulatory Visit: Payer: Self-pay

## 2020-05-08 DIAGNOSIS — R569 Unspecified convulsions: Secondary | ICD-10-CM

## 2020-05-08 DIAGNOSIS — R55 Syncope and collapse: Secondary | ICD-10-CM

## 2020-05-08 DIAGNOSIS — R404 Transient alteration of awareness: Secondary | ICD-10-CM

## 2020-05-08 DIAGNOSIS — R42 Dizziness and giddiness: Secondary | ICD-10-CM

## 2020-05-17 DIAGNOSIS — H25041 Posterior subcapsular polar age-related cataract, right eye: Secondary | ICD-10-CM | POA: Diagnosis not present

## 2020-05-17 DIAGNOSIS — H2512 Age-related nuclear cataract, left eye: Secondary | ICD-10-CM | POA: Diagnosis not present

## 2020-05-17 DIAGNOSIS — H2511 Age-related nuclear cataract, right eye: Secondary | ICD-10-CM | POA: Diagnosis not present

## 2020-05-17 DIAGNOSIS — H25011 Cortical age-related cataract, right eye: Secondary | ICD-10-CM | POA: Diagnosis not present

## 2020-05-23 DIAGNOSIS — E039 Hypothyroidism, unspecified: Secondary | ICD-10-CM | POA: Diagnosis not present

## 2020-05-23 DIAGNOSIS — D869 Sarcoidosis, unspecified: Secondary | ICD-10-CM | POA: Diagnosis not present

## 2020-05-23 DIAGNOSIS — R000 Tachycardia, unspecified: Secondary | ICD-10-CM | POA: Diagnosis not present

## 2020-05-23 DIAGNOSIS — M8580 Other specified disorders of bone density and structure, unspecified site: Secondary | ICD-10-CM | POA: Diagnosis not present

## 2020-05-23 DIAGNOSIS — R55 Syncope and collapse: Secondary | ICD-10-CM | POA: Diagnosis not present

## 2020-05-29 ENCOUNTER — Ambulatory Visit
Admission: RE | Admit: 2020-05-29 | Discharge: 2020-05-29 | Disposition: A | Discharge status: Home / Self Care | Payer: PPO | Source: Ambulatory Visit | Attending: Neurology | Admitting: Neurology

## 2020-05-29 ENCOUNTER — Other Ambulatory Visit: Payer: Self-pay

## 2020-05-29 DIAGNOSIS — R55 Syncope and collapse: Secondary | ICD-10-CM

## 2020-05-29 DIAGNOSIS — R404 Transient alteration of awareness: Secondary | ICD-10-CM

## 2020-05-29 DIAGNOSIS — R2689 Other abnormalities of gait and mobility: Secondary | ICD-10-CM | POA: Diagnosis not present

## 2020-05-29 DIAGNOSIS — R42 Dizziness and giddiness: Secondary | ICD-10-CM

## 2020-05-29 DIAGNOSIS — I6621 Occlusion and stenosis of right posterior cerebral artery: Secondary | ICD-10-CM | POA: Diagnosis not present

## 2020-05-29 MED ORDER — GADOBENATE DIMEGLUMINE 529 MG/ML IV SOLN
15.0000 mL | Freq: Once | INTRAVENOUS | Status: AC | PRN
  Administered 2020-05-29: 15 mL via INTRAVENOUS

## 2020-05-30 ENCOUNTER — Telehealth: Payer: Self-pay

## 2020-05-30 DIAGNOSIS — D869 Sarcoidosis, unspecified: Secondary | ICD-10-CM | POA: Diagnosis not present

## 2020-05-30 DIAGNOSIS — E039 Hypothyroidism, unspecified: Secondary | ICD-10-CM | POA: Diagnosis not present

## 2020-05-30 DIAGNOSIS — R55 Syncope and collapse: Secondary | ICD-10-CM | POA: Diagnosis not present

## 2020-05-30 DIAGNOSIS — M8580 Other specified disorders of bone density and structure, unspecified site: Secondary | ICD-10-CM | POA: Diagnosis not present

## 2020-05-30 DIAGNOSIS — R000 Tachycardia, unspecified: Secondary | ICD-10-CM | POA: Diagnosis not present

## 2020-05-30 LAB — HEPATIC FUNCTION PANEL
ALT: 19 (ref 7–35)
AST: 17 (ref 13–35)
Alkaline Phosphatase: 75 (ref 25–125)
Bilirubin, Total: 0.4

## 2020-05-30 LAB — BASIC METABOLIC PANEL
BUN: 21 (ref 4–21)
CO2: 25 — AB (ref 13–22)
Chloride: 104 (ref 99–108)
Creatinine: 0.7 (ref 0.5–1.1)
Glucose: 86
Sodium: 140 (ref 137–147)

## 2020-05-30 LAB — COMPREHENSIVE METABOLIC PANEL
Albumin: 4.3 (ref 3.5–5.0)
Calcium: 9.4 (ref 8.7–10.7)
GFR calc Af Amer: 100
GFR calc non Af Amer: 83

## 2020-05-30 LAB — TSH: TSH: 1.46 (ref 0.41–5.90)

## 2020-05-30 MED ORDER — LEVOTHYROXINE SODIUM 50 MCG PO TABS: ORAL_TABLET | ORAL | 0 refills | Status: DC

## 2020-05-30 NOTE — Telephone Encounter (Signed)
[facility] Night - Client Nonclinical Telephone Record  [facility] Client [place] Night - Client Client Site [place] Night Physician Raoul Pitch, [place] Type Call Who Is Calling Patient / Member / Family / Caregiver Caller Name [facility] Phone Number [phone] Patient Name Grace Schmitt Patient DOB 1946/12/16  Call Type Message Only Information Provided Reason for Call Request for General Office Information Initial Comment Caller states her thyroid medication needs approval. PT has 3 pills left.  Additional Comment Declined to speak to a nurse. Disp. Time Disposition Final User 05/29/2020 5:09:18 PM General Information Provided Yes Claudette Laws Call Closed By: Claudette Laws Transaction Date/Time: 05/29/2020 5:06:54 PM (ET)

## 2020-05-30 NOTE — Addendum Note (Signed)
Addended by: Caroll Rancher L on: 05/30/2020 09:24 AM   Modules accepted: Orders

## 2020-05-30 NOTE — Telephone Encounter (Signed)
Pt was called and stated she only has a couple of pills left and needs some so she does not run out. Pt advised short supply would be sent to pharmacy but it looked like she was due for her [facility] appt. Pt advised her thyroid had not been checked by [place] in over a year. She said her Endocrinologist checked that recently. Pt said she would call back to schedule appt when she got home. Faxed request for most recent labs from Dr Buddy Duty

## 2020-05-31 ENCOUNTER — Telehealth: Payer: Self-pay | Admitting: Neurology

## 2020-05-31 NOTE — Telephone Encounter (Signed)
Spoke to patient about MRA, no changes that would cause her symptoms. 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 She had horrible leg pain/joint pain with lipitor, she will discuss other options with Dr. Raoul Pitch on f/u on Wed. She is trying to see when she can go for the inpatient video EEG monitoring, trying to tolerate the Topamax as best she can but she does not like it. Does not want to start a different AED. Spoke to her endocrinologist as well and had bloodwork with him.

## 2020-06-05 ENCOUNTER — Encounter: Payer: Self-pay | Admitting: Family Medicine

## 2020-06-05 ENCOUNTER — Ambulatory Visit (INDEPENDENT_AMBULATORY_CARE_PROVIDER_SITE_OTHER): Payer: PPO | Admitting: Family Medicine

## 2020-06-05 ENCOUNTER — Other Ambulatory Visit: Payer: Self-pay

## 2020-06-05 VITALS — BP 129/85 | HR 79 | Temp 98.0°F | Resp 18 | Ht 63.0 in | Wt 182.5 lb

## 2020-06-05 DIAGNOSIS — I6789 Other cerebrovascular disease: Secondary | ICD-10-CM

## 2020-06-05 DIAGNOSIS — E7800 Pure hypercholesterolemia, unspecified: Secondary | ICD-10-CM | POA: Diagnosis not present

## 2020-06-05 DIAGNOSIS — Z789 Other specified health status: Secondary | ICD-10-CM | POA: Diagnosis not present

## 2020-06-05 DIAGNOSIS — E039 Hypothyroidism, unspecified: Secondary | ICD-10-CM | POA: Diagnosis not present

## 2020-06-05 LAB — BASIC METABOLIC PANEL
BUN: 21 (ref 4–21)
CO2: 25 — AB (ref 13–22)
Chloride: 104 (ref 99–108)
Creatinine: 0.7 (ref 0.5–1.1)
Potassium: 4.6 (ref 3.4–5.3)
Sodium: 140 (ref 137–147)

## 2020-06-05 LAB — HEPATIC FUNCTION PANEL
ALT: 75 — AB (ref 7–35)
AST: 17 (ref 13–35)

## 2020-06-05 LAB — COMPREHENSIVE METABOLIC PANEL
Albumin: 4.3 (ref 3.5–5.0)
GFR calc Af Amer: 100
GFR calc non Af Amer: 83

## 2020-06-05 LAB — TSH: TSH: 1.46 (ref 0.41–5.90)

## 2020-06-05 MED ORDER — REPATHA 140 MG/ML ~~LOC~~ SOSY: 140.0000 mg | PREFILLED_SYRINGE | SUBCUTANEOUS | 11 refills | Status: DC
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
## 2020-06-05 NOTE — Patient Instructions (Signed)
We will call you once we get your labs from endo and refill your thyroid med.  We will  Prescribe one of the injectable formats of cholesterol control for you. Once you pick it up from pharmacy > call in to make a nurse appt for instruction of use.     Great to see you both today.

## 2020-06-06 ENCOUNTER — Telehealth: Payer: Self-pay

## 2020-06-06 ENCOUNTER — Other Ambulatory Visit: Payer: Self-pay | Admitting: Neurology

## 2020-06-06 ENCOUNTER — Telehealth: Payer: Self-pay | Admitting: Family Medicine

## 2020-06-06 DIAGNOSIS — R404 Transient alteration of awareness: Secondary | ICD-10-CM

## 2020-06-06 DIAGNOSIS — R569 Unspecified convulsions: Secondary | ICD-10-CM

## 2020-06-06 DIAGNOSIS — R55 Syncope and collapse: Secondary | ICD-10-CM

## 2020-06-06 DIAGNOSIS — Z789 Other specified health status: Secondary | ICD-10-CM

## 2020-06-06 HISTORY — DX: Other specified health status: Z78.9

## 2020-06-06 MED ORDER — LEVOTHYROXINE SODIUM 50 MCG PO TABS: ORAL_TABLET | ORAL | 3 refills | Status: DC

## 2020-06-06 NOTE — Addendum Note (Signed)
Addended by: Howard Pouch A on: 06/06/2020 02:40 PM   Modules accepted: Orders

## 2020-06-06 NOTE — Telephone Encounter (Signed)
Left detailed message on pts VM with results

## 2020-06-06 NOTE — Telephone Encounter (Signed)
PA completed on covermy meds  Grace Schmitt ([facility]) Rx #: [id number] Repatha 140MG /ML syringes   Form [employer] Medicare 4-Part [place] Electronic PA Form

## 2020-06-06 NOTE — Telephone Encounter (Signed)
error 

## 2020-06-06 NOTE — Telephone Encounter (Signed)
Please inform patient we have received the faxed results from her endocrine.  Her thyroid function was normal.  I have refilled her thyroid medication.

## 2020-06-07 NOTE — Telephone Encounter (Signed)
Received approval for medication. Faxed to pharmacy. Sent to scan.

## 2020-06-12 ENCOUNTER — Encounter: Payer: Self-pay | Admitting: Family Medicine

## 2020-06-14 DIAGNOSIS — H2513 Age-related nuclear cataract, bilateral: Secondary | ICD-10-CM | POA: Diagnosis not present

## 2020-06-14 DIAGNOSIS — H2511 Age-related nuclear cataract, right eye: Secondary | ICD-10-CM | POA: Diagnosis not present

## 2020-06-19 ENCOUNTER — Telehealth: Payer: Self-pay

## 2020-06-19 DIAGNOSIS — R55 Syncope and collapse: Secondary | ICD-10-CM | POA: Diagnosis not present

## 2020-06-19 NOTE — Telephone Encounter (Signed)
Patient called to schedule nurse appt. Patient picked up her prescription for   Evolocumab ([place]) 140 MG/ML SOSY [[id number]]    and was told to call office to schedule nurse appt for instruction on proper use.  Patient can be reached at [phone]

## 2020-06-19 NOTE — Telephone Encounter (Signed)
I do not know whether Lattie Haw or Maudie Mercury would do this. Kim, could you let me know if Lattie Haw would be the one to show pt how to do this?

## 2020-06-19 NOTE — Telephone Encounter (Signed)
She be scheduled here for a a 30 min nurse visit unless she wants to ask the pharmacy if they can show her how to do it.

## 2020-06-19 NOTE — Telephone Encounter (Signed)
If her pharmacy cannot assist her, I'm sure Lattie Haw or I can help her. She will need 30 minute nurse visit.

## 2020-06-28 DIAGNOSIS — M8580 Other specified disorders of bone density and structure, unspecified site: Secondary | ICD-10-CM | POA: Diagnosis not present

## 2020-06-28 DIAGNOSIS — D869 Sarcoidosis, unspecified: Secondary | ICD-10-CM | POA: Diagnosis not present

## 2020-06-28 DIAGNOSIS — E039 Hypothyroidism, unspecified: Secondary | ICD-10-CM | POA: Diagnosis not present

## 2020-06-28 DIAGNOSIS — R55 Syncope and collapse: Secondary | ICD-10-CM | POA: Diagnosis not present

## 2020-07-04 ENCOUNTER — Encounter: Payer: Self-pay | Admitting: Family Medicine

## 2020-07-30 DIAGNOSIS — R569 Unspecified convulsions: Secondary | ICD-10-CM | POA: Diagnosis not present

## 2020-07-30 DIAGNOSIS — G2581 Restless legs syndrome: Secondary | ICD-10-CM | POA: Diagnosis not present

## 2020-07-30 DIAGNOSIS — M797 Fibromyalgia: Secondary | ICD-10-CM | POA: Diagnosis not present

## 2020-08-01 ENCOUNTER — Other Ambulatory Visit: Payer: Self-pay

## 2020-08-01 ENCOUNTER — Ambulatory Visit: Payer: PPO | Admitting: Neurology

## 2020-08-01 ENCOUNTER — Encounter: Payer: Self-pay | Admitting: Neurology

## 2020-08-01 VITALS — BP 122/83 | HR 84 | Ht 63.0 in | Wt 182.6 lb

## 2020-08-01 DIAGNOSIS — R55 Syncope and collapse: Secondary | ICD-10-CM

## 2020-08-01 DIAGNOSIS — R569 Unspecified convulsions: Secondary | ICD-10-CM | POA: Diagnosis not present

## 2020-08-01 DIAGNOSIS — R404 Transient alteration of awareness: Secondary | ICD-10-CM

## 2020-08-01 MED ORDER — TOPIRAMATE 50 MG PO TABS: 50.0000 mg | ORAL_TABLET | Freq: Two times a day (BID) | ORAL | 3 refills | Status: DC
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
## 2020-08-01 NOTE — Patient Instructions (Signed)
1. Continue Topiramate 50mg  twice a day  2. Can try taking as needed lorazepam to break the clusters  3. See how the methadone helps with joint pains as well, then consider holding Repatha to see if you feel better

## 2020-08-07 ENCOUNTER — Telehealth: Payer: Self-pay

## 2020-08-07 NOTE — Telephone Encounter (Signed)
PA sent via covermymed on 08/07/20   Key: [id number]   Medication: Repatha 140MG /ML syringes   Dx: Pure hypercholesterolemia, cerebral microvascular disease E78.00, I67.89   Per Dr. Raoul Pitch pt has tried and failed, statin intolerance   Waiting for response.

## 2020-08-07 NOTE — Telephone Encounter (Signed)
Received approval for Repatha 140 MG\ML Syringe approved thorough  10-06-2020.  [id number]

## 2020-08-08 DIAGNOSIS — G4733 Obstructive sleep apnea (adult) (pediatric): Secondary | ICD-10-CM | POA: Diagnosis not present

## 2020-09-17 ENCOUNTER — Other Ambulatory Visit: Payer: Self-pay

## 2020-09-17 DIAGNOSIS — R569 Unspecified convulsions: Secondary | ICD-10-CM

## 2020-09-17 DIAGNOSIS — R55 Syncope and collapse: Secondary | ICD-10-CM

## 2020-09-17 DIAGNOSIS — R42 Dizziness and giddiness: Secondary | ICD-10-CM

## 2020-10-03 ENCOUNTER — Telehealth: Payer: Self-pay

## 2020-10-03 NOTE — Telephone Encounter (Signed)
s been approved for repatha 140 mg/ml syringe until 10/02/21

## 2020-10-10 NOTE — Telephone Encounter (Signed)
Additional information given to insurance for Repatha on covermymeds. Key: [id number]

## 2020-11-07 ENCOUNTER — Telehealth: Payer: Self-pay | Admitting: Neurology

## 2020-11-07 NOTE — Telephone Encounter (Signed)
Patient called back in and wanted to make sure Nira Conn was aware that she just had a partial seizure a few minutes ago.

## 2020-11-07 NOTE — Telephone Encounter (Signed)
If we can arrange to have it done sooner, can she come for the inpatient stay potentially next week?

## 2020-11-07 NOTE — Telephone Encounter (Signed)
Patient's daughter called in concerned. She stated she has been home with the patient for about 3 days now and her mother has had 8 of these "minis seizures".  She said it's affecting her greatly and she is having a difficult time communicating. They would like for her to be seen as soon as possible.

## 2020-11-07 NOTE — Telephone Encounter (Signed)
Pt c/o: seizure Missed medications?  No. Sleep deprived?  No. Alcohol intake?  No. Back to their usual baseline self?  Yes.  . If no, advise go to ER Current medications prescribed by Dr. Delice Lesch: topamax 50 mg   Still not feeling right head doesn't feel right, but feeling better than yesterday has pressure.  Can not tolerate increase of topamax  [facility] not [facility] until Jan asking if it can be done sooner,

## 2020-11-07 NOTE — Telephone Encounter (Signed)
Patient called in stating she has had more partial seizures than usual and seem to be more serious. She feels "drugged out" after. She had 3 on Tuesday and 3 yesterday. She has not had any so far today.

## 2020-11-08 ENCOUNTER — Other Ambulatory Visit: Payer: Self-pay

## 2020-11-08 DIAGNOSIS — R569 Unspecified convulsions: Secondary | ICD-10-CM

## 2020-11-08 NOTE — Telephone Encounter (Signed)
See other phone note

## 2020-11-08 NOTE — Telephone Encounter (Signed)
Pt stated that she can do inpatient EEG order placed in Epic

## 2020-11-08 NOTE — Telephone Encounter (Signed)
See other phone notes.

## 2020-11-11 ENCOUNTER — Encounter (HOSPITAL_COMMUNITY): Payer: Self-pay | Admitting: Student in an Organized Health Care Education/Training Program

## 2020-11-11 ENCOUNTER — Inpatient Hospital Stay (HOSPITAL_COMMUNITY)
Admission: AD | Admit: 2020-11-11 | Discharge: 2020-11-15 | DRG: 101 | Disposition: A | Discharge status: Home / Self Care | Payer: PPO | Source: Ambulatory Visit | Attending: Neurology | Admitting: Neurology

## 2020-11-11 ENCOUNTER — Other Ambulatory Visit: Payer: Self-pay

## 2020-11-11 DIAGNOSIS — Z888 Allergy status to other drugs, medicaments and biological substances status: Secondary | ICD-10-CM

## 2020-11-11 DIAGNOSIS — F32A Depression, unspecified: Secondary | ICD-10-CM | POA: Diagnosis not present

## 2020-11-11 DIAGNOSIS — Z79890 Hormone replacement therapy (postmenopausal): Secondary | ICD-10-CM | POA: Diagnosis not present

## 2020-11-11 DIAGNOSIS — Z841 Family history of disorders of kidney and ureter: Secondary | ICD-10-CM

## 2020-11-11 DIAGNOSIS — Z8249 Family history of ischemic heart disease and other diseases of the circulatory system: Secondary | ICD-10-CM

## 2020-11-11 DIAGNOSIS — Z20822 Contact with and (suspected) exposure to covid-19: Secondary | ICD-10-CM | POA: Diagnosis not present

## 2020-11-11 DIAGNOSIS — M797 Fibromyalgia: Secondary | ICD-10-CM | POA: Diagnosis not present

## 2020-11-11 DIAGNOSIS — G40909 Epilepsy, unspecified, not intractable, without status epilepticus: Principal | ICD-10-CM | POA: Diagnosis present

## 2020-11-11 DIAGNOSIS — R404 Transient alteration of awareness: Secondary | ICD-10-CM | POA: Diagnosis present

## 2020-11-11 DIAGNOSIS — G2581 Restless legs syndrome: Secondary | ICD-10-CM | POA: Diagnosis present

## 2020-11-11 DIAGNOSIS — E039 Hypothyroidism, unspecified: Secondary | ICD-10-CM | POA: Diagnosis present

## 2020-11-11 DIAGNOSIS — Z83438 Family history of other disorder of lipoprotein metabolism and other lipidemia: Secondary | ICD-10-CM | POA: Diagnosis not present

## 2020-11-11 MED ORDER — LABETALOL HCL 5 MG/ML IV SOLN: 10.0000 mg | INTRAVENOUS | Status: DC | PRN

## 2020-11-11 MED ORDER — ENOXAPARIN SODIUM 40 MG/0.4ML ~~LOC~~ SOLN
40.0000 mg | SUBCUTANEOUS | Status: DC
  Administered 2020-11-12 – 2020-11-15 (×4): 40 mg via SUBCUTANEOUS
  Filled 2020-11-11 (×4): qty 0.4
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
## 2020-11-11 NOTE — Progress Notes (Signed)
Pt admitted directly from home with the c/o of seizure like activity, pt oriented x4, denies any discomfort at this time, settled in bed with call light and husband at bedside, Dr Rory Percy (on call) paged and notified of pt's arrival, safety concern addressed accordingly, was however reassured and will continue to monitor. Obasogie-Asidi, Kristofer Schaffert Efe

## 2020-11-12 ENCOUNTER — Inpatient Hospital Stay (HOSPITAL_COMMUNITY): Payer: PPO

## 2020-11-12 ENCOUNTER — Encounter (HOSPITAL_COMMUNITY): Payer: Self-pay | Admitting: Student in an Organized Health Care Education/Training Program

## 2020-11-12 DIAGNOSIS — R404 Transient alteration of awareness: Secondary | ICD-10-CM | POA: Diagnosis not present

## 2020-11-12 LAB — CBC WITH DIFFERENTIAL/PLATELET
Abs Immature Granulocytes: 0.04 10*3/uL (ref 0.00–0.07)
Basophils Absolute: 0.1 10*3/uL (ref 0.0–0.1)
Basophils Relative: 1 %
Eosinophils Absolute: 0.5 10*3/uL (ref 0.0–0.5)
Eosinophils Relative: 5 %
HCT: 38.8 % (ref 36.0–46.0)
Hemoglobin: 12.9 g/dL (ref 12.0–15.0)
Immature Granulocytes: 0 %
Lymphocytes Relative: 24 %
Lymphs Abs: 2.4 10*3/uL (ref 0.7–4.0)
MCH: 31.6 pg (ref 26.0–34.0)
MCHC: 33.2 g/dL (ref 30.0–36.0)
MCV: 95.1 fL (ref 80.0–100.0)
Monocytes Absolute: 0.8 10*3/uL (ref 0.1–1.0)
Monocytes Relative: 8 %
Neutro Abs: 6.1 10*3/uL (ref 1.7–7.7)
Neutrophils Relative %: 62 %
Platelets: 309 10*3/uL (ref 150–400)
RBC: 4.08 MIL/uL (ref 3.87–5.11)
RDW: 12.9 % (ref 11.5–15.5)
WBC: 9.8 10*3/uL (ref 4.0–10.5)
nRBC: 0 % (ref 0.0–0.2)

## 2020-11-12 LAB — COMPREHENSIVE METABOLIC PANEL
ALT: 15 U/L (ref 0–44)
AST: 17 U/L (ref 15–41)
Albumin: 3.4 g/dL — ABNORMAL LOW (ref 3.5–5.0)
Alkaline Phosphatase: 62 U/L (ref 38–126)
Anion gap: 11 (ref 5–15)
BUN: 13 mg/dL (ref 8–23)
CO2: 23 mmol/L (ref 22–32)
Calcium: 9 mg/dL (ref 8.9–10.3)
Chloride: 106 mmol/L (ref 98–111)
Creatinine, Ser: 0.71 mg/dL (ref 0.44–1.00)
GFR, Estimated: >60 mL/min (ref >60)
Glucose, Bld: 102 mg/dL — ABNORMAL HIGH (ref 70–99)
Potassium: 3.9 mmol/L (ref 3.5–5.1)
Sodium: 140 mmol/L (ref 135–145)
Total Bilirubin: 0.5 mg/dL (ref 0.3–1.2)
Total Protein: 6.9 g/dL (ref 6.5–8.1)

## 2020-11-12 LAB — PROTIME-INR
INR: 1 (ref 0.8–1.2)
Prothrombin Time: 13.2 seconds (ref 11.4–15.2)

## 2020-11-12 LAB — RAPID URINE DRUG SCREEN, HOSP PERFORMED
Amphetamines: NOT DETECTED
Barbiturates: NOT DETECTED
Benzodiazepines: NOT DETECTED
Cocaine: NOT DETECTED
Opiates: NOT DETECTED
Tetrahydrocannabinol: NOT DETECTED

## 2020-11-12 LAB — MAGNESIUM: Magnesium: 2.3 mg/dL (ref 1.7–2.4)

## 2020-11-12 LAB — RESPIRATORY PANEL BY RT PCR (FLU A&B, COVID)
Influenza A by PCR: NEGATIVE
Influenza B by PCR: NEGATIVE
SARS Coronavirus 2 by RT PCR: NEGATIVE

## 2020-11-12 LAB — PHOSPHORUS: Phosphorus: 4 mg/dL (ref 2.5–4.6)

## 2020-11-12 MED ORDER — TOPIRAMATE 25 MG PO TABS
50.0000 mg | ORAL_TABLET | Freq: Two times a day (BID) | ORAL | Status: DC
  Administered 2020-11-12: 50 mg via ORAL
  Filled 2020-11-12: qty 2

## 2020-11-12 MED ORDER — ASPIRIN EC 81 MG PO TBEC
81.0000 mg | DELAYED_RELEASE_TABLET | Freq: Every day | ORAL | Status: DC
  Administered 2020-11-12 – 2020-11-15 (×4): 81 mg via ORAL
  Filled 2020-11-12 (×4): qty 1

## 2020-11-12 MED ORDER — LORAZEPAM 2 MG/ML IJ SOLN: 2.0000 mg | INTRAMUSCULAR | Status: DC | PRN

## 2020-11-12 MED ORDER — METHADONE HCL 5 MG PO TABS
5.0000 mg | ORAL_TABLET | Freq: Two times a day (BID) | ORAL | Status: DC
  Administered 2020-11-12 – 2020-11-15 (×7): 5 mg via ORAL
  Filled 2020-11-12 (×7): qty 1

## 2020-11-12 MED ORDER — GABAPENTIN 300 MG PO CAPS: 300.0000 mg | ORAL_CAPSULE | Freq: Every day | ORAL | Status: DC

## 2020-11-12 MED ORDER — DULOXETINE HCL 60 MG PO CPEP
60.0000 mg | ORAL_CAPSULE | Freq: Every day | ORAL | Status: DC
  Administered 2020-11-12 – 2020-11-15 (×4): 60 mg via ORAL
  Filled 2020-11-12 (×4): qty 1

## 2020-11-12 MED ORDER — LORATADINE 10 MG PO TABS
10.0000 mg | ORAL_TABLET | Freq: Every day | ORAL | Status: DC
  Administered 2020-11-12 – 2020-11-14 (×3): 10 mg via ORAL
  Filled 2020-11-12 (×3): qty 1

## 2020-11-12 MED ORDER — LEVOTHYROXINE SODIUM 50 MCG PO TABS
50.0000 ug | ORAL_TABLET | Freq: Every day | ORAL | Status: DC
  Administered 2020-11-12 – 2020-11-15 (×4): 50 ug via ORAL
  Filled 2020-11-12 (×4): qty 1

## 2020-11-12 NOTE — Progress Notes (Signed)
EMU LTM set up; no initial skin breakdown was seen. Educated patient and husband on event button; [facility] monitoring.

## 2020-11-12 NOTE — Progress Notes (Signed)
Subjective: No events overnight.  Patient states she has had 12-13 episodes of last week.  Also reports significant decrease in stress due to her daughter's diagnosis of cancer.  ROS: negative except above  Examination  Vital signs in last 24 hours: Temp:  [97.1 F (36.2 C)-98.5 F (36.9 C)] 97.1 F (36.2 C) (11/16 1156) Pulse Rate:  [63-90] 65 (11/16 1156) Resp:  [16-18] 16 (11/16 1156) BP: (109-148)/(55-85) 115/61 (11/16 1156) SpO2:  [97 %-100 %] 97 % (11/16 1156) Weight:  [80.8 kg] 80.8 kg (11/15 2038)  General: lying in bed, not in apparent distress CVS: pulse-normal rate and rhythm RS: breathing comfortably, CTAB Extremities: normal, warm  Neuro: MS: Alert, oriented, follows commands CN: pupils equal and reactive,  EOMI, face symmetric, tongue midline, normal sensation over face, Motor: 5/5 strength in all 4 extremities Reflexes: 2+ bilaterally over patella, biceps, plantars: flexor Coordination: normal Gait: not tested  Basic Metabolic Panel: Recent Labs  Lab 11/12/20 0455  NA 140  K 3.9  CL 106  CO2 23  GLUCOSE 102*  BUN 13  CREATININE 0.71  CALCIUM 9.0  MG 2.3  PHOS 4.0    CBC: Recent Labs  Lab 11/12/20 0455  WBC 9.8  NEUTROABS 6.1  HGB 12.9  HCT 38.8  MCV 95.1  PLT 309     Coagulation Studies: Recent Labs    11/12/20 0455  LABPROT 13.2  INR 1.0    Imaging MRI brain without contrast 04/12/2020: No acute abnormality.  Bilateral basal ganglia chronic liver infarcts.  Generalized parenchymal atrophy.  Moderate chronic small vessel ischemic changes.  ASSESSMENT AND PLAN: 74 year old female with multiple episodes of transient alteration of awareness now admitted for characterization of spells.  Transient alteration of awareness -We will start video EEG monitoring for characterization of spells -We will hold gabapentin and topiramate for seizure provocation -We will perform HV and photic stimulation as well as encourage sleep deprivation if  needed -Seizure precautions - As needed IV Ativan 2 mg for EDC lasting more than 2 minutes, focal seizure lasting more than 5 minutes  Restless leg syndrome -Continue home dose of methadone  Fibromyalgia -Continue home duloxetine  Allergies -Continue home loratadine  Hypothyroidism -Continue levothyroxine  I have spent a total of  35  minutes with the patient reviewing hospital notes,  test results, labs and examining the patient as well as establishing an assessment and plan that was discussed personally with the patient and her husband at bedside.  > 50% of time was spent in direct patient care.    Zeb Comfort Epilepsy [facility] For questions after 5pm please refer to AMION to reach the Neurologist on call

## 2020-11-12 NOTE — Progress Notes (Addendum)
Called by EEG lab that patient was pressing her button; patient proceeded to get out of bed with her husband to urinate on the bedside commode; she didn't want to wet the bed; bed alarm sounded and staff went quickly to the room; patient states the episode was over before she exited the bed; lasted a few seconds; telemetry confirmed her heart rate went to 115 bpm @ 1333-1334; patient without symptoms or event when staff entered the room; safety reviewed with patient and her spouse.

## 2020-11-13 DIAGNOSIS — R404 Transient alteration of awareness: Secondary | ICD-10-CM | POA: Diagnosis not present

## 2020-11-13 LAB — GLUCOSE, CAPILLARY: Glucose-Capillary: 144 mg/dL — ABNORMAL HIGH (ref 70–99)

## 2020-11-13 NOTE — Progress Notes (Signed)
Subjective: Had an episode where she felt lightheaded and then had an urge to urinate.  Denies any other episodes overnight.  ROS: negative except above Examination  Vital signs in last 24 hours: Temp:  [97.6 F (36.4 C)-98.4 F (36.9 C)] 97.8 F (36.6 C) (11/17 0807) Pulse Rate:  [73-79] 73 (11/17 0323) Resp:  [14-18] 18 (11/17 0323) BP: (113-144)/(62-67) 124/65 (11/17 0323) SpO2:  [95 %-98 %] 96 % (11/17 0323)  General: lying in bed, not in apparent distress CVS: pulse-normal rate and rhythm RS: breathing comfortably, CTA B Extremities: normal, warm  Neuro: AOx3, cranial nerves 2- 12 grossly intact, 5/5 in upper extremities  Basic Metabolic Panel: Recent Labs  Lab 11/12/20 0455  NA 140  K 3.9  CL 106  CO2 23  GLUCOSE 102*  BUN 13  CREATININE 0.71  CALCIUM 9.0  MG 2.3  PHOS 4.0    CBC: Recent Labs  Lab 11/12/20 0455  WBC 9.8  NEUTROABS 6.1  HGB 12.9  HCT 38.8  MCV 95.1  PLT 309     Coagulation Studies: Recent Labs    11/12/20 0455  LABPROT 13.2  INR 1.0    Imaging No new brain imaging overnight  ASSESSMENT AND PLAN: 74 year old female with multiple episodes of transient alteration of awareness now admitted for characterization of spells.  Transient alteration of awareness -Continue video EEG monitoring for characterization of spells -Continue to hold gabapentin and topiramate for seizure provocation -We will perform HV and photic stimulation as well as encourage sleep deprivation as seizure provocative measures -Seizure precautions - As needed IV Ativan 2 mg for EDC lasting more than 2 minutes, focal seizure lasting more than 5 minutes  Restless leg syndrome -Continue home dose of methadone  Fibromyalgia -Continue home duloxetine  Allergies -Continue home loratadine  Hypothyroidism -Continue levothyroxine  I have spent a total of 25  minuteswith the patient reviewing hospitalnotes,  test results, labs and examining the  patient as well as establishing an assessment and plan that was discussed personally with the patient and her husband at bedside.>50% of time was spent in direct patient care.    Zeb Comfort Epilepsy [facility] For questions after 5pm please refer to AMION to reach the Neurologist on call

## 2020-11-13 NOTE — Progress Notes (Signed)
EEG EMU note: HV and photic done with no complications. continue to monitor

## 2020-11-13 NOTE — Progress Notes (Signed)
LTM maint complete - no skin breakdown under:  Fp1, fp2, f8

## 2020-11-13 NOTE — Procedures (Addendum)
Patient Name: Grace Schmitt  MRN: [id number]  Epilepsy Attending: Lora Havens  Referring Physician/Provider:  Dr Amie Portland Duration: 11/12/2020 0825 to 11/13/2020 0825   Patient history: 74 year old female with episodes of transient alteration of awareness with urinary incontinence.  EEG to evaluate for seizures.  Level of alertness: Awake, asleep  AEDs during EEG study: None  Technical aspects: This EEG study was done with scalp electrodes positioned according to the 10-20 International system of electrode placement. Electrical activity was acquired at a sampling rate of 500Hz  and reviewed with a high frequency filter of 70Hz  and a low frequency filter of 1Hz . EEG data were recorded continuously and digitally stored.   Description: The posterior dominant rhythm consists of 9 Hz activity of moderate voltage (25-35 uV) seen predominantly in posterior head regions, symmetric and reactive to eye opening and eye closing.  Sleep was characterized by vertex waves, sleep spindles (12 to 14 Hz), maximal frontocentral region. Sharp transients were seen in right temporal region.  Event button was pressed on 11/12/2020 at 1333.  Patient reports feeling confused briefly followed by an urge to urinate.  Patient's heart rate did jump from a baseline of 75 to 115 Concomitant EEG did not show any EEG change to suggest seizure.     IMPRESSION: This study is within normal limits. No seizures or definite epileptiform discharges were seen throughout the recording.  Event button was pressed on 11/12/2020 at 1333 during which she reported feeling confused followed by an urge to urinate without concomitant EEG change.  However, aura and focal sensory seizures may not always be seen on scalp EEG.   [name]

## 2020-11-14 DIAGNOSIS — R404 Transient alteration of awareness: Secondary | ICD-10-CM | POA: Diagnosis not present

## 2020-11-14 NOTE — Progress Notes (Signed)
Pt had a quiet night, stayed awake all night as ordered, no seizure like activity observed, husband at bedside, was however reassured and will continue to monitor, v/s stable. Obasogie-Asidi, Rayhan Groleau Efe

## 2020-11-14 NOTE — Progress Notes (Signed)
Subjective: No further events overnight.  No new concerns.  ROS: negative except above  Examination  Vital signs in last 24 hours: Temp:  [97.7 F (36.5 C)-98.4 F (36.9 C)] 98.3 F (36.8 C) (11/18 1533) Pulse Rate:  [72-99] 84 (11/18 1533) Resp:  [15-20] 18 (11/18 1533) BP: (105-131)/(64-78) 123/70 (11/18 1533) SpO2:  [95 %-99 %] 98 % (11/18 1533)  General: lying in bed, not in apparent distress CVS: pulse-normal rate and rhythm RS: breathing comfortably, CTA B Extremities: normal, warm Neuro: AOx3, cranial nerves 2- 12 grossly intact, 5/5 in upper extremities   Basic Metabolic Panel: Recent Labs  Lab 11/12/20 0455  NA 140  K 3.9  CL 106  CO2 23  GLUCOSE 102*  BUN 13  CREATININE 0.71  CALCIUM 9.0  MG 2.3  PHOS 4.0    CBC: Recent Labs  Lab 11/12/20 0455  WBC 9.8  NEUTROABS 6.1  HGB 12.9  HCT 38.8  MCV 95.1  PLT 309     Coagulation Studies: Recent Labs    11/12/20 0455  LABPROT 13.2  INR 1.0    Imaging No new brain imaging overnight  ASSESSMENT AND PLAN: 74yearold female with multiple episodes of transient alteration of awareness now admitted for characterization of spells.  Transient alteration of awareness -Continue video EEG monitoring for characterization of spells -Continue to hold gabapentin and topiramatefor seizure provocation -Seizure precautions -As needed IV Ativan 2 mg for EDC lasting more than 2 minutes, focal seizure lasting more than 5 minutes  Restless leg syndrome -Continue home dose of methadone  Fibromyalgia -Continue home duloxetine  Allergies -Continue home loratadine  Hypothyroidism -Continue levothyroxine  I have spent a total of57minuteswith the patient reviewing hospitalnotes, test results, labs and examining the patient as well as establishing an assessment and plan that was discussed personally with the patient and her husband at bedside.>50% of time was spent in direct patient  care.   Zeb Comfort Epilepsy [facility] For questions after 5pm please refer to AMION to reach the Neurologist on call

## 2020-11-14 NOTE — Procedures (Signed)
Patient Name: ASALEE BARRETTE  MRN: [id number]  Epilepsy Attending: Lora Havens  Referring Physician/Provider:  Dr Amie Portland Duration: 11/13/2020 0825 to 11/14/2020 0825   Patient history: 74 year old female with episodes of transient alteration of awareness with urinary incontinence.  EEG to evaluate for seizures.  Level of alertness: Awake, asleep  AEDs during EEG study: None  Technical aspects: This EEG study was done with scalp electrodes positioned according to the 10-20 International system of electrode placement. Electrical activity was acquired at a sampling rate of 500Hz  and reviewed with a high frequency filter of 70Hz  and a low frequency filter of 1Hz . EEG data were recorded continuously and digitally stored.   Description: The posterior dominant rhythm consists of 9 Hz activity of moderate voltage (25-35 uV) seen predominantly in posterior head regions, symmetric and reactive to eye opening and eye closing.  Sleep was characterized by vertex waves, sleep spindles (12 to 14 Hz), maximal frontocentral region. Sharp transients were seen in right temporal region.  IMPRESSION: This study is within normal limits. No seizures or definite epileptiform discharges were seen throughout the recording.  Phyillis Dascoli [name]

## 2020-11-14 NOTE — Progress Notes (Signed)
LTM maint complete - no skin breakdown under:   fp2, f3 - FP1 moved .2cm away from midline due to small unblanchable redness

## 2020-11-15 DIAGNOSIS — R404 Transient alteration of awareness: Secondary | ICD-10-CM | POA: Diagnosis not present

## 2020-11-15 NOTE — Discharge Summary (Signed)
Physician Discharge Summary  Patient ID: Grace Schmitt MRN: [id number] DOB/AGE: May 29, 1946 74 y.o.  Admit date: 11/11/2020 Discharge date: 11/15/2020  Admission Diagnoses: Transient alteration of awareness  Discharge Diagnoses:  Active Problems:   Transient alteration of awareness   Discharged Condition: stable  Hospital Course: Grace Schmitt was admitted to [facility] EMU from 11/11/2020 to 11/15/2020.  During this period she underwent video EEG monitoring which showed small sharp spikes in right anterior temporal region, predominantly during sleep.  We also recorded 1 a few episodes on 11/12/2020 during which patient reported feeling lightheaded/as if she were going to pass out followed by an urge to urinate.  During this episode, however heart rate increased from a baseline of 75 to 115 within few seconds.  Given the semiology of the episodes, it is possible that the small sharp spikes in her right anterior temporal region are epileptic with a deep epileptic focus.  We discussed switching patient from topiramate to zonisamide as she reported side effects of paresthesias due to topiramate.  However, patient and her daughter requested to keep her off antiseizure medications for few months to see how frequent these episodes are.  Given the brief nature of these episodes and absence of progression to generalized tonic-clonic seizures, I agreed to keep her off Topamax/zonisamide at this point.  I would recommend contacting patient's cardiologist for a 30-day event monitor to capture these events and assess if these are cardiac in origin (has had a 7-day monitor in the past during which she did not have any events).  If at any point, these episodes become more frequent, patient can contact me for repeat admission and characterization of these episodes.  Also, if these episodes become more frequent we can consider starting her on zonisamide 100 mg nightly.  Seizure precautions including do not  drive.  Consults:None  Significant Diagnostic Studies: EEG  Description: The posterior dominant rhythm consists of9Hz  activity of moderate voltage (25-35 uV) seen predominantly in posterior head regions, symmetric and reactive to eye opening and eye closing. Sleep was characterized by vertex waves, sleep spindles (12 to 14 Hz), maximal frontocentral region. Small sharp spikes were seen in right anterior temporal region, only during sleep.  Event button was pressed on 11/12/2020 at 1333.  Patient reports feeling confused briefly followed by an urge to urinate.  Patient's heart rate did jump from a baseline of 75 to 115 Concomitant EEG did not show any EEG change to suggest seizure.  IMPRESSION: This study showed small sharp spikes in right anterior temporal region which are not definitively epileptic but the field of the spike as well as patient's seizure semiology suggests possibility of a deep epileptic focus.    Event button was pressed on 11/12/2020 at 1333 during which she reported feeling confused followed by an urge to urinate without concomitant EEG change.  However, aura and focal sensory seizures may not always be seen on scalp EEG.  Treatments: Stop topiramate  Discharge Exam: Blood pressure (!) 121/58, pulse 72, temperature 97.9 F (36.6 C), temperature source Oral, resp. rate 17, height 5\' 3"  (1.6 m), weight 80.8 kg, SpO2 97 %.   General: lying in bed,not in apparent distress CVS: pulse-normal rate and rhythm RS: breathing comfortably,CTA B Extremities: normal,warm Neuro:AOx3, cranial nerves2-12 grossly intact, 5/5 in all extremities  Disposition: Discharge disposition: 01-Home or Self Care   Discharge Instructions    Call MD for:   Complete by: As directed    Frequent seizure-like episodes, medication side effects  Diet - low sodium heart healthy   Complete by: As directed    Driving Restrictions   Complete by: As directed    For 6 months/ till cleared by  neurology   Increase activity slowly   Complete by: As directed      Allergies as of 11/15/2020      Reactions   Lipitor [atorvastatin Calcium]    Low dose caused arthralgia.   Phenergan [promethazine Hcl] Other (See Comments)   Pt has restless leg syndrome and the phenergan cause the RLS  to worsen    Statins    Cause severe myalgias      Medication List    STOP taking these medications   topiramate 50 MG tablet Commonly known as: TOPAMAX     TAKE these medications   acetaminophen 325 MG tablet Commonly known as: TYLENOL Tylenol 325 -2 tablets every 6 hours, I would take this continuously until your pain is resolved.  Do not take more than 4000 mg of Tylenol per day it can harm your liver.  You can buy this over-the-counter at any drugstore.   aspirin EC 81 MG tablet Take 81 mg by mouth daily.   BENEFIBER PO Take 1 Dose by mouth daily.   Biotin 5 MG Tabs Take 1 tablet by mouth daily.   Calcium 500/D 500-200 MG-UNIT Tabs Generic drug: Calcium Carb-Cholecalciferol Take 1 tablet by mouth.   co-enzyme Q-10 30 MG capsule Take 30 mg by mouth daily.   docusate sodium 100 MG capsule Commonly known as: COLACE You can buy Colace over-the-counter at any drugstore.  Follow package directions.  Your aim is to have 1 or 2 soft bowel movements per day.  You can adjust the dose as needed. What changed:   how much to take  how to take this  when to take this  reasons to take this  additional instructions   DULoxetine 60 MG capsule Commonly known as: CYMBALTA Take 60 mg by mouth daily.   Flaxseed Oil 1200 MG Caps Take by mouth.   fluticasone 50 MCG/ACT nasal spray Commonly known as: FLONASE Place 2 sprays into both nostrils daily.   gabapentin 300 MG capsule Commonly known as: NEURONTIN Take 300 mg by mouth daily.   IRON (FERROUS SULFATE) PO Take 1 tablet by mouth daily.   KRILL OIL PO Take 1,000 mg by mouth daily.   levothyroxine 50 MCG tablet Commonly  known as: SYNTHROID TAKE 1 TABLET (50 MCG TOTAL) BY MOUTH DAILY. What changed:   how much to take  how to take this  when to take this  additional instructions   loratadine 10 MG tablet Commonly known as: CLARITIN Take 10 mg by mouth at bedtime.   MAGNESIUM 27 PO Take 1,300 mg by mouth daily.   methadone 5 MG tablet Commonly known as: DOLOPHINE Take 5 mg by mouth 2 (two) times daily. For restless legs   multivitamin with minerals Tabs tablet Take 1 tablet by mouth 2 (two) times daily.   Repatha 140 MG/ML Sosy Generic drug: Evolocumab Inject 140 mg into the skin every 14 (fourteen) days.   vitamin C 500 MG tablet Commonly known as: ASCORBIC ACID Take 500 mg by mouth daily.   zinc gluconate 50 MG tablet Take 50 mg by mouth daily.         I have spent a total of 45  minutes with the patient reviewing hospital notes,  test results, labs and examining the patient as well as establishing  an assessment and plan that was discussed personally with the patient and her daughter at bedside.  > 50% of time was spent in direct patient care.    Signed: Lora Havens 11/15/2020, 11:21 AM

## 2020-11-15 NOTE — TOC Transition Note (Signed)
Transition of Care [facility]) - CM/SW Discharge Note   Patient Details  Name: Grace Schmitt MRN: [id number] Date of Birth: 23-Jun-1946  [facility]) CM/SW Contact:  Pollie Friar, RN Phone Number: 11/15/2020, 11:51 AM   Clinical Narrative:    Pt is discharging home with self care. No needs per TOC.   Final next level of care: Home/Self Care Barriers to Discharge: No Barriers Identified   Patient Goals and CMS Choice        Discharge Placement                       Discharge Plan and Services                                     Social Determinants of Health (SDOH) Interventions     Readmission Risk Interventions No flowsheet data found.

## 2020-11-15 NOTE — Care Management Important Message (Signed)
Important Message  Patient Details  Name: Grace Schmitt MRN: [id number] Date of Birth: 1946/04/28   Medicare Important Message Given:     Patient left prior to IM delivery. Im mailed to the patient home address.    Shawnte Winton 11/15/2020, 3:13 PM

## 2020-11-15 NOTE — Progress Notes (Signed)
Pt discharged to home via daughter.  Understands all discharge orders including no driving for 6 months.  She has all belongings with her including her cell phone, charger, glasses and jewelry. All questions answered.

## 2020-11-15 NOTE — Progress Notes (Signed)
LTM EEG discontinued - pt had skin redness/small red blister at f7, f8 and t6

## 2020-11-15 NOTE — Discharge Instructions (Addendum)
Grace Schmitt was admitted to [facility] EMU from 11/11/2020 to 11/15/2020.  During this period she underwent video EEG monitoring which showed small sharp spikes in right anterior temporal region, predominantly during sleep.  We also recorded 1 a few episodes on 11/12/2020 during which patient reported feeling lightheaded/as if she were going to pass out followed by an urge to urinate.  During this episode, however heart rate increased from a baseline of 75 to 115 within few seconds.  Given the semiology of the episodes, it is possible that the small sharp spikes in her right anterior temporal region are epileptic with a deep epileptic focus.  We discussed switching patient from topiramate to zonisamide as she reported side effects of paresthesias due to topiramate.  However, patient and her daughter requested to keep her off antiseizure medications for few months to see how frequent these episodes are.  Given the brief nature of these episodes and absence of progression to generalized tonic-clonic seizures, I agreed to keep her off Topamax/zonisamide at this point.  I would recommend contacting patient's cardiologist for a 30-day event monitor to capture these events and assess if these are cardiac in origin (has had a 7-day monitor in the past during which she did not have any events).  If at any point, these episodes become more frequent, patient can contact me for repeat admission and characterization of these episodes.  Also, if these episodes become more frequent we can consider starting her on zonisamide 100 mg nightly.  Seizure precautions including do not drive.

## 2020-11-15 NOTE — Procedures (Addendum)
Patient Name:Grace Schmitt ELF:[id number] Epilepsy Attending:Ples Trudel [name] Referring Physician/Provider:Dr Amie Portland Duration:11/18/20210825 to11/19/2021 1010  Patient history:74 year old female with episodes of transient alteration of awareness with urinary incontinence. EEG to evaluate for seizures.  Level of alertness:Awake, asleep  AEDs during EEG study:None  Technical aspects: This EEG study was done with scalp electrodes positioned according to the 10-20 International system of electrode placement. Electrical activity was acquired at a sampling rate of 500Hz  and reviewed with a high frequency filter of 70Hz  and a low frequency filter of 1Hz . EEG data were recorded continuously and digitally stored.   Description: The posterior dominant rhythm consists of9Hz  activity of moderate voltage (25-35 uV) seen predominantly in posterior head regions, symmetric and reactive to eye opening and eye closing. Sleep was characterized by vertex waves, sleep spindles (12 to 14 Hz), maximal frontocentral region. Small sharp spikes were seen in right anterior temporal region, only during sleep.  IMPRESSION: This study showed small sharp spikes in right anterior temporal region which are not definitively epileptic but the field of the spike as well as patient's seizure semiology suggests possibility of a deep epileptic focus.  No seizures were seen throughout the recording.  Chayton Murata [name]

## 2020-11-18 ENCOUNTER — Telehealth: Payer: Self-pay | Admitting: Cardiology

## 2020-11-18 DIAGNOSIS — R55 Syncope and collapse: Secondary | ICD-10-CM

## 2020-11-18 DIAGNOSIS — R42 Dizziness and giddiness: Secondary | ICD-10-CM

## 2020-11-18 NOTE — Telephone Encounter (Signed)
Patient states she was seen in the hospital last week (11/16) for seizure like activity. Patient states that the doctor at the hospital suggested she see her cardiologist and wear a 30 day event monitor. Notes are in discharge summary - no orders for monitor in system. Please call/advise  Thank you!

## 2020-11-18 NOTE — Telephone Encounter (Signed)
That is okay with me.  Please make sure that the report comes to me so I can review it and give my recommendations as I okayed it.

## 2020-11-18 NOTE — Telephone Encounter (Signed)
Spoke with the pt and she agrees to wearing a 30 day event monitor.   Will place order and forward to [facility] for further assistance in sending it to the pt.

## 2020-11-18 NOTE — Telephone Encounter (Signed)
Pt last saw Dr. Geraldo Pitter 01/2019 and has since been in the hospital for seizures... 11/11/20-11/15/20.   She was treated by Neurology and at D/C it was suggested that she wears a 30 day event monitor:  Per Dr. Hortense Ramal: (See D/C Summary)   We also recorded 1 a few episodes on 11/12/2020 during which patient reported feeling lightheaded/as if she were going to pass out followed by an urge to urinate.  During this episode, however heart rate increased from a baseline of 75 to 115 within few seconds.     I would recommend contacting patient's cardiologist for a 30-day event monitor to capture these events and assess if these are cardiac in origin (has had a 7-day monitor in the past during which she did not have any events).    Pt does not have Cardio follow up.   Will need to forward to Dr. Geraldo Pitter for review and possible orders.

## 2020-11-25 ENCOUNTER — Telehealth: Payer: Self-pay | Admitting: Cardiology

## 2020-11-25 NOTE — Telephone Encounter (Signed)
New message:    Patient calling to check the status of the device that was order for her.

## 2020-11-27 NOTE — Telephone Encounter (Signed)
Registered pt for 30 day event monitor and called pt to let her know it should be coming in the mail in the next few days. Let pt know that if she needs any help applying monitor to call the [employer] office and see if someone there can help her. She verbalized understanding.

## 2020-11-30 ENCOUNTER — Ambulatory Visit (INDEPENDENT_AMBULATORY_CARE_PROVIDER_SITE_OTHER): Payer: PPO

## 2020-11-30 DIAGNOSIS — R55 Syncope and collapse: Secondary | ICD-10-CM | POA: Diagnosis not present

## 2020-11-30 DIAGNOSIS — R42 Dizziness and giddiness: Secondary | ICD-10-CM | POA: Diagnosis not present

## 2020-12-02 ENCOUNTER — Telehealth: Payer: Self-pay | Admitting: Cardiology

## 2020-12-02 NOTE — Telephone Encounter (Signed)
Pt states that she has been having these spells and was wanting to know if the monitor showed anything. I explained that the company will notify us if something shows up. Pt advised to go to the ED for new problems.

## 2020-12-02 NOTE — Telephone Encounter (Signed)
Pt called in stated that she just rec'd a heart monitor Saturday and feels like she has had a few episodes.    Pt will get the feeling of passing out be never does.  She breaks out in sweats.  She loses control of her bladder.  Her bp goes up and her hr goes up.  She stated she had 3 yesterday and 4 today.  She stated this is happening every couple of hours.  She would like to let someone know she was having these and to check her hear monitor . fyi This was the reason for the monitor to catch these episodes   Best number [phone]

## 2020-12-03 LAB — HM DIABETES FOOT EXAM: HM Diabetic Foot Exam: NORMAL

## 2020-12-04 ENCOUNTER — Ambulatory Visit (INDEPENDENT_AMBULATORY_CARE_PROVIDER_SITE_OTHER): Payer: PPO

## 2020-12-04 VITALS — Ht 63.0 in | Wt 178.0 lb

## 2020-12-04 DIAGNOSIS — Z0000 Encounter for general adult medical examination without abnormal findings: Secondary | ICD-10-CM | POA: Diagnosis not present

## 2020-12-04 DIAGNOSIS — Z780 Asymptomatic menopausal state: Secondary | ICD-10-CM

## 2020-12-04 DIAGNOSIS — Z1231 Encounter for screening mammogram for malignant neoplasm of breast: Secondary | ICD-10-CM | POA: Diagnosis not present

## 2020-12-04 NOTE — Patient Instructions (Signed)
Grace Schmitt , Thank you for taking time to complete your Medicare Wellness Visit. I appreciate your ongoing commitment to your health goals. Please review the following plan we discussed and let me know if I can assist you in the future.   Screening recommendations/referrals: Colonoscopy: Completed 04/29/2016-Due 04/29/2026 Mammogram: Ordered today. Someone will be calling you to schedule Bone Density: Ordered today. Someone will be calling you to schedule Recommended yearly ophthalmology/optometry visit for glaucoma screening and checkup Recommended yearly dental visit for hygiene and checkup  Vaccinations: Influenza vaccine: Up to date Pneumococcal vaccine: Completed vaccines Tdap vaccine: Up to date-Due-05/04/2021 Shingles vaccine: Discuss with pharmacy   Covid-19:completed vaccines  Advanced directives: Please bring a copy for your chart  Conditions/risks identified: See problem list  Next appointment: Follow up in one year for your annual wellness visit 12/10/21 @ 2;15   Preventive Care 65 Years and Older, Female Preventive care refers to lifestyle choices and visits with your health care provider that can promote health and wellness. What does preventive care include?  A yearly physical exam. This is also called an annual well check.  Dental exams once or twice a year.  Routine eye exams. Ask your health care provider how often you should have your eyes checked.  Personal lifestyle choices, including:  Daily care of your teeth and gums.  Regular physical activity.  Eating a healthy diet.  Avoiding tobacco and drug use.  Limiting alcohol use.  Practicing safe sex.  Taking low-dose aspirin every day.  Taking vitamin and mineral supplements as recommended by your health care provider. What happens during an annual well check? The services and screenings done by your health care provider during your annual well check will depend on your age, overall health, lifestyle  risk factors, and family history of disease. Counseling  Your health care provider may ask you questions about your:  Alcohol use.  Tobacco use.  Drug use.  Emotional well-being.  Home and relationship well-being.  Sexual activity.  Eating habits.  History of falls.  Memory and ability to understand (cognition).  Work and work Statistician.  Reproductive health. Screening  You may have the following tests or measurements:  Height, weight, and BMI.  Blood pressure.  Lipid and cholesterol levels. These may be checked every 5 years, or more frequently if you are over 92 years old.  Skin check.  Lung cancer screening. You may have this screening every year starting at age 22 if you have a 30-pack-year history of smoking and currently smoke or have quit within the past 15 years.  Fecal occult blood test (FOBT) of the stool. You may have this test every year starting at age 17.  Flexible sigmoidoscopy or colonoscopy. You may have a sigmoidoscopy every 5 years or a colonoscopy every 10 years starting at age 3.  Hepatitis C blood test.  Hepatitis B blood test.  Sexually transmitted disease (STD) testing.  Diabetes screening. This is done by checking your blood sugar (glucose) after you have not eaten for a while (fasting). You may have this done every 1-3 years.  Bone density scan. This is done to screen for osteoporosis. You may have this done starting at age 27.  Mammogram. This may be done every 1-2 years. Talk to your health care provider about how often you should have regular mammograms. Talk with your health care provider about your test results, treatment options, and if necessary, the need for more tests. Vaccines  Your health care provider may recommend certain  vaccines, such as:  Influenza vaccine. This is recommended every year.  Tetanus, diphtheria, and acellular pertussis (Tdap, Td) vaccine. You may need a Td booster every 10 years.  Zoster vaccine.  You may need this after age 77.  Pneumococcal 13-valent conjugate (PCV13) vaccine. One dose is recommended after age 13.  Pneumococcal polysaccharide (PPSV23) vaccine. One dose is recommended after age 13. Talk to your health care provider about which screenings and vaccines you need and how often you need them. This information is not intended to replace advice given to you by your health care provider. Make sure you discuss any questions you have with your health care provider. Document Released: 01/10/2016 Document Revised: 09/02/2016 Document Reviewed: 10/15/2015 Elsevier Interactive Patient Education  2017 [place] Prevention in the Home Falls can cause injuries. They can happen to people of all ages. There are many things you can do to make your home safe and to help prevent falls. What can I do on the outside of my home?  Regularly fix the edges of walkways and driveways and fix any cracks.  Remove anything that might make you trip as you walk through a door, such as a raised step or threshold.  Trim any bushes or trees on the path to your home.  Use bright outdoor lighting.  Clear any walking paths of anything that might make someone trip, such as rocks or tools.  Regularly check to see if handrails are loose or broken. Make sure that both sides of any steps have handrails.  Any raised decks and porches should have guardrails on the edges.  Have any leaves, snow, or ice cleared regularly.  Use sand or salt on walking paths during winter.  Clean up any spills in your garage right away. This includes oil or grease spills. What can I do in the bathroom?  Use night lights.  Install grab bars by the toilet and in the tub and shower. Do not use towel bars as grab bars.  Use non-skid mats or decals in the tub or shower.  If you need to sit down in the shower, use a plastic, non-slip stool.  Keep the floor dry. Clean up any water that spills on the floor as soon  as it happens.  Remove soap buildup in the tub or shower regularly.  Attach bath mats securely with double-sided non-slip rug tape.  Do not have throw rugs and other things on the floor that can make you trip. What can I do in the bedroom?  Use night lights.  Make sure that you have a light by your bed that is easy to reach.  Do not use any sheets or blankets that are too big for your bed. They should not hang down onto the floor.  Have a firm chair that has side arms. You can use this for support while you get dressed.  Do not have throw rugs and other things on the floor that can make you trip. What can I do in the kitchen?  Clean up any spills right away.  Avoid walking on wet floors.  Keep items that you use a lot in easy-to-reach places.  If you need to reach something above you, use a strong step stool that has a grab bar.  Keep electrical cords out of the way.  Do not use floor polish or wax that makes floors slippery. If you must use wax, use non-skid floor wax.  Do not have throw rugs and other things  on the floor that can make you trip. What can I do with my stairs?  Do not leave any items on the stairs.  Make sure that there are handrails on both sides of the stairs and use them. Fix handrails that are broken or loose. Make sure that handrails are as long as the stairways.  Check any carpeting to make sure that it is firmly attached to the stairs. Fix any carpet that is loose or worn.  Avoid having throw rugs at the top or bottom of the stairs. If you do have throw rugs, attach them to the floor with carpet tape.  Make sure that you have a light switch at the top of the stairs and the bottom of the stairs. If you do not have them, ask someone to add them for you. What else can I do to help prevent falls?  Wear shoes that:  Do not have high heels.  Have rubber bottoms.  Are comfortable and fit you well.  Are closed at the toe. Do not wear sandals.  If  you use a stepladder:  Make sure that it is fully opened. Do not climb a closed stepladder.  Make sure that both sides of the stepladder are locked into place.  Ask someone to hold it for you, if possible.  Clearly mark and make sure that you can see:  Any grab bars or handrails.  First and last steps.  Where the edge of each step is.  Use tools that help you move around (mobility aids) if they are needed. These include:  Canes.  Walkers.  Scooters.  Crutches.  Turn on the lights when you go into a dark area. Replace any light bulbs as soon as they burn out.  Set up your furniture so you have a clear path. Avoid moving your furniture around.  If any of your floors are uneven, fix them.  If there are any pets around you, be aware of where they are.  Review your medicines with your doctor. Some medicines can make you feel dizzy. This can increase your chance of falling. Ask your doctor what other things that you can do to help prevent falls. This information is not intended to replace advice given to you by your health care provider. Make sure you discuss any questions you have with your health care provider. Document Released: 10/10/2009 Document Revised: 05/21/2016 Document Reviewed: 01/18/2015 Elsevier Interactive Patient Education  2017 [employer].
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
## 2020-12-05 ENCOUNTER — Telehealth: Payer: Self-pay | Admitting: Neurology

## 2020-12-05 ENCOUNTER — Inpatient Hospital Stay (HOSPITAL_COMMUNITY)
Admission: EM | Admit: 2020-12-05 | Discharge: 2020-12-08 | DRG: 884 | Disposition: A | Discharge status: Home / Self Care | Payer: PPO | Attending: Family Medicine | Admitting: Family Medicine

## 2020-12-05 ENCOUNTER — Other Ambulatory Visit: Payer: Self-pay

## 2020-12-05 ENCOUNTER — Observation Stay (HOSPITAL_COMMUNITY): Payer: PPO

## 2020-12-05 ENCOUNTER — Encounter (HOSPITAL_COMMUNITY): Payer: Self-pay

## 2020-12-05 ENCOUNTER — Telehealth: Payer: Self-pay | Admitting: Cardiology

## 2020-12-05 DIAGNOSIS — F32A Depression, unspecified: Secondary | ICD-10-CM | POA: Diagnosis present

## 2020-12-05 DIAGNOSIS — Z7982 Long term (current) use of aspirin: Secondary | ICD-10-CM

## 2020-12-05 DIAGNOSIS — D860 Sarcoidosis of lung: Secondary | ICD-10-CM | POA: Diagnosis present

## 2020-12-05 DIAGNOSIS — Z888 Allergy status to other drugs, medicaments and biological substances status: Secondary | ICD-10-CM

## 2020-12-05 DIAGNOSIS — Z8249 Family history of ischemic heart disease and other diseases of the circulatory system: Secondary | ICD-10-CM

## 2020-12-05 DIAGNOSIS — Z79899 Other long term (current) drug therapy: Secondary | ICD-10-CM | POA: Diagnosis not present

## 2020-12-05 DIAGNOSIS — Z20822 Contact with and (suspected) exposure to covid-19: Secondary | ICD-10-CM | POA: Diagnosis present

## 2020-12-05 DIAGNOSIS — E785 Hyperlipidemia, unspecified: Secondary | ICD-10-CM | POA: Diagnosis present

## 2020-12-05 DIAGNOSIS — H9012 Conductive hearing loss, unilateral, left ear, with unrestricted hearing on the contralateral side: Secondary | ICD-10-CM | POA: Diagnosis present

## 2020-12-05 DIAGNOSIS — R569 Unspecified convulsions: Secondary | ICD-10-CM | POA: Diagnosis present

## 2020-12-05 DIAGNOSIS — G2581 Restless legs syndrome: Secondary | ICD-10-CM | POA: Diagnosis present

## 2020-12-05 DIAGNOSIS — G40909 Epilepsy, unspecified, not intractable, without status epilepticus: Secondary | ICD-10-CM | POA: Diagnosis not present

## 2020-12-05 DIAGNOSIS — Z83438 Family history of other disorder of lipoprotein metabolism and other lipidemia: Secondary | ICD-10-CM

## 2020-12-05 DIAGNOSIS — I10 Essential (primary) hypertension: Secondary | ICD-10-CM | POA: Diagnosis present

## 2020-12-05 DIAGNOSIS — E039 Hypothyroidism, unspecified: Secondary | ICD-10-CM | POA: Diagnosis present

## 2020-12-05 DIAGNOSIS — Z79891 Long term (current) use of opiate analgesic: Secondary | ICD-10-CM

## 2020-12-05 DIAGNOSIS — R404 Transient alteration of awareness: Secondary | ICD-10-CM | POA: Diagnosis present

## 2020-12-05 DIAGNOSIS — M797 Fibromyalgia: Secondary | ICD-10-CM | POA: Diagnosis present

## 2020-12-05 DIAGNOSIS — R32 Unspecified urinary incontinence: Secondary | ICD-10-CM | POA: Diagnosis present

## 2020-12-05 DIAGNOSIS — Z79890 Hormone replacement therapy (postmenopausal): Secondary | ICD-10-CM | POA: Diagnosis not present

## 2020-12-05 DIAGNOSIS — R000 Tachycardia, unspecified: Secondary | ICD-10-CM | POA: Diagnosis not present

## 2020-12-05 DIAGNOSIS — I6789 Other cerebrovascular disease: Secondary | ICD-10-CM | POA: Diagnosis present

## 2020-12-05 DIAGNOSIS — G4733 Obstructive sleep apnea (adult) (pediatric): Secondary | ICD-10-CM | POA: Diagnosis present

## 2020-12-05 HISTORY — DX: Epilepsy, unspecified, not intractable, without status epilepticus: G40.909

## 2020-12-05 LAB — CBC
HCT: 45 % (ref 36.0–46.0)
Hemoglobin: 14.1 g/dL (ref 12.0–15.0)
MCH: 30.5 pg (ref 26.0–34.0)
MCHC: 31.3 g/dL (ref 30.0–36.0)
MCV: 97.2 fL (ref 80.0–100.0)
Platelets: 346 10*3/uL (ref 150–400)
RBC: 4.63 MIL/uL (ref 3.87–5.11)
RDW: 13 % (ref 11.5–15.5)
WBC: 11.5 10*3/uL — ABNORMAL HIGH (ref 4.0–10.5)
nRBC: 0 % (ref 0.0–0.2)

## 2020-12-05 LAB — BASIC METABOLIC PANEL
Anion gap: 9 (ref 5–15)
BUN: 14 mg/dL (ref 8–23)
CO2: 28 mmol/L (ref 22–32)
Calcium: 9.4 mg/dL (ref 8.9–10.3)
Chloride: 99 mmol/L (ref 98–111)
Creatinine, Ser: 0.85 mg/dL (ref 0.44–1.00)
GFR, Estimated: >60 mL/min (ref >60)
Glucose, Bld: 118 mg/dL — ABNORMAL HIGH (ref 70–99)
Potassium: 5.1 mmol/L (ref 3.5–5.1)
Sodium: 136 mmol/L (ref 135–145)

## 2020-12-05 LAB — TROPONIN I (HIGH SENSITIVITY): Troponin I (High Sensitivity): 4 ng/L (ref <18)

## 2020-12-05 LAB — RESP PANEL BY RT-PCR (FLU A&B, COVID) ARPGX2
Influenza A by PCR: NEGATIVE
Influenza B by PCR: NEGATIVE
SARS Coronavirus 2 by RT PCR: NEGATIVE

## 2020-12-05 MED ORDER — SODIUM CHLORIDE 0.9 % IV SOLN: 250.0000 mL | INTRAVENOUS | Status: DC | PRN

## 2020-12-05 MED ORDER — LORAZEPAM 2 MG/ML IJ SOLN: 2.0000 mg | INTRAMUSCULAR | Status: DC | PRN

## 2020-12-05 MED ORDER — ACETAMINOPHEN 650 MG RE SUPP: 650.0000 mg | Freq: Four times a day (QID) | RECTAL | Status: DC | PRN

## 2020-12-05 MED ORDER — ONDANSETRON HCL 4 MG/2ML IJ SOLN: 4.0000 mg | Freq: Four times a day (QID) | INTRAMUSCULAR | Status: DC | PRN

## 2020-12-05 MED ORDER — ACETAMINOPHEN 325 MG PO TABS
650.0000 mg | ORAL_TABLET | Freq: Four times a day (QID) | ORAL | Status: DC | PRN
  Filled 2020-12-05: qty 2

## 2020-12-05 MED ORDER — POLYETHYLENE GLYCOL 3350 17 G PO PACK: 17.0000 g | PACK | Freq: Every day | ORAL | Status: DC | PRN

## 2020-12-05 MED ORDER — ONDANSETRON HCL 4 MG PO TABS: 4.0000 mg | ORAL_TABLET | Freq: Four times a day (QID) | ORAL | Status: DC | PRN

## 2020-12-05 MED ORDER — SODIUM CHLORIDE 0.9% FLUSH
3.0000 mL | Freq: Two times a day (BID) | INTRAVENOUS | Status: DC
  Administered 2020-12-05 – 2020-12-07 (×5): 3 mL via INTRAVENOUS

## 2020-12-05 MED ORDER — SODIUM CHLORIDE 0.9% FLUSH
3.0000 mL | INTRAVENOUS | Status: DC | PRN
  Administered 2020-12-08: 3 mL via INTRAVENOUS

## 2020-12-05 NOTE — Telephone Encounter (Signed)
Patient says she may have had multiple seizures over the period of time she has been wearing her heart monitor. She wanted to know if there was anything that has showing up on the monitor results. She is not sure if the spells have been heart related or not. If they are not heart related she needs to get the proper medication
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
## 2020-12-05 NOTE — ED Notes (Signed)
Assigned bed ready. Attempted to call report. States they are unsure if pt has been approved. Asked for this RN to call back in 5-10 min.

## 2020-12-05 NOTE — Progress Notes (Signed)
LTM EEG hooked up and running - no initial skin breakdown - push button tested - neuro notified.  

## 2020-12-05 NOTE — ED Notes (Signed)
Attempted to call report. Inpt RN will call back when ready

## 2020-12-05 NOTE — Telephone Encounter (Signed)
Ms. Cavanaugh called to notify that she has had multiple episodes of alteration of awareness in the last 3 to 4 days.  She called her cardiologist's office to see if anything was captured on the Holter monitor but was told she will get the results of the Holter monitor at the end of 30 days and they are unable to notify her about the results unless they see something life-threatening before that.  I discussed coming to the ER for admission to characterize the spells (we were able to capture only one very brief spell last time) or start zonisamide.  Ms. Barga would prefer characterization of the spells if possible before starting medication.  I advised patient to come to the ED and notify me when she is here to get her evaluated and admitted for video EEG monitoring.   Taeja Debellis [name]
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
## 2020-12-05 NOTE — ED Triage Notes (Addendum)
Pt reports she is here today due to seizure like activity over a couple of months.Pt reports she is wearing a heart monitor to rule out that its her heart. She states she has these spells where she goes "out of it"the patient reports she was sent here for EEG monitoring.

## 2020-12-05 NOTE — Telephone Encounter (Signed)
Spoke with patient and advised her that she needs to discuss her seizures with her PCP. Pt advised that we have not received any data from her monitor and may not until it is completed.Pt verbalized understanding and had no additional comments.

## 2020-12-06 DIAGNOSIS — G40909 Epilepsy, unspecified, not intractable, without status epilepticus: Secondary | ICD-10-CM | POA: Diagnosis not present

## 2020-12-06 DIAGNOSIS — G2581 Restless legs syndrome: Secondary | ICD-10-CM | POA: Diagnosis not present

## 2020-12-06 DIAGNOSIS — E039 Hypothyroidism, unspecified: Secondary | ICD-10-CM | POA: Diagnosis not present

## 2020-12-06 DIAGNOSIS — R404 Transient alteration of awareness: Secondary | ICD-10-CM | POA: Diagnosis not present

## 2020-12-06 MED ORDER — METHADONE HCL 5 MG PO TABS
5.0000 mg | ORAL_TABLET | Freq: Two times a day (BID) | ORAL | Status: DC
  Administered 2020-12-06 – 2020-12-07 (×4): 5 mg via ORAL
  Filled 2020-12-06 (×4): qty 1

## 2020-12-06 MED ORDER — ASPIRIN EC 81 MG PO TBEC
81.0000 mg | DELAYED_RELEASE_TABLET | Freq: Every day | ORAL | Status: DC
  Administered 2020-12-06 – 2020-12-08 (×3): 81 mg via ORAL
  Filled 2020-12-06 (×3): qty 1

## 2020-12-06 MED ORDER — METHADONE HCL 5 MG PO TABS: 5.0000 mg | ORAL_TABLET | ORAL | Status: DC

## 2020-12-06 MED ORDER — DULOXETINE HCL 60 MG PO CPEP
60.0000 mg | ORAL_CAPSULE | Freq: Every day | ORAL | Status: DC
  Administered 2020-12-06 – 2020-12-08 (×3): 60 mg via ORAL
  Filled 2020-12-06 (×3): qty 1

## 2020-12-06 MED ORDER — GABAPENTIN 300 MG PO CAPS
300.0000 mg | ORAL_CAPSULE | Freq: Every day | ORAL | Status: DC
  Administered 2020-12-06 – 2020-12-08 (×3): 300 mg via ORAL
  Filled 2020-12-06 (×3): qty 1

## 2020-12-06 MED ORDER — LEVOTHYROXINE SODIUM 50 MCG PO TABS
50.0000 ug | ORAL_TABLET | Freq: Every day | ORAL | Status: DC
  Administered 2020-12-07 – 2020-12-08 (×2): 50 ug via ORAL
  Filled 2020-12-06 (×2): qty 1

## 2020-12-06 NOTE — Progress Notes (Signed)
PROGRESS NOTE  Grace Schmitt DOB: 02-14-1946 DOA: 12/05/2020 PCP: Ma Hillock, DO  Brief History   74 year old woman admitted for long-term video EEG monitoring for further evaluation of multiple episodes of alteration of awareness over the last few days, cyclic in nature.   A & P  Episodes of diaphoresis, head discomfort, tachycardia and hypertension, transient alteration of awarness --Followed by cardiology as an outpatient, currently has a 30-day monitor in progress --Admitted as per neurology for video EEG to evaluate for possible seizures --Continue telemetry.  Thus far sinus rhythm.  EKG was unrevealing. --Urine for metanephrines  Restless leg syndrome --Reportedly severe, patient on multiple previous medications by report, currently takes methadone at 2 PM and 6 PM which has controlled symptoms --Continue methadone  Hypothyroidism --Check TSH, continue levothyroxine  Fibromyalgia --Continue Cymbalta, gabapentin Disposition Plan:  Discussion: as above  Status is: Observation   Dispo: The patient is from: Home              Anticipated d/c is to: Home              Anticipated d/c date is: 2 days              Patient currently is not medically stable to d/c.  DVT prophylaxis:    Code Status: Full Code Family Communication: none  Murray Hodgkins, MD  [facility] Direct contact: see [email or web address] (further directions at bottom of note if needed) 7PM-7AM contact night coverage as at bottom of note 12/06/2020, 5:34 PM  LOS: 0 days   Significant Hospital Events   .    Consults:  .    Procedures:  .   Significant Diagnostic Tests:  Marland Kitchen    Micro Data:  .    Antimicrobials:  .   Interval History/Subjective  CC: f/u spells  Feels okay now, no spells overnight.  Reports cyclic spells as an outpatient, never loss of consciousness, develops a fullness in her head, tachycardia, hypertension, will occur for several days and then be  quiescent for weeks.  Followed by cardiology as an outpatient.  Reports restless leg syndrome, takes methadone for this.  Objective   Vitals:  Vitals:   12/06/20 1130 12/06/20 1604  BP: 125/72 137/83  Pulse: 73 69  Resp: 18 18  Temp: 98.1 F (36.7 C)   SpO2: 100% 95%    Exam:  Constitutional:   . Appears calm and comfortable ENMT:  . grossly normal hearing  Respiratory:  . CTA bilaterally, no w/r/r.  . Respiratory effort normal.  Cardiovascular:  . RRR, no m/r/g Psychiatric:  . Mental status o Mood, affect appropriate  I have personally reviewed the following:   Today's Data  . Potassium 5.1, remainder BMP unremarkable . CBC unremarkable  Scheduled Meds: . aspirin EC  81 mg Oral Daily  . DULoxetine  60 mg Oral Daily  . gabapentin  300 mg Oral Daily  . [START ON 12/07/2020] levothyroxine  50 mcg Oral Q0600  . methadone  5 mg Oral BID  . sodium chloride flush  3 mL Intravenous Q12H   Continuous Infusions: . sodium chloride      Principal Problem:   Transient alteration of awareness Active Problems:   Hypothyroidism   Depression   Restless leg syndrome   Fibromyalgia   OSA (obstructive sleep apnea)   LOS: 0 days   How to contact the [facility] Attending or Consulting provider 7A - 7P or covering provider during after hours [place], for  this patient?  1. Check the care team in [facility] and look for a) attending/consulting TRH provider listed and b) the [facility] team listed 2. Log into [email or web address] and use 's universal password to access. If you do not have the password, please contact the hospital operator. 3. Locate the [facility] provider you are looking for under [facility] and page to a number that you can be directly reached. 4. If you still have difficulty reaching the provider, please page the [facility] (Director on Call) for the Hospitalists listed on amion for assistance.

## 2020-12-06 NOTE — Hospital Course (Signed)
74 year old woman admitted for long-term video EEG monitoring for further evaluation of multiple episodes of alteration of awareness over the last few days, cyclic in nature.

## 2020-12-06 NOTE — Progress Notes (Signed)
vLTM EEG maint complete. No skin breakdown at Fp1 Fp2 P4 continue to monitor

## 2020-12-06 NOTE — Progress Notes (Signed)
Subjective: No events overnight.   ROS: negative except above  Examination  Vital signs in last 24 hours: Temp:  [98.1 F (36.7 C)-98.9 F (37.2 C)] 98.1 F (36.7 C) (12/10 0742) Pulse Rate:  [70-100] 81 (12/10 0742) Resp:  [10-18] 16 (12/10 0742) BP: (111-166)/(62-95) 121/65 (12/10 0742) SpO2:  [90 %-99 %] 95 % (12/10 0742) Weight:  [80.3 kg] 80.3 kg (12/09 1055)  General: lying in bed, not in apparent distress CVS: pulse-normal rate and rhythm RS: breathing comfortably Extremities: normal, warm  Neuro: AOx3, cranial nerves II to XII grossly intact, spontaneously moving all 4 extremities in bed  Basic Metabolic Panel: Recent Labs  Lab 12/05/20 1100  NA 136  K 5.1  CL 99  CO2 28  GLUCOSE 118*  BUN 14  CREATININE 0.85  CALCIUM 9.4    CBC: Recent Labs  Lab 12/05/20 1100  WBC 11.5*  HGB 14.1  HCT 45.0  MCV 97.2  PLT 346     Coagulation Studies: No results for input(s): LABPROT, INR in the last 72 hours.  Imaging No new brain imaging   ASSESSMENT AND PLAN: 44 old female with episodes of transient alteration of awareness.  Transient alteration of awareness -Differentials include epilepsy versus cardiogenic versus dysautonomia  Recommendations: -Video EEG monitoring for characterization of spells -Patient currently not on any AEDs -Resume rest of the home medications per primary team -Seizure precautions -As needed IV Ativan 2 mg for seizure lasting more than 2 minutes   I have spent a total of  25 minutes with the patient reviewing hospital notes,  test results, labs and examining the patient as well as establishing an assessment and plan that was discussed personally with the patient and husband at bedside.  > 50% of time was spent in direct patient care.         Zeb Comfort Epilepsy [facility] For questions after 5pm please refer to AMION to reach the Neurologist on call

## 2020-12-06 NOTE — Procedures (Addendum)
Patient Name:Grace Schmitt PYY:[id number] Epilepsy Attending:Shastina Rua [name] Referring Physician/Provider:Dr Bonnell Public Duration:12/9/20211446 to12/08/2020 2045, 12/06/2020 0852 to 12/07/2020 0300  Patient history:74 year old female with episodes of transient alteration of awareness with urinary incontinence. EEG to evaluate for seizures.  Level of alertness:Awake, asleep  AEDs during EEG study:None  Technical aspects: This EEG study was done with scalp electrodes positioned according to the 10-20 International system of electrode placement. Electrical activity was acquired at a sampling rate of 500Hz  and reviewed with a high frequency filter of 70Hz  and a low frequency filter of 1Hz . EEG data were recorded continuously and digitally stored.   Description: The posterior dominant rhythm consists of9Hz  activity of moderate voltage (25-35 uV) seen predominantly in posterior head regions, symmetric and reactive to eye opening and eye closing. Sleep was characterized by vertex waves, sleep spindles (12-14hz ), maximal frontocentral region.   IMPRESSION: This study is within normal limits.  No seizures and epileptiform discharges were seen throughout the recording.  Deisha Stull [name]

## 2020-12-06 NOTE — Progress Notes (Addendum)
Received a call from EEG monitoring room that they were unable to see the patient on their end. The patient was never hooked back up to the EEG monitoring. The night nurse(Courtney) called last night but no EEG Tech came up and neurology was informed. Called EEG tech number and did not get a response. This nurse left a voice message to EEG department.  854: Called EEG department again. Was notified that someone was on their way up to connect the patient back to monitor.

## 2020-12-06 NOTE — Progress Notes (Signed)
Pt Grace Schmitt here for observation and EEG monitoring. EEG tech never showed and Neurology MD for night was notified of situation @ 0200. Also left a message with EEG department.   Pt desires to go home if there is no placement of EEG monitoring.

## 2020-12-07 DIAGNOSIS — Z79890 Hormone replacement therapy (postmenopausal): Secondary | ICD-10-CM | POA: Diagnosis not present

## 2020-12-07 DIAGNOSIS — Z79891 Long term (current) use of opiate analgesic: Secondary | ICD-10-CM | POA: Diagnosis not present

## 2020-12-07 DIAGNOSIS — Z8249 Family history of ischemic heart disease and other diseases of the circulatory system: Secondary | ICD-10-CM | POA: Diagnosis not present

## 2020-12-07 DIAGNOSIS — Z83438 Family history of other disorder of lipoprotein metabolism and other lipidemia: Secondary | ICD-10-CM | POA: Diagnosis not present

## 2020-12-07 DIAGNOSIS — R569 Unspecified convulsions: Secondary | ICD-10-CM

## 2020-12-07 DIAGNOSIS — H9012 Conductive hearing loss, unilateral, left ear, with unrestricted hearing on the contralateral side: Secondary | ICD-10-CM | POA: Diagnosis present

## 2020-12-07 DIAGNOSIS — Z79899 Other long term (current) drug therapy: Secondary | ICD-10-CM | POA: Diagnosis not present

## 2020-12-07 DIAGNOSIS — G2581 Restless legs syndrome: Secondary | ICD-10-CM | POA: Diagnosis present

## 2020-12-07 DIAGNOSIS — R404 Transient alteration of awareness: Secondary | ICD-10-CM | POA: Diagnosis present

## 2020-12-07 DIAGNOSIS — Z888 Allergy status to other drugs, medicaments and biological substances status: Secondary | ICD-10-CM | POA: Diagnosis not present

## 2020-12-07 DIAGNOSIS — Z20822 Contact with and (suspected) exposure to covid-19: Secondary | ICD-10-CM | POA: Diagnosis present

## 2020-12-07 DIAGNOSIS — E039 Hypothyroidism, unspecified: Secondary | ICD-10-CM | POA: Diagnosis present

## 2020-12-07 DIAGNOSIS — D860 Sarcoidosis of lung: Secondary | ICD-10-CM | POA: Diagnosis present

## 2020-12-07 DIAGNOSIS — E785 Hyperlipidemia, unspecified: Secondary | ICD-10-CM | POA: Diagnosis present

## 2020-12-07 DIAGNOSIS — M797 Fibromyalgia: Secondary | ICD-10-CM | POA: Diagnosis present

## 2020-12-07 DIAGNOSIS — R32 Unspecified urinary incontinence: Secondary | ICD-10-CM | POA: Diagnosis present

## 2020-12-07 DIAGNOSIS — G4733 Obstructive sleep apnea (adult) (pediatric): Secondary | ICD-10-CM | POA: Diagnosis present

## 2020-12-07 DIAGNOSIS — Z7982 Long term (current) use of aspirin: Secondary | ICD-10-CM | POA: Diagnosis not present

## 2020-12-07 DIAGNOSIS — I10 Essential (primary) hypertension: Secondary | ICD-10-CM | POA: Diagnosis present

## 2020-12-07 DIAGNOSIS — F32A Depression, unspecified: Secondary | ICD-10-CM | POA: Diagnosis present

## 2020-12-07 HISTORY — DX: Unspecified convulsions: R56.9

## 2020-12-07 LAB — TSH: TSH: 4.643 u[IU]/mL — ABNORMAL HIGH (ref 0.350–4.500)

## 2020-12-07 NOTE — Discharge Summary (Signed)
Physician Discharge Summary  [name] VHQ:[id number] DOB: [birth year] DOA: 12/05/2020  PCP: Howard Pouch A, DO  Admit date: 12/05/2020 Discharge date: 12/07/2020  Recommendations for Outpatient Follow-up:  1. Transient spells with alteration of awareness. Urine sent for metanephrines and catecholamines, please follow-up results to rule out pheochromocytoma.    Follow-up Information    Kuneff, Renee A, DO Follow up.   Specialty: Family Medicine Why: As needed Contact information: [place] [address] [phone]        Lora Havens, MD Follow up.   Specialty: Neurology Why: as per Dr. Hortense Ramal. Contact information: [place] [address] [phone]                Discharge Diagnoses: Principal diagnosis is #1 Principal Problem:   Transient alteration of awareness Active Problems:   Hypothyroidism   Depression   Restless leg syndrome   Fibromyalgia   OSA (obstructive sleep apnea)   Seizure [facility])   Discharge Condition: improved Disposition: home  Diet recommendation:  Diet Orders (From admission, onward)    Start     Ordered   12/07/20 0000  Diet general        12/07/20 1745   12/05/20 1425  Diet regular Room service appropriate? Yes; Fluid consistency: Thin  Diet effective now       Question Answer Comment  Room service appropriate? Yes   Fluid consistency: Thin      12/05/20 1424           Filed Weights   12/05/20 1055  Weight: 80.3 kg    HPI/Hospital Course:   74 year old woman admitted for long-term video EEG monitoring for further evaluation of multiple episodes of alteration of awareness over the last few days, cyclic in nature.  Patient was admitted per neurology request for long-term video EEG.  This was unrevealing, EEG was discontinued by neurology and patient was cleared for discharge by neurology.  Individual issues as below.  Episodes of diaphoresis, head discomfort, tachycardia and  hypertension, transient alteration of awarness --Followed by cardiology as an outpatient, currently has Schmitt 30-day monitor in progress.  EKG and telemetry here unremarkable. --Admitted as per neurology for video EEG to evaluate for possible seizures, no spells, EEG unrevealing --Urine will be sent for for metanephrines and catecholamines --As per discussion with neurology 12/11, EEG stopped and patient stable for discharge  Restless leg syndrome --Reportedly severe, patient on multiple previous medications by report, currently takes methadone at 2 PM and 6 PM which has controlled symptoms --stable, continue methadone  Hypothyroidism --TSH trivial elevation, continue levothyroxine w/o change, f/u as an outpt  Fibromyalgia --continue Cymbalta, gabapentin  Consults:   Neurology   Significant Diagnostic Tests:   Video EEG   Today's assessment: o See progress note same day  Discharge Instructions  Discharge Instructions    Activity as tolerated - No restrictions   Complete by: As directed    Diet general   Complete by: As directed    Discharge instructions   Complete by: As directed    Call your physician or seek immediate medical attention for passing out, confusion, tremors or worsening of condition.     Allergies as of 12/07/2020      Reactions   Lipitor [atorvastatin Calcium]    Low dose caused arthralgia.   Phenergan [promethazine Hcl] Other (See Comments)   Pt has restless leg syndrome and the phenergan cause the RLS  to worsen    Statins  Cause severe myalgias      Medication List    TAKE these medications   acetaminophen 325 MG tablet Commonly known as: TYLENOL Tylenol 325 -2 tablets every 6 hours, I would take this continuously until your pain is resolved.  Do not take more than 4000 mg of Tylenol per day it can harm your liver.  You can buy this over-the-counter at any drugstore. What changed:   how much to take  how to take this  when to take  this  reasons to take this  additional instructions   aspirin EC 81 MG tablet Take 81 mg by mouth daily.   BENEFIBER PO Take 1 Scoop by mouth daily.   Biotin 5 MG Tabs Take 5 mg by mouth daily.   Calcium 500/D 500-200 MG-UNIT Tabs Generic drug: Calcium Carb-Cholecalciferol Take 1 tablet by mouth.   co-enzyme Q-10 30 MG capsule Take 30 mg by mouth daily.   docusate sodium 100 MG capsule Commonly known as: COLACE You can buy Colace over-the-counter at any drugstore.  Follow package directions.  Your aim is to have 1 or 2 soft bowel movements per day.  You can adjust the dose as needed. What changed:   how much to take  how to take this  when to take this  reasons to take this  additional instructions   DULoxetine 60 MG capsule Commonly known as: CYMBALTA Take 60 mg by mouth daily.   Flaxseed Oil 1200 MG Caps Take 1,200 mg by mouth daily.   fluticasone 50 MCG/ACT nasal spray Commonly known as: FLONASE Place 2 sprays into both nostrils as needed for allergies.   gabapentin 300 MG capsule Commonly known as: NEURONTIN Take 300 mg by mouth daily.   IRON (FERROUS SULFATE) PO Take 1 tablet by mouth daily.   KRILL OIL PO Take 1,000 mg by mouth daily.   levothyroxine 50 MCG tablet Commonly known as: SYNTHROID TAKE 1 TABLET (50 MCG TOTAL) BY MOUTH DAILY. What changed:   how much to take  how to take this  when to take this  additional instructions   loratadine 10 MG tablet Commonly known as: CLARITIN Take 10 mg by mouth at bedtime.   MAGNESIUM 27 PO Take 1,300 mg by mouth daily.   methadone 5 MG tablet Commonly known as: DOLOPHINE Take 5 mg by mouth 2 (two) times daily. For restless legs   multivitamin with minerals Tabs tablet Take 1 tablet by mouth 2 (two) times daily.   Repatha 140 MG/ML Sosy Generic drug: Evolocumab Inject 140 mg into the skin every 14 (fourteen) days.   vitamin C 500 MG tablet Commonly known as: ASCORBIC ACID Take  500 mg by mouth daily.   zinc gluconate 50 MG tablet Take 50 mg by mouth daily.      Allergies  Allergen Reactions  . Lipitor [Atorvastatin Calcium]     Low dose caused arthralgia.  Grace Schmitt [Promethazine Hcl] Other (See Comments)    Pt has restless leg syndrome and the phenergan cause the RLS  to worsen   . Statins     Cause severe myalgias    The results of significant diagnostics from this hospitalization (including imaging, microbiology, ancillary and laboratory) are listed below for reference.    Significant Diagnostic Studies: Overnight EEG with video  Result Date: 12/06/2020 Lora Havens, MD     12/07/2020  9:12 AM Patient Name:Grace Schmitt Pretty XLK:[id number] Epilepsy Attending:Priyanka [name] Referring Physician/Provider:Dr Bonnell Public Duration:12/9/20211446 to12/08/2020 2045, 12/06/2020  0852 to 12/07/2020 0300  Patient history:74 year old female with episodes of transient alteration of awareness with urinary incontinence. EEG to evaluate for seizures.  Level of alertness:Awake, asleep  AEDs during EEG study:None  Technical aspects: This EEG study was done with scalp electrodes positioned according to the 10-20 International system of electrode placement. Electrical activity was acquired at Schmitt sampling rate of 500Hz  and reviewed with Schmitt high frequency filter of 70Hz  and Schmitt low frequency filter of 1Hz . EEG data were recorded continuously and digitally stored.  Description: The posterior dominant rhythm consists of9Hz  activity of moderate voltage (25-35 uV) seen predominantly in posterior head regions, symmetric and reactive to eye opening and eye closing. Sleep was characterized by vertex waves, sleep spindles (12-14hz ), maximal frontocentral region.  IMPRESSION: This study is within normal limits.  No seizures and epileptiform discharges were seen throughout the recording.  Lora Havens  EEG Monitoring Adult EMU (24 hour with  video)  Result Date: 11/13/2020 Lora Havens, MD     11/13/2020 10:53 AM Patient Name: MEHGAN SANTMYER MRN: [id number] Epilepsy Attending: Lora Havens Referring Physician/Provider:  Dr Amie [place] Duration: 11/12/2020 0825 to 11/13/2020 0825 Patient history: 74 year old female with episodes of transient alteration of awareness with urinary incontinence.  EEG to evaluate for seizures. Level of alertness: Awake, asleep AEDs during EEG study: None Technical aspects: This EEG study was done with scalp electrodes positioned according to the 10-20 International system of electrode placement. Electrical activity was acquired at Schmitt sampling rate of 500Hz  and reviewed with Schmitt high frequency filter of 70Hz  and Schmitt low frequency filter of 1Hz . EEG data were recorded continuously and digitally stored. Description: The posterior dominant rhythm consists of 9 Hz activity of moderate voltage (25-35 uV) seen predominantly in posterior head regions, symmetric and reactive to eye opening and eye closing.  Sleep was characterized by vertex waves, sleep spindles (12 to 14 Hz), maximal frontocentral region. Sharp transients were seen in right temporal region. Event button was pressed on 11/12/2020 at 1333.  Patient reports feeling confused briefly followed by an urge to urinate.  Patient's heart rate did jump from Schmitt baseline of 75 to 115 Concomitant EEG did not show any EEG change to suggest seizure.   IMPRESSION: This study is within normal limits. No seizures or definite epileptiform discharges were seen throughout the recording. Event button was pressed on 11/12/2020 at 1333 during which she reported feeling confused followed by an urge to urinate without concomitant EEG change.  However, aura and focal sensory seizures may not always be seen on scalp EEG. Lora Havens    Microbiology: Recent Results (from the past 240 hour(s))  Resp Panel by RT-PCR (Flu Schmitt&B, Covid) Nasopharyngeal Swab     Status: None    Collection Time: 12/05/20 12:59 PM   Specimen: Nasopharyngeal Swab; Nasopharyngeal(NP) swabs in vial transport medium  Result Value Ref Range Status   SARS Coronavirus 2 by RT PCR NEGATIVE NEGATIVE Final    Comment: (NOTE) SARS-CoV-2 target nucleic acids are NOT DETECTED.  The SARS-CoV-2 RNA is generally detectable in upper respiratory specimens during the acute phase of infection. The lowest concentration of SARS-CoV-2 viral copies this assay can detect is 138 copies/mL. Schmitt negative result does not preclude SARS-Cov-2 infection and should not be used as the sole basis for treatment or other patient management decisions. Schmitt negative result may occur with  improper specimen collection/handling, submission of specimen other than nasopharyngeal swab, presence of viral mutation(s) within the areas targeted  by this assay, and inadequate number of viral copies(<138 copies/mL). Schmitt negative result must be combined with clinical observations, patient history, and epidemiological information. The expected result is Negative.  Fact Sheet for Patients:  EntrepreneurPulse.com.au  Fact Sheet for Healthcare Providers:  IncredibleEmployment.be  This test is no t yet approved or cleared by the [place] FDA and  has been authorized for detection and/or diagnosis of SARS-CoV-2 by FDA under an Emergency Use Authorization (EUA). This EUA will remain  in effect (meaning this test can be used) for the duration of the COVID-19 declaration under Section 564(b)(1) of the Act, 21 U.S.C.section 360bbb-3(b)(1), unless the authorization is terminated  or revoked sooner.       Influenza Schmitt by PCR NEGATIVE NEGATIVE Final   Influenza B by PCR NEGATIVE NEGATIVE Final    Comment: (NOTE) The Xpert Xpress SARS-CoV-2/FLU/RSV plus assay is intended as an aid in the diagnosis of influenza from Nasopharyngeal swab specimens and should not be used as Schmitt sole basis for treatment.  Nasal washings and aspirates are unacceptable for Xpert Xpress SARS-CoV-2/FLU/RSV testing.  Fact Sheet for Patients: EntrepreneurPulse.com.au  Fact Sheet for Healthcare Providers: IncredibleEmployment.be  This test is not yet approved or cleared by the [place] FDA and has been authorized for detection and/or diagnosis of SARS-CoV-2 by FDA under an Emergency Use Authorization (EUA). This EUA will remain in effect (meaning this test can be used) for the duration of the COVID-19 declaration under Section 564(b)(1) of the Act, 21 U.S.C. section 360bbb-3(b)(1), unless the authorization is terminated or revoked.  Performed at [facility], [place] [address] [place], [place] [address]      Labs: Basic Metabolic Panel: Recent Labs  Lab 12/05/20 1100  NA 136  K 5.1  CL 99  CO2 28  GLUCOSE 118*  BUN 14  CREATININE 0.85  CALCIUM 9.4   CBC: Recent Labs  Lab 12/05/20 1100  WBC 11.5*  HGB 14.1  HCT 45.0  MCV 97.2  PLT 346    Principal Problem:   Transient alteration of awareness Active Problems:   Hypothyroidism   Depression   Restless leg syndrome   Fibromyalgia   OSA (obstructive sleep apnea)   Seizure ([place])   Time coordinating discharge: 35 minutes  Signed:  Murray Hodgkins, MD  [facility]  12/07/2020, 5:45 PM

## 2020-12-07 NOTE — Procedures (Addendum)
Patient Name:Grace Schmitt STM:[id number] Epilepsy Attending:Sherlock Nancarrow [name] Referring Physician/Provider:Dr Bonnell Public Duration:12/11/20210300 to 12/08/2020 0300  Patient history:74 year old female with episodes of transient alteration of awareness with urinary incontinence. EEG to evaluate for seizures.  Level of alertness:Awake, asleep  AEDs during EEG study:None  Technical aspects: This EEG study was done with scalp electrodes positioned according to the 10-20 International system of electrode placement. Electrical activity was acquired at a sampling rate of 500Hz  and reviewed with a high frequency filter of 70Hz  and a low frequency filter of 1Hz . EEG data were recorded continuously and digitally stored.   Description: The posterior dominant rhythm consists of9Hz  activity of moderate voltage (25-35 uV) seen predominantly in posterior head regions, symmetric and reactive to eye opening and eye closing. Sleep was characterized by vertex waves, sleep spindles (12-14hz ), maximal frontocentral region.   IMPRESSION: This studyis within normal limits. No seizures and epileptiform discharges were seen throughout the recording.  [name] [name]

## 2020-12-07 NOTE — Progress Notes (Signed)
Subjective: Still no events. Pt voices frustration & thinks the flurry of spells are now over.   ROS: negative except above  Examination  Vital signs in last 24 hours: Temp:  [97.8 F (36.6 C)-98.2 F (36.8 C)] 98.2 F (36.8 C) (12/11 1215) Pulse Rate:  [69-92] 81 (12/11 1215) Resp:  [14-18] 14 (12/11 1215) BP: (108-137)/(56-86) 111/64 (12/11 1215) SpO2:  [94 %-98 %] 95 % (12/11 1215)  General: lying in bed, not in apparent distress CVS: pulse-normal rate and rhythm RS: breathing comfortably Extremities: normal, warm  Neuro: AOx3, cranial nerves II to XII grossly intact, spontaneously moving all 4 extremities in bed  Basic Metabolic Panel: Recent Labs  Lab 12/05/20 1100  NA 136  K 5.1  CL 99  CO2 28  GLUCOSE 118*  BUN 14  CREATININE 0.85  CALCIUM 9.4    CBC: Recent Labs  Lab 12/05/20 1100  WBC 11.5*  HGB 14.1  HCT 45.0  MCV 97.2  PLT 346     Coagulation Studies: No results for input(s): LABPROT, INR in the last 72 hours.  Imaging No new brain imaging   ASSESSMENT AND PLAN: 11 old female with episodes of transient alteration of awareness.  Transient alteration of awareness -Differentials include epilepsy versus cardiogenic versus dysautonomia  Recommendations: -DC EEG  -Patient currently not on any AEDs -Resume rest of the home medications per primary team -Seizure precautions -As needed IV Ativan 2 mg for seizure lasting more than 2 minutes  Torin Modica Metzger-Cihelka, ARNP-C, ANVP-BC Pager: [phone] [facility] For questions after 5pm please refer to AMION to reach the Neurologist on call

## 2020-12-07 NOTE — Progress Notes (Signed)
Pt continues with EEG monitoring throughout shift. She has denied any pain and/or discomfort as well. 24 hour urine continues to be collected. Pt using pure wick effectively

## 2020-12-07 NOTE — Progress Notes (Addendum)
PROGRESS NOTE  Grace Schmitt [id number]:[id number] DOB: 1946/11/08 DOA: 12/05/2020 PCP: Ma Hillock, DO  Brief History   74 year old woman admitted for long-term video EEG monitoring for further evaluation of multiple episodes of alteration of awareness over the last few days, cyclic in nature.   A & P  Episodes of diaphoresis, head discomfort, tachycardia and hypertension, transient alteration of awarness --Followed by cardiology as an outpatient, currently has a 30-day monitor in progress --Admitted as per neurology for video EEG to evaluate for possible seizures --no spells, EEG thus far unrevealing, continue telemetry.    --Urine for metanephrines and catecholamines --mgmt per neurology  Restless leg syndrome --Reportedly severe, patient on multiple previous medications by report, currently takes methadone at 2 PM and 6 PM which has controlled symptoms --stable, continue methadone  Hypothyroidism --TSH trivial elevation, continue levothyroxine w/o change, f/u as an outpt  Fibromyalgia --continue Cymbalta, gabapentin  Disposition Plan:  Discussion: as above  Status is: Observation   Dispo: The patient is from: Home              Anticipated d/c is to: Home              Anticipated d/c date is: 2 days              Patient currently is not medically stable to d/c.  DVT prophylaxis:    Code Status: Full Code Family Communication: none  Murray Hodgkins, MD  [facility] Direct contact: see [email or web address] (further directions at bottom of note if needed) 7PM-7AM contact night coverage as at bottom of note 12/07/2020, 9:27 AM  LOS: 0 days   Significant Hospital Events   .    Consults:  . Neurology    Procedures:  .   Significant Diagnostic Tests:  Marland Kitchen Video EEG   Micro Data:  .    Antimicrobials:  .   Interval History/Subjective  CC: f/u spells  No new issues, no spells Eating ok  Objective   Vitals:  Vitals:   12/07/20 0331 12/07/20 0825  BP:  115/86 (!) 113/57  Pulse: 71 79  Resp: 18 16  Temp: 97.8 F (36.6 C) 97.9 F (36.6 C)  SpO2: 98% 97%    Exam:  Constitutional:   . Appears calm and comfortable ENMT:  . grossly normal hearing  Respiratory:  . CTA bilaterally, no w/r/r.  . Respiratory effort normal.  Cardiovascular:  . RRR, no m/r/g . No LE extremity edema   . Telemetry SR Psychiatric:  . Mental status o Mood, affect appropriate  I have personally reviewed the following:   Today's Data  . TSH 4.643  Scheduled Meds: . aspirin EC  81 mg Oral Daily  . DULoxetine  60 mg Oral Daily  . gabapentin  300 mg Oral Daily  . levothyroxine  50 mcg Oral Q0600  . methadone  5 mg Oral BID  . sodium chloride flush  3 mL Intravenous Q12H   Continuous Infusions: . sodium chloride      Principal Problem:   Transient alteration of awareness Active Problems:   Hypothyroidism   Depression   Restless leg syndrome   Fibromyalgia   OSA (obstructive sleep apnea)   LOS: 0 days   How to contact the [facility] Attending or Consulting provider [place] or covering provider during after hours [place], for this patient?  1. Check the care team in [facility] and look for a) attending/consulting [facility] provider listed and b) the [facility] team listed  2. Log into [email or web address] and use [place]'s universal password to access. If you do not have the password, please contact the hospital operator. 3. Locate the [facility] provider you are looking for under [facility] and page to a number that you can be directly reached. 4. If you still have difficulty reaching the provider, please page the [facility] (Director on Call) for the Hospitalists listed on amion for assistance.

## 2020-12-07 NOTE — Progress Notes (Signed)
vLTM integrity check   Event button tested   No skin breakdown at  FP1  FP2  F7  F8

## 2020-12-08 NOTE — Procedures (Addendum)
Patient Name:Grace Schmitt GGE:[id number] Epilepsy Attending:Josemanuel Eakins [name] Referring Physician/Provider:DrSrobona Jamse Arn Duration:12/12/20210300 to 12/08/2020 4650  Patient history:74 year old female with episodes of transient alteration of awareness with urinary incontinence. EEG to evaluate for seizures.  Level of alertness:Awake, asleep  AEDs during EEG study:None  Technical aspects: This EEG study was done with scalp electrodes positioned according to the 10-20 International system of electrode placement. Electrical activity was acquired at a sampling rate of 500Hz  and reviewed with a high frequency filter of 70Hz  and a low frequency filter of 1Hz . EEG data were recorded continuously and digitally stored.   Description: The posterior dominant rhythm consists of9Hz  activity of moderate voltage (25-35 uV) seen predominantly in posterior head regions, symmetric and reactive to eye opening and eye closing. Sleep was characterized by vertex waves, sleep spindles (12-14hz ), maximal frontocentral region.  IMPRESSION: This studyis within normal limits.No seizuresand epileptiform dischargeswere seen throughout the recording.  Grace Schmitt [name]

## 2020-12-08 NOTE — Progress Notes (Addendum)
LTM EEG discontinued - no skin breakdown at [facility].  Redness at  [facility]

## 2020-12-08 NOTE — Progress Notes (Signed)
No issues overnight, EEG was not able to be unhooked last night and patient has remained hospitalized.  Remained stable for discharge. No new issues.  No charge note.  Murray Hodgkins, MD [facility]

## 2020-12-08 NOTE — Plan of Care (Signed)
  Problem: Clinical Measurements: Goal: Will remain free from infection Outcome: Progressing   Problem: Clinical Measurements: Goal: Ability to maintain clinical measurements within normal limits will improve Outcome: Progressing Goal: Will remain free from infection Outcome: Progressing   Problem: Health Behavior/Discharge Planning: Goal: Ability to manage health-related needs will improve Outcome: Progressing   Problem: Education: Goal: Knowledge of General Education information will improve Description: Including pain rating scale, medication(s)/side effects and non-pharmacologic comfort measures Outcome: Progressing

## 2020-12-09 ENCOUNTER — Telehealth: Payer: Self-pay

## 2020-12-09 NOTE — Telephone Encounter (Signed)
Attempted TCM call. Patient states she was told at discharge that she needs a hospital followup with Neurology & only needs to follow up with PCP as needed.

## 2020-12-10 LAB — CATECHOLAMINES,UR.,FREE,24 HR: Total Volume: 1500

## 2020-12-12 ENCOUNTER — Telehealth: Payer: Self-pay | Admitting: Neurology

## 2020-12-12 ENCOUNTER — Telehealth: Payer: Self-pay | Admitting: Family Medicine

## 2020-12-12 MED ORDER — ZOSTER VAC RECOMB ADJUVANTED 50 MCG/0.5ML IM SUSR: 0.5000 mL | Freq: Once | INTRAMUSCULAR | 1 refills | Status: AC

## 2020-12-12 NOTE — Telephone Encounter (Signed)
Patient messaged stating she had another light "spell" on Sunday and is willing to try zonisamide. She requested to call in the prescription at [facility]. I called in Zonisamide 50mg  daily. Side effects were discussed during EMU admission.   Please contact me or Dr Delice Lesch for any further concerns.   Grace Schmitt

## 2020-12-12 NOTE — Telephone Encounter (Signed)
Printed shingrix script for pt per request

## 2020-12-24 ENCOUNTER — Telehealth: Payer: Self-pay | Admitting: Neurology

## 2020-12-24 NOTE — Telephone Encounter (Signed)
Patient contacted me to report that she started zonisamide 50 mg at bedtime on December 17 and has been tolerating it well without any side effects.  However, she had two of her "spells" yesterday and another one this morning.  She would like to avoid coming to the ED if possible and is interested in trying increased dose of the medication.  I recommended increasing medication to 100 mg nightly.  Again reiterated the side effects including renal calculi.  I also told patient to come to ED if spells continue.  Patient's outpatient neurologist is Dr. Karel Jarvis.  Recommend patient to contact Dr. Karel Jarvis in future for any further concerns.  Draya Felker Annabelle Harman

## 2020-12-30 DIAGNOSIS — E039 Hypothyroidism, unspecified: Secondary | ICD-10-CM | POA: Diagnosis not present

## 2020-12-30 DIAGNOSIS — D869 Sarcoidosis, unspecified: Secondary | ICD-10-CM | POA: Diagnosis not present

## 2020-12-30 DIAGNOSIS — M8580 Other specified disorders of bone density and structure, unspecified site: Secondary | ICD-10-CM | POA: Diagnosis not present

## 2020-12-30 DIAGNOSIS — R55 Syncope and collapse: Secondary | ICD-10-CM | POA: Diagnosis not present

## 2021-01-02 ENCOUNTER — Telehealth: Payer: Self-pay | Admitting: Neurology

## 2021-01-02 NOTE — Telephone Encounter (Signed)
Patient left a message requesting refills for zonisamide because we had increased her dosage to 100 mg nightly and therefore she ran out early.  I called in a refill at her preferred pharmacy ([facility]).  I reminded her to contact Dr. Karel Jarvis her primary neurologist next time for refills and for further management.   Grace Schmitt

## 2021-01-08 DIAGNOSIS — G4733 Obstructive sleep apnea (adult) (pediatric): Secondary | ICD-10-CM | POA: Diagnosis not present

## 2021-01-16 ENCOUNTER — Other Ambulatory Visit: Payer: Self-pay

## 2021-01-16 DIAGNOSIS — Z9889 Other specified postprocedural states: Secondary | ICD-10-CM | POA: Insufficient documentation

## 2021-01-16 DIAGNOSIS — T7840XA Allergy, unspecified, initial encounter: Secondary | ICD-10-CM | POA: Insufficient documentation

## 2021-01-16 DIAGNOSIS — R112 Nausea with vomiting, unspecified: Secondary | ICD-10-CM | POA: Insufficient documentation

## 2021-01-16 DIAGNOSIS — R079 Chest pain, unspecified: Secondary | ICD-10-CM | POA: Insufficient documentation

## 2021-01-16 DIAGNOSIS — I00 Rheumatic fever without heart involvement: Secondary | ICD-10-CM | POA: Insufficient documentation

## 2021-01-16 DIAGNOSIS — K759 Inflammatory liver disease, unspecified: Secondary | ICD-10-CM | POA: Insufficient documentation

## 2021-01-16 DIAGNOSIS — E079 Disorder of thyroid, unspecified: Secondary | ICD-10-CM | POA: Insufficient documentation

## 2021-01-16 DIAGNOSIS — S129XXA Fracture of neck, unspecified, initial encounter: Secondary | ICD-10-CM | POA: Insufficient documentation

## 2021-01-16 DIAGNOSIS — I209 Angina pectoris, unspecified: Secondary | ICD-10-CM | POA: Insufficient documentation

## 2021-01-16 DIAGNOSIS — R519 Headache, unspecified: Secondary | ICD-10-CM | POA: Insufficient documentation

## 2021-01-17 ENCOUNTER — Other Ambulatory Visit: Payer: Self-pay

## 2021-01-17 ENCOUNTER — Encounter: Payer: Self-pay | Admitting: Pulmonary Disease

## 2021-01-17 ENCOUNTER — Ambulatory Visit: Payer: PPO | Admitting: Pulmonary Disease

## 2021-01-17 VITALS — BP 120/68 | HR 79 | Temp 97.1°F | Ht 63.0 in | Wt 182.6 lb

## 2021-01-17 DIAGNOSIS — Z862 Personal history of diseases of the blood and blood-forming organs and certain disorders involving the immune mechanism: Secondary | ICD-10-CM | POA: Diagnosis not present

## 2021-01-17 DIAGNOSIS — G4733 Obstructive sleep apnea (adult) (pediatric): Secondary | ICD-10-CM

## 2021-01-17 NOTE — Patient Instructions (Signed)
Follow up in 1 year.
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
## 2021-01-20 ENCOUNTER — Ambulatory Visit: Payer: PPO | Admitting: Cardiology

## 2021-01-20 ENCOUNTER — Encounter: Payer: Self-pay | Admitting: Cardiology

## 2021-01-20 ENCOUNTER — Other Ambulatory Visit: Payer: Self-pay

## 2021-01-20 VITALS — BP 130/72 | HR 88 | Ht 63.0 in | Wt 180.1 lb

## 2021-01-20 DIAGNOSIS — Z789 Other specified health status: Secondary | ICD-10-CM

## 2021-01-20 DIAGNOSIS — D869 Sarcoidosis, unspecified: Secondary | ICD-10-CM | POA: Diagnosis not present

## 2021-01-20 DIAGNOSIS — R002 Palpitations: Secondary | ICD-10-CM | POA: Diagnosis not present

## 2021-01-20 DIAGNOSIS — E782 Mixed hyperlipidemia: Secondary | ICD-10-CM | POA: Diagnosis not present

## 2021-01-20 DIAGNOSIS — I6789 Other cerebrovascular disease: Secondary | ICD-10-CM
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
## 2021-01-20 NOTE — Patient Instructions (Signed)
Medication Instructions:  No medication changes. *If you need a refill on your cardiac medications before your next appointment, please call your pharmacy*   Lab Work: Your physician recommends that you return for lab work in: next few days. You need to have labs done when you are fasting.  You can come Monday through Friday 8:30 am to 12:00 pm and 1:15 to 4:30. You do not need to make an appointment as the order has already been placed. The labs you are going to have done are BMET, TSH, LFT and Lipids.  If you have labs (blood work) drawn today and your tests are completely normal, you will receive your results only by: Marland Kitchen MyChart Message (if you have MyChart) OR . A paper copy in the mail If you have any lab test that is abnormal or we need to change your treatment, we will call you to review the results.   Testing/Procedures: None ordered   Follow-Up: At [facility], you and your health needs are our priority.  As part of our continuing mission to provide you with exceptional heart care, we have created designated Provider Care Teams.  These Care Teams include your primary Cardiologist (physician) and Advanced Practice Providers (APPs -  Physician Assistants and Nurse Practitioners) who all work together to provide you with the care you need, when you need it.  We recommend signing up for the patient portal called "MyChart".  Sign up information is provided on this After Visit Summary.  MyChart is used to connect with patients for Virtual Visits (Telemedicine).  Patients are able to view lab/test results, encounter notes, upcoming appointments, etc.  Non-urgent messages can be sent to your provider as well.   To learn more about what you can do with MyChart, go to [email or web address].    Your next appointment:   4 month(s)  The format for your next appointment:   In Person  Provider:   Jyl Heinz, MD   Other Instructions NA

## 2021-01-21 DIAGNOSIS — I6789 Other cerebrovascular disease: Secondary | ICD-10-CM | POA: Diagnosis not present

## 2021-01-21 DIAGNOSIS — R002 Palpitations: Secondary | ICD-10-CM | POA: Diagnosis not present

## 2021-01-21 DIAGNOSIS — E782 Mixed hyperlipidemia: Secondary | ICD-10-CM | POA: Diagnosis not present

## 2021-01-22 LAB — BASIC METABOLIC PANEL
BUN/Creatinine Ratio: 21 (ref 12–28)
BUN: 14 mg/dL (ref 8–27)
CO2: 21 mmol/L (ref 20–29)
Calcium: 9.5 mg/dL (ref 8.7–10.3)
Chloride: 101 mmol/L (ref 96–106)
Creatinine, Ser: 0.67 mg/dL (ref 0.57–1.00)
GFR calc Af Amer: 99 mL/min/{1.73_m2} (ref >59)
GFR calc non Af Amer: 86 mL/min/{1.73_m2} (ref >59)
Glucose: 98 mg/dL (ref 65–99)
Potassium: 4.4 mmol/L (ref 3.5–5.2)
Sodium: 139 mmol/L (ref 134–144)

## 2021-01-22 LAB — LIPID PANEL
Chol/HDL Ratio: 2 ratio (ref 0.0–4.4)
Cholesterol, Total: 185 mg/dL (ref 100–199)
HDL: 93 mg/dL (ref >39)
LDL Chol Calc (NIH): 76 mg/dL (ref 0–99)
Triglycerides: 93 mg/dL (ref 0–149)
VLDL Cholesterol Cal: 16 mg/dL (ref 5–40)

## 2021-01-22 LAB — HEPATIC FUNCTION PANEL
ALT: 14 IU/L (ref 0–32)
AST: 16 IU/L (ref 0–40)
Albumin: 4.6 g/dL (ref 3.7–4.7)
Alkaline Phosphatase: 92 IU/L (ref 44–121)
Bilirubin Total: 0.4 mg/dL (ref 0.0–1.2)
Bilirubin, Direct: 0.14 mg/dL (ref 0.00–0.40)
Total Protein: 7.3 g/dL (ref 6.0–8.5)

## 2021-01-22 LAB — TSH: TSH: 2.54 u[IU]/mL (ref 0.450–4.500)

## 2021-02-10 ENCOUNTER — Other Ambulatory Visit: Payer: Self-pay

## 2021-02-10 ENCOUNTER — Encounter (HOSPITAL_COMMUNITY): Payer: Self-pay | Admitting: Emergency Medicine

## 2021-02-10 ENCOUNTER — Emergency Department (HOSPITAL_COMMUNITY)
Admission: EM | Admit: 2021-02-10 | Discharge: 2021-02-10 | Disposition: A | Discharge status: Home / Self Care | Payer: PPO | Attending: Emergency Medicine | Admitting: Emergency Medicine

## 2021-02-10 DIAGNOSIS — R569 Unspecified convulsions: Secondary | ICD-10-CM | POA: Insufficient documentation

## 2021-02-10 DIAGNOSIS — Z5321 Procedure and treatment not carried out due to patient leaving prior to being seen by health care provider: Secondary | ICD-10-CM | POA: Diagnosis not present

## 2021-02-10 LAB — BASIC METABOLIC PANEL
Anion gap: 10 (ref 5–15)
BUN: 13 mg/dL (ref 8–23)
CO2: 25 mmol/L (ref 22–32)
Calcium: 9.2 mg/dL (ref 8.9–10.3)
Chloride: 103 mmol/L (ref 98–111)
Creatinine, Ser: 0.71 mg/dL (ref 0.44–1.00)
GFR, Estimated: >60 mL/min (ref >60)
Glucose, Bld: 105 mg/dL — ABNORMAL HIGH (ref 70–99)
Potassium: 4.4 mmol/L (ref 3.5–5.1)
Sodium: 138 mmol/L (ref 135–145)

## 2021-02-10 LAB — CBC
HCT: 43.5 % (ref 36.0–46.0)
Hemoglobin: 13.8 g/dL (ref 12.0–15.0)
MCH: 30.6 pg (ref 26.0–34.0)
MCHC: 31.7 g/dL (ref 30.0–36.0)
MCV: 96.5 fL (ref 80.0–100.0)
Platelets: 327 10*3/uL (ref 150–400)
RBC: 4.51 MIL/uL (ref 3.87–5.11)
RDW: 13 % (ref 11.5–15.5)
WBC: 9.4 10*3/uL (ref 4.0–10.5)
nRBC: 0 % (ref 0.0–0.2)

## 2021-02-10 NOTE — ED Triage Notes (Signed)
Patient coming from home. States she has been having absent seizures for approx 2-3 years. Patient states she started new seizure medication in December and is still having seizures. States last seizures occurred today. Patient A&Ox4 with NAD in triage.

## 2021-02-10 NOTE — ED Notes (Signed)
[facility] 

## 2021-02-11 ENCOUNTER — Ambulatory Visit: Payer: PPO | Admitting: Neurology

## 2021-02-11 ENCOUNTER — Encounter: Payer: Self-pay | Admitting: Neurology

## 2021-02-11 VITALS — BP 147/78 | HR 82 | Ht 63.0 in | Wt 185.6 lb

## 2021-02-11 DIAGNOSIS — R404 Transient alteration of awareness: Secondary | ICD-10-CM

## 2021-02-11 MED ORDER — LORAZEPAM 0.5 MG PO TABS: ORAL_TABLET | ORAL | 5 refills | Status: DC

## 2021-02-11 NOTE — Patient Instructions (Signed)
1. Stop the Zonisamide  2. Try taking Ativan 0.5mg  as needed to hopefully stop the clusters  3. Update me in a week and hopefully as things settle down with the restless leg we can increase the gabapentin  4. Follow-up in 3 months, call for any changes.
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
## 2021-02-19 ENCOUNTER — Ambulatory Visit: Payer: PPO | Admitting: Neurology

## 2021-03-26 DIAGNOSIS — M25571 Pain in right ankle and joints of right foot: Secondary | ICD-10-CM | POA: Diagnosis not present

## 2021-04-03 ENCOUNTER — Encounter: Payer: PPO | Admitting: Sports Medicine

## 2021-04-09 DIAGNOSIS — L820 Inflamed seborrheic keratosis: Secondary | ICD-10-CM | POA: Diagnosis not present

## 2021-04-09 DIAGNOSIS — L821 Other seborrheic keratosis: Secondary | ICD-10-CM | POA: Diagnosis not present

## 2021-04-09 DIAGNOSIS — M76821 Posterior tibial tendinitis, right leg: Secondary | ICD-10-CM | POA: Diagnosis not present

## 2021-04-17 DIAGNOSIS — H9012 Conductive hearing loss, unilateral, left ear, with unrestricted hearing on the contralateral side: Secondary | ICD-10-CM | POA: Diagnosis not present

## 2021-04-17 DIAGNOSIS — H9312 Tinnitus, left ear: Secondary | ICD-10-CM | POA: Diagnosis not present

## 2021-04-17 DIAGNOSIS — Z6834 Body mass index (BMI) 34.0-34.9, adult: Secondary | ICD-10-CM

## 2021-04-17 DIAGNOSIS — G4733 Obstructive sleep apnea (adult) (pediatric): Secondary | ICD-10-CM | POA: Diagnosis not present

## 2021-04-17 HISTORY — DX: Body mass index (BMI) 34.0-34.9, adult: Z68.34

## 2021-04-21 ENCOUNTER — Other Ambulatory Visit: Payer: Self-pay | Admitting: Neurology

## 2021-04-21 ENCOUNTER — Other Ambulatory Visit: Payer: Self-pay

## 2021-04-21 MED ORDER — LAMOTRIGINE 25 MG PO TABS: ORAL_TABLET | ORAL | 0 refills | Status: DC

## 2021-05-22 ENCOUNTER — Ambulatory Visit: Payer: PPO | Admitting: Neurology

## 2021-05-22 ENCOUNTER — Encounter: Payer: Self-pay | Admitting: Neurology

## 2021-05-22 ENCOUNTER — Other Ambulatory Visit: Payer: Self-pay

## 2021-05-22 VITALS — BP 131/81 | HR 76 | Ht 63.0 in | Wt 189.2 lb

## 2021-05-22 DIAGNOSIS — R404 Transient alteration of awareness: Secondary | ICD-10-CM | POA: Diagnosis not present

## 2021-05-22 DIAGNOSIS — R569 Unspecified convulsions: Secondary | ICD-10-CM | POA: Diagnosis not present

## 2021-05-22 MED ORDER — VALTOCO 10 MG DOSE 10 MG/0.1ML NA LIQD: NASAL | 5 refills | Status: DC

## 2021-05-22 MED ORDER — LAMOTRIGINE 25 MG PO TABS: ORAL_TABLET | ORAL | 6 refills | Status: DC
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
## 2021-05-22 NOTE — Patient Instructions (Signed)
1. Continue Lamictal 25mg : take 2 tablets in AM, 2 tablets in PM and update me in another 2 weeks.  2. Try Valtoco nasal spray as needed for clusters. May take another dose after 4 hours if needed.  3. Follow-up in 3 months,call for any changes

## 2021-05-27 ENCOUNTER — Other Ambulatory Visit: Payer: Self-pay

## 2021-05-27 ENCOUNTER — Encounter: Payer: Self-pay | Admitting: Cardiology

## 2021-05-27 ENCOUNTER — Ambulatory Visit: Payer: PPO | Admitting: Cardiology

## 2021-05-27 VITALS — BP 128/80 | HR 82 | Ht 63.0 in | Wt 187.0 lb

## 2021-05-27 DIAGNOSIS — I6789 Other cerebrovascular disease: Secondary | ICD-10-CM

## 2021-05-27 DIAGNOSIS — Z6834 Body mass index (BMI) 34.0-34.9, adult: Secondary | ICD-10-CM

## 2021-05-27 DIAGNOSIS — E782 Mixed hyperlipidemia: Secondary | ICD-10-CM | POA: Diagnosis not present

## 2021-05-27 DIAGNOSIS — E039 Hypothyroidism, unspecified: Secondary | ICD-10-CM | POA: Diagnosis not present

## 2021-05-27 DIAGNOSIS — R002 Palpitations: Secondary | ICD-10-CM
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
## 2021-05-27 NOTE — Patient Instructions (Signed)

## 2021-05-28 DIAGNOSIS — E782 Mixed hyperlipidemia: Secondary | ICD-10-CM | POA: Diagnosis not present

## 2021-05-28 DIAGNOSIS — I6789 Other cerebrovascular disease: Secondary | ICD-10-CM | POA: Diagnosis not present

## 2021-05-28 DIAGNOSIS — E039 Hypothyroidism, unspecified: Secondary | ICD-10-CM | POA: Diagnosis not present

## 2021-05-29 ENCOUNTER — Ambulatory Visit
Admission: RE | Admit: 2021-05-29 | Discharge: 2021-05-29 | Disposition: A | Discharge status: Home / Self Care | Payer: PPO | Source: Ambulatory Visit | Attending: Family Medicine | Admitting: Family Medicine

## 2021-05-29 ENCOUNTER — Other Ambulatory Visit: Payer: Self-pay

## 2021-05-29 DIAGNOSIS — M8589 Other specified disorders of bone density and structure, multiple sites: Secondary | ICD-10-CM | POA: Diagnosis not present

## 2021-05-29 DIAGNOSIS — Z780 Asymptomatic menopausal state: Secondary | ICD-10-CM | POA: Diagnosis not present

## 2021-05-29 DIAGNOSIS — Z1231 Encounter for screening mammogram for malignant neoplasm of breast: Secondary | ICD-10-CM | POA: Diagnosis not present

## 2021-05-29 LAB — LIPID PANEL
Chol/HDL Ratio: 2.4 ratio (ref 0.0–4.4)
Cholesterol, Total: 164 mg/dL (ref 100–199)
HDL: 68 mg/dL (ref >39)
LDL Chol Calc (NIH): 75 mg/dL (ref 0–99)
Triglycerides: 123 mg/dL (ref 0–149)
VLDL Cholesterol Cal: 21 mg/dL (ref 5–40)

## 2021-05-29 LAB — CBC WITH DIFFERENTIAL/PLATELET
Basophils Absolute: 0.1 10*3/uL (ref 0.0–0.2)
Basos: 1 %
EOS (ABSOLUTE): 0.6 10*3/uL — ABNORMAL HIGH (ref 0.0–0.4)
Eos: 7 %
Hematocrit: 41.1 % (ref 34.0–46.6)
Hemoglobin: 13.6 g/dL (ref 11.1–15.9)
Immature Grans (Abs): 0 10*3/uL (ref 0.0–0.1)
Immature Granulocytes: 1 %
Lymphocytes Absolute: 2.2 10*3/uL (ref 0.7–3.1)
Lymphs: 24 %
MCH: 30.8 pg (ref 26.6–33.0)
MCHC: 33.1 g/dL (ref 31.5–35.7)
MCV: 93 fL (ref 79–97)
Monocytes Absolute: 0.8 10*3/uL (ref 0.1–0.9)
Monocytes: 9 %
Neutrophils Absolute: 5.2 10*3/uL (ref 1.4–7.0)
Neutrophils: 58 %
Platelets: 324 10*3/uL (ref 150–450)
RBC: 4.42 x10E6/uL (ref 3.77–5.28)
RDW: 12.5 % (ref 11.7–15.4)
WBC: 8.8 10*3/uL (ref 3.4–10.8)

## 2021-05-29 LAB — HEPATIC FUNCTION PANEL
ALT: 14 IU/L (ref 0–32)
AST: 20 IU/L (ref 0–40)
Albumin: 4.5 g/dL (ref 3.7–4.7)
Alkaline Phosphatase: 83 IU/L (ref 44–121)
Bilirubin Total: 0.3 mg/dL (ref 0.0–1.2)
Bilirubin, Direct: <0.1 mg/dL (ref 0.00–0.40)
Total Protein: 6.9 g/dL (ref 6.0–8.5)

## 2021-05-29 LAB — BASIC METABOLIC PANEL
BUN/Creatinine Ratio: 19 (ref 12–28)
BUN: 15 mg/dL (ref 8–27)
CO2: 24 mmol/L (ref 20–29)
Calcium: 9.1 mg/dL (ref 8.7–10.3)
Chloride: 97 mmol/L (ref 96–106)
Creatinine, Ser: 0.79 mg/dL (ref 0.57–1.00)
Glucose: 89 mg/dL (ref 65–99)
Potassium: 4.6 mmol/L (ref 3.5–5.2)
Sodium: 135 mmol/L (ref 134–144)
eGFR: 77 mL/min/{1.73_m2} (ref >59)

## 2021-05-29 LAB — TSH: TSH: 2.62 u[IU]/mL (ref 0.450–4.500)

## 2021-05-30 ENCOUNTER — Telehealth: Payer: Self-pay | Admitting: Family Medicine

## 2021-05-30 NOTE — Telephone Encounter (Signed)
Please inform patient the following information: Her bone density scan is unchanged from her prior scan in 2017.  Still mild decrease in bone density, overall stable.  We will repeat her bone density scan in 2-3 years.

## 2021-06-02 NOTE — Telephone Encounter (Signed)
Spoke with pt regarding labs and instructions.   

## 2021-06-22 ENCOUNTER — Other Ambulatory Visit: Payer: Self-pay | Admitting: Family Medicine

## 2021-06-22 DIAGNOSIS — I6789 Other cerebrovascular disease: Secondary | ICD-10-CM

## 2021-06-22 DIAGNOSIS — Z789 Other specified health status: Secondary | ICD-10-CM

## 2021-06-22 DIAGNOSIS — E7800 Pure hypercholesterolemia, unspecified: Secondary | ICD-10-CM

## 2021-06-26 ENCOUNTER — Other Ambulatory Visit: Payer: Self-pay

## 2021-07-09 ENCOUNTER — Other Ambulatory Visit: Payer: Self-pay | Admitting: Family Medicine

## 2021-07-09 DIAGNOSIS — E7800 Pure hypercholesterolemia, unspecified: Secondary | ICD-10-CM

## 2021-07-09 DIAGNOSIS — I6789 Other cerebrovascular disease: Secondary | ICD-10-CM

## 2021-07-09 DIAGNOSIS — Z789 Other specified health status: Secondary | ICD-10-CM

## 2021-07-16 ENCOUNTER — Telehealth: Payer: Self-pay | Admitting: Family Medicine

## 2021-07-16 MED ORDER — LEVOTHYROXINE SODIUM 50 MCG PO TABS: 50.0000 ug | ORAL_TABLET | Freq: Every day | ORAL | 0 refills | Status: DC

## 2021-07-16 NOTE — Telephone Encounter (Signed)
Rx sent. MUST HAVE OV FOR FURTHER REFILLS

## 2021-07-16 NOTE — Telephone Encounter (Signed)
Patient requesting refill of Synthroid. Please send to same [facility] in [place].

## 2021-07-28 ENCOUNTER — Other Ambulatory Visit: Payer: Self-pay | Admitting: Family Medicine

## 2021-07-28 DIAGNOSIS — Z789 Other specified health status: Secondary | ICD-10-CM

## 2021-07-28 DIAGNOSIS — I6789 Other cerebrovascular disease: Secondary | ICD-10-CM

## 2021-07-28 DIAGNOSIS — E7800 Pure hypercholesterolemia, unspecified: Secondary | ICD-10-CM

## 2021-07-29 ENCOUNTER — Telehealth: Payer: Self-pay | Admitting: Family Medicine

## 2021-07-29 DIAGNOSIS — E7800 Pure hypercholesterolemia, unspecified: Secondary | ICD-10-CM

## 2021-07-29 DIAGNOSIS — I6789 Other cerebrovascular disease: Secondary | ICD-10-CM

## 2021-07-29 DIAGNOSIS — Z789 Other specified health status: Secondary | ICD-10-CM

## 2021-07-29 MED ORDER — REPATHA 140 MG/ML ~~LOC~~ SOSY: 140.0000 mg | PREFILLED_SYRINGE | SUBCUTANEOUS | 0 refills | Status: DC

## 2021-07-29 NOTE — Telephone Encounter (Signed)
Patient requesting refill of Repatha. States she was not told last refill that she would need OV for further refills of this and a few other meds. She has scheduled OV appt for 08/01/21.

## 2021-07-29 NOTE — Telephone Encounter (Signed)
Rx sent. Pt aware.  

## 2021-08-01 ENCOUNTER — Ambulatory Visit (INDEPENDENT_AMBULATORY_CARE_PROVIDER_SITE_OTHER): Payer: PPO | Admitting: Family Medicine

## 2021-08-01 ENCOUNTER — Other Ambulatory Visit: Payer: Self-pay

## 2021-08-01 ENCOUNTER — Encounter: Payer: Self-pay | Admitting: Family Medicine

## 2021-08-01 VITALS — BP 116/71 | HR 67 | Temp 98.7°F | Ht 63.0 in | Wt 189.0 lb

## 2021-08-01 DIAGNOSIS — Z789 Other specified health status: Secondary | ICD-10-CM

## 2021-08-01 DIAGNOSIS — E039 Hypothyroidism, unspecified: Secondary | ICD-10-CM

## 2021-08-01 DIAGNOSIS — E669 Obesity, unspecified: Secondary | ICD-10-CM | POA: Insufficient documentation

## 2021-08-01 DIAGNOSIS — E7800 Pure hypercholesterolemia, unspecified: Secondary | ICD-10-CM

## 2021-08-01 DIAGNOSIS — I6789 Other cerebrovascular disease: Secondary | ICD-10-CM

## 2021-08-01 HISTORY — DX: Obesity, unspecified: E66.9

## 2021-08-01 MED ORDER — LEVOTHYROXINE SODIUM 50 MCG PO TABS: 50.0000 ug | ORAL_TABLET | Freq: Every day | ORAL | 3 refills | Status: DC

## 2021-08-01 MED ORDER — REPATHA 140 MG/ML ~~LOC~~ SOSY: 140.0000 mg | PREFILLED_SYRINGE | SUBCUTANEOUS | 11 refills | Status: DC

## 2021-08-01 NOTE — Patient Instructions (Signed)
Great to see you today.  I have refilled the medication(s) we provide.   I have refilled meds for a year
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
## 2021-08-25 ENCOUNTER — Ambulatory Visit: Payer: PPO | Admitting: Neurology

## 2021-08-25 ENCOUNTER — Encounter: Payer: Self-pay | Admitting: Neurology

## 2021-08-25 ENCOUNTER — Other Ambulatory Visit: Payer: Self-pay

## 2021-08-25 VITALS — BP 130/77 | HR 77 | Ht 63.0 in | Wt 191.8 lb

## 2021-08-25 DIAGNOSIS — R404 Transient alteration of awareness: Secondary | ICD-10-CM

## 2021-08-25 MED ORDER — LAMOTRIGINE 25 MG PO TABS: ORAL_TABLET | ORAL | 6 refills | Status: DC
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
## 2021-10-01 LAB — HM DIABETES FOOT EXAM: HM Diabetic Foot Exam: NORMAL

## 2021-10-19 ENCOUNTER — Other Ambulatory Visit: Payer: Self-pay | Admitting: Neurology

## 2021-11-17 DIAGNOSIS — G4733 Obstructive sleep apnea (adult) (pediatric): Secondary | ICD-10-CM | POA: Diagnosis not present

## 2021-12-10 ENCOUNTER — Telehealth: Payer: Self-pay | Admitting: Cardiology

## 2021-12-10 ENCOUNTER — Ambulatory Visit (INDEPENDENT_AMBULATORY_CARE_PROVIDER_SITE_OTHER): Payer: PPO

## 2021-12-10 ENCOUNTER — Other Ambulatory Visit: Payer: Self-pay

## 2021-12-10 ENCOUNTER — Ambulatory Visit: Payer: PPO

## 2021-12-10 DIAGNOSIS — Z91120 Patient's intentional underdosing of medication regimen due to financial hardship: Secondary | ICD-10-CM | POA: Diagnosis not present

## 2021-12-10 DIAGNOSIS — Z0000 Encounter for general adult medical examination without abnormal findings: Secondary | ICD-10-CM | POA: Diagnosis not present

## 2021-12-10 NOTE — Telephone Encounter (Signed)
Patient spouse called in stating that Dr. Geraldo Pitter was going to prescribed her a new medication for cholesterol.  He doesn't recall the name of the medication.   He will needed call in to [facility] in [place].
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
## 2021-12-10 NOTE — Patient Instructions (Signed)
Grace Schmitt , Thank you for taking time to come for your Medicare Wellness Visit. I appreciate your ongoing commitment to your health goals. Please review the following plan we discussed and let me know if I can assist you in the future.   Screening recommendations/referrals: Colonoscopy: Done 04/29/16 repeat every 5 years Due 2023 Mammogram: Done 05/29/21 repeat every year  Bone Density: Done 05/29/21 repeat every 2 years  Recommended yearly ophthalmology/optometry visit for glaucoma screening and checkup Recommended yearly dental visit for hygiene and checkup  Vaccinations: Influenza vaccine: Done 09/22/21 repeat every year  Pneumococcal vaccine: Up to date Tdap vaccine: Due and discussed  Shingles vaccine: Shingrix discussed. Please contact your pharmacy for coverage information.    Covid-19:Completed 2/22, 3/15, 09/30/20, 06/13/21, 11/26/21  Advanced directives: Please bring a copy of your health care power of attorney and living will to the office at your convenience.  Conditions/risks identified: Get back to exercise without causing pain  Next appointment: Follow up in one year for your annual wellness visit    Preventive Care 65 Years and Older, Female Preventive care refers to lifestyle choices and visits with your health care provider that can promote health and wellness. What does preventive care include? A yearly physical exam. This is also called an annual well check. Dental exams once or twice a year. Routine eye exams. Ask your health care provider how often you should have your eyes checked. Personal lifestyle choices, including: Daily care of your teeth and gums. Regular physical activity. Eating a healthy diet. Avoiding tobacco and drug use. Limiting alcohol use. Practicing safe sex. Taking low-dose aspirin every day. Taking vitamin and mineral supplements as recommended by your health care provider. What happens during an annual well check? The services and screenings  done by your health care provider during your annual well check will depend on your age, overall health, lifestyle risk factors, and family history of disease. Counseling  Your health care provider may ask you questions about your: Alcohol use. Tobacco use. Drug use. Emotional well-being. Home and relationship well-being. Sexual activity. Eating habits. History of falls. Memory and ability to understand (cognition). Work and work Statistician. Reproductive health. Screening  You may have the following tests or measurements: Height, weight, and BMI. Blood pressure. Lipid and cholesterol levels. These may be checked every 5 years, or more frequently if you are over 27 years old. Skin check. Lung cancer screening. You may have this screening every year starting at age 31 if you have a 30-pack-year history of smoking and currently smoke or have quit within the past 15 years. Fecal occult blood test (FOBT) of the stool. You may have this test every year starting at age 72. Flexible sigmoidoscopy or colonoscopy. You may have a sigmoidoscopy every 5 years or a colonoscopy every 10 years starting at age 20. Hepatitis C blood test. Hepatitis B blood test. Sexually transmitted disease (STD) testing. Diabetes screening. This is done by checking your blood sugar (glucose) after you have not eaten for a while (fasting). You may have this done every 1-3 years. Bone density scan. This is done to screen for osteoporosis. You may have this done starting at age 45. Mammogram. This may be done every 1-2 years. Talk to your health care provider about how often you should have regular mammograms. Talk with your health care provider about your test results, treatment options, and if necessary, the need for more tests. Vaccines  Your health care provider may recommend certain vaccines, such as: Influenza  vaccine. This is recommended every year. Tetanus, diphtheria, and acellular pertussis (Tdap, Td)  vaccine. You may need a Td booster every 10 years. Zoster vaccine. You may need this after age 58. Pneumococcal 13-valent conjugate (PCV13) vaccine. One dose is recommended after age 91. Pneumococcal polysaccharide (PPSV23) vaccine. One dose is recommended after age 17. Talk to your health care provider about which screenings and vaccines you need and how often you need them. This information is not intended to replace advice given to you by your health care provider. Make sure you discuss any questions you have with your health care provider. Document Released: 01/10/2016 Document Revised: 09/02/2016 Document Reviewed: 10/15/2015 Elsevier Interactive Patient Education  2017 [place] Prevention in the Home Falls can cause injuries. They can happen to people of all ages. There are many things you can do to make your home safe and to help prevent falls. What can I do on the outside of my home? Regularly fix the edges of walkways and driveways and fix any cracks. Remove anything that might make you trip as you walk through a door, such as a raised step or threshold. Trim any bushes or trees on the path to your home. Use bright outdoor lighting. Clear any walking paths of anything that might make someone trip, such as rocks or tools. Regularly check to see if handrails are loose or broken. Make sure that both sides of any steps have handrails. Any raised decks and porches should have guardrails on the edges. Have any leaves, snow, or ice cleared regularly. Use sand or salt on walking paths during winter. Clean up any spills in your garage right away. This includes oil or grease spills. What can I do in the bathroom? Use night lights. Install grab bars by the toilet and in the tub and shower. Do not use towel bars as grab bars. Use non-skid mats or decals in the tub or shower. If you need to sit down in the shower, use a plastic, non-slip stool. Keep the floor dry. Clean up any  water that spills on the floor as soon as it happens. Remove soap buildup in the tub or shower regularly. Attach bath mats securely with double-sided non-slip rug tape. Do not have throw rugs and other things on the floor that can make you trip. What can I do in the bedroom? Use night lights. Make sure that you have a light by your bed that is easy to reach. Do not use any sheets or blankets that are too big for your bed. They should not hang down onto the floor. Have a firm chair that has side arms. You can use this for support while you get dressed. Do not have throw rugs and other things on the floor that can make you trip. What can I do in the kitchen? Clean up any spills right away. Avoid walking on wet floors. Keep items that you use a lot in easy-to-reach places. If you need to reach something above you, use a strong step stool that has a grab bar. Keep electrical cords out of the way. Do not use floor polish or wax that makes floors slippery. If you must use wax, use non-skid floor wax. Do not have throw rugs and other things on the floor that can make you trip. What can I do with my stairs? Do not leave any items on the stairs. Make sure that there are handrails on both sides of the stairs and use them. Fix  handrails that are broken or loose. Make sure that handrails are as long as the stairways. Check any carpeting to make sure that it is firmly attached to the stairs. Fix any carpet that is loose or worn. Avoid having throw rugs at the top or bottom of the stairs. If you do have throw rugs, attach them to the floor with carpet tape. Make sure that you have a light switch at the top of the stairs and the bottom of the stairs. If you do not have them, ask someone to add them for you. What else can I do to help prevent falls? Wear shoes that: Do not have high heels. Have rubber bottoms. Are comfortable and fit you well. Are closed at the toe. Do not wear sandals. If you use a  stepladder: Make sure that it is fully opened. Do not climb a closed stepladder. Make sure that both sides of the stepladder are locked into place. Ask someone to hold it for you, if possible. Clearly mark and make sure that you can see: Any grab bars or handrails. First and last steps. Where the edge of each step is. Use tools that help you move around (mobility aids) if they are needed. These include: Canes. Walkers. Scooters. Crutches. Turn on the lights when you go into a dark area. Replace any light bulbs as soon as they burn out. Set up your furniture so you have a clear path. Avoid moving your furniture around. If any of your floors are uneven, fix them. If there are any pets around you, be aware of where they are. Review your medicines with your doctor. Some medicines can make you feel dizzy. This can increase your chance of falling. Ask your doctor what other things that you can do to help prevent falls. This information is not intended to replace advice given to you by your health care provider. Make sure you discuss any questions you have with your health care provider. Document Released: 10/10/2009 Document Revised: 05/21/2016 Document Reviewed: 01/18/2015 Elsevier Interactive Patient Education  2017 [employer].

## 2021-12-15 ENCOUNTER — Ambulatory Visit: Payer: PPO | Admitting: Neurology

## 2021-12-15 ENCOUNTER — Other Ambulatory Visit: Payer: Self-pay

## 2021-12-15 ENCOUNTER — Encounter: Payer: Self-pay | Admitting: Neurology

## 2021-12-15 VITALS — BP 142/85 | HR 86 | Resp 20 | Ht 63.0 in | Wt 184.0 lb

## 2021-12-15 DIAGNOSIS — R569 Unspecified convulsions: Secondary | ICD-10-CM

## 2021-12-15 DIAGNOSIS — R404 Transient alteration of awareness: Secondary | ICD-10-CM | POA: Diagnosis not present

## 2021-12-15 MED ORDER — LAMOTRIGINE 25 MG PO TABS: ORAL_TABLET | ORAL | 0 refills | Status: DC
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
## 2021-12-18 DIAGNOSIS — R102 Pelvic and perineal pain: Secondary | ICD-10-CM | POA: Diagnosis not present

## 2021-12-25 ENCOUNTER — Encounter: Payer: Self-pay | Admitting: Neurology

## 2022-01-06 NOTE — Telephone Encounter (Signed)
Spoke with patient's husband Joneen Caraway per DPR, and asked him about the cholesterol medication she has listed in her my chart which is Repatha that is prescribed by her PCP.  He informed me she is still taking this and to discard the message. He thanked me for the call.

## 2022-01-26 DIAGNOSIS — E611 Iron deficiency: Secondary | ICD-10-CM | POA: Diagnosis not present

## 2022-01-26 DIAGNOSIS — R569 Unspecified convulsions: Secondary | ICD-10-CM | POA: Diagnosis not present

## 2022-01-29 ENCOUNTER — Encounter: Payer: Self-pay | Admitting: Neurology

## 2022-01-29 MED ORDER — LAMOTRIGINE 100 MG PO TABS: ORAL_TABLET | ORAL | 3 refills | Status: DC

## 2022-02-27 ENCOUNTER — Ambulatory Visit: Payer: PPO | Admitting: Cardiology

## 2022-03-02 DIAGNOSIS — Z124 Encounter for screening for malignant neoplasm of cervix: Secondary | ICD-10-CM | POA: Diagnosis not present

## 2022-03-02 DIAGNOSIS — N952 Postmenopausal atrophic vaginitis: Secondary | ICD-10-CM | POA: Diagnosis not present

## 2022-03-02 DIAGNOSIS — Z6834 Body mass index (BMI) 34.0-34.9, adult: Secondary | ICD-10-CM | POA: Diagnosis not present

## 2022-03-18 ENCOUNTER — Encounter: Payer: Self-pay | Admitting: Neurology

## 2022-03-18 ENCOUNTER — Telehealth (INDEPENDENT_AMBULATORY_CARE_PROVIDER_SITE_OTHER): Payer: PPO | Admitting: Neurology

## 2022-03-18 DIAGNOSIS — R404 Transient alteration of awareness: Secondary | ICD-10-CM | POA: Diagnosis not present

## 2022-03-18 DIAGNOSIS — R55 Syncope and collapse: Secondary | ICD-10-CM
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
## 2022-03-18 NOTE — Patient Instructions (Signed)
Good to see you! Continue Lamotrigine '150mg'$  twice a day. Continue symptom diary. Follow-up in 4 months, call for any changes.  ? ? ?Seizure Precautions: ?1. If medication has been prescribed for you to prevent seizures, take it exactly as directed.  Do not stop taking the medicine without talking to your doctor first, even if you have not had a seizure in a long time.  ? ?2. Avoid activities in which a seizure would cause danger to yourself or to others.  Don't operate dangerous machinery, swim alone, or climb in high or dangerous places, such as on ladders, roofs, or girders.  Do not drive unless your doctor says you may. ? ?3. If you have any warning that you may have a seizure, lay down in a safe place where you can't hurt yourself.   ? ?4.  No driving for 6 months from last seizure, as per [facility].   Please refer to the following link on the [place] website for more information: http://www.epilepsyfoundation.org/answerplace/Social/driving/drivingu.cfm  ? ?5.  Maintain good sleep hygiene. Avoid alcohol. ? ?6.  Contact your doctor if you have any problems that may be related to the medicine you are taking. ? ?7.  Call 911 and bring the patient back to the ED if: ?      ? A.  The seizure lasts longer than 5 minutes.      ? B.  The patient doesn't awaken shortly after the seizure ? C.  The patient has new problems such as difficulty seeing, speaking or moving ? D.  The patient was injured during the seizure ? E.  The patient has a temperature over 102 F (39C) ? F.  The patient vomited and now is having trouble breathing ?      ? ?

## 2022-04-07 ENCOUNTER — Other Ambulatory Visit: Payer: Self-pay

## 2022-04-08 ENCOUNTER — Encounter: Payer: Self-pay | Admitting: Family Medicine

## 2022-04-09 ENCOUNTER — Ambulatory Visit: Payer: PPO | Admitting: Cardiology

## 2022-04-17 DIAGNOSIS — G4733 Obstructive sleep apnea (adult) (pediatric): Secondary | ICD-10-CM | POA: Diagnosis not present

## 2022-05-01 ENCOUNTER — Encounter: Payer: Self-pay | Admitting: Neurology

## 2022-05-01 ENCOUNTER — Other Ambulatory Visit: Payer: Self-pay | Admitting: Neurology

## 2022-05-01 MED ORDER — LAMOTRIGINE 100 MG PO TABS: ORAL_TABLET | ORAL | 3 refills | Status: DC

## 2022-05-06 ENCOUNTER — Other Ambulatory Visit: Payer: Self-pay

## 2022-05-06 ENCOUNTER — Telehealth: Payer: Self-pay | Admitting: Neurology

## 2022-05-06 MED ORDER — LAMOTRIGINE 100 MG PO TABS: ORAL_TABLET | ORAL | 3 refills | Status: DC

## 2022-05-06 NOTE — Telephone Encounter (Signed)
Pls send my apologies, the computer system automatically had [facility] on file as pharmacy so it was sent there, I sent to [place] today. Thanks ?

## 2022-05-06 NOTE — Telephone Encounter (Signed)
Patient called and said [facility] in [place] does not have her new, increased perscription for Lamotrigine 200 MG twice a day. ? ?She only has two days left of the medicine. ?

## 2022-05-07 NOTE — Telephone Encounter (Signed)
Called patient and informed her that prescription was sent to [facility]. Patient verbalized understanding and had no further questions or concerns.  ?

## 2022-05-12 ENCOUNTER — Ambulatory Visit: Payer: PPO | Admitting: Cardiology

## 2022-05-12 ENCOUNTER — Encounter: Payer: Self-pay | Admitting: Cardiology

## 2022-05-12 VITALS — BP 144/72 | HR 66 | Ht 63.0 in | Wt 189.0 lb

## 2022-05-12 DIAGNOSIS — G4733 Obstructive sleep apnea (adult) (pediatric): Secondary | ICD-10-CM

## 2022-05-12 DIAGNOSIS — Z789 Other specified health status: Secondary | ICD-10-CM

## 2022-05-12 DIAGNOSIS — E782 Mixed hyperlipidemia: Secondary | ICD-10-CM | POA: Diagnosis not present

## 2022-05-12 DIAGNOSIS — I6789 Other cerebrovascular disease: Secondary | ICD-10-CM

## 2022-05-12 DIAGNOSIS — E669 Obesity, unspecified: Secondary | ICD-10-CM | POA: Insufficient documentation
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
## 2022-05-12 NOTE — Patient Instructions (Signed)
Medication Instructions:  ?Your physician recommends that you continue on your current medications as directed. Please refer to the Current Medication list given to you today.  ?*If you need a refill on your cardiac medications before your next appointment, please call your pharmacy* ? ? ?Lab Work: ?Your physician recommends that you return for lab work in: 3 months ?You need to have labs done when you are fasting You do not need to make an appointment as the order has already been placed. The labs you are going to have done are BMET, LFT and Lipids. ? ? ?[address] in [place]. They also close daily for lunch for 12-1. ?  ?[address] 2nd floor M-W 8-11:30 and 1-4:30 and Thursday and Friday 8-11:30. ? ?If you have labs (blood work) drawn today and your tests are completely normal, you will receive your results only by: ?MyChart Message (if you have MyChart) OR ?A paper copy in the mail ?If you have any lab test that is abnormal or we need to change your treatment, we will call you to review the results. ? ? ?Testing/Procedures: ?None ordered ? ? ?Follow-Up: ?At [facility], you and your health needs are our priority.  As part of our continuing mission to provide you with exceptional heart care, we have created designated Provider Care Teams.  These Care Teams include your primary Cardiologist (physician) and Advanced Practice Providers (APPs -  Physician Assistants and Nurse Practitioners) who all work together to provide you with the care you need, when you need it. ? ?We recommend signing up for the patient portal called "MyChart".  Sign up information is provided on this After Visit Summary.  MyChart is used to connect with patients for Virtual Visits (Telemedicine).  Patients are able to view lab/test results, encounter notes, upcoming appointments, etc.  Non-urgent messages can be sent to your provider as well.   ?To learn more about what you can do with MyChart,  go to [email or web address].   ? ?Your next appointment:   ?9 month(s) ? ?The format for your next appointment:   ?In Person ? ?Provider:   ?Jyl Heinz, MD ? ? ?Other Instructions ?NA ? ?

## 2022-05-12 NOTE — Progress Notes (Signed)
Sleep Apnea Evaluation ? ?[place] ? ?Today's Date: 05/12/2022  ? ?Patient Name: Grace Schmitt        ?DOB: Aug 04, 1946  ?     ?Height:  '5\' 3"'$  (1.6 m)     ?Weight: 189 lb 0.6 oz (85.7 kg)  ?BMI: Body mass index is 33.49 kg/m?.  ?  ?Referring Provider:  Revankar ?  ?STOP-BANG RISK ASSESSMENT    ?   ? ? ?If STOP-BANG Score ?3 OR two clinical symptoms - patient qualifies for WatchPAT (CPT 95800)     ? ?Sleep study ordered due to two (2) of the following clinical symptoms/diagnoses:  ?Excessive daytime sleepiness G47.10  Gastroesophageal reflux K21.9  Nocturia R35.1  Morning Headaches G44.221  Difficulty concentrating R41.840  Memory problems or poor judgment G31.84  Personality changes or irritability R45.4  Loud snoring R06.83  Depression F32.9  Unrefreshed by sleep G47.8  Impotence N52.9  History of high blood pressure R03.0  Insomnia G47.00  Sleep Disordered Breathing or Sleep Apnea ICD G47.33   ? ? ?

## 2022-05-18 ENCOUNTER — Telehealth: Payer: Self-pay | Admitting: Cardiology

## 2022-05-18 NOTE — Telephone Encounter (Signed)
Pt called in stated that she was give the at home sleep test, (watch 1), She got home and put it on and it states it is not registered.  She called the 888 number and they told her it was not registered.  She would like a call back to know what she needs to do from here   Best number [phone]

## 2022-05-18 NOTE — Telephone Encounter (Signed)
Spoke with the patient who states that she went to do her sleep study last night but she couldn't because she had not been registered. Patient gave me the serial number for her study and I have added her to [employer]. She will try again tonight and let us know tomorrow if it does not work.

## 2022-05-19 ENCOUNTER — Telehealth: Payer: Self-pay | Admitting: Cardiology

## 2022-05-19 NOTE — Telephone Encounter (Signed)
Patient said she tried to do her home sleep study last night. She said she put her finger in and then when she took it out the adhesive cam off. Now she can not put her finger back in the device. She is not sure what to do next. Please advise

## 2022-05-19 NOTE — Telephone Encounter (Signed)
Advised to call the support line. Pt verbalized understanding and had no additional questions.

## 2022-06-01 ENCOUNTER — Encounter: Payer: Self-pay | Admitting: Family Medicine

## 2022-06-01 ENCOUNTER — Ambulatory Visit (INDEPENDENT_AMBULATORY_CARE_PROVIDER_SITE_OTHER): Payer: PPO | Admitting: Family Medicine

## 2022-06-01 VITALS — BP 105/69 | HR 70 | Temp 98.2°F | Ht 63.0 in | Wt 186.0 lb

## 2022-06-01 DIAGNOSIS — M2550 Pain in unspecified joint: Secondary | ICD-10-CM | POA: Insufficient documentation

## 2022-06-01 DIAGNOSIS — R413 Other amnesia: Secondary | ICD-10-CM | POA: Diagnosis not present

## 2022-06-01 DIAGNOSIS — M62838 Other muscle spasm: Secondary | ICD-10-CM | POA: Diagnosis not present

## 2022-06-01 HISTORY — DX: Other amnesia: R41.3

## 2022-06-01 HISTORY — DX: Pain in unspecified joint: M25.50

## 2022-06-01 LAB — MAGNESIUM: Magnesium: 2.4 mg/dL (ref 1.5–2.5)

## 2022-06-01 LAB — COMPREHENSIVE METABOLIC PANEL
ALT: 23 U/L (ref 0–35)
AST: 19 U/L (ref 0–37)
Albumin: 4.6 g/dL (ref 3.5–5.2)
Alkaline Phosphatase: 66 U/L (ref 39–117)
BUN: 21 mg/dL (ref 6–23)
CO2: 28 mEq/L (ref 19–32)
Calcium: 9.9 mg/dL (ref 8.4–10.5)
Chloride: 101 mEq/L (ref 96–112)
Creatinine, Ser: 0.83 mg/dL (ref 0.40–1.20)
GFR: 68.17 mL/min (ref >60.00)
Glucose, Bld: 97 mg/dL (ref 70–99)
Potassium: 4 mEq/L (ref 3.5–5.1)
Sodium: 139 mEq/L (ref 135–145)
Total Bilirubin: 0.5 mg/dL (ref 0.2–1.2)
Total Protein: 7.7 g/dL (ref 6.0–8.3)

## 2022-06-01 LAB — VITAMIN B12: Vitamin B-12: 1040 pg/mL — ABNORMAL HIGH (ref 211–911)

## 2022-06-01 LAB — VITAMIN D 25 HYDROXY (VIT D DEFICIENCY, FRACTURES): VITD: 69.21 ng/mL (ref 30.00–100.00)

## 2022-06-01 LAB — SEDIMENTATION RATE: Sed Rate: 42 mm/hr — ABNORMAL HIGH (ref 0–30)

## 2022-06-01 LAB — TSH: TSH: 1.58 u[IU]/mL (ref 0.35–5.50)

## 2022-06-01 NOTE — Patient Instructions (Signed)
We will call you with lab results and make a plan on next steps. Arthritis Arthritis means joint pain. It can also mean joint disease. A joint is a place where bones come together. There are more than 100 types of arthritis. What are the causes? Wear and tear of a joint. This is the most common cause. Too much of a chemical called uric acid in the blood, which leads to pain in the joint (gout). Pain and swelling (inflammation) in a joint. Infection of a joint. Injuries in the joint. A reaction to medicines (allergy). In some cases, the cause may not be known. What are the signs or symptoms? Pain in a joint when moving. Redness at a joint. Swelling at a joint. Stiffness at a joint. Warmth coming from the joint. A fever. A feeling of being sick. How is this treated? This condition may be treated with: Treating the cause, if it is known. Rest. Raising (elevating) the joint. Putting cold or hot packs on the joint. Medicines to treat symptoms and reduce pain and swelling. Shots (injections) of medicines, such as cortisone, into the joint. You may also be told to make changes in your life, such as doing exercises and losing weight. Follow these instructions at home: Medicines Take over-the-counter and prescription medicines only as told by your doctor. Do not take aspirin for pain if your doctor says that you may have gout. Activity Rest your joint if your doctor tells you to. Avoid activities that make the pain worse. Exercise your joint regularly as told by your doctor. Try doing exercises like: Swimming. Water aerobics. Biking. Walking. Managing pain, stiffness, and swelling     If told, put ice on the affected area. To do this: Put ice in a plastic bag. Place a towel between your skin and the bag. Leave the ice on for 20 minutes, 2-3 times a day. Take off the ice if your skin turns bright red. This is very important. If you cannot feel pain, heat, or cold, you have a  greater risk of damage to the area. If your joint is swollen, raise (elevate) it above the level of your heart if told by your doctor. If your joint feels stiff in the morning, try taking a warm shower. If told, put heat on the affected area. Do this as often as told by your doctor. Use the heat source that your doctor recommends, such as a moist heat pack or a heating pad. If you have diabetes, do not apply heat without asking your doctor. To apply heat: Place a towel between your skin and the heat source. Leave the heat on for 20-30 minutes. Take off the heat if your skin turns bright red. This is very important. If you cannot feel pain, heat, or cold, you have a greater risk of getting burned. General instructions Maintain a healthy weight. Follow instructions from your doctor for weight control. Do not smoke or use any products that contain nicotine or tobacco. If you need help quitting, ask your doctor. Keep all follow-up visits. Where to find more information [employer] [facility]: www.niams.SouthEx[employer].es Contact a doctor if: The pain gets worse. You have a fever. Get help right away if: You have very bad pain in your joint. You have swelling in your joint. Your joint is red. Many joints become painful and swollen. You have very bad back pain. Your leg is very weak. Summary Arthritis means joint pain. It can also mean joint disease. A joint is a place  where bones come together. The most common cause of this condition is wear and tear of a joint. Symptoms of this condition include redness, swelling, or stiffness of the joint. This condition is treated with rest, raising the joint, medicines, and putting cold or hot packs on the joint. Follow your doctor's instructions about medicines, activity, exercises, and other home care treatments. This information is not intended to replace advice given to you by your health care provider. Make sure you discuss any questions you have with  your health care provider. Document Revised: 09/23/2021 Document Reviewed: 09/23/2021 Elsevier Patient Education  [place].
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
## 2022-06-03 ENCOUNTER — Telehealth: Payer: Self-pay | Admitting: Family Medicine

## 2022-06-03 ENCOUNTER — Encounter: Payer: Self-pay | Admitting: Family Medicine

## 2022-06-03 DIAGNOSIS — R7689 Other specified abnormal immunological findings in serum: Secondary | ICD-10-CM

## 2022-06-03 DIAGNOSIS — R700 Elevated erythrocyte sedimentation rate: Secondary | ICD-10-CM

## 2022-06-03 DIAGNOSIS — M2550 Pain in unspecified joint: Secondary | ICD-10-CM

## 2022-06-03 DIAGNOSIS — R768 Other specified abnormal immunological findings in serum: Secondary | ICD-10-CM

## 2022-06-03 LAB — ANA, IFA COMPREHENSIVE PANEL
Anti Nuclear Antibody (ANA): NEGATIVE
ENA SM Ab Ser-aCnc: <1 AI
SM/RNP: <1 AI
SSA (Ro) (ENA) Antibody, IgG: <1 AI
SSB (La) (ENA) Antibody, IgG: <1 AI
Scleroderma (Scl-70) (ENA) Antibody, IgG: 1.2 AI — AB
ds DNA Ab: 1 IU/mL

## 2022-06-03 LAB — RHEUMATOID FACTOR: Rheumatoid fact SerPl-aCnc: <14 IU/mL (ref <14)

## 2022-06-03 LAB — ANGIOTENSIN CONVERTING ENZYME: Angiotensin-Converting Enzyme: 24 U/L (ref 9–67)

## 2022-06-03 LAB — PTH, INTACT AND CALCIUM
Calcium: 9.7 mg/dL (ref 8.6–10.4)
PTH: 8 pg/mL — ABNORMAL LOW (ref 16–77)

## 2022-06-03 LAB — CYCLIC CITRUL PEPTIDE ANTIBODY, IGG: Cyclic Citrullin Peptide Ab: <16 UNITS

## 2022-06-03 MED ORDER — DICLOFENAC SODIUM 75 MG PO TBEC: 75.0000 mg | DELAYED_RELEASE_TABLET | Freq: Two times a day (BID) | ORAL | 5 refills | Status: DC

## 2022-06-03 NOTE — Telephone Encounter (Signed)
Please call patient: Her liver, kidney and thyroid functions are normal. Calcium levels are normal.  Her ACE levels are also normal, which rules out sarcoid flare as a concern. B12 levels look great.  Her inflammatory marker is just mildly elevated, could be related to the arthritis. Her autoimmune panel is negative, with the exception of a mild positive antibody for scleroderma.  Scleroderma can cause joint pain and could cause the inflammatory marker to be mildly elevated like hers.  Neck step would be to get her to a rheumatologist to further evaluate to see if this is a true positive test for her.  I have placed the referral to rheumatology for her.  In the meantime, call in Voltaren which is taken twice a day with food to help with joint discomfort.    Vitamin D levels are higher than desired.  Vitamin D can be toxic at higher levels and can cause memory issues.  Her level is not at toxic level, but it is higher than desired at 69.2.  -Hydrate well and decrease her vitamin D supplement by half

## 2022-06-04 ENCOUNTER — Telehealth: Payer: Self-pay | Admitting: Cardiology

## 2022-06-04 ENCOUNTER — Telehealth: Payer: Self-pay

## 2022-06-04 NOTE — Telephone Encounter (Signed)
Patient is calling about her watch pat one device.  She states the nurse had given her a number to call, she called the number and they couldn't find her, they were supposed to give her a call back and they never called her back.

## 2022-06-04 NOTE — Telephone Encounter (Signed)
Let pt know to come pick new Itamar Device.

## 2022-06-04 NOTE — Telephone Encounter (Addendum)
Patient called the company for her watch pat, they told her to call our office back because she would need a new machine order for her since she had already put it on once. They gave her a ticket number if there was any problems ordering her a new machine.  The ticket number is [phone].

## 2022-06-04 NOTE — Telephone Encounter (Signed)
Spoke with pt regarding labs and instructions.   

## 2022-06-04 NOTE — Telephone Encounter (Signed)
Called pt- She stated that she never got a call back from the watch pat support line. Looked her up and she is listed as started and in progress. Encouraged her to call them again and if she has any issues to have them call the office. Pt agreed and had no further questions.

## 2022-06-04 NOTE — Telephone Encounter (Signed)
Patient called the company for her watch pat, they told her to call our office back because she would need a new machine order for her since she had already put it on once. They gave her a ticket number if there was any problems ordering her a new machine.  The ticket number is [phone].

## 2022-06-08 ENCOUNTER — Telehealth: Payer: Self-pay

## 2022-06-08 ENCOUNTER — Encounter: Payer: Self-pay | Admitting: Cardiology

## 2022-06-08 NOTE — Telephone Encounter (Signed)
Spoke with pt who was upset about having to drive to [facility] office 2 times to pick up Itamar and no one was there. Pt did not tell anyone she was going to the [facility] and the staff did not verify. She agreed to have [facility] staff call her on Tuesday morning to let them know when they arrive so she can come get itamar. Margreta Journey agreed to take Itamar and call pt.

## 2022-06-09 ENCOUNTER — Encounter (HOSPITAL_BASED_OUTPATIENT_CLINIC_OR_DEPARTMENT_OTHER): Payer: PPO | Admitting: Cardiology

## 2022-06-09 DIAGNOSIS — E669 Obesity, unspecified: Secondary | ICD-10-CM

## 2022-06-09 DIAGNOSIS — R0683 Snoring: Secondary | ICD-10-CM | POA: Diagnosis not present

## 2022-06-09 DIAGNOSIS — G4733 Obstructive sleep apnea (adult) (pediatric): Secondary | ICD-10-CM

## 2022-06-10 NOTE — Telephone Encounter (Signed)
Called company to inquire what to do with Jaynie Crumble that didn't work. Representative told me we could throw the broken device away and to let our rep know. I emailed rep and let them. Serial number for watchpat we disposed of is [id number]

## 2022-06-22 DIAGNOSIS — E782 Mixed hyperlipidemia: Secondary | ICD-10-CM | POA: Diagnosis not present

## 2022-06-22 DIAGNOSIS — I6789 Other cerebrovascular disease: Secondary | ICD-10-CM | POA: Diagnosis not present

## 2022-06-22 DIAGNOSIS — G4733 Obstructive sleep apnea (adult) (pediatric): Secondary | ICD-10-CM | POA: Diagnosis not present

## 2022-06-23 LAB — HEPATIC FUNCTION PANEL
ALT: 24 IU/L (ref 0–32)
AST: 20 IU/L (ref 0–40)
Albumin: 4.5 g/dL (ref 3.7–4.7)
Alkaline Phosphatase: 66 IU/L (ref 44–121)
Bilirubin Total: 0.3 mg/dL (ref 0.0–1.2)
Bilirubin, Direct: 0.11 mg/dL (ref 0.00–0.40)
Total Protein: 7.2 g/dL (ref 6.0–8.5)

## 2022-06-23 LAB — BASIC METABOLIC PANEL
BUN/Creatinine Ratio: 21 (ref 12–28)
BUN: 18 mg/dL (ref 8–27)
CO2: 22 mmol/L (ref 20–29)
Calcium: 9.5 mg/dL (ref 8.7–10.3)
Chloride: 101 mmol/L (ref 96–106)
Creatinine, Ser: 0.87 mg/dL (ref 0.57–1.00)
Glucose: 98 mg/dL (ref 70–99)
Potassium: 4.6 mmol/L (ref 3.5–5.2)
Sodium: 137 mmol/L (ref 134–144)
eGFR: 69 mL/min/{1.73_m2} (ref >59)

## 2022-06-23 LAB — LIPID PANEL
Chol/HDL Ratio: 2.9 ratio (ref 0.0–4.4)
Cholesterol, Total: 183 mg/dL (ref 100–199)
HDL: 63 mg/dL (ref >39)
LDL Chol Calc (NIH): 103 mg/dL — ABNORMAL HIGH (ref 0–99)
Triglycerides: 93 mg/dL (ref 0–149)
VLDL Cholesterol Cal: 17 mg/dL (ref 5–40)

## 2022-06-26 ENCOUNTER — Encounter: Payer: Self-pay | Admitting: Neurology

## 2022-07-22 DIAGNOSIS — L821 Other seborrheic keratosis: Secondary | ICD-10-CM | POA: Diagnosis not present

## 2022-07-22 DIAGNOSIS — L218 Other seborrheic dermatitis: Secondary | ICD-10-CM | POA: Diagnosis not present

## 2022-07-22 DIAGNOSIS — L723 Sebaceous cyst: Secondary | ICD-10-CM | POA: Diagnosis not present

## 2022-08-03 ENCOUNTER — Encounter: Payer: Self-pay | Admitting: Family Medicine

## 2022-08-03 ENCOUNTER — Ambulatory Visit (INDEPENDENT_AMBULATORY_CARE_PROVIDER_SITE_OTHER): Payer: PPO | Admitting: Family Medicine

## 2022-08-03 VITALS — BP 131/71 | HR 72 | Temp 98.2°F | Ht 63.0 in | Wt 177.0 lb

## 2022-08-03 DIAGNOSIS — E7800 Pure hypercholesterolemia, unspecified: Secondary | ICD-10-CM

## 2022-08-03 DIAGNOSIS — Z789 Other specified health status: Secondary | ICD-10-CM | POA: Diagnosis not present

## 2022-08-03 DIAGNOSIS — R7309 Other abnormal glucose: Secondary | ICD-10-CM

## 2022-08-03 DIAGNOSIS — I6789 Other cerebrovascular disease: Secondary | ICD-10-CM

## 2022-08-03 LAB — CBC WITH DIFFERENTIAL/PLATELET
Basophils Absolute: 0.1 10*3/uL (ref 0.0–0.1)
Basophils Relative: 1.2 % (ref 0.0–3.0)
Eosinophils Absolute: 0.7 10*3/uL (ref 0.0–0.7)
Eosinophils Relative: 8.3 % — ABNORMAL HIGH (ref 0.0–5.0)
HCT: 39.4 % (ref 36.0–46.0)
Hemoglobin: 13.4 g/dL (ref 12.0–15.0)
Lymphocytes Relative: 18.9 % (ref 12.0–46.0)
Lymphs Abs: 1.6 10*3/uL (ref 0.7–4.0)
MCHC: 34 g/dL (ref 30.0–36.0)
MCV: 95.4 fl (ref 78.0–100.0)
Monocytes Absolute: 0.5 10*3/uL (ref 0.1–1.0)
Monocytes Relative: 5.8 % (ref 3.0–12.0)
Neutro Abs: 5.6 10*3/uL (ref 1.4–7.7)
Neutrophils Relative %: 65.8 % (ref 43.0–77.0)
Platelets: 284 10*3/uL (ref 150.0–400.0)
RBC: 4.13 Mil/uL (ref 3.87–5.11)
RDW: 13.7 % (ref 11.5–15.5)
WBC: 8.5 10*3/uL (ref 4.0–10.5)

## 2022-08-03 LAB — HEMOGLOBIN A1C: Hgb A1c MFr Bld: 6 % (ref 4.6–6.5)

## 2022-08-03 MED ORDER — DICLOFENAC SODIUM 75 MG PO TBEC: 75.0000 mg | DELAYED_RELEASE_TABLET | Freq: Two times a day (BID) | ORAL | 5 refills | Status: DC

## 2022-08-03 MED ORDER — REPATHA 140 MG/ML ~~LOC~~ SOSY: 140.0000 mg | PREFILLED_SYRINGE | SUBCUTANEOUS | 11 refills | Status: DC

## 2022-08-03 MED ORDER — LEVOTHYROXINE SODIUM 50 MCG PO TABS: 50.0000 ug | ORAL_TABLET | Freq: Every day | ORAL | 3 refills | Status: DC

## 2022-08-03 NOTE — Patient Instructions (Addendum)
Return in about 1 year (around 08/05/2023) for Routine chronic condition follow-up.        Great to see you today.  I have refilled the medication(s) we provide.   If labs were collected, we will inform you of lab results once received either by echart message or telephone call.   - echart message- for normal results that have been seen by the patient already.   - telephone call: abnormal results or if patient has not viewed results in their echart.
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
## 2022-08-12 ENCOUNTER — Other Ambulatory Visit: Payer: Self-pay

## 2022-08-27 ENCOUNTER — Telehealth: Payer: Self-pay | Admitting: *Deleted

## 2022-08-27 NOTE — Telephone Encounter (Signed)
Called pt to inquire about the [facility] Sleep Study she picked up in [place] on 06/09/22. Pt says she sent the study in June and received a confirmation on her phone and was wondering why she hadn't heard back. Let her know that we had not received anything on our end. Dr. Radford Pax checked to see if she had overlooked it but stated it never showed up in her Queue. Pt stated she would look at it again when she gets back in town and see if she can figure out what went wrong. She will call us back on Tuesday Sept. 5th to let us know.

## 2022-09-15 ENCOUNTER — Encounter: Payer: Self-pay | Admitting: Adult Health

## 2022-09-15 ENCOUNTER — Ambulatory Visit (INDEPENDENT_AMBULATORY_CARE_PROVIDER_SITE_OTHER): Payer: PPO

## 2022-09-15 ENCOUNTER — Telehealth: Payer: Self-pay | Admitting: Cardiology

## 2022-09-15 ENCOUNTER — Ambulatory Visit: Payer: PPO | Admitting: Adult Health

## 2022-09-15 VITALS — BP 110/60 | HR 67 | Ht 63.0 in | Wt 177.8 lb

## 2022-09-15 DIAGNOSIS — R059 Cough, unspecified: Secondary | ICD-10-CM | POA: Diagnosis not present

## 2022-09-15 DIAGNOSIS — G4733 Obstructive sleep apnea (adult) (pediatric): Secondary | ICD-10-CM | POA: Diagnosis not present

## 2022-09-15 DIAGNOSIS — R058 Other specified cough: Secondary | ICD-10-CM | POA: Diagnosis not present

## 2022-09-15 DIAGNOSIS — Z862 Personal history of diseases of the blood and blood-forming organs and certain disorders involving the immune mechanism: Secondary | ICD-10-CM

## 2022-09-15 DIAGNOSIS — R062 Wheezing: Secondary | ICD-10-CM | POA: Diagnosis not present

## 2022-09-15 DIAGNOSIS — D869 Sarcoidosis, unspecified: Secondary | ICD-10-CM

## 2022-09-15 HISTORY — DX: Other specified cough: R05.8
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
## 2022-09-15 NOTE — Patient Instructions (Addendum)
Chest xray today  Saline nasal spray Twice daily   Saline nasal gel At bedtime   Begin Flonase 2 puff daily for 1 week then as needed  Change Claritin to Zyrtec 10 mg daily  Delsym 2 tsp Twice daily  As needed cough.  Prilosec '20mg'$  daily for 2 weeks then As needed   Continue on CPAP At bedtime   Follow up in 6 weeks with Dr. Halford Chessman  and As needed   Please contact office for sooner follow up if symptoms do not improve or worsen or seek emergency care

## 2022-09-15 NOTE — Assessment & Plan Note (Signed)
Continue on CPAP at bedtime.  Plan  Patient Instructions  Chest xray today  Saline nasal spray Twice daily   Saline nasal gel At bedtime   Begin Flonase 2 puff daily for 1 week then as needed  Change Claritin to Zyrtec 10 mg daily  Delsym 2 tsp Twice daily  As needed cough.  Prilosec '20mg'$  daily for 2 weeks then As needed   Continue on CPAP At bedtime   Follow up in 6 weeks with Dr. Halford Chessman  and As needed   Please contact office for sooner follow up if symptoms do not improve or worsen or seek emergency care

## 2022-09-15 NOTE — Progress Notes (Signed)
Reviewed and agree with assessment/plan.   Chesley Mires, MD [facility] Pulmonary/Critical Care 09/15/2022, 7:53 PM Pager:  [phone]

## 2022-09-15 NOTE — Telephone Encounter (Signed)
Patient called stating she contacted the company about the sleep study she had done in June.  She said the company told her the results to the sleep study was sent to the doctor who does the sleep studies.  The patient was told if we haven't received it we when need to call them so we can get it resent to [place].

## 2022-09-15 NOTE — Assessment & Plan Note (Addendum)
History of sarcoidosis with treatment in 2018 with steroids.  Has not really been active since then.  Now with cough x4 weeks.  We will check chest x-ray today.  Hold on steroids at this time.  Plan  Patient Instructions  Chest xray today  Saline nasal spray Twice daily   Saline nasal gel At bedtime   Begin Flonase 2 puff daily for 1 week then as needed  Change Claritin to Zyrtec 10 mg daily  Delsym 2 tsp Twice daily  As needed cough.  Prilosec '20mg'$  daily for 2 weeks then As needed   Continue on CPAP At bedtime   Follow up in 6 weeks with Dr. Halford Chessman  and As needed   Please contact office for sooner follow up if symptoms do not improve or worsen or seek emergency care

## 2022-09-15 NOTE — Assessment & Plan Note (Addendum)
Cough and wheezing x4 weeks may have a component of some reactive airways versus upper airway cough syndrome.  We will treat cough triggers such as chronic rhinitis and GERD.  May use cough suppressant with Delsym.  Check chest x-ray today. Hold on steroids and antibiotics at this time.  Plan  Patient Instructions  Chest xray today  Saline nasal spray Twice daily   Saline nasal gel At bedtime   Begin Flonase 2 puff daily for 1 week then as needed  Change Claritin to Zyrtec 10 mg daily  Delsym 2 tsp Twice daily  As needed cough.  Prilosec '20mg'$  daily for 2 weeks then As needed   Continue on CPAP At bedtime   Follow up in 6 weeks with Dr. Halford Chessman  and As needed   Please contact office for sooner follow up if symptoms do not improve or worsen or seek emergency care     '

## 2022-09-16 ENCOUNTER — Other Ambulatory Visit: Payer: Self-pay | Admitting: *Deleted

## 2022-09-16 DIAGNOSIS — D869 Sarcoidosis, unspecified: Secondary | ICD-10-CM

## 2022-09-16 MED ORDER — PREDNISONE 10 MG PO TABS: ORAL_TABLET | ORAL | 0 refills | Status: DC

## 2022-09-16 NOTE — Progress Notes (Signed)
Called and spoke with patient, advised her of the results/recommendations per Rexene Edison NP.  She verbalized understanding.  Scheduled for f/u in Oct with Tammy and order placed for HR CT scan.  Nothing further needed.
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
## 2022-09-17 ENCOUNTER — Encounter: Payer: Self-pay | Admitting: Internal Medicine

## 2022-09-17 ENCOUNTER — Ambulatory Visit (INDEPENDENT_AMBULATORY_CARE_PROVIDER_SITE_OTHER): Payer: PPO

## 2022-09-17 ENCOUNTER — Ambulatory Visit: Payer: PPO | Attending: Internal Medicine | Admitting: Internal Medicine

## 2022-09-17 VITALS — BP 118/78 | HR 69 | Resp 15 | Ht 62.0 in | Wt 176.8 lb

## 2022-09-17 DIAGNOSIS — E559 Vitamin D deficiency, unspecified: Secondary | ICD-10-CM

## 2022-09-17 DIAGNOSIS — M2550 Pain in unspecified joint: Secondary | ICD-10-CM

## 2022-09-17 DIAGNOSIS — M79671 Pain in right foot: Secondary | ICD-10-CM | POA: Diagnosis not present

## 2022-09-17 DIAGNOSIS — M79672 Pain in left foot: Secondary | ICD-10-CM

## 2022-09-17 DIAGNOSIS — M79641 Pain in right hand: Secondary | ICD-10-CM

## 2022-09-17 DIAGNOSIS — M79642 Pain in left hand: Secondary | ICD-10-CM | POA: Diagnosis not present

## 2022-09-17 DIAGNOSIS — M797 Fibromyalgia: Secondary | ICD-10-CM

## 2022-09-17 DIAGNOSIS — D869 Sarcoidosis, unspecified: Secondary | ICD-10-CM

## 2022-09-17 HISTORY — DX: Vitamin D deficiency, unspecified: E55.9

## 2022-09-23 ENCOUNTER — Telehealth: Payer: Self-pay | Admitting: *Deleted

## 2022-09-23 LAB — BASIC METABOLIC PANEL WITH GFR
BUN: 23 mg/dL (ref 7–25)
CO2: 29 mmol/L (ref 20–32)
Calcium: 9.6 mg/dL (ref 8.6–10.4)
Chloride: 100 mmol/L (ref 98–110)
Creat: 0.82 mg/dL (ref 0.60–1.00)
Glucose, Bld: 91 mg/dL (ref 65–99)
Potassium: 4.9 mmol/L (ref 3.5–5.3)
Sodium: 137 mmol/L (ref 135–146)
eGFR: 74 mL/min/{1.73_m2} (ref >=60)

## 2022-09-23 LAB — VITAMIN D 1,25 DIHYDROXY
Vitamin D 1, 25 (OH)2 Total: 37 pg/mL (ref 18–72)
Vitamin D2 1, 25 (OH)2: <8 pg/mL
Vitamin D3 1, 25 (OH)2: 37 pg/mL

## 2022-09-23 LAB — SEDIMENTATION RATE: Sed Rate: 19 mm/h (ref 0–30)

## 2022-09-23 NOTE — Telephone Encounter (Signed)
Sent email to [facility] on their website to inquire about this sleep study. Requested they send results to Dr. Golden Hurter again as per pt's contact with the company (see phone note).

## 2022-09-24 ENCOUNTER — Telehealth: Payer: Self-pay | Admitting: *Deleted

## 2022-09-24 NOTE — Telephone Encounter (Signed)
Patient notified of study results and states that she currently uses a CPAP that is managed by Pulmonary. She has appointment to be seen by them next week and will ask if she needs to continue her CPAP.

## 2022-09-24 NOTE — Telephone Encounter (Signed)
-----   Message from Sueanne Margarita, MD sent at 09/24/2022 10:15 AM EDT ----- Please let patient know that sleep study showed no significant sleep apnea.

## 2022-09-24 NOTE — Procedures (Signed)
     Patient Information Study Date: 06/09/22 Patient Name: Grace Schmitt Patient ID: [id number] Birth Date: 30-Sep-2046 Age: 76 Gender:  BMI: 32.8 (W=185 lb, H=5' 3'') Referring Physician: Jyl Heinz, MD  TEST DESCRIPTION: Home sleep apnea testing was completed using the WatchPat, a Type 1 device, utilizing peripheral arterial tonometry (PAT), chest movement, actigraphy, pulse oximetry, pulse rate, body position and snore. AHI was calculated with apnea and hypopnea using valid sleep time as the denominator. RDI includes apneas, hypopneas, and RERAs. The data acquired and the scoring of sleep and all associated events were performed in accordance with the recommended standards and specifications as outlined in the AASM Manual for the Scoring of Sleep and Associated Events 2.2.0 (2015).   FINDINGS: 1.  No evidence of Obstructive Sleep Apnea with AHI 2.8/hr.  2.  No Central Sleep Apnea. 3.  Oxygen desaturations as low as 89%. 4.  Mild snoring was present. O2 sats were < 88% for 0 minutes. 5.  Total sleep time was 7 hrs and 31 min. 6.  17.2% of total sleep time was spent in REM sleep.  7.  Normal sleep onset latency at 16 min.  8.  Prolonged REM sleep onset latency at 290 min.  9.  Total awakenings were 2.   DIAGNOSIS:  Normal study with no significant sleep disordered breathing.  RECOMMENDATIONS:   1. Normal study with no significant sleep disordered breathing.  2.  Healthy sleep recommendations include:  adequate nightly sleep (normal 7-9 hrs/night), avoidance of caffeine after noon and alcohol near bedtime, and maintaining a sleep environment that is cool, dark and quiet.  3.  Weight loss for overweight patients is recommended.    4.  Snoring recommendations include:  weight loss where appropriate, side sleeping, and avoidance of alcohol before bed.  5.  Operation of motor vehicle or dangerous equipment must be avoided when feeling drowsy, excessively sleepy, or mentally  fatigued.    6.  An ENT consultation which may be useful for specific causes of and possible treatment of bothersome snoring.   7. Weight loss may be of benefit in reducing the severity of snoring.   Signature: Fransico Him, MD; [facility]; [place], [place] Board of Sleep Medicine Electronically Signed: 09/24/22

## 2022-09-25 ENCOUNTER — Ambulatory Visit (HOSPITAL_COMMUNITY)
Admission: RE | Admit: 2022-09-25 | Discharge: 2022-09-25 | Disposition: A | Discharge status: Home / Self Care | Payer: PPO | Source: Ambulatory Visit | Attending: Adult Health | Admitting: Adult Health

## 2022-09-25 DIAGNOSIS — J84112 Idiopathic pulmonary fibrosis: Secondary | ICD-10-CM | POA: Diagnosis not present

## 2022-09-25 DIAGNOSIS — D869 Sarcoidosis, unspecified: Secondary | ICD-10-CM | POA: Insufficient documentation

## 2022-09-25 DIAGNOSIS — S22060A Wedge compression fracture of T7-T8 vertebra, initial encounter for closed fracture: Secondary | ICD-10-CM | POA: Diagnosis not present

## 2022-09-25 DIAGNOSIS — S2220XA Unspecified fracture of sternum, initial encounter for closed fracture: Secondary | ICD-10-CM | POA: Diagnosis not present

## 2022-09-28 ENCOUNTER — Ambulatory Visit (HOSPITAL_COMMUNITY): Payer: PPO
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
## 2022-10-12 ENCOUNTER — Ambulatory Visit: Payer: PPO | Admitting: Adult Health

## 2022-10-12 ENCOUNTER — Encounter: Payer: Self-pay | Admitting: Adult Health

## 2022-10-12 VITALS — BP 114/62 | HR 64 | Temp 98.4°F | Ht 63.0 in | Wt 174.2 lb

## 2022-10-12 DIAGNOSIS — R058 Other specified cough: Secondary | ICD-10-CM

## 2022-10-12 DIAGNOSIS — G4733 Obstructive sleep apnea (adult) (pediatric): Secondary | ICD-10-CM

## 2022-10-12 DIAGNOSIS — D869 Sarcoidosis, unspecified: Secondary | ICD-10-CM

## 2022-10-12 NOTE — Assessment & Plan Note (Signed)
Encouraged on CPAP compliance.  Plan  Patient Instructions  Saline nasal spray Twice daily   Saline nasal gel At bedtime   Flonase 2 puff daily as needed  Zyrtec 10 mg daily as needed.  Delsym 2 tsp Twice daily  As needed cough.  Prilosec '20mg'$  daily As needed   Continue on CPAP At bedtime  , wear all night long Follow up in 4 months with Dr. Halford Chessman  with PFT  Please contact office for sooner follow up if symptoms do not improve or worsen or seek emergency care    '

## 2022-10-12 NOTE — Assessment & Plan Note (Signed)
Improved on current regimen -continue with trigger prevention.  Plan Patient Instructions  Saline nasal spray Twice daily   Saline nasal gel At bedtime   Flonase 2 puff daily as needed  Zyrtec 10 mg daily as needed.  Delsym 2 tsp Twice daily  As needed cough.  Prilosec '20mg'$  daily As needed   Continue on CPAP At bedtime  , wear all night long Follow up in 4 months with Dr. Halford Chessman  with PFT  Please contact office for sooner follow up if symptoms do not improve or worsen or seek emergency care
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
## 2022-10-12 NOTE — Patient Instructions (Addendum)
Saline nasal spray Twice daily   Saline nasal gel At bedtime   Flonase 2 puff daily as needed  Zyrtec 10 mg daily as needed.  Delsym 2 tsp Twice daily  As needed cough.  Prilosec '20mg'$  daily As needed   Continue on CPAP At bedtime  , wear all night long Follow up in 4 months with Dr. Halford Chessman  with PFT  Please contact office for sooner follow up if symptoms do not improve or worsen or seek emergency care

## 2022-10-12 NOTE — Assessment & Plan Note (Signed)
History of pulmonary sarcoidosis -possible mild flare last visit.  Symptoms improved with treatment aimed at upper airway cough and short course of steroids.  Chest x-ray showed increased interstitial markings.  High-resolution CT chest showed some patchy groundglass opacities possibly progressive since 2019 consistent with alternative diagnosis not UIP.  Symptoms are improved.  Recent labs showed normal calcium level.  We will check PFTs on return.  Plan  Patient Instructions  Saline nasal spray Twice daily   Saline nasal gel At bedtime   Flonase 2 puff daily as needed  Zyrtec 10 mg daily as needed.  Delsym 2 tsp Twice daily  As needed cough.  Prilosec '20mg'$  daily As needed   Continue on CPAP At bedtime  , wear all night long Follow up in 4 months with Dr. Halford Chessman  with PFT  Please contact office for sooner follow up if symptoms do not improve or worsen or seek emergency care

## 2022-10-13 DIAGNOSIS — H353230 Exudative age-related macular degeneration, bilateral, stage unspecified: Secondary | ICD-10-CM | POA: Diagnosis not present

## 2022-10-14 NOTE — Progress Notes (Signed)
Reviewed and agree with assessment/plan.   Chesley Mires, MD [facility] Pulmonary/Critical Care 10/14/2022, 9:17 AM Pager:  [phone]

## 2022-10-29 DIAGNOSIS — G2581 Restless legs syndrome: Secondary | ICD-10-CM | POA: Diagnosis not present

## 2022-10-29 DIAGNOSIS — R569 Unspecified convulsions: Secondary | ICD-10-CM | POA: Diagnosis not present

## 2022-11-02 ENCOUNTER — Encounter: Payer: Self-pay | Admitting: *Deleted

## 2022-11-02 ENCOUNTER — Telehealth: Payer: Self-pay | Admitting: *Deleted

## 2022-11-02 NOTE — Patient Instructions (Signed)
Visit Information  Thank you for taking time to visit with me today. Please don't hesitate to contact me if I can be of assistance to you.   Following are the goals we discussed today:   Goals Addressed               This Visit's Progress     COMPLETED: No needs (pt-stated)        Care Coordination Interventions: Reviewed medications with patient and discussed adherence with no needed refills Reviewed scheduled/upcoming provider appointments including pending appointments Screening for signs and symptoms of depression related to chronic disease state  Assessed social determinant of health barriers         Please call the care guide team at [phone] if you need to cancel or reschedule your appointment.   If you are experiencing a Mental Health or [place] or need someone to talk to, please call the Suicide and Crisis Lifeline: 988 call the [place] National Suicide Prevention Lifeline: 705-841-9931 or TTY: 785-805-8614 TTY (205) 620-6718) to talk to a trained counselor call 1-800-273-TALK (toll free, 24 hour hotline)  Patient verbalizes understanding of instructions and care plan provided today and agrees to view in [place]. Active MyChart status and patient understanding of how to access instructions and care plan via MyChart confirmed with patient.     No follow up needs required at this time.    Raina Mina, RN Care Management Coordinator [place] Office [phone]

## 2022-11-02 NOTE — Patient Outreach (Signed)
  Care Coordination   Initial Visit Note   11/02/2022 Name: Grace Schmitt MRN: [id number] DOB: Jun 13, 1946  Grace Schmitt is a 76 y.o. year old female who sees Kuneff, Renee A, DO for primary care. I spoke with  [name] by phone today.  What matters to the patients health and wellness today?  No needs    Goals Addressed               This Visit's Progress     COMPLETED: No needs (pt-stated)        Care Coordination Interventions: Reviewed medications with patient and discussed adherence with no needed refills Reviewed scheduled/upcoming provider appointments including pending appointments Screening for signs and symptoms of depression related to chronic disease state  Assessed social determinant of health barriers         SDOH assessments and interventions completed:  Yes  SDOH Interventions Today    Flowsheet Row Most Recent Value  SDOH Interventions   Food Insecurity Interventions Intervention Not Indicated  Housing Interventions Intervention Not Indicated  Transportation Interventions Intervention Not Indicated  Utilities Interventions Intervention Not Indicated        Care Coordination Interventions Activated:  Yes  Care Coordination Interventions:  Yes, provided   Follow up plan: No further intervention required.   Encounter Outcome:  Pt. Visit Completed   Raina Mina, RN Care Management Coordinator [place] Office [phone]

## 2022-11-04 ENCOUNTER — Telehealth: Payer: Self-pay | Admitting: Family Medicine

## 2022-11-04 NOTE — Telephone Encounter (Signed)
Pt is having problems with filling her Repatha. She reports that she is unsure if its expired or she needs a prior auth for the medication. She would like it to go to [facility] because she said she is a part of a program that will make it discounted. Please advise patient

## 2022-11-04 NOTE — Telephone Encounter (Signed)
Advised pt to have Rx transferred

## 2022-11-11 DIAGNOSIS — E785 Hyperlipidemia, unspecified: Secondary | ICD-10-CM | POA: Diagnosis not present

## 2022-11-11 DIAGNOSIS — D509 Iron deficiency anemia, unspecified: Secondary | ICD-10-CM | POA: Diagnosis not present

## 2022-11-11 DIAGNOSIS — F419 Anxiety disorder, unspecified: Secondary | ICD-10-CM | POA: Diagnosis not present

## 2022-11-11 DIAGNOSIS — E669 Obesity, unspecified: Secondary | ICD-10-CM | POA: Diagnosis not present

## 2022-11-11 DIAGNOSIS — F1120 Opioid dependence, uncomplicated: Secondary | ICD-10-CM | POA: Diagnosis not present

## 2022-11-11 DIAGNOSIS — F324 Major depressive disorder, single episode, in partial remission: Secondary | ICD-10-CM | POA: Diagnosis not present

## 2022-11-11 DIAGNOSIS — G63 Polyneuropathy in diseases classified elsewhere: Secondary | ICD-10-CM | POA: Diagnosis not present

## 2022-11-11 DIAGNOSIS — D860 Sarcoidosis of lung: Secondary | ICD-10-CM | POA: Diagnosis not present

## 2022-11-11 DIAGNOSIS — E039 Hypothyroidism, unspecified: Secondary | ICD-10-CM | POA: Diagnosis not present

## 2022-11-11 DIAGNOSIS — H353230 Exudative age-related macular degeneration, bilateral, stage unspecified: Secondary | ICD-10-CM | POA: Diagnosis not present

## 2022-11-11 DIAGNOSIS — R569 Unspecified convulsions: Secondary | ICD-10-CM | POA: Diagnosis not present

## 2022-11-11 DIAGNOSIS — I739 Peripheral vascular disease, unspecified: Secondary | ICD-10-CM | POA: Diagnosis not present

## 2022-11-25 DIAGNOSIS — G4733 Obstructive sleep apnea (adult) (pediatric): Secondary | ICD-10-CM | POA: Diagnosis not present

## 2022-12-03 DIAGNOSIS — D485 Neoplasm of uncertain behavior of skin: Secondary | ICD-10-CM | POA: Diagnosis not present

## 2022-12-03 DIAGNOSIS — H0279 Other degenerative disorders of eyelid and periocular area: Secondary | ICD-10-CM | POA: Diagnosis not present

## 2022-12-03 DIAGNOSIS — H04123 Dry eye syndrome of bilateral lacrimal glands: Secondary | ICD-10-CM | POA: Diagnosis not present

## 2022-12-03 DIAGNOSIS — H01001 Unspecified blepharitis right upper eyelid: Secondary | ICD-10-CM | POA: Diagnosis not present

## 2022-12-03 DIAGNOSIS — H01004 Unspecified blepharitis left upper eyelid: Secondary | ICD-10-CM | POA: Diagnosis not present

## 2022-12-16 ENCOUNTER — Ambulatory Visit (INDEPENDENT_AMBULATORY_CARE_PROVIDER_SITE_OTHER): Payer: PPO

## 2022-12-16 DIAGNOSIS — Z0000 Encounter for general adult medical examination without abnormal findings: Secondary | ICD-10-CM | POA: Diagnosis not present

## 2022-12-16 NOTE — Patient Instructions (Signed)
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
## 2023-01-06 ENCOUNTER — Encounter: Payer: Self-pay | Admitting: Pulmonary Disease

## 2023-01-06 ENCOUNTER — Telehealth: Payer: Self-pay | Admitting: Adult Health

## 2023-01-06 ENCOUNTER — Ambulatory Visit: Payer: PPO | Admitting: Pulmonary Disease

## 2023-01-06 VITALS — BP 120/74 | HR 68 | Ht 63.0 in | Wt 169.0 lb

## 2023-01-06 DIAGNOSIS — J8489 Other specified interstitial pulmonary diseases: Secondary | ICD-10-CM

## 2023-01-06 DIAGNOSIS — D869 Sarcoidosis, unspecified: Secondary | ICD-10-CM | POA: Diagnosis not present

## 2023-01-06 MED ORDER — FLUTICASONE-SALMETEROL 230-21 MCG/ACT IN AERO: 2.0000 | INHALATION_SPRAY | Freq: Two times a day (BID) | RESPIRATORY_TRACT | 12 refills | Status: DC

## 2023-01-06 NOTE — Telephone Encounter (Signed)
Spk to pt appt scheduled with Dr. Erin Fulling   Nothing further
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
## 2023-01-06 NOTE — Patient Instructions (Signed)
Start advair inhaler 2 puffs twice daily - rinse mouth out after each use  We will check a hypersensitivity pneumonitis panel lab test today  We will follow up with you on the results and consider a steroid taper.   Keep scheduled appointment in February

## 2023-01-06 NOTE — Telephone Encounter (Signed)
Still sick. Got better  after Pred but now cough and wheezing are back.Please call to advise PT. Thank you.

## 2023-01-07 ENCOUNTER — Other Ambulatory Visit (HOSPITAL_COMMUNITY): Payer: Self-pay

## 2023-01-07 ENCOUNTER — Encounter: Payer: Self-pay | Admitting: Pulmonary Disease

## 2023-01-12 LAB — HYPERSENSITIVITY PNEUMONITIS
A. Pullulans Abs: NEGATIVE
A.Fumigatus #1 Abs: NEGATIVE
Micropolyspora faeni, IgG: NEGATIVE
Pigeon Serum Abs: NEGATIVE
Thermoact. Saccharii: NEGATIVE
Thermoactinomyces vulgaris, IgG: NEGATIVE

## 2023-01-13 ENCOUNTER — Telehealth: Payer: Self-pay

## 2023-01-13 ENCOUNTER — Other Ambulatory Visit (HOSPITAL_COMMUNITY): Payer: Self-pay

## 2023-01-13 NOTE — Telephone Encounter (Signed)
PA request received via CMM for Fluticasone-Salmeterol 230-21MCG/ACT aerosol  PA submitted to [place] [employer] and is pending determination.   Key: Dennard Nip

## 2023-01-14 ENCOUNTER — Telehealth: Payer: Self-pay

## 2023-01-14 NOTE — Telephone Encounter (Signed)
Patient is calling in stating she currently takes lamoTRIgine (LAMICTAL) 100 MG tablet. Kynzlie said it has been working but recently for the last week she has noticed in an increase in seizures. Said she had 9 yesterday and 2 currently this morning. Wanting advice on what to do next.

## 2023-01-14 NOTE — Telephone Encounter (Signed)
Pt c/o: seizure Missed medications?  No. Sleep deprived?  No. Has rl not anything new  Alcohol intake?  No. Increased stress? No. Any change in medication color or shape? No. Notice any trigger No Back to their usual baseline self?  No.. If no, advise go to ER Current medications prescribed by Dr. Delice Lesch: lamotrigine '100mg'$  take 2 tablets twice a day   Lorazepam for cluster seizures she has not taken it,   Message sent to the front to get her scheduled for a follow up visit because its been a while since she has been seen,

## 2023-01-14 NOTE — Telephone Encounter (Signed)
Since symptoms have been quiet for a time, if she is still having a lot, please have her take the lorazepam. If seizures don't quiet down, send message on MyChart and we can increase Lamotrigine if needed.

## 2023-01-15 NOTE — Telephone Encounter (Signed)
Pt called an informed that per Dr Delice Lesch Since symptoms have been quiet for a time, if she is still having a lot, please have her take the lorazepam. If seizures don't quiet down, send message on MyChart and we can increase Lamotrigine if needed. Pt verbalized understanding

## 2023-01-20 ENCOUNTER — Ambulatory Visit: Payer: PPO | Attending: Cardiology | Admitting: Cardiology

## 2023-01-20 ENCOUNTER — Other Ambulatory Visit: Payer: Self-pay

## 2023-01-20 VITALS — BP 126/70 | HR 72 | Ht 62.6 in | Wt 168.1 lb

## 2023-01-20 DIAGNOSIS — E559 Vitamin D deficiency, unspecified: Secondary | ICD-10-CM | POA: Diagnosis not present

## 2023-01-20 DIAGNOSIS — E039 Hypothyroidism, unspecified: Secondary | ICD-10-CM

## 2023-01-20 DIAGNOSIS — Z789 Other specified health status: Secondary | ICD-10-CM | POA: Diagnosis not present

## 2023-01-20 DIAGNOSIS — G4733 Obstructive sleep apnea (adult) (pediatric): Secondary | ICD-10-CM | POA: Diagnosis not present

## 2023-01-20 DIAGNOSIS — E782 Mixed hyperlipidemia: Secondary | ICD-10-CM

## 2023-01-20 DIAGNOSIS — I700 Atherosclerosis of aorta: Secondary | ICD-10-CM

## 2023-01-20 DIAGNOSIS — I2584 Coronary atherosclerosis due to calcified coronary lesion: Secondary | ICD-10-CM

## 2023-01-20 DIAGNOSIS — I2510 Atherosclerotic heart disease of native coronary artery without angina pectoris: Secondary | ICD-10-CM

## 2023-01-20 HISTORY — DX: Atherosclerosis of aorta: I70.0

## 2023-01-20 HISTORY — DX: Atherosclerotic heart disease of native coronary artery without angina pectoris: I25.10

## 2023-01-20 NOTE — Patient Instructions (Addendum)
Medication Instructions:  Your physician recommends that you continue on your current medications as directed. Please refer to the Current Medication list given to you today.  *If you need a refill on your cardiac medications before your next appointment, please call your pharmacy*   Lab Work: Your physician recommends that you have labs done in the office today. Your test included  basic metabolic panel, complete blood count, TSH, liver function and lipids.    [place] [address] 3rd office.  If you have labs (blood work) drawn today and your tests are completely normal, you will receive your results only by: [place] (if you have MyChart) OR A paper copy in the mail If you have any lab test that is abnormal or we need to change your treatment, we will call you to review the results.   Testing/Procedures: None ordered   Follow-Up: At [facility], you and your health needs are our priority.  As part of our continuing mission to provide you with exceptional heart care, we have created designated Provider Care Teams.  These Care Teams include your primary Cardiologist (physician) and Advanced Practice Providers (APPs -  Physician Assistants and Nurse Practitioners) who all work together to provide you with the care you need, when you need it.  We recommend signing up for the patient portal called "MyChart".  Sign up information is provided on this After Visit Summary.  MyChart is used to connect with patients for Virtual Visits (Telemedicine).  Patients are able to view lab/test results, encounter notes, upcoming appointments, etc.  Non-urgent messages can be sent to your provider as well.   To learn more about what you can do with MyChart, go to [email or web address].    Your next appointment:   9 month(s)  The format for your next appointment:   In Person  Provider:   Jyl Heinz, MD    Other Instructions none  Important Information About  [place]
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
## 2023-01-21 DIAGNOSIS — I2510 Atherosclerotic heart disease of native coronary artery without angina pectoris: Secondary | ICD-10-CM | POA: Diagnosis not present

## 2023-01-21 DIAGNOSIS — E559 Vitamin D deficiency, unspecified: Secondary | ICD-10-CM | POA: Diagnosis not present

## 2023-01-21 DIAGNOSIS — I2584 Coronary atherosclerosis due to calcified coronary lesion: Secondary | ICD-10-CM | POA: Diagnosis not present

## 2023-01-21 DIAGNOSIS — E039 Hypothyroidism, unspecified: Secondary | ICD-10-CM | POA: Diagnosis not present

## 2023-01-22 LAB — COMPREHENSIVE METABOLIC PANEL
ALT: 23 IU/L (ref 0–32)
AST: 24 IU/L (ref 0–40)
Albumin/Globulin Ratio: 1.7 (ref 1.2–2.2)
Albumin: 4.4 g/dL (ref 3.8–4.8)
Alkaline Phosphatase: 84 IU/L (ref 44–121)
BUN/Creatinine Ratio: 26 (ref 12–28)
BUN: 20 mg/dL (ref 8–27)
Bilirubin Total: 0.5 mg/dL (ref 0.0–1.2)
CO2: 24 mmol/L (ref 20–29)
Calcium: 9.7 mg/dL (ref 8.7–10.3)
Chloride: 97 mmol/L (ref 96–106)
Creatinine, Ser: 0.77 mg/dL (ref 0.57–1.00)
Globulin, Total: 2.6 g/dL (ref 1.5–4.5)
Glucose: 94 mg/dL (ref 70–99)
Potassium: 5 mmol/L (ref 3.5–5.2)
Sodium: 138 mmol/L (ref 134–144)
Total Protein: 7 g/dL (ref 6.0–8.5)
eGFR: 79 mL/min/{1.73_m2} (ref >59)

## 2023-01-22 LAB — VITAMIN D 25 HYDROXY (VIT D DEFICIENCY, FRACTURES): Vit D, 25-Hydroxy: 50.3 ng/mL (ref 30.0–100.0)

## 2023-01-22 LAB — CBC
Hematocrit: 40.5 % (ref 34.0–46.6)
Hemoglobin: 13.4 g/dL (ref 11.1–15.9)
MCH: 30.9 pg (ref 26.6–33.0)
MCHC: 33.1 g/dL (ref 31.5–35.7)
MCV: 93 fL (ref 79–97)
Platelets: 293 10*3/uL (ref 150–450)
RBC: 4.34 x10E6/uL (ref 3.77–5.28)
RDW: 11.2 % — ABNORMAL LOW (ref 11.7–15.4)
WBC: 8.5 10*3/uL (ref 3.4–10.8)

## 2023-01-22 LAB — HEMOGLOBIN A1C
Est. average glucose Bld gHb Est-mCnc: 114 mg/dL
Hgb A1c MFr Bld: 5.6 % (ref 4.8–5.6)

## 2023-01-22 LAB — LIPID PANEL
Chol/HDL Ratio: 2.7 ratio (ref 0.0–4.4)
Cholesterol, Total: 173 mg/dL (ref 100–199)
HDL: 65 mg/dL (ref >39)
LDL Chol Calc (NIH): 91 mg/dL (ref 0–99)
Triglycerides: 93 mg/dL (ref 0–149)
VLDL Cholesterol Cal: 17 mg/dL (ref 5–40)

## 2023-01-22 LAB — TSH: TSH: 3.3 u[IU]/mL (ref 0.450–4.500)

## 2023-02-02 ENCOUNTER — Ambulatory Visit: Payer: PPO | Admitting: Neurology

## 2023-02-02 ENCOUNTER — Encounter: Payer: Self-pay | Admitting: Neurology

## 2023-02-02 VITALS — BP 150/77 | HR 77 | Ht 62.0 in | Wt 169.2 lb

## 2023-02-02 DIAGNOSIS — R569 Unspecified convulsions: Secondary | ICD-10-CM

## 2023-02-02 DIAGNOSIS — R404 Transient alteration of awareness: Secondary | ICD-10-CM

## 2023-02-02 MED ORDER — LAMOTRIGINE 100 MG PO TABS: ORAL_TABLET | ORAL | 3 refills | Status: DC
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
## 2023-02-02 NOTE — Patient Instructions (Signed)
Good to see you.  Continue Lamotrigine '100mg'$ : take 2 tablets twice a day.   2. Continue to keep a calendar of the seizures. If they again increase in frequency, take the lorazepam as needed and we will plan to increase dose to '250mg'$  twice a day  3. Follow-up in 6 months, call for any changes   Seizure Precautions: 1. If medication has been prescribed for you to prevent seizures, take it exactly as directed.  Do not stop taking the medicine without talking to your doctor first, even if you have not had a seizure in a long time.   2. Avoid activities in which a seizure would cause danger to yourself or to others.  Don't operate dangerous machinery, swim alone, or climb in high or dangerous places, such as on ladders, roofs, or girders.  Do not drive unless your doctor says you may.  3. If you have any warning that you may have a seizure, lay down in a safe place where you can't hurt yourself.    4.  No driving for 6 months from last seizure, as per [facility].   Please refer to the following link on the [place] website for more information: http://[email or web address]/[email or web address]/[email or web address]   5.  Maintain good sleep hygiene. Avoid alcohol.  6.  Contact your doctor if you have any problems that may be related to the medicine you are taking.  7.  Call 911 and bring the patient back to the ED if:        A.  The seizure lasts longer than 5 minutes.       B.  The patient doesn't awaken shortly after the seizure  C.  The patient has new problems such as difficulty seeing, speaking or moving  D.  The patient was injured during the seizure  E.  The patient has a temperature over 102 F (39C)  F.  The patient vomited and now is having trouble breathing

## 2023-02-04 ENCOUNTER — Encounter: Payer: Self-pay | Admitting: Adult Health

## 2023-02-04 ENCOUNTER — Ambulatory Visit (INDEPENDENT_AMBULATORY_CARE_PROVIDER_SITE_OTHER): Payer: PPO | Admitting: Pulmonary Disease

## 2023-02-04 ENCOUNTER — Ambulatory Visit (INDEPENDENT_AMBULATORY_CARE_PROVIDER_SITE_OTHER): Payer: PPO | Admitting: Adult Health

## 2023-02-04 VITALS — BP 108/70 | HR 77 | Temp 98.0°F | Ht 62.0 in | Wt 168.6 lb

## 2023-02-04 DIAGNOSIS — G4733 Obstructive sleep apnea (adult) (pediatric): Secondary | ICD-10-CM

## 2023-02-04 DIAGNOSIS — D869 Sarcoidosis, unspecified: Secondary | ICD-10-CM

## 2023-02-04 DIAGNOSIS — J4520 Mild intermittent asthma, uncomplicated: Secondary | ICD-10-CM | POA: Diagnosis not present

## 2023-02-04 LAB — PULMONARY FUNCTION TEST
DL/VA % pred: 91 %
DL/VA: 3.79 ml/min/mmHg/L
DLCO cor % pred: 72 %
DLCO cor: 12.93 ml/min/mmHg
DLCO unc % pred: 72 %
DLCO unc: 12.93 ml/min/mmHg
FEF 25-75 Post: 2.57 L/sec
FEF 25-75 Pre: 1.55 L/sec
FEF2575-%Change-Post: 66 %
FEF2575-%Pred-Post: 177 %
FEF2575-%Pred-Pre: 106 %
FEV1-%Change-Post: 10 %
FEV1-%Pred-Post: 87 %
FEV1-%Pred-Pre: 79 %
FEV1-Post: 1.63 L
FEV1-Pre: 1.47 L
FEV1FVC-%Change-Post: 5 %
FEV1FVC-%Pred-Pre: 111 %
FEV6-%Change-Post: 5 %
FEV6-%Pred-Post: 78 %
FEV6-%Pred-Pre: 74 %
FEV6-Post: 1.85 L
FEV6-Pre: 1.76 L
FEV6FVC-%Change-Post: 0 %
FEV6FVC-%Pred-Post: 105 %
FEV6FVC-%Pred-Pre: 105 %
FVC-%Change-Post: 4 %
FVC-%Pred-Post: 74 %
FVC-%Pred-Pre: 70 %
FVC-Post: 1.85 L
FVC-Pre: 1.77 L
Post FEV1/FVC ratio: 88 %
Post FEV6/FVC ratio: 100 %
Pre FEV1/FVC ratio: 83 %
Pre FEV6/FVC Ratio: 100 %
RV % pred: 97 %
RV: 2.18 L
TLC % pred: 84 %
TLC: 4.03 L

## 2023-02-04 LAB — CBC WITH DIFFERENTIAL/PLATELET
Basophils Absolute: 0.1 10*3/uL (ref 0.0–0.1)
Basophils Relative: 0.9 % (ref 0.0–3.0)
Eosinophils Absolute: 0.7 10*3/uL (ref 0.0–0.7)
Eosinophils Relative: 8.2 % — ABNORMAL HIGH (ref 0.0–5.0)
HCT: 39.4 % (ref 36.0–46.0)
Hemoglobin: 13.3 g/dL (ref 12.0–15.0)
Lymphocytes Relative: 22.3 % (ref 12.0–46.0)
Lymphs Abs: 2 10*3/uL (ref 0.7–4.0)
MCHC: 33.7 g/dL (ref 30.0–36.0)
MCV: 96.4 fl (ref 78.0–100.0)
Monocytes Absolute: 0.6 10*3/uL (ref 0.1–1.0)
Monocytes Relative: 6.8 % (ref 3.0–12.0)
Neutro Abs: 5.5 10*3/uL (ref 1.4–7.7)
Neutrophils Relative %: 61.8 % (ref 43.0–77.0)
Platelets: 317 10*3/uL (ref 150.0–400.0)
RBC: 4.09 Mil/uL (ref 3.87–5.11)
RDW: 13.2 % (ref 11.5–15.5)
WBC: 8.9 10*3/uL (ref 4.0–10.5)

## 2023-02-04 MED ORDER — ALBUTEROL SULFATE HFA 108 (90 BASE) MCG/ACT IN AERS: 1.0000 | INHALATION_SPRAY | Freq: Four times a day (QID) | RESPIRATORY_TRACT | 2 refills | Status: DC | PRN

## 2023-02-04 NOTE — Patient Instructions (Signed)
Full PFT Performed Today. 

## 2023-02-04 NOTE — Patient Instructions (Addendum)
Begin Albuterol inhaler 1-2 puffs every 6hr as needed.  Saline nasal spray Twice daily   Saline nasal gel At bedtime   Flonase 2 puff daily as needed  Claritin 10 mg daily as needed.  Delsym 2 tsp Twice daily  As needed cough.  Prilosec '20mg'$  daily As needed   Labs today  Continue on CPAP At bedtime  , wear all night long Follow up in 3-4 months with Dr. Lake Bells and  As needed   Please contact office for sooner follow up if symptoms do not improve or worsen or seek emergency care

## 2023-02-04 NOTE — Progress Notes (Signed)
Patient seen in the office today and instructed on use of albuterol.  Patient expressed understanding and demonstrated technique.

## 2023-02-04 NOTE — Progress Notes (Signed)
Full PFT Performed Today. 
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
## 2023-02-05 ENCOUNTER — Other Ambulatory Visit: Payer: Self-pay | Admitting: Neurology

## 2023-02-05 ENCOUNTER — Encounter: Payer: Self-pay | Admitting: Neurology

## 2023-02-05 DIAGNOSIS — J45909 Unspecified asthma, uncomplicated: Secondary | ICD-10-CM

## 2023-02-05 HISTORY — DX: Unspecified asthma, uncomplicated: J45.909

## 2023-02-05 LAB — RESPIRATORY ALLERGY PROFILE REGION II ~~LOC~~

## 2023-02-05 LAB — INTERPRETATION:

## 2023-02-05 MED ORDER — LAMOTRIGINE 100 MG PO TABS: ORAL_TABLET | ORAL | 3 refills | Status: DC

## 2023-02-05 NOTE — Assessment & Plan Note (Signed)
Sarcoid-appears to be stable.  PFTs show no stable lung function. CT chest in September 2023 showed some possible progressive groundglass opacities.  Questionable mild fibrotic hypersensitivity night.  Will continue to monitor. Hypersensitivity panel was negative  Plan  Patient Instructions  Begin Albuterol inhaler 1-2 puffs every 6hr as needed.  Saline nasal spray Twice daily   Saline nasal gel At bedtime   Flonase 2 puff daily as needed  Claritin 10 mg daily as needed.  Delsym 2 tsp Twice daily  As needed cough.  Prilosec 15m daily As needed   Labs today  Continue on CPAP At bedtime  , wear all night long Follow up in 3-4 months with Dr. MLake Bellsand  As needed   Please contact office for sooner follow up if symptoms do not improve or worsen or seek emergency care

## 2023-02-05 NOTE — Assessment & Plan Note (Signed)
Possible mild intermittent asthma versus reactive airways.  Patient has a history of chronic allergies.  Will check allergy panel/IgE.  Continue with trigger prevention.  Add in albuterol as needed  Plan  Patient Instructions  Begin Albuterol inhaler 1-2 puffs every 6hr as needed.  Saline nasal spray Twice daily   Saline nasal gel At bedtime   Flonase 2 puff daily as needed  Claritin 10 mg daily as needed.  Delsym 2 tsp Twice daily  As needed cough.  Prilosec 83m daily As needed   Labs today  Continue on CPAP At bedtime  , wear all night long Follow up in 3-4 months with Dr. MLake Bellsand  As needed   Please contact office for sooner follow up if symptoms do not improve or worsen or seek emergency care

## 2023-02-05 NOTE — Assessment & Plan Note (Signed)
Continue on CPAP at bedtime  Plan  Patient Instructions  Begin Albuterol inhaler 1-2 puffs every 6hr as needed.  Saline nasal spray Twice daily   Saline nasal gel At bedtime   Flonase 2 puff daily as needed  Claritin 10 mg daily as needed.  Delsym 2 tsp Twice daily  As needed cough.  Prilosec 56m daily As needed   Labs today  Continue on CPAP At bedtime  , wear all night long Follow up in 3-4 months with Dr. MLake Bellsand  As needed   Please contact office for sooner follow up if symptoms do not improve or worsen or seek emergency care

## 2023-02-08 NOTE — Progress Notes (Signed)
Reviewed, agree 

## 2023-02-09 NOTE — Progress Notes (Signed)
Called and spoke with patient.  Advised of results/recommendations per Rexene Edison NP.  She verbalized understanding.  Nothing further needed.

## 2023-02-23 ENCOUNTER — Ambulatory Visit: Payer: PPO | Admitting: Cardiology

## 2023-03-03 ENCOUNTER — Other Ambulatory Visit (HOSPITAL_COMMUNITY): Payer: Self-pay

## 2023-03-03 NOTE — Telephone Encounter (Signed)
PA DENIED due to non-medically accepted DX.

## 2023-03-09 DIAGNOSIS — N8111 Cystocele, midline: Secondary | ICD-10-CM | POA: Diagnosis not present

## 2023-03-09 DIAGNOSIS — Z01419 Encounter for gynecological examination (general) (routine) without abnormal findings: Secondary | ICD-10-CM | POA: Diagnosis not present

## 2023-03-09 DIAGNOSIS — Z6830 Body mass index (BMI) 30.0-30.9, adult: Secondary | ICD-10-CM | POA: Diagnosis not present

## 2023-03-09 DIAGNOSIS — N952 Postmenopausal atrophic vaginitis: Secondary | ICD-10-CM | POA: Diagnosis not present

## 2023-04-20 ENCOUNTER — Telehealth: Payer: Self-pay | Admitting: Cardiology

## 2023-04-20 NOTE — Telephone Encounter (Signed)
Spoke with pt who reports she has been having intermittent chest tightness that is non radiating. Pt denies radiation of pain, nausea/vomiting, shortness of breath or diaphoresis. Appointment made for 05/04/23. Advised to call 911 and go to the ED for return of pain or additional symptoms as they are concerning for MI. Offered appointment for pt for today but she declined. Pt verbalized understanding and had no additional questions

## 2023-04-20 NOTE — Telephone Encounter (Signed)
Pt c/o of Chest Pain: STAT if CP now or developed within 24 hours  1. Are you having CP right now?  Patient mentions slight discomfort in her chest, but no symptoms currently  2. Are you experiencing any other symptoms (ex. SOB, nausea, vomiting, sweating)?  No   3. How long have you been experiencing CP?  Has been occurring for the past few weeks  4. Is your CP continuous or coming and going?  Coming and going   5. Have you taken Nitroglycerin?   No  ?

## 2023-04-24 ENCOUNTER — Other Ambulatory Visit: Payer: Self-pay

## 2023-04-24 ENCOUNTER — Emergency Department (HOSPITAL_COMMUNITY): Payer: PPO

## 2023-04-24 ENCOUNTER — Encounter (HOSPITAL_COMMUNITY): Payer: Self-pay

## 2023-04-24 ENCOUNTER — Inpatient Hospital Stay (HOSPITAL_COMMUNITY)
Admission: EM | Admit: 2023-04-24 | Discharge: 2023-04-29 | DRG: 244 | Disposition: A | Discharge status: Home / Self Care | Payer: PPO | Attending: Cardiology | Admitting: Cardiology

## 2023-04-24 DIAGNOSIS — H6522 Chronic serous otitis media, left ear: Secondary | ICD-10-CM | POA: Diagnosis not present

## 2023-04-24 DIAGNOSIS — M797 Fibromyalgia: Secondary | ICD-10-CM | POA: Diagnosis not present

## 2023-04-24 DIAGNOSIS — I495 Sick sinus syndrome: Secondary | ICD-10-CM | POA: Diagnosis not present

## 2023-04-24 DIAGNOSIS — Z8249 Family history of ischemic heart disease and other diseases of the circulatory system: Secondary | ICD-10-CM

## 2023-04-24 DIAGNOSIS — Z7982 Long term (current) use of aspirin: Secondary | ICD-10-CM

## 2023-04-24 DIAGNOSIS — Z79890 Hormone replacement therapy (postmenopausal): Secondary | ICD-10-CM | POA: Diagnosis not present

## 2023-04-24 DIAGNOSIS — R55 Syncope and collapse: Secondary | ICD-10-CM | POA: Diagnosis not present

## 2023-04-24 DIAGNOSIS — G40909 Epilepsy, unspecified, not intractable, without status epilepticus: Secondary | ICD-10-CM

## 2023-04-24 DIAGNOSIS — Z8619 Personal history of other infectious and parasitic diseases: Secondary | ICD-10-CM

## 2023-04-24 DIAGNOSIS — Z950 Presence of cardiac pacemaker: Secondary | ICD-10-CM | POA: Diagnosis not present

## 2023-04-24 DIAGNOSIS — E782 Mixed hyperlipidemia: Secondary | ICD-10-CM | POA: Diagnosis not present

## 2023-04-24 DIAGNOSIS — I10 Essential (primary) hypertension: Secondary | ICD-10-CM | POA: Diagnosis not present

## 2023-04-24 DIAGNOSIS — Z79891 Long term (current) use of opiate analgesic: Secondary | ICD-10-CM

## 2023-04-24 DIAGNOSIS — J439 Emphysema, unspecified: Secondary | ICD-10-CM | POA: Diagnosis not present

## 2023-04-24 DIAGNOSIS — Z79899 Other long term (current) drug therapy: Secondary | ICD-10-CM

## 2023-04-24 DIAGNOSIS — R000 Tachycardia, unspecified: Secondary | ICD-10-CM | POA: Diagnosis not present

## 2023-04-24 DIAGNOSIS — G2581 Restless legs syndrome: Secondary | ICD-10-CM | POA: Diagnosis present

## 2023-04-24 DIAGNOSIS — I358 Other nonrheumatic aortic valve disorders: Secondary | ICD-10-CM | POA: Diagnosis not present

## 2023-04-24 DIAGNOSIS — Z6830 Body mass index (BMI) 30.0-30.9, adult: Secondary | ICD-10-CM | POA: Diagnosis not present

## 2023-04-24 DIAGNOSIS — R001 Bradycardia, unspecified: Secondary | ICD-10-CM | POA: Diagnosis present

## 2023-04-24 DIAGNOSIS — M8580 Other specified disorders of bone density and structure, unspecified site: Secondary | ICD-10-CM | POA: Diagnosis present

## 2023-04-24 DIAGNOSIS — E559 Vitamin D deficiency, unspecified: Secondary | ICD-10-CM | POA: Diagnosis present

## 2023-04-24 DIAGNOSIS — Z86010 Personal history of colonic polyps: Secondary | ICD-10-CM

## 2023-04-24 DIAGNOSIS — E039 Hypothyroidism, unspecified: Secondary | ICD-10-CM | POA: Diagnosis not present

## 2023-04-24 DIAGNOSIS — E785 Hyperlipidemia, unspecified: Secondary | ICD-10-CM | POA: Diagnosis present

## 2023-04-24 DIAGNOSIS — H9012 Conductive hearing loss, unilateral, left ear, with unrestricted hearing on the contralateral side: Secondary | ICD-10-CM | POA: Diagnosis present

## 2023-04-24 DIAGNOSIS — F32A Depression, unspecified: Secondary | ICD-10-CM | POA: Diagnosis not present

## 2023-04-24 DIAGNOSIS — Z83438 Family history of other disorder of lipoprotein metabolism and other lipidemia: Secondary | ICD-10-CM

## 2023-04-24 DIAGNOSIS — E669 Obesity, unspecified: Secondary | ICD-10-CM | POA: Diagnosis present

## 2023-04-24 DIAGNOSIS — D869 Sarcoidosis, unspecified: Secondary | ICD-10-CM | POA: Diagnosis present

## 2023-04-24 DIAGNOSIS — D71 Functional disorders of polymorphonuclear neutrophils: Secondary | ICD-10-CM | POA: Diagnosis not present

## 2023-04-24 DIAGNOSIS — G4733 Obstructive sleep apnea (adult) (pediatric): Secondary | ICD-10-CM | POA: Diagnosis not present

## 2023-04-24 DIAGNOSIS — I2510 Atherosclerotic heart disease of native coronary artery without angina pectoris: Secondary | ICD-10-CM | POA: Diagnosis present

## 2023-04-24 DIAGNOSIS — J45909 Unspecified asthma, uncomplicated: Secondary | ICD-10-CM | POA: Diagnosis not present

## 2023-04-24 DIAGNOSIS — I700 Atherosclerosis of aorta: Secondary | ICD-10-CM | POA: Diagnosis present

## 2023-04-24 DIAGNOSIS — H9312 Tinnitus, left ear: Secondary | ICD-10-CM | POA: Diagnosis present

## 2023-04-24 DIAGNOSIS — I959 Hypotension, unspecified: Secondary | ICD-10-CM | POA: Diagnosis not present

## 2023-04-24 DIAGNOSIS — Z888 Allergy status to other drugs, medicaments and biological substances status: Secondary | ICD-10-CM

## 2023-04-24 HISTORY — DX: Bradycardia, unspecified: R00.1

## 2023-04-24 LAB — CBC WITH DIFFERENTIAL/PLATELET
Abs Immature Granulocytes: 0.02 10*3/uL (ref 0.00–0.07)
Basophils Absolute: 0.1 10*3/uL (ref 0.0–0.1)
Basophils Relative: 1 %
Eosinophils Absolute: 0.5 10*3/uL (ref 0.0–0.5)
Eosinophils Relative: 7 %
HCT: 36.2 % (ref 36.0–46.0)
Hemoglobin: 12.1 g/dL (ref 12.0–15.0)
Immature Granulocytes: 0 %
Lymphocytes Relative: 21 %
Lymphs Abs: 1.5 10*3/uL (ref 0.7–4.0)
MCH: 32.6 pg (ref 26.0–34.0)
MCHC: 33.4 g/dL (ref 30.0–36.0)
MCV: 97.6 fL (ref 80.0–100.0)
Monocytes Absolute: 0.7 10*3/uL (ref 0.1–1.0)
Monocytes Relative: 9 %
Neutro Abs: 4.4 10*3/uL (ref 1.7–7.7)
Neutrophils Relative %: 62 %
Platelets: 225 10*3/uL (ref 150–400)
RBC: 3.71 MIL/uL — ABNORMAL LOW (ref 3.87–5.11)
RDW: 12.6 % (ref 11.5–15.5)
WBC: 7.1 10*3/uL (ref 4.0–10.5)
nRBC: 0 % (ref 0.0–0.2)

## 2023-04-24 LAB — URINALYSIS, ROUTINE W REFLEX MICROSCOPIC
Bilirubin Urine: NEGATIVE
Glucose, UA: NEGATIVE mg/dL
Hgb urine dipstick: NEGATIVE
Ketones, ur: NEGATIVE mg/dL
Nitrite: NEGATIVE
Protein, ur: NEGATIVE mg/dL
Specific Gravity, Urine: 1.01 (ref 1.005–1.030)
WBC, UA: >50 WBC/hpf (ref 0–5)
pH: 8 (ref 5.0–8.0)

## 2023-04-24 LAB — HEPATIC FUNCTION PANEL
ALT: 15 U/L (ref 0–44)
AST: 26 U/L (ref 15–41)
Albumin: 3.3 g/dL — ABNORMAL LOW (ref 3.5–5.0)
Alkaline Phosphatase: 47 U/L (ref 38–126)
Bilirubin, Direct: 0.3 mg/dL — ABNORMAL HIGH (ref 0.0–0.2)
Indirect Bilirubin: 0.4 mg/dL (ref 0.3–0.9)
Total Bilirubin: 0.7 mg/dL (ref 0.3–1.2)
Total Protein: 6.1 g/dL — ABNORMAL LOW (ref 6.5–8.1)

## 2023-04-24 LAB — I-STAT CHEM 8, ED
BUN: 20 mg/dL (ref 8–23)
Calcium, Ion: 1.09 mmol/L — ABNORMAL LOW (ref 1.15–1.40)
Chloride: 102 mmol/L (ref 98–111)
Creatinine, Ser: 0.9 mg/dL (ref 0.44–1.00)
Glucose, Bld: 101 mg/dL — ABNORMAL HIGH (ref 70–99)
HCT: 38 % (ref 36.0–46.0)
Hemoglobin: 12.9 g/dL (ref 12.0–15.0)
Potassium: 4.4 mmol/L (ref 3.5–5.1)
Sodium: 136 mmol/L (ref 135–145)
TCO2: 26 mmol/L (ref 22–32)

## 2023-04-24 LAB — BASIC METABOLIC PANEL
Anion gap: 7 (ref 5–15)
BUN: 17 mg/dL (ref 8–23)
CO2: 26 mmol/L (ref 22–32)
Calcium: 8.1 mg/dL — ABNORMAL LOW (ref 8.9–10.3)
Chloride: 101 mmol/L (ref 98–111)
Creatinine, Ser: 0.93 mg/dL (ref 0.44–1.00)
GFR, Estimated: >60 mL/min (ref >60)
Glucose, Bld: 106 mg/dL — ABNORMAL HIGH (ref 70–99)
Potassium: 4.6 mmol/L (ref 3.5–5.1)
Sodium: 134 mmol/L — ABNORMAL LOW (ref 135–145)

## 2023-04-24 LAB — TROPONIN I (HIGH SENSITIVITY)
Troponin I (High Sensitivity): 2 ng/L (ref <18)
Troponin I (High Sensitivity): 3 ng/L (ref <18)

## 2023-04-24 LAB — TSH: TSH: 7.319 u[IU]/mL — ABNORMAL HIGH (ref 0.350–4.500)

## 2023-04-24 LAB — MAGNESIUM: Magnesium: 2.3 mg/dL (ref 1.7–2.4)

## 2023-04-24 LAB — T4, FREE: Free T4: 1.01 ng/dL (ref 0.61–1.12)

## 2023-04-24 MED ORDER — ALBUTEROL SULFATE HFA 108 (90 BASE) MCG/ACT IN AERS: 1.0000 | INHALATION_SPRAY | Freq: Four times a day (QID) | RESPIRATORY_TRACT | Status: DC | PRN

## 2023-04-24 MED ORDER — ASPIRIN 81 MG PO TBEC
81.0000 mg | DELAYED_RELEASE_TABLET | Freq: Every day | ORAL | Status: DC
  Administered 2023-04-24 – 2023-04-29 (×6): 81 mg via ORAL
  Filled 2023-04-24 (×6): qty 1

## 2023-04-24 MED ORDER — VITAMIN D 25 MCG (1000 UNIT) PO TABS
2500.0000 ug | ORAL_TABLET | Freq: Every day | ORAL | Status: DC
  Filled 2023-04-24: qty 3
  Filled 2023-04-24: qty 1

## 2023-04-24 MED ORDER — NITROGLYCERIN 0.4 MG SL SUBL: 0.4000 mg | SUBLINGUAL_TABLET | SUBLINGUAL | Status: DC | PRN

## 2023-04-24 MED ORDER — DOCUSATE SODIUM 100 MG PO CAPS
100.0000 mg | ORAL_CAPSULE | Freq: Every day | ORAL | Status: DC
  Administered 2023-04-24 – 2023-04-29 (×5): 100 mg via ORAL
  Filled 2023-04-24 (×6): qty 1

## 2023-04-24 MED ORDER — ACETAMINOPHEN 325 MG PO TABS
650.0000 mg | ORAL_TABLET | ORAL | Status: DC | PRN
  Administered 2023-04-25 – 2023-04-29 (×7): 650 mg via ORAL
  Filled 2023-04-24 (×7): qty 2

## 2023-04-24 MED ORDER — GABAPENTIN 300 MG PO CAPS
300.0000 mg | ORAL_CAPSULE | Freq: Every day | ORAL | Status: DC
  Administered 2023-04-24 – 2023-04-28 (×5): 300 mg via ORAL
  Filled 2023-04-24 (×6): qty 1

## 2023-04-24 MED ORDER — HEPARIN SODIUM (PORCINE) 5000 UNIT/ML IJ SOLN
5000.0000 [IU] | Freq: Three times a day (TID) | INTRAMUSCULAR | Status: AC
  Administered 2023-04-24 – 2023-04-25 (×5): 5000 [IU] via SUBCUTANEOUS
  Filled 2023-04-24 (×5): qty 1

## 2023-04-24 MED ORDER — OYSTER SHELL CALCIUM/D3 500-5 MG-MCG PO TABS
1.0000 | ORAL_TABLET | Freq: Every day | ORAL | Status: DC
  Administered 2023-04-24 – 2023-04-29 (×6): 1 via ORAL
  Filled 2023-04-24 (×6): qty 1

## 2023-04-24 MED ORDER — LEVOTHYROXINE SODIUM 75 MCG PO TABS
75.0000 ug | ORAL_TABLET | Freq: Every day | ORAL | Status: DC
  Administered 2023-04-25 – 2023-04-29 (×5): 75 ug via ORAL
  Filled 2023-04-24 (×5): qty 1

## 2023-04-24 MED ORDER — ALBUTEROL SULFATE (2.5 MG/3ML) 0.083% IN NEBU: 2.5000 mg | INHALATION_SOLUTION | Freq: Four times a day (QID) | RESPIRATORY_TRACT | Status: DC | PRN

## 2023-04-24 MED ORDER — ONDANSETRON HCL 4 MG/2ML IJ SOLN: 4.0000 mg | Freq: Four times a day (QID) | INTRAMUSCULAR | Status: DC | PRN

## 2023-04-24 MED ORDER — FLUTICASONE PROPIONATE 50 MCG/ACT NA SUSP
2.0000 | NASAL | Status: DC | PRN
  Filled 2023-04-24: qty 16

## 2023-04-24 MED ORDER — LAMOTRIGINE 100 MG PO TABS
250.0000 mg | ORAL_TABLET | Freq: Two times a day (BID) | ORAL | Status: DC
  Administered 2023-04-24 – 2023-04-29 (×11): 250 mg via ORAL
  Filled 2023-04-24 (×11): qty 3

## 2023-04-24 MED ORDER — ASPIRIN 81 MG PO TBEC: 81.0000 mg | DELAYED_RELEASE_TABLET | Freq: Every day | ORAL | Status: DC

## 2023-04-24 MED ORDER — METHADONE HCL 10 MG PO TABS
5.0000 mg | ORAL_TABLET | Freq: Two times a day (BID) | ORAL | Status: DC
  Administered 2023-04-24 – 2023-04-28 (×10): 5 mg via ORAL
  Filled 2023-04-24 (×10): qty 1

## 2023-04-24 MED ORDER — LEVOTHYROXINE SODIUM 75 MCG PO TABS: 75.0000 ug | ORAL_TABLET | Freq: Every day | ORAL | Status: DC

## 2023-04-24 MED ORDER — LORATADINE 10 MG PO TABS
10.0000 mg | ORAL_TABLET | Freq: Every day | ORAL | Status: DC
  Administered 2023-04-24 – 2023-04-28 (×5): 10 mg via ORAL
  Filled 2023-04-24 (×5): qty 1

## 2023-04-24 MED ORDER — DULOXETINE HCL 60 MG PO CPEP
60.0000 mg | ORAL_CAPSULE | Freq: Every day | ORAL | Status: DC
  Administered 2023-04-24 – 2023-04-29 (×6): 60 mg via ORAL
  Filled 2023-04-24 (×6): qty 1
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
## 2023-04-24 NOTE — ED Provider Notes (Signed)
[facility] AT [facility] Provider Note   CSN: [id number] Arrival date & time: 04/24/23  0636     History  Chief Complaint  Patient presents with   Loss of Consciousness    Grace Schmitt is a 77 y.o. female.   Loss of Consciousness Associated symptoms: weakness (Generalized)   Patient presents for syncopal episode.  Medical history includes hypothyroidism, HLD, depression, RLS, fibromyalgia, OSA, seizures, asthma.  She is not on any AV nodal agents or any blood pressure medications.  Patient reports that she was in her normal state of health yesterday.  This morning, she got up from her recliner to use the bathroom.  When she did so, she felt profoundly weak.  She states that she crumpled to the floor without falling.  She does not recall losing consciousness but states that she was told that she did.  Her husband helped her up off of the floor but she continued to feel weak and "out of it".  She denies any other associated symptoms.  When EMS arrived on scene, they noted bradycardia and hypotension. Patient was given 1 mg of atropine and 800 cc IVF prior to arrival with subsequent improvement in heart rate and blood pressure.  Currently, patient reports improved symptoms.  She does endorse continued mild generalized weakness. EMS was able to capture the following twelve-lead:     Home Medications Prior to Admission medications   Medication Sig Start Date End Date Taking? Authorizing Provider  albuterol (VENTOLIN HFA) 108 (90 Base) MCG/ACT inhaler Inhale 1-2 puffs into the lungs every 6 (six) hours as needed. 02/04/23   Parrett, Virgel Bouquet, NP  aspirin EC 81 MG tablet Take 81 mg by mouth daily.    [provider]  calcium-vitamin D (CALCIUM 500/D) 500-200 MG-UNIT tablet Take 1 tablet by mouth daily.    [provider]  Cholecalciferol (D3) 62.5 MCG (2500 UT) CHEW Chew 2,500 mcg by mouth daily.    [provider]  Coenzyme Q10  (CO Q-10) 100 MG CAPS Take 100 mg by mouth daily.    [provider]  diclofenac (VOLTAREN) 75 MG EC tablet Take 1 tablet (75 mg total) by mouth 2 (two) times daily. 08/03/22   Kuneff, Renee A, DO  docusate sodium (COLACE) 100 MG capsule Take 100 mg by mouth as needed for constipation.    [provider]  DULoxetine (CYMBALTA) 60 MG capsule Take 60 mg by mouth daily. 07/18/20   [provider]  Evolocumab (REPATHA) 140 MG/ML SOSY Inject 140 mg into the skin every 14 (fourteen) days. 08/03/22   Kuneff, Renee A, DO  fluticasone (FLONASE) 50 MCG/ACT nasal spray Place 2 sprays into both nostrils as needed for allergies. 06/12/20   [provider]  gabapentin (NEURONTIN) 300 MG capsule Take 300 mg by mouth daily.  01/27/19   [provider]  hydrocortisone 2.5 % cream Apply 1 Application topically daily. 07/22/22   [provider]  IRON, FERROUS SULFATE, PO Take 1 tablet by mouth daily.    [provider]  KRILL OIL PO Take 1 tablet by mouth daily.    [provider]  lamoTRIgine (LAMICTAL) 100 MG tablet Take 2 and 1/2 tablets twice a day (total 250mg  twice a day) 02/05/23   Van Clines, MD  levothyroxine (SYNTHROID) 50 MCG tablet Take 1 tablet (50 mcg total) by mouth daily before breakfast. 08/03/22   Kuneff, Renee A, DO  loratadine (CLARITIN) 10 MG tablet  Take 10 mg by mouth at bedtime.    [provider]  LORazepam (ATIVAN) 0.5 MG tablet Take 1 tablet as needed for seizure clusters. Do not take more than 2 a day. 02/11/21   Van Clines, MD  Magnesium Gluconate (MAGNESIUM 27 PO) Take 1,300 mg by mouth daily.    [provider]  methadone (DOLOPHINE) 5 MG tablet Take 5 mg by mouth 2 (two) times daily. For restless legs    [provider]  SODIUM FLUORIDE 5000 PPM 1.1 % PSTE Take 1 Application by mouth at bedtime. 08/13/22   [provider]  vitamin C (ASCORBIC ACID) 500 MG tablet Take 500 mg by mouth  daily.    [provider]  Wheat Dextrin (BENEFIBER PO) Take 1 Scoop by mouth daily.    [provider]  zinc gluconate 50 MG tablet Take 50 mg by mouth daily.    [provider]  OXcarbazepine ER (OXTELLAR XR) 600 MG TB24 Take 600 mg by mouth daily. 04/25/20 05/06/20  Van Clines, MD      Allergies    Lipitor [atorvastatin calcium], Phenergan [promethazine hcl], and Statins    Review of Systems   Review of Systems  Cardiovascular:  Positive for syncope.  Neurological:  Positive for syncope and weakness (Generalized).  All other systems reviewed and are negative.   Physical Exam Updated Vital Signs BP 133/67   Pulse (!) 58   Resp 16   Ht 5\' 2"  (1.575 m)   Wt 74.8 kg   SpO2 98%   BMI 30.18 kg/m  Physical Exam Vitals and nursing note reviewed.  Constitutional:      General: She is not in acute distress.    Appearance: Normal appearance. She is well-developed. She is not ill-appearing, toxic-appearing or diaphoretic.  HENT:     Head: Normocephalic and atraumatic.     Right Ear: External ear normal.     Left Ear: External ear normal.     Nose: Nose normal.     Mouth/Throat:     Mouth: Mucous membranes are moist.  Eyes:     Extraocular Movements: Extraocular movements intact.     Conjunctiva/sclera: Conjunctivae normal.  Cardiovascular:     Rate and Rhythm: Normal rate and regular rhythm.     Heart sounds: No murmur heard. Pulmonary:     Effort: Pulmonary effort is normal. No respiratory distress.     Breath sounds: Normal breath sounds. No wheezing or rales.  Chest:     Chest wall: No tenderness.  Abdominal:     General: There is no distension.     Palpations: Abdomen is soft.     Tenderness: There is no abdominal tenderness.  Musculoskeletal:        General: No swelling. Normal range of motion.     Cervical back: Normal range of motion and neck supple.     Right lower leg: No edema.     Left lower leg: No edema.  Skin:    General:  Skin is warm and dry.     Coloration: Skin is not jaundiced or pale.  Neurological:     General: No focal deficit present.     Mental Status: She is alert and oriented to person, place, and time.     Cranial Nerves: No cranial nerve deficit.     Sensory: No sensory deficit.     Motor: No weakness.     Coordination: Coordination normal.  Psychiatric:  Mood and Affect: Mood normal.        Behavior: Behavior normal.        Thought Content: Thought content normal.        Judgment: Judgment normal.     ED Results / Procedures / Treatments   Labs (all labs ordered are listed, but only abnormal results are displayed) Labs Reviewed  CBC WITH DIFFERENTIAL/PLATELET - Abnormal; Notable for the following components:      Result Value   RBC 3.71 (*)    All other components within normal limits  BASIC METABOLIC PANEL - Abnormal; Notable for the following components:   Sodium 134 (*)    Glucose, Bld 106 (*)    Calcium 8.1 (*)    All other components within normal limits  HEPATIC FUNCTION PANEL - Abnormal; Notable for the following components:   Total Protein 6.1 (*)    Albumin 3.3 (*)    Bilirubin, Direct 0.3 (*)    All other components within normal limits  TSH - Abnormal; Notable for the following components:   TSH 7.319 (*)    All other components within normal limits  URINALYSIS, ROUTINE W REFLEX MICROSCOPIC - Abnormal; Notable for the following components:   APPearance CLOUDY (*)    Leukocytes,Ua LARGE (*)    Bacteria, UA RARE (*)    Non Squamous Epithelial 0-5 (*)    All other components within normal limits  I-STAT CHEM 8, ED - Abnormal; Notable for the following components:   Glucose, Bld 101 (*)    Calcium, Ion 1.09 (*)    All other components within normal limits  MAGNESIUM  T3, FREE  T4, FREE  CBG MONITORING, ED  TROPONIN I (HIGH SENSITIVITY)  TROPONIN I (HIGH SENSITIVITY)    EKG EKG Interpretation  Date/Time:  Saturday April 24 2023 06:47:00  EDT Ventricular Rate:  98 PR Interval:  58 QRS Duration: 117 QT Interval:  402 QTC Calculation: 440 R Axis:   32 Text Interpretation: Sinus rhythm Nonspecific intraventricular conduction delay Anteroseptal infarct, old Confirmed by Nicanor Alcon, April ([id number]) on 04/24/2023 6:50:26 AM  Radiology DG Chest Portable 1 View  Result Date: 04/24/2023 CLINICAL DATA:  Syncope. EXAM: PORTABLE CHEST 1 VIEW COMPARISON:  09/15/2022 FINDINGS: Lungs are hyperexpanded. Interstitial markings are diffusely coarsened with chronic features. The cardio pericardial silhouette is enlarged. Bones are diffusely demineralized. Telemetry leads overlie the chest. IMPRESSION: Emphysema without acute cardiopulmonary findings. Electronically Signed   By: Kennith Center M.D.   On: 04/24/2023 07:28    Procedures Procedures    Medications Ordered in ED Medications - No data to display  ED Course/ Medical Decision Making/ A&P                             Medical Decision Making Amount and/or Complexity of Data Reviewed Labs: ordered.   This patient presents to the ED for concern of near syncope, this involves an extensive number of treatment options, and is a complaint that carries with it a high risk of complications and morbidity.  The differential diagnosis includes symptomatic bradycardia, vasovagal episode, polypharmacy, dehydration, other arrhythmia   Co morbidities that complicate the patient evaluation  hypothyroidism, HLD, depression, RLS, fibromyalgia, OSA, seizures, asthma   Additional history obtained:  Additional history obtained from patient's husband External records from outside source obtained and reviewed including EMR   Lab Tests:  I Ordered, and personally interpreted labs.  The pertinent results include: Mild elevation in  TSH, equivocal urinalysis, slight hypocalcemia, otherwise unremarkable results.   Imaging Studies ordered:  I ordered imaging studies including chest x-ray I  independently visualized and interpreted imaging which showed no acute findings I agree with the radiologist interpretation   Cardiac Monitoring: / EKG:  The patient was maintained on a cardiac monitor.  I personally viewed and interpreted the cardiac monitored which showed an underlying rhythm of: Sinus rhythm   Consultations Obtained:  I requested consultation with the cardiologist, Dr. Wyline Mood,  and discussed lab and imaging findings as well as pertinent plan - they recommend: Admission to cardiology   Problem List / ED Course / Critical interventions / Medication management  Patient presents after a syncopal/near syncopal episode that occurred this morning while at home.  EMS noted bradycardia and hypotension on scene.  This has improved on her arrival in the ED.  Patient also reports improved symptoms of generalized weakness.  EKG on arrival shows a sinus rhythm with PACs.  On exam, patient is well-appearing.  She is alert and oriented with no focal neurologic deficits.  No cardiac murmurs are appreciated on auscultation.  Her breathing is currently unlabored.  Laboratory workup was initiated.  Per chart review, patient is not on any AV nodal agents or blood pressure medications.  Patient confirms this.  She is followed by [facility] (Dr. Tomie China).  She was last seen in the office in January.  She did call the office 4 days ago to report intermittent chest tightness.  When discussing this with the patient, patient states that this has resolved over the past few days.  She recently had a younger brother who had a heart attack and the recent chest tightness was concerning to her.  She denies any chest tightness this morning.  Patient's initial lab work is notable for mild elevation in TSH.  T3 and T4 labs were ordered.  Patient remained asymptomatic while in the ED.  On review of cardiac monitor, she remained in normal sinus rhythm.  Her blood pressure remained normal during ED observation.  I spoke  with cardiologist on-call, Dr. Wyline Mood, who will admit to cardiology service.   Social Determinants of Health:  Lives at home with husband, has access to outpatient care         Final Clinical Impression(s) / ED Diagnoses Final diagnoses:  Near syncope  Bradycardia    Rx / DC Orders ED Discharge Orders     None         Gloris Manchester, MD 04/24/23 [phone]

## 2023-04-24 NOTE — ED Notes (Signed)
Pt provided with water and ambulated to the bathroom at this time.

## 2023-04-24 NOTE — ED Notes (Signed)
Pt ambulated to the bathroom with assistance by family member. No complications or complaints with ambulation.

## 2023-04-24 NOTE — ED Triage Notes (Signed)
Patient had a syncopal episode while sitting in a chair , upon EMS arrival patient's BP unattainable, HR 32, Atropine given in 2, 0.5mg  doses,  2 mins apart patient's HR elevated to 66bpm, NS to support BP

## 2023-04-25 ENCOUNTER — Inpatient Hospital Stay (HOSPITAL_COMMUNITY): Payer: PPO

## 2023-04-25 DIAGNOSIS — R001 Bradycardia, unspecified: Secondary | ICD-10-CM | POA: Diagnosis not present

## 2023-04-25 DIAGNOSIS — R55 Syncope and collapse: Secondary | ICD-10-CM

## 2023-04-25 LAB — BASIC METABOLIC PANEL
Anion gap: 7 (ref 5–15)
BUN: 14 mg/dL (ref 8–23)
CO2: 23 mmol/L (ref 22–32)
Calcium: 8.3 mg/dL — ABNORMAL LOW (ref 8.9–10.3)
Chloride: 98 mmol/L (ref 98–111)
Creatinine, Ser: 0.7 mg/dL (ref 0.44–1.00)
GFR, Estimated: >60 mL/min (ref >60)
Glucose, Bld: 108 mg/dL — ABNORMAL HIGH (ref 70–99)
Potassium: 4 mmol/L (ref 3.5–5.1)
Sodium: 128 mmol/L — ABNORMAL LOW (ref 135–145)

## 2023-04-25 LAB — LIPID PANEL
Cholesterol: 148 mg/dL (ref 0–200)
HDL: 49 mg/dL (ref >40)
LDL Cholesterol: 82 mg/dL (ref 0–99)
Total CHOL/HDL Ratio: 3 RATIO
Triglycerides: 86 mg/dL (ref <150)
VLDL: 17 mg/dL (ref 0–40)

## 2023-04-25 LAB — CBC
HCT: 34.4 % — ABNORMAL LOW (ref 36.0–46.0)
Hemoglobin: 11.8 g/dL — ABNORMAL LOW (ref 12.0–15.0)
MCH: 32.2 pg (ref 26.0–34.0)
MCHC: 34.3 g/dL (ref 30.0–36.0)
MCV: 93.7 fL (ref 80.0–100.0)
Platelets: 211 10*3/uL (ref 150–400)
RBC: 3.67 MIL/uL — ABNORMAL LOW (ref 3.87–5.11)
RDW: 12.5 % (ref 11.5–15.5)
WBC: 7.5 10*3/uL (ref 4.0–10.5)
nRBC: 0 % (ref 0.0–0.2)

## 2023-04-25 LAB — HEMOGLOBIN A1C
Hgb A1c MFr Bld: 5.6 % (ref 4.8–5.6)
Mean Plasma Glucose: 114.02 mg/dL

## 2023-04-25 LAB — T3, FREE: T3, Free: 3.4 pg/mL (ref 2.0–4.4)

## 2023-04-25 LAB — ECHOCARDIOGRAM COMPLETE
AR max vel: 1.9 cm2
AV Area VTI: 1.97 cm2
AV Area mean vel: 1.89 cm2
AV Mean grad: 5.3 mmHg
AV Peak grad: 10.4 mmHg
AV Vena cont: 0.3 cm
Ao pk vel: 1.61 m/s
Area-P 1/2: 3.53 cm2
Height: 62 in
P 1/2 time: 449 msec
S' Lateral: 2.7 cm
Weight: 2691.2 oz

## 2023-04-25 MED ORDER — VITAMIN D 25 MCG (1000 UNIT) PO TABS: 2500.0000 [IU] | ORAL_TABLET | Freq: Every day | ORAL | Status: DC

## 2023-04-25 MED ORDER — VITAMIN D 25 MCG (1000 UNIT) PO TABS
1000.0000 [IU] | ORAL_TABLET | Freq: Every day | ORAL | Status: DC
  Administered 2023-04-25 – 2023-04-29 (×5): 1000 [IU] via ORAL
  Filled 2023-04-25 (×5): qty 1

## 2023-04-25 NOTE — Progress Notes (Signed)
History of seizures. Patient reports having had 3+ seizures this shift. States they have been like her ones at home with the same symptoms; patient feels remembers everything but says her "head feels funny". She says in being seen by her physician they noted the seizures usually come every 3 weeks, but having these tonight is at 2 weeks which she finds odd. Wonders if her admitting heart issues and the seizures are related. Vitals stable and unchanged.

## 2023-04-25 NOTE — Progress Notes (Addendum)
Patient Name: Grace Schmitt Date of Encounter: 04/26/2023  Primary Cardiologist: Garwin Brothers, MD Electrophysiologist: New  Interval Summary   Feeling OK this am. Did have 3 "episodes" yesterday, one associated with bradycardia with P-P and R-R prolongation suggestive of increased vagal tone during the episode. Other episodes did not involve bradycardia.   Inpatient Medications    Scheduled Meds:  aspirin EC  81 mg Oral Daily   calcium-vitamin D  1 tablet Oral Daily   cholecalciferol  1,000 Units Oral Daily   docusate sodium  100 mg Oral Daily   DULoxetine  60 mg Oral Daily   gabapentin  300 mg Oral Daily   lamoTRIgine  250 mg Oral BID   levothyroxine  75 mcg Oral Q0600   loratadine  10 mg Oral QHS   methadone  5 mg Oral BID   Continuous Infusions:  PRN Meds: acetaminophen, albuterol, fluticasone, nitroGLYCERIN, ondansetron (ZOFRAN) IV   Vital Signs    Vitals:   04/25/23 1627 04/25/23 1946 04/26/23 0353 04/26/23 0811  BP: 128/71 133/68 (!) 109/56 126/63  Pulse: 71 77 72 60  Resp: 18 18 18 20   Temp: 98.9 F (37.2 C) 98.2 F (36.8 C) 98.1 F (36.7 C) 98.4 F (36.9 C)  TempSrc: Oral Oral Oral Oral  SpO2: 98% 96% 96% 94%  Weight:      Height:        Intake/Output Summary (Last 24 hours) at 04/26/2023 1117 Last data filed at 04/26/2023 0351 Gross per 24 hour  Intake 480 ml  Output --  Net 480 ml   Filed Weights   04/24/23 0649 04/24/23 1223  Weight: 74.8 kg 76.3 kg    Physical Exam    GEN- The patient is well appearing, alert and oriented x 3 today.   Lungs- Clear to ausculation bilaterally, normal work of breathing Cardiac- Regular rate and rhythm, no murmurs, rubs or gallops GI- soft, NT, ND, + BS Extremities- no clubbing or cyanosis. No edema  Telemetry    NSR 70-90s, one episode of bradycardia with P-P and R-R prolongation. (personally reviewed)  Hospital Course     Grace Schmitt is a 77 y.o. female with a hx of coronary  calcification on CT, aortic atherosclerosis, HTN, mixed HLD statin intolerant, depression, RLS, fibromyalgia, OSA, partial seizures and asthma who is being seen 04/24/2023 for the evaluation of near syncope and bradycardia at the request of Dr Durwin Nora.   Pt presented to ER 04/24/23 ( please note she called office 04/20/23 and complained of chest discomfort.but did not wish to be seen that day) after feeling well yesterday, but today she got up from recliner (she sleeps in the recliner when her RLS is bad)  to go to rest room when she felt profoundly weak.  She crumpled to the floor. Her husband helped her from across the chair but she remained weak and confused but no complete syncope.  EMS called.  No chest pain.  She was bradycardic and hypotensive.  She was given 1 mg IV atropine and 800 cc IVF. BP improved and HR improved.  3 lead strip with sinus brady   she is on methadone for RLS.   Assessment & Plan    Sinus bradycardia:  Hypervagatonia  Echo 4/28 LVEF 60-65%, normal RV, moderate mitral annual calcification, mild aortic valve calcification and mild thickening.  Had an episode of near syncope.   Resolved with atropine and IVF No prior episodes It remains unclear if she would benefit from  pacemaker implant.  We discussed the implant options versus ILR implant.   Suspect she needs further work up, and then would potentially benefit from Biotronik PPM with CLS.  Medicine team has been consulted.  I cannot a clear relationship between lamotrigine and bradycardia, though there are some cardiac warnings.   "Seizure" disorder: Per the patient and family has had a EEGs without diagnosis of abnormality.  Question if these are caused by bradycardia, or increase vagal tone and then lead to bradycardia.  She had 3 episodes yesterday, one associated with bradycardia with P-P and R-R prolongation, but not other episodes this admission have been associated with bradycardia.  She is on lamictal With negative  EEGs in the past, ? Vagal spells. HR not always affected but may be related to BP given no loss of alertness or motor function during these episodes.  Dr. Karel Jarvis recently increased her lamotrigine in the setting of these episodes.   Hypothyroidism: Currently on Synthroid.  TSH elevated.   Synthroid increased  Discussed with pt and daughter that we do not have a spot for PPM today, even if she were clearly indicated. Ok to eat from EP perspective, have asked RN to hold off for IM recommendations.    For questions or updates, please contact [facility] Please consult [email or web address] for contact info under Cardiology/STEMI.  Signed, Graciella Freer, PA-C  04/26/2023, 11:17 AM

## 2023-04-25 NOTE — Progress Notes (Signed)
Cardiologist:  Revankar EP:  [place]  Subjective:  Denies SSCP, palpitations or Dyspnea Has seizures not correlated with any arrhythmia last night   Objective:  Vitals:   04/24/23 1230 04/24/23 1634 04/24/23 2100 04/25/23 0421  BP: (!) 125/56 (!) 143/66 (!) 156/75 119/60  Pulse: (!) 57 (!) 58 69 93  Resp: 18 18 18 18   Temp: 99 F (37.2 C) 98.5 F (36.9 C) 98.8 F (37.1 C) 98.8 F (37.1 C)  TempSrc: Oral Oral Oral Oral  SpO2: 100% 96% 95% 95%  Weight:      Height:        Intake/Output from previous day: No intake or output data in the 24 hours ending 04/25/23 0736  Physical Exam:  Affect appropriate Healthy:  appears stated age HEENT: normal Neck supple with no adenopathy JVP normal no bruits no thyromegaly Lungs clear with no wheezing and good diaphragmatic motion Heart:  S1/S2 no murmur, no rub, gallop or click PMI normal Abdomen: benighn, BS positve, no tenderness, no AAA no bruit.  No HSM or HJR Distal pulses intact with no bruits No edema Neuro non-focal Skin warm and dry No muscular weakness   Lab Results: Basic Metabolic Panel: Recent Labs    04/24/23 0709 04/24/23 0724 04/25/23 0156  NA 134* 136 128*  K 4.6 4.4 4.0  CL 101 102 98  CO2 26  --  23  GLUCOSE 106* 101* 108*  BUN 17 20 14   CREATININE 0.93 0.90 0.70  CALCIUM 8.1*  --  8.3*  MG 2.3  --   --    Liver Function Tests: Recent Labs    04/24/23 0709  AST 26  ALT 15  ALKPHOS 47  BILITOT 0.7  PROT 6.1*  ALBUMIN 3.3*   No results for input(s): "LIPASE", "AMYLASE" in the last 72 hours. CBC: Recent Labs    04/24/23 0709 04/24/23 0724 04/25/23 0156  WBC 7.1  --  7.5  NEUTROABS 4.4  --   --   HGB 12.1 12.9 11.8*  HCT 36.2 38.0 34.4*  MCV 97.6  --  93.7  PLT 225  --  211   Cardiac Enzymes: No results for input(s): "CKTOTAL", "CKMB", "CKMBINDEX", "TROPONINI" in the last 72 hours. BNP: Invalid input(s): "POCBNP" D-Dimer: No results for input(s): "DDIMER" in the last 72  hours. Hemoglobin A1C: Recent Labs    04/25/23 0156  HGBA1C 5.6   Fasting Lipid Panel: Recent Labs    04/25/23 0156  CHOL 148  HDL 49  LDLCALC 82  TRIG 86  CHOLHDL 3.0   Thyroid Function Tests: Recent Labs    04/24/23 0709  TSH 7.319*   Anemia Panel: No results for input(s): "VITAMINB12", "FOLATE", "FERRITIN", "TIBC", "IRON", "RETICCTPCT" in the last 72 hours.  Imaging: DG Chest Portable 1 View  Result Date: 04/24/2023 CLINICAL DATA:  Syncope. EXAM: PORTABLE CHEST 1 VIEW COMPARISON:  09/15/2022 FINDINGS: Lungs are hyperexpanded. Interstitial markings are diffusely coarsened with chronic features. The cardio pericardial silhouette is enlarged. Bones are diffusely demineralized. Telemetry leads overlie the chest. IMPRESSION: Emphysema without acute cardiopulmonary findings. Electronically Signed   By: Kennith Center M.D.   On: 04/24/2023 07:28    Cardiac Studies:  ECG: SR rate 70's  nonspecific st changes    Telemetry: SR rate 70's   Echo: EF 55-60^  Medications:    aspirin EC  81 mg Oral Daily   calcium-vitamin D  1 tablet Oral Daily   cholecalciferol  2,500 mcg Oral Daily   docusate  sodium  100 mg Oral Daily   DULoxetine  60 mg Oral Daily   gabapentin  300 mg Oral Daily   heparin  5,000 Units Subcutaneous Q8H   lamoTRIgine  250 mg Oral BID   levothyroxine  75 mcg Oral Q0600   loratadine  10 mg Oral QHS   methadone  5 mg Oral BID      Assessment/Plan:   Grace Schmitt is a 77 y.o. female with a hx of coronary calcification on CT, aortic atherosclerosis, HTN, mixed HLD statin intolerant, depression, RLS, fibromyalgia, OSA, partial seizures and asthma who is being seen 04/24/2023 for the evaluation of near syncope and bradycardia at the request of Dr Durwin Nora.    Bradycardia:  see [facility] Dr Elberta Fortis favor PPM given presentation with junctional SB rates 30's responsive to atropine and hydration She is leaning toward doing this Discussed with daughter will make her NPO  in am and Dr Cristy Folks to see in am Seizures no obvious correlation with bradycardia but EEG normal on Keppra Thyroid continue synthroid dose increased this admission  Charlton Haws 04/25/2023, 7:36 AM

## 2023-04-26 DIAGNOSIS — R001 Bradycardia, unspecified: Secondary | ICD-10-CM

## 2023-04-26 LAB — BASIC METABOLIC PANEL
Anion gap: 8 (ref 5–15)
BUN: 10 mg/dL (ref 8–23)
CO2: 25 mmol/L (ref 22–32)
Calcium: 8.8 mg/dL — ABNORMAL LOW (ref 8.9–10.3)
Chloride: 100 mmol/L (ref 98–111)
Creatinine, Ser: 0.72 mg/dL (ref 0.44–1.00)
GFR, Estimated: >60 mL/min (ref >60)
Glucose, Bld: 99 mg/dL (ref 70–99)
Potassium: 4.2 mmol/L (ref 3.5–5.1)
Sodium: 133 mmol/L — ABNORMAL LOW (ref 135–145)

## 2023-04-26 NOTE — Assessment & Plan Note (Addendum)
-  I have reviewed this patient in the [place] Controlled Substances Reporting System.  She is receiving medications from only one provider and appears to be taking them as prescribed. -She is not at particularly high risk of opioid misuse, diversion, or overdose.  -Methadone treatment for restless legs is quite unusual and not a recommended treatment for this condition -Would consider outpatient weaning -Continue Cymbalta

## 2023-04-26 NOTE — Assessment & Plan Note (Signed)
-  Body mass index is 30.76 kg/m..  -Weight loss should be encouraged -Outpatient PCP/bariatric medicine f/u encouraged

## 2023-04-26 NOTE — Progress Notes (Incomplete)
Patient Name: Grace Schmitt Date of Encounter: 04/27/2023  Primary Cardiologist: Garwin Brothers, MD Electrophysiologist: New  Interval Summary   Feeling OK this am. One additional partial seizure yesterday. Not associated with bradycardia.   Inpatient Medications    Scheduled Meds:  aspirin EC  81 mg Oral Daily   calcium-vitamin D  1 tablet Oral Daily   cholecalciferol  1,000 Units Oral Daily   docusate sodium  100 mg Oral Daily   DULoxetine  60 mg Oral Daily   gabapentin  300 mg Oral Daily   lamoTRIgine  250 mg Oral BID   levothyroxine  75 mcg Oral Q0600   loratadine  10 mg Oral QHS   methadone  5 mg Oral BID   Continuous Infusions:  PRN Meds: acetaminophen, albuterol, fluticasone, nitroGLYCERIN, ondansetron (ZOFRAN) IV   Vital Signs    Vitals:   04/26/23 1651 04/26/23 1934 04/27/23 0600 04/27/23 0753  BP: (!) 142/62 139/68 96/60 133/61  Pulse: 69 70 (!) 58 70  Resp: 18 18 18 16   Temp: 98.5 F (36.9 C) 98.3 F (36.8 C) (!) 97.5 F (36.4 C) 98 F (36.7 C)  TempSrc: Oral Oral Oral Oral  SpO2: 95% 97% 94% 95%  Weight:      Height:        Intake/Output Summary (Last 24 hours) at 04/27/2023 0857 Last data filed at 04/26/2023 2130 Gross per 24 hour  Intake 240 ml  Output --  Net 240 ml   Filed Weights   04/24/23 0649 04/24/23 1223  Weight: 74.8 kg 76.3 kg    Physical Exam    GEN- The patient is well appearing, alert and oriented x 3 today.   Lungs- Clear to ausculation bilaterally, normal work of breathing Cardiac- Regular rate and rhythm, no murmurs, rubs or gallops GI- soft, NT, ND, + BS Extremities- no clubbing or cyanosis. No edema  Telemetry    Sinus brady / NSR 50-70s (personally reviewed)  Hospital Course    Grace Schmitt is a 77 y.o. female with a hx of coronary calcification on CT, aortic atherosclerosis, HTN, mixed HLD statin intolerant, depression, RLS, fibromyalgia, OSA, partial seizures and asthma who is being seen 04/24/2023  for the evaluation of near syncope and bradycardia at the request of Dr Eden Emms.     Assessment & Plan    Sinus bradycardia Hypervagatonia  Near syncope Two separate episodes described One described as "partial seizures / transient loss of awareness" The second that led to admission associated with bradycardia, urge to defecate/urinate, flushed feeling, diaphoresis,and hypotension.  We have discussed the possibility that pacing would not necessarily prevent these episodes.  Discussed the possibility of:  Permanent Pacing with a Bi[employer]ik CLS system Vs ILR for more clear correlation of symptoms.   Partial Seizures Transient alteration of awareness Optimized per neurology and Maisley Hainsworth likely continue.  Neurology recommends close outpatient follow up.  These have not clearly been related to bradycardia, but neuro-cardiogenic component is certainly possible.   Pt and family wish to proceed with pacemaker at next available time, with understanding that would be tomorrow at the earliest. We agree that with severe bradycardia she meets criteria, but has also discussed at length a pacemaker may not completely resolve her symptoms.   For questions or updates, please contact [facility] Please consult [email or web address] for contact info under Cardiology/STEMI.  Signed, Graciella Freer, PA-C   04/27/2023, 8:57 AM   I have seen and examined this patient with Otilio Saber.  Agree  with above, note added to reflect my findings.  Patient had possibly partial seizure yesterday.  Not associated with bradycardia.  Otherwise feels well.  Has decided she wants pacemaker implanted.  GEN: Well nourished, well developed, in no acute distress  HEENT: normal  Neck: no JVD, carotid bruits, or masses Cardiac: RRR; no murmurs, rubs, or gallops,no edema  Respiratory:  clear to auscultation bilaterally, normal work of breathing GI: soft, nontender, nondistended, + BS MS: no deformity or atrophy  Skin: warm  and dry Neuro:  Strength and sensation are intact Psych: euthymic mood, full affect   Sick sinus syndrome: Has had 2 separate episodes.  She is quite concerned about her episodes.  Bethene Hankinson plan for pacemaker implant.  Risk and benefits have been discussed.  Risk of bleeding, tamponade, infection, pneumothorax, lead dislodgment.  She understands these risks.  Jasha Hodzic make her n.p.o. after midnight tonight for pacemaker implant tomorrow. Partial seizures: Optimized by neurology.  Surya Folden follow-up in clinic. Lleyton Byers M. Hanson Medeiros MD 04/27/2023 11:11 AM

## 2023-04-26 NOTE — Assessment & Plan Note (Signed)
-  Appears to be compensated -Continue prn albuterol

## 2023-04-26 NOTE — Assessment & Plan Note (Addendum)
-  Per Dr. Wilford Corner, she has clusters of partial seizures on a frequency -She has had a complete evaluation including LTM and does not need repeat evaluation -There is no current need for neurology evaluation as inpatient, recommended to f/u with Dr. Karel Jarvis to discuss medication changes if needed -Continue gabapentin, lamotrigine -Continue prn Ativan

## 2023-04-26 NOTE — Assessment & Plan Note (Signed)
-  Patient with symptomatic junctional bradycardia -Management per cardiology, pacer is being considered -Per cardiology, the need for pacer is unrelated to the seizure episodes
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
## 2023-04-26 NOTE — Care Management (Signed)
  Transition of Care [facility]) Screening Note   Patient Details  Name: Grace Schmitt Date of Birth: November 14, 1946   Transition of Care [facility]) CM/SW Contact:    Gala Lewandowsky, RN Phone Number: 04/26/2023, 4:17 PM    Transition of Care Department [facility]) has reviewed the patient and no TOC needs have been identified at this time. Patient presented for near syncopal event. EP is following the patient. We will continue to monitor patient advancement through interdisciplinary progression rounds. If new patient transition needs arise, please place a TOC consult.

## 2023-04-26 NOTE — Assessment & Plan Note (Signed)
-  Statin intolerant (myalgias) -Continue Repatha

## 2023-04-26 NOTE — Assessment & Plan Note (Signed)
Continue Synthroid °

## 2023-04-27 DIAGNOSIS — R001 Bradycardia, unspecified: Secondary | ICD-10-CM | POA: Diagnosis not present

## 2023-04-27 LAB — LIPOPROTEIN A (LPA): Lipoprotein (a): 23 nmol/L (ref <75.0)

## 2023-04-27 NOTE — H&P (View-Only) (Signed)
Patient Name: Grace Schmitt Date of Encounter: 04/28/2023  Primary Cardiologist: Grace Brothers, MD Electrophysiologist: New to Grace. Elberta Schmitt  Interval Summary   NAEO. No further events of either type.   Inpatient Medications    Scheduled Meds:  aspirin EC  81 mg Oral Daily   calcium-vitamin D  1 tablet Oral Daily   cholecalciferol  1,000 Units Oral Daily   docusate sodium  100 mg Oral Daily   DULoxetine  60 mg Oral Daily   gabapentin  300 mg Oral Daily   lamoTRIgine  250 mg Oral BID   levothyroxine  75 mcg Oral Q0600   loratadine  10 mg Oral QHS   methadone  5 mg Oral BID   Continuous Infusions:  PRN Meds: acetaminophen, albuterol, fluticasone, nitroGLYCERIN, ondansetron (ZOFRAN) IV   Vital Signs    Vitals:   04/27/23 0753 04/27/23 1631 04/27/23 1953 04/28/23 0440  BP: 133/61 (!) 118/59 117/63 (!) 128/59  Pulse: 70 72 63 61  Resp: 16 20 18 18   Temp: 98 F (36.7 C) 98 F (36.7 C) 99 F (37.2 C) 97.9 F (36.6 C)  TempSrc: Oral Oral Oral Oral  SpO2: 95% 96% 96% 96%  Weight:      Height:        Intake/Output Summary (Last 24 hours) at 04/28/2023 0741 Last data filed at 04/27/2023 2200 Gross per 24 hour  Intake 240 ml  Output --  Net 240 ml   Filed Weights   04/24/23 0649 04/24/23 1223  Weight: 74.8 kg 76.3 kg    Physical Exam    GEN- The patient is well appearing, alert and oriented x 3 today.   Lungs- Clear to ausculation bilaterally, normal work of breathing Cardiac- Regular rate and rhythm, no murmurs, rubs or gallops GI- soft, NT, ND, + BS Extremities- no clubbing or cyanosis. No edema  Telemetry    SB/NSR 50-70s (personally reviewed)  Hospital Course    Grace Schmitt is a 77 y.o. female with a hx of coronary calcification on CT, aortic atherosclerosis, HTN, mixed HLD statin intolerant, depression, RLS, fibromyalgia, OSA, partial seizures and asthma who is being seen 04/24/2023 for the evaluation of near syncope and bradycardia at the  request of Grace Schmitt.   Assessment & Plan    Sinus bradycardia / SSS Hypervagatonia  Near syncope Two distinct episode types described One described as "partial seizures / transient loss of awareness" The second that led to admission associated with bradycardia, urge to defecate/urinate, flushed feeling, diaphoresis,and hypotension.  We have discussed the possibility that pacing would not necessarily prevent these episodes.  Explained risks, benefits, and alternatives to PPM implantation, including but not limited to bleeding, infection, pneumothorax, pericardial effusion, lead dislodgement, heart attack, stroke, or death.  Pt verbalized understanding and agrees to proceed.    Partial Seizures Transient alteration of awareness Optimized per neurology and Grace Schmitt likely continue.  Neurology recommends close outpatient follow up and no additional changes or work up as inpatient.   Tentatively planning for PPM this afternoon.  For questions or updates, please contact Grace Schmitt Please consult [email or web address] for contact info under Cardiology/STEMI.  Signed, Grace Freer, PA-C   04/28/2023, 7:41 AM   I have seen and examined this patient with Grace Schmitt.  Agree with above, note added to reflect my findings.  Currently feeling well.  Ready for pacemaker implant today.  GEN: Well nourished, well developed, in no acute distress  HEENT: normal  Neck: no JVD, carotid bruits,  or masses Cardiac: RRR; no murmurs, rubs, or gallops,no edema  Respiratory:  clear to auscultation bilaterally, normal work of breathing GI: soft, nontender, nondistended, + BS MS: no deformity or atrophy  Skin: warm and dry Neuro:  Strength and sensation are intact Psych: euthymic mood, full affect   Sick sinus syndrome: Has had multiple pauses as well as significant bradycardia.  Would benefit from pacemaker implant.  Risk and benefits have been discussed.  Risk include bleeding, tamponade, infection,  pneumothorax, lead dislodgment, stroke, MI, renal failure, death.  She understands the risks and is agreed to the procedure.  Grace Schmitt M. Grace Gimpel MD 04/28/2023 8:46 AM

## 2023-04-27 NOTE — Progress Notes (Addendum)
Patient Name: Grace Schmitt Date of Encounter: 04/28/2023  Primary Cardiologist: Rajan R Revankar, MD Electrophysiologist: New to Dr. Darius Fillingim  Interval Summary   NAEO. No further events of either type.   Inpatient Medications    Scheduled Meds:  aspirin EC  81 mg Oral Daily   calcium-vitamin D  1 tablet Oral Daily   cholecalciferol  1,000 Units Oral Daily   docusate sodium  100 mg Oral Daily   DULoxetine  60 mg Oral Daily   gabapentin  300 mg Oral Daily   lamoTRIgine  250 mg Oral BID   levothyroxine  75 mcg Oral Q0600   loratadine  10 mg Oral QHS   methadone  5 mg Oral BID   Continuous Infusions:  PRN Meds: acetaminophen, albuterol, fluticasone, nitroGLYCERIN, ondansetron (ZOFRAN) IV   Vital Signs    Vitals:   04/27/23 0753 04/27/23 1631 04/27/23 1953 04/28/23 0440  BP: 133/61 (!) 118/59 117/63 (!) 128/59  Pulse: 70 72 63 61  Resp: 16 20 18 18  Temp: 98 F (36.7 C) 98 F (36.7 C) 99 F (37.2 C) 97.9 F (36.6 C)  TempSrc: Oral Oral Oral Oral  SpO2: 95% 96% 96% 96%  Weight:      Height:        Intake/Output Summary (Last 24 hours) at 04/28/2023 0741 Last data filed at 04/27/2023 2200 Gross per 24 hour  Intake 240 ml  Output --  Net 240 ml   Filed Weights   04/24/23 0649 04/24/23 1223  Weight: 74.8 kg 76.3 kg    Physical Exam    GEN- The patient is well appearing, alert and oriented x 3 today.   Lungs- Clear to ausculation bilaterally, normal work of breathing Cardiac- Regular rate and rhythm, no murmurs, rubs or gallops GI- soft, NT, ND, + BS Extremities- no clubbing or cyanosis. No edema  Telemetry    SB/NSR 50-70s (personally reviewed)  Hospital Course    Grace Schmitt is a 77 y.o. female with a hx of coronary calcification on CT, aortic atherosclerosis, HTN, mixed HLD statin intolerant, depression, RLS, fibromyalgia, OSA, partial seizures and asthma who is being seen 04/24/2023 for the evaluation of near syncope and bradycardia at the  request of Dr Nishan.   Assessment & Plan    Sinus bradycardia / SSS Hypervagatonia  Near syncope Two distinct episode types described One described as "partial seizures / transient loss of awareness" The second that led to admission associated with bradycardia, urge to defecate/urinate, flushed feeling, diaphoresis,and hypotension.  We have discussed the possibility that pacing would not necessarily prevent these episodes.  Explained risks, benefits, and alternatives to PPM implantation, including but not limited to bleeding, infection, pneumothorax, pericardial effusion, lead dislodgement, heart attack, stroke, or death.  Pt verbalized understanding and agrees to proceed.    Partial Seizures Transient alteration of awareness Optimized per neurology and Babs Dabbs likely continue.  Neurology recommends close outpatient follow up and no additional changes or work up as inpatient.   Tentatively planning for PPM this afternoon.  For questions or updates, please contact [facility] Please consult [email or web address] for contact info under Cardiology/STEMI.  Signed, Michael Andrew Tillery, PA-C   04/28/2023, 7:41 AM   I have seen and examined this patient with Andy Tillery.  Agree with above, note added to reflect my findings.  Currently feeling well.  Ready for pacemaker implant today.  GEN: Well nourished, well developed, in no acute distress  HEENT: normal  Neck: no JVD, carotid bruits,   or masses Cardiac: RRR; no murmurs, rubs, or gallops,no edema  Respiratory:  clear to auscultation bilaterally, normal work of breathing GI: soft, nontender, nondistended, + BS MS: no deformity or atrophy  Skin: warm and dry Neuro:  Strength and sensation are intact Psych: euthymic mood, full affect   Sick sinus syndrome: Has had multiple pauses as well as significant bradycardia.  Would benefit from pacemaker implant.  Risk and benefits have been discussed.  Risk include bleeding, tamponade, infection,  pneumothorax, lead dislodgment, stroke, MI, renal failure, death.  She understands the risks and is agreed to the procedure.  Juan Kissoon M. Dezarae Mcclaran MD 04/28/2023 8:46 AM  

## 2023-04-28 ENCOUNTER — Other Ambulatory Visit: Payer: Self-pay

## 2023-04-28 ENCOUNTER — Encounter (HOSPITAL_COMMUNITY)
Admission: EM | Disposition: A | Discharge status: Home / Self Care | Payer: Self-pay | Source: Home / Self Care | Attending: Cardiology

## 2023-04-28 DIAGNOSIS — I495 Sick sinus syndrome: Principal | ICD-10-CM

## 2023-04-28 DIAGNOSIS — R001 Bradycardia, unspecified: Secondary | ICD-10-CM | POA: Diagnosis not present

## 2023-04-28 HISTORY — PX: PACEMAKER IMPLANT: EP1218

## 2023-04-28 LAB — SURGICAL PCR SCREEN
MRSA, PCR: NEGATIVE
Staphylococcus aureus: NEGATIVE

## 2023-04-28 SURGERY — PACEMAKER IMPLANT

## 2023-04-28 MED ORDER — CHLORHEXIDINE GLUCONATE 4 % EX SOLN
60.0000 mL | Freq: Once | CUTANEOUS | Status: AC
  Administered 2023-04-28: 4 via TOPICAL
  Filled 2023-04-28: qty 60

## 2023-04-28 MED ORDER — CEFAZOLIN SODIUM-DEXTROSE 2-4 GM/100ML-% IV SOLN
2.0000 g | INTRAVENOUS | Status: AC
  Administered 2023-04-28: 2 g via INTRAVENOUS
  Filled 2023-04-28: qty 100

## 2023-04-28 MED ORDER — CHLORHEXIDINE GLUCONATE 4 % EX SOLN
60.0000 mL | Freq: Once | CUTANEOUS | Status: DC
  Filled 2023-04-28: qty 60

## 2023-04-28 MED ORDER — LIDOCAINE HCL (PF) 1 % IJ SOLN
INTRAMUSCULAR | Status: DC | PRN
  Administered 2023-04-28: 60 mL

## 2023-04-28 MED ORDER — CEFAZOLIN SODIUM-DEXTROSE 2-4 GM/100ML-% IV SOLN
INTRAVENOUS | Status: AC
  Filled 2023-04-28: qty 100

## 2023-04-28 MED ORDER — SODIUM CHLORIDE 0.9 % IV SOLN: INTRAVENOUS | Status: DC

## 2023-04-28 MED ORDER — SODIUM CHLORIDE 0.9 % IV SOLN
80.0000 mg | INTRAVENOUS | Status: AC
  Administered 2023-04-28: 80 mg
  Filled 2023-04-28: qty 2

## 2023-04-28 MED ORDER — LIDOCAINE HCL (PF) 1 % IJ SOLN
INTRAMUSCULAR | Status: AC
  Filled 2023-04-28: qty 60

## 2023-04-28 MED ORDER — CEFAZOLIN SODIUM-DEXTROSE 1-4 GM/50ML-% IV SOLN
1.0000 g | Freq: Four times a day (QID) | INTRAVENOUS | Status: AC
  Administered 2023-04-28 – 2023-04-29 (×3): 1 g via INTRAVENOUS
  Filled 2023-04-28 (×3): qty 50

## 2023-04-28 MED ORDER — SODIUM CHLORIDE 0.9 % IV SOLN
INTRAVENOUS | Status: AC
  Filled 2023-04-28: qty 2

## 2023-04-28 MED ORDER — HEPARIN (PORCINE) IN NACL 1000-0.9 UT/500ML-% IV SOLN
INTRAVENOUS | Status: DC | PRN
  Administered 2023-04-28: 500 mL

## 2023-04-28 SURGICAL SUPPLY — 15 items
CABLE SURGICAL S-101-97-12 (CABLE) ×1 IMPLANT
CATH CPS LOCATOR 3D SM (CATHETERS) IMPLANT
HELIX LOCKING TOOL (MISCELLANEOUS) ×1
KIT MICROPUNCTURE NIT STIFF (SHEATH) IMPLANT
LEAD ULTIPACE 52 LPA1231/52 (Lead) IMPLANT
LEAD ULTIPACE 65 LPA1231/65 (Lead) IMPLANT
PACEMAKER ASSURITY DR-RF (Pacemaker) IMPLANT
PAD DEFIB RADIO PHYSIO CONN (PAD) ×1 IMPLANT
SHEATH 7FR PRELUDE SNAP 13 (SHEATH) IMPLANT
SHEATH 9FR PRELUDE SNAP 13 (SHEATH) IMPLANT
SHEATH PROBE COVER 6X72 (BAG) IMPLANT
SLITTER AGILIS HISPRO (INSTRUMENTS) IMPLANT
TOOL HELIX LOCKING (MISCELLANEOUS) IMPLANT
TRAY PACEMAKER INSERTION (PACKS) ×1 IMPLANT
WIRE HI TORQ VERSACORE-J 145CM (WIRE) IMPLANT

## 2023-04-28 NOTE — Discharge Instructions (Signed)
After Your Pacemaker   You have a Abbott Pacemaker  ACTIVITY Do not lift your arm above shoulder height for 1 week after your procedure. After 7 days, you may progress as below.  You should remove your sling 24 hours after your procedure, unless otherwise instructed by your provider.     Wednesday May 05, 2023  Thursday May 06, 2023 Friday May 07, 2023 Saturday May 08, 2023   Do not lift, push, pull, or carry anything over 10 pounds with the affected arm until 6 weeks (Wednesday June 09, 2023 ) after your procedure.   You may drive AFTER your wound check, unless you have been told otherwise by your provider.   Ask your healthcare provider when you can go back to work   INCISION/Dressing If you are on a blood thinner such as Coumadin, Xarelto, Eliquis, Plavix, or Pradaxa please confirm with your provider when this should be resumed.   If large square, outer bandage is left in place, this can be removed after 24 hours from your procedure. Do not remove steri-strips or glue as below.   If a PRESSURE DRESSING (a bulky dressing that usually goes up over your shoulder) was applied or left in place, please follow instructions given by your provider on when to return to have this removed.   Monitor your Pacemaker site for redness, swelling, and drainage. Call the device clinic at [phone] if you experience these symptoms or fever/chills.  If your incision is sealed with Steri-strips or staples, you may shower 7 days after your procedure or when told by your provider. Do not remove the steri-strips or let the shower hit directly on your site. You may wash around your site with soap and water.    If you were discharged in a sling, please do not wear this during the day more than 48 hours after your surgery unless otherwise instructed. This may increase the risk of stiffness and soreness in your shoulder.   Avoid lotions, ointments, or perfumes over your incision until it is  well-healed.  You may use a hot tub or a pool AFTER your wound check appointment if the incision is completely closed.  Pacemaker Alerts:  Some alerts are vibratory and others beep. These are NOT emergencies. Please call our office to let us know. If this occurs at night or on weekends, it can wait until the next business day. Send a remote transmission.  If your device is capable of reading fluid status (for heart failure), you will be offered monthly monitoring to review this with you.   DEVICE MANAGEMENT Remote monitoring is used to monitor your pacemaker from home. This monitoring is scheduled every 91 days by our office. It allows us to keep an eye on the functioning of your device to ensure it is working properly. You will routinely see your Electrophysiologist annually (more often if necessary).   You should receive your ID card for your new device in 4-8 weeks. Keep this card with you at all times once received. Consider wearing a medical alert bracelet or necklace.  Your Pacemaker may be MRI compatible. This will be discussed at your next office visit/wound check.  You should avoid contact with strong electric or magnetic fields.   Do not use amateur (ham) radio equipment or electric (arc) welding torches. MP3 player headphones with magnets should not be used. Some devices are safe to use if held at least 12 inches (30 cm) from your Pacemaker. These include power tools, lawn   mowers, and speakers. If you are unsure if something is safe to use, ask your health care provider.  When using your cell phone, hold it to the ear that is on the opposite side from the Pacemaker. Do not leave your cell phone in a pocket over the Pacemaker.  You may safely use electric blankets, heating pads, computers, and microwave ovens.  Call the office right away if: You have chest pain. You feel more short of breath than you have felt before. You feel more light-headed than you have felt before. Your  incision starts to open up.  This information is not intended to replace advice given to you by your health care provider. Make sure you discuss any questions you have with your health care provider.  

## 2023-04-28 NOTE — Interval H&P Note (Signed)
History and Physical Interval Note:  04/28/2023 8:47 AM  Grace Schmitt  has presented today for surgery, with the diagnosis of sss.  The various methods of treatment have been discussed with the patient and family. After consideration of risks, benefits and other options for treatment, the patient has consented to  Procedure(s): PACEMAKER IMPLANT (N/A) as a surgical intervention.  The patient's history has been reviewed, patient examined, no change in status, stable for surgery.  I have reviewed the patient's chart and labs.  Questions were answered to the patient's satisfaction.     Brigg Cape [employer]

## 2023-04-28 NOTE — Care Management Important Message (Signed)
Important Message  Patient Details  Name: Grace Schmitt MRN: [id number] Date of Birth: May 03, 1946   Medicare Important Message Given:  Yes     Renie Ora 04/28/2023, 11:17 AM

## 2023-04-28 NOTE — Plan of Care (Signed)
  Problem: Education: Goal: Understanding of cardiac disease, CV risk reduction, and recovery process will improve Outcome: Progressing Goal: Individualized Educational Video(s) Outcome: Progressing   Problem: Education: Goal: Individualized Educational Video(s) Outcome: Progressing

## 2023-04-29 ENCOUNTER — Inpatient Hospital Stay (HOSPITAL_COMMUNITY): Payer: PPO

## 2023-04-29 ENCOUNTER — Encounter (HOSPITAL_COMMUNITY): Payer: Self-pay | Admitting: Cardiology

## 2023-04-29 DIAGNOSIS — I495 Sick sinus syndrome: Secondary | ICD-10-CM | POA: Diagnosis not present

## 2023-04-29 NOTE — Discharge Summary (Addendum)
DISCHARGE SUMMARY    Patient ID: Grace Schmitt,  MRN: [id number], DOB/AGE: March 30, 1946 77 y.o.  Admit date: 04/24/2023 Discharge date: 04/29/2023  Primary Care Physician: Natalia Leatherwood, DO  Primary Cardiologist: Dr. Tomie China Electrophysiologist: Dr. Elberta Fortis (new)  Primary Discharge Diagnosis:  Symptomatic bradycardia status post pacemaker implantation this admission  Secondary Discharge Diagnosis:  HTN Fibromyalia RLS Seizure d/o  Allergies  Allergen Reactions   Lipitor [Atorvastatin Calcium] Other (See Comments)    Myalgias Arthralgia    Phenergan [Promethazine Hcl] Other (See Comments)    Worsens restless legs   Statins Other (See Comments)    Myalgias      Procedures This Admission:  1.  Implantation of an Abott dual chamber PPM on 04/28/23 by Dr Elberta Fortis.   There were no immediate post procedure complications. CXR on 04/29/23 demonstrated no pneumothorax status post device implantation.   Brief HPI: Grace Schmitt is a 77 y.o. female w/PMHx including above  came to the ER 04/24/23 got up from recliner (she sleeps in the recliner when her RLS is bad)  to go to rest room when she felt profoundly weak.  She crumpled to the floor. Her husband helped her from across the chair but she remained weak and confused but no complete syncope. EMS was called, fund to be bradycadic/hypotensive and given atropine, she was admitted by EP service  Hospital Course:  The patient was admitted, IM service consulted with reports of possible seizure at home, and unusual home meds for support with her non EP/cardiac issues. No changes were recommended noting methadone an unusual tx for RLS, suggested close follow up with her primary/neurologist with consideration to taper off  with no need for acute/inpatient neuro w/u.  She had recurrent spells in the hospital with telemetry findings c/w vagal mediated events.  Discussed loop as an option to monitor.   Ultimately was decided to  pursue PPM, in d/w the patient that pacer may not completely resolve all of her symptoms.  TTE noted preserved LVEF , no WMA, HS Trps negative, labs noncontributory.    She underwent implantation of a PPM with details as outlined in the procedure report.  She was monitored on telemetry overnight which demonstrated AP/VS, SR no bradycardia.  Left chest was without hematoma or ecchymosis.  The device was interrogated and found to be functioning normally.  CXR was obtained and demonstrated no pneumothorax status post device implantation.  Wound care, arm mobility, and restrictions were reviewed with the patient.  The patient feels well, denies any CP/SOB, with minimal site discomfort.  She was examined by Dr. Elberta Fortis and considered stable for discharge to home.   Patient was advised to f/u with her PMD and neurologist   Physical Exam: Vitals:   04/28/23 1647 04/28/23 1715 04/28/23 2049 04/29/23 0359  BP: (!) 149/76 127/81 115/62 (!) 99/51  Pulse: 77 70 69 75  Resp: 14 16 18 16   Temp:  98.5 F (36.9 C) 98 F (36.7 C) 98 F (36.7 C)  TempSrc:  Oral Oral Oral  SpO2: 99% 99% 96% 94%  Weight:      Height:        GEN- The patient is well appearing, alert and oriented x 3 today.   HEENT: normocephalic, atraumatic; sclera clear, conjunctiva pink; hearing intact; oropharynx clear; neck supple, no JVP Lungs-  CTA b/l, normal work of breathing.  No wheezes, rales, rhonchi Heart- RRR, no murmurs, rubs or gallops, PMI not laterally displaced  GI- soft, non-tender, non-distended Extremities- no clubbing, cyanosis, or edema MS- no significant deformity or atrophy Skin- warm and dry, no rash or lesion, left chest without hematoma/ecchymosis Psych- euthymic mood, full affect Neuro- no gross deficits   Labs:   Lab Results  Component Value Date   WBC 7.5 04/25/2023   HGB 11.8 (L) 04/25/2023   HCT 34.4 (L) 04/25/2023   MCV 93.7 04/25/2023   PLT 211 04/25/2023    Recent Labs  Lab 04/24/23 0709  04/24/23 0724 04/26/23 0827  NA 134*   < > 133*  K 4.6   < > 4.2  CL 101   < > 100  CO2 26   < > 25  BUN 17   < > 10  CREATININE 0.93   < > 0.72  CALCIUM 8.1*   < > 8.8*  PROT 6.1*  --   --   BILITOT 0.7  --   --   ALKPHOS 47  --   --   ALT 15  --   --   AST 26  --   --   GLUCOSE 106*   < > 99   < > = values in this interval not displayed.    Discharge Medications:  Allergies as of 04/29/2023       Reactions   Lipitor [atorvastatin Calcium] Other (See Comments)   Myalgias Arthralgia    Phenergan [promethazine Hcl] Other (See Comments)   Worsens restless legs   Statins Other (See Comments)   Myalgias         Medication List     TAKE these medications    albuterol 108 (90 Base) MCG/ACT inhaler Commonly known as: VENTOLIN HFA Inhale 1-2 puffs into the lungs every 6 (six) hours as needed.   ascorbic acid 500 MG tablet Commonly known as: VITAMIN C Take 500 mg by mouth daily.   aspirin EC 81 MG tablet Take 81 mg by mouth at bedtime.   BENEFIBER PO Take 1 Scoop by mouth at bedtime.   CALCIUM + VITAMIN D3 PO Take 1 tablet by mouth daily.   CO Q 10 PO Take 1 capsule by mouth daily.   diclofenac 75 MG EC tablet Commonly known as: VOLTAREN Take 1 tablet (75 mg total) by mouth 2 (two) times daily.   docusate sodium 100 MG capsule Commonly known as: COLACE Take 100 mg by mouth daily as needed for constipation.   DULoxetine 60 MG capsule Commonly known as: CYMBALTA Take 60 mg by mouth daily.   fluticasone 50 MCG/ACT nasal spray Commonly known as: FLONASE Place 2 sprays into both nostrils as needed for allergies.   gabapentin 300 MG capsule Commonly known as: NEURONTIN Take 300 mg by mouth at bedtime.   IRON (FERROUS SULFATE) PO Take 1 tablet by mouth daily.   KRILL OIL PO Take 1 capsule by mouth daily.   lamoTRIgine 100 MG tablet Commonly known as: LAMICTAL Take 2 and 1/2 tablets twice a day (total 250mg  twice a day) What changed:  how much to  take how to take this when to take this   levothyroxine 50 MCG tablet Commonly known as: SYNTHROID Take 1 tablet (50 mcg total) by mouth daily before breakfast.   loratadine 10 MG tablet Commonly known as: CLARITIN Take 10 mg by mouth daily.   LORazepam 0.5 MG tablet Commonly known as: Ativan Take 1 tablet as needed for seizure clusters. Do not take more than 2 a day. What changed:  how much  to take how to take this when to take this reasons to take this additional instructions   MAGNESIUM OXIDE PO Take 1 tablet by mouth daily.   methadone 5 MG tablet Commonly known as: DOLOPHINE Take 5 mg by mouth 2 (two) times daily. For restless legs   Repatha 140 MG/ML Sosy Generic drug: Evolocumab Inject 140 mg into the skin every 14 (fourteen) days.   VITAMIN D-3 PO Take 1 tablet by mouth daily.   ZINC PO Take 1 tablet by mouth daily.        Disposition: home Discharge Instructions     Diet - low sodium heart healthy   Complete by: As directed    Increase activity slowly   Complete by: As directed         Duration of Discharge Encounter: Greater than 30 minutes including physician time.  Norma Fredrickson, PA-C 04/29/2023 11:04 AM   I have seen and examined this patient with Francis Dowse.  Agree with above, note added to reflect my findings.  On exam, RRR, no murmurs, lungs clear.  She is now status post Abbott dual-chamber pacemaker for sick sinus syndrome.  Device functioning appropriately.  Chest x-ray and interrogation without issue.  Plan for discharge today with follow-up in device clinic.  Eric Morganti M. Olon Russ MD 04/29/2023 12:04 PM

## 2023-04-30 ENCOUNTER — Telehealth: Payer: Self-pay

## 2023-04-30 ENCOUNTER — Ambulatory Visit: Payer: PPO | Admitting: Internal Medicine

## 2023-04-30 NOTE — Transitions of Care (Post Inpatient/ED Visit) (Signed)
04/30/2023  Name: Grace Schmitt MRN: [id number] DOB: Mar 24, 1946  Today's TOC FU Call Status: Today's TOC FU Call Status:: Successful TOC FU Call Competed TOC FU Call Complete Date: 04/30/23  Transition Care Management Follow-up Telephone Call Date of Discharge: 04/29/23 Discharge Facility: [facility]) Type of Discharge: Inpatient Admission Primary Inpatient Discharge Diagnosis:: symptomatic bradycardia, pacemaker implantation How have you been since you were released from the hospital?: Better Any questions or concerns?: No  Items Reviewed: Did you receive and understand the discharge instructions provided?: Yes Medications obtained,verified, and reconciled?: No Medications Not Reviewed Reasons:: Other: (Declines full medication reconcilation but reviewed she has current medications) Any new allergies since your discharge?: No Dietary orders reviewed?: Yes Type of Diet Ordered:: cardiac Do you have support at home?: Yes People in Home: spouse Name of Support/Comfort Primary Source: husband Grace Schmitt  Medications Reviewed Today: Medications Reviewed Today     Reviewed by Yetta Glassman, RN (Registered Nurse) on 04/30/23 at 1031  Med List Status: <None>   Medication Order Taking? Sig Documenting Provider Last Dose Status Informant  albuterol (VENTOLIN HFA) 108 (90 Base) MCG/ACT inhaler [id number] No Inhale 1-2 puffs into the lungs every 6 (six) hours as needed.  Patient not taking: Reported on 04/24/2023   Julio Sicks, NP Not Taking Active Self, Spouse/Significant Other, Pharmacy Records  aspirin EC 81 MG tablet [id number] No Take 81 mg by mouth at bedtime. [provider] 04/23/2023 Active Self, Spouse/Significant Other  Calcium Carb-Cholecalciferol (CALCIUM + VITAMIN D3 PO) [id number] No Take 1 tablet by mouth daily. [provider] 04/23/2023 Active Self, Spouse/Significant Other  Cholecalciferol (VITAMIN D-3 PO) [id number] No Take 1 tablet by  mouth daily. [provider] 04/23/2023 Active Self, Spouse/Significant Other  Coenzyme Q10 (CO Q 10 PO) [id number] No Take 1 capsule by mouth daily. [provider] 04/23/2023 Active Self, Spouse/Significant Other  diclofenac (VOLTAREN) 75 MG EC tablet [id number] No Take 1 tablet (75 mg total) by mouth 2 (two) times daily. Kuneff, Renee A, DO 04/23/2023 Active Self, Spouse/Significant Other, Pharmacy Records  docusate sodium (COLACE) 100 MG capsule [id number] No Take 100 mg by mouth daily as needed for constipation. [provider] 04/23/2023 Active Self, Spouse/Significant Other  DULoxetine (CYMBALTA) 60 MG capsule [id number] No Take 60 mg by mouth daily. [provider] 04/23/2023 Active Self, Spouse/Significant Other, Pharmacy Records  Evolocumab (REPATHA) 140 MG/ML SOSY [id number] No Inject 140 mg into the skin every 14 (fourteen) days. Natalia Leatherwood, DO Past Week Active Self, Spouse/Significant Other, Pharmacy Records  fluticasone (FLONASE) 50 MCG/ACT nasal spray [id number] No Place 2 sprays into both nostrils as needed for allergies. [provider] Past Week Active Self, Spouse/Significant Other, Pharmacy Records  gabapentin (NEURONTIN) 300 MG capsule [id number] No Take 300 mg by mouth at bedtime. [provider] 04/23/2023 Active Self, Spouse/Significant Other, Pharmacy Records  IRON, FERROUS SULFATE, PO [id number] No Take 1 tablet by mouth daily. [provider] 04/23/2023 Active Self, Spouse/Significant Other  KRILL OIL PO [id number] No Take 1 capsule by mouth daily. [provider] 04/23/2023 Active Self, Spouse/Significant Other  lamoTRIgine (LAMICTAL) 100 MG tablet [id number] No Take 2 and 1/2 tablets twice a day (total 250mg  twice a day)  Patient taking differently: Take 250 mg by mouth 2 (two) times daily. Take 2 and 1/2 tablets twice a day (total 250mg  twice a day)   Van Clines, MD 04/23/2023 Active Self, Spouse/Significant  Other, Pharmacy Records  levothyroxine (SYNTHROID) 50 MCG tablet [id number]  No Take 1 tablet (50 mcg total) by mouth daily before breakfast. Kuneff, Renee A, DO 04/24/2023 Active Self, Spouse/Significant Other, Pharmacy Records  loratadine (CLARITIN) 10 MG tablet [id number] No Take 10 mg by mouth daily. [provider] 04/23/2023 Active Self, Spouse/Significant Other  LORazepam (ATIVAN) 0.5 MG tablet [id number] No Take 1 tablet as needed for seizure clusters. Do not take more than 2 a day.  Patient taking differently: Take 0.5 mg by mouth 2 (two) times daily as needed (seizure clusters).   Van Clines, MD UNK Active Self, Spouse/Significant Other, Pharmacy Records  MAGNESIUM OXIDE PO [id number] No Take 1 tablet by mouth daily. [provider] 04/23/2023 Active Self, Spouse/Significant Other  methadone (DOLOPHINE) 5 MG tablet [id number] No Take 5 mg by mouth 2 (two) times daily. For restless legs [provider] 04/23/2023 Active Self, Spouse/Significant Other, Pharmacy Records           Med Note (GARNER, TIFFANY L   Mon Jan 20, 2021 10:00 AM)    Multiple Vitamins-Minerals (ZINC PO) [id number] No Take 1 tablet by mouth daily. [provider] 04/23/2023 Active Self, Spouse/Significant Other  Discontinued 05/06/20 1439 (Discontinued by provider)   vitamin C (ASCORBIC ACID) 500 MG tablet [id number] No Take 500 mg by mouth daily. [provider] 04/23/2023 Active Self, Spouse/Significant Other  Wheat Dextrin (BENEFIBER PO) [id number] No Take 1 Scoop by mouth at bedtime. [provider] 04/23/2023 Active Self, Spouse/Significant Other            Home Care and Equipment/Supplies: Were Home Health Services Ordered?: NA Any new equipment or medical supplies ordered?: NA  Functional Questionnaire: Do you need assistance with bathing/showering or dressing?: No Do you need assistance with meal preparation?: No Do you need assistance with eating?: No Do you  have difficulty maintaining continence: No Do you need assistance with getting out of bed/getting out of a chair/moving?: No Do you have difficulty managing or taking your medications?: No  Follow up appointments reviewed: PCP Follow-up appointment confirmed?: No (Pt prefers to call to schedule PCP visit as she has appt with cardiology 05/04/23) Specialist Hospital Follow-up appointment confirmed?: Yes Date of Specialist follow-up appointment?: 05/04/23 Follow-Up Specialty Provider:: Dr. Arlington Calix cardiology Do you need transportation to your follow-up appointment?: No Do you understand care options if your condition(s) worsen?: Yes-patient verbalized understanding  SDOH Interventions Today    Flowsheet Row Most Recent Value  SDOH Interventions   Food Insecurity Interventions Intervention Not Indicated  Transportation Interventions Intervention Not Indicated      TOC Interventions Today    Flowsheet Row Most Recent Value  TOC Interventions   TOC Interventions Discussed/Reviewed TOC Interventions Discussed, Post discharge activity limitations per provider, Post op wound/incision care, S/S of infection  [Pt declines to have RNCM schedule PCP follow up but will call herself later.  Reviewed importance of follow up with provider post hospitalization]       Interventions Today    Flowsheet Row Most Recent Value  Chronic Disease   Chronic disease during today's visit Other  [bradycardia, pacemaker insertion]  General Interventions   General Interventions Discussed/Reviewed General Interventions Discussed, Doctor Visits, Community Resources  [declines referral to care coordination for follow up]  Doctor Visits Discussed/Reviewed Doctor Visits Discussed, PCP, Specialist  PCP/Specialist Visits Compliance with follow-up visit  Education Interventions   Education Provided Provided Education  Nutrition Interventions   Nutrition Discussed/Reviewed Nutrition Discussed, Decreasing salt   [Reviewed meal delivery benefit from [employer] and pt prefers to call  them herself]  Pharmacy Interventions   Pharmacy Dicussed/Reviewed Pharmacy Topics Discussed        SIGNATURE Dudley Major RN, [facility], CDE Care Management Coordinator [facility] Care Management [phone]

## 2023-05-04 ENCOUNTER — Ambulatory Visit (INDEPENDENT_AMBULATORY_CARE_PROVIDER_SITE_OTHER): Payer: PPO | Admitting: Cardiology

## 2023-05-04 ENCOUNTER — Encounter: Payer: Self-pay | Admitting: Cardiology

## 2023-05-04 VITALS — BP 118/70 | HR 70 | Ht 62.0 in | Wt 169.1 lb

## 2023-05-04 DIAGNOSIS — G4733 Obstructive sleep apnea (adult) (pediatric): Secondary | ICD-10-CM | POA: Diagnosis not present

## 2023-05-04 DIAGNOSIS — Z950 Presence of cardiac pacemaker: Secondary | ICD-10-CM

## 2023-05-04 DIAGNOSIS — I2510 Atherosclerotic heart disease of native coronary artery without angina pectoris: Secondary | ICD-10-CM | POA: Diagnosis not present

## 2023-05-04 DIAGNOSIS — E782 Mixed hyperlipidemia: Secondary | ICD-10-CM

## 2023-05-04 DIAGNOSIS — I2584 Coronary atherosclerosis due to calcified coronary lesion: Secondary | ICD-10-CM | POA: Diagnosis not present

## 2023-05-04 DIAGNOSIS — I700 Atherosclerosis of aorta: Secondary | ICD-10-CM

## 2023-05-04 DIAGNOSIS — R001 Bradycardia, unspecified: Secondary | ICD-10-CM

## 2023-05-04 HISTORY — DX: Presence of cardiac pacemaker: Z95.0
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
## 2023-05-04 NOTE — Patient Instructions (Signed)
Medication Instructions:  Your physician recommends that you continue on your current medications as directed. Please refer to the Current Medication list given to you today.  *If you need a refill on your cardiac medications before your next appointment, please call your pharmacy*   Lab Work: None ordered If you have labs (blood work) drawn today and your tests are completely normal, you will receive your results only by: MyChart Message (if you have MyChart) OR A paper copy in the mail If you have any lab test that is abnormal or we need to change your treatment, we will call you to review the results.   Testing/Procedures: None ordered   Follow-Up: At [facility], you and your health needs are our priority.  As part of our continuing mission to provide you with exceptional heart care, we have created designated Provider Care Teams.  These Care Teams include your primary Cardiologist (physician) and Advanced Practice Providers (APPs -  Physician Assistants and Nurse Practitioners) who all work together to provide you with the care you need, when you need it.  We recommend signing up for the patient portal called "MyChart".  Sign up information is provided on this After Visit Summary.  MyChart is used to connect with patients for Virtual Visits (Telemedicine).  Patients are able to view lab/test results, encounter notes, upcoming appointments, etc.  Non-urgent messages can be sent to your provider as well.   To learn more about what you can do with MyChart, go to [email or web address].    Your next appointment:   6 month(s)  The format for your next appointment:   In Person  Provider:   Rajan Revankar, MD    Other Instructions none  Important Information About Sugar       

## 2023-05-05 ENCOUNTER — Inpatient Hospital Stay: Payer: PPO | Admitting: Family Medicine
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
## 2023-05-10 ENCOUNTER — Ambulatory Visit: Payer: PPO | Admitting: Family Medicine

## 2023-05-10 ENCOUNTER — Encounter: Payer: Self-pay | Admitting: Family Medicine

## 2023-05-10 VITALS — BP 114/76 | HR 62 | Temp 98.1°F | Wt 170.0 lb

## 2023-05-10 DIAGNOSIS — E038 Other specified hypothyroidism: Secondary | ICD-10-CM | POA: Diagnosis not present

## 2023-05-10 DIAGNOSIS — D649 Anemia, unspecified: Secondary | ICD-10-CM

## 2023-05-10 DIAGNOSIS — E063 Autoimmune thyroiditis: Secondary | ICD-10-CM | POA: Diagnosis not present

## 2023-05-10 DIAGNOSIS — G2581 Restless legs syndrome: Secondary | ICD-10-CM

## 2023-05-10 DIAGNOSIS — Z950 Presence of cardiac pacemaker: Secondary | ICD-10-CM | POA: Diagnosis not present

## 2023-05-10 LAB — CBC
HCT: 36.9 % (ref 36.0–46.0)
Hemoglobin: 12.3 g/dL (ref 12.0–15.0)
MCHC: 33.4 g/dL (ref 30.0–36.0)
MCV: 96.4 fl (ref 78.0–100.0)
Platelets: 306 10*3/uL (ref 150.0–400.0)
RBC: 3.83 Mil/uL — ABNORMAL LOW (ref 3.87–5.11)
RDW: 12.9 % (ref 11.5–15.5)
WBC: 12.1 10*3/uL — ABNORMAL HIGH (ref 4.0–10.5)

## 2023-05-10 LAB — BASIC METABOLIC PANEL WITH GFR
BUN: 19 mg/dL (ref 6–23)
CO2: 31 meq/L (ref 19–32)
Calcium: 9.4 mg/dL (ref 8.4–10.5)
Chloride: 98 meq/L (ref 96–112)
Creatinine, Ser: 0.76 mg/dL (ref 0.40–1.20)
GFR: 75.28 mL/min
Glucose, Bld: 83 mg/dL (ref 70–99)
Potassium: 5.1 meq/L (ref 3.5–5.1)
Sodium: 138 meq/L (ref 135–145)

## 2023-05-10 LAB — TSH: TSH: 1.31 u[IU]/mL (ref 0.35–5.50)

## 2023-05-10 NOTE — Patient Instructions (Signed)
No follow-ups on file.        Great to see you today.  I have refilled the medication(s) we provide.   If labs were collected, we will inform you of lab results once received either by echart message or telephone call.   - echart message- for normal results that have been seen by the patient already.   - telephone call: abnormal results or if patient has not viewed results in their echart.  
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
## 2023-05-11 ENCOUNTER — Telehealth: Payer: Self-pay | Admitting: Family Medicine

## 2023-05-11 DIAGNOSIS — E039 Hypothyroidism, unspecified: Secondary | ICD-10-CM

## 2023-05-11 NOTE — Telephone Encounter (Signed)
Pt advised of results/instructions. 

## 2023-05-11 NOTE — Telephone Encounter (Signed)
Please call patient thyroid function are normal but there has been a significant change for the short amount of time she has been taking the higher dose. I recommend she repeat thyroid lab by lab appt only in 4 weeks. (Ordered) Kidney function is normal . Blood cell counts are elevated slightly. Could be reflective of recent surgery/healing process. Monitor for any fever, chills or signs of infection and follow up if experiences.

## 2023-05-12 ENCOUNTER — Ambulatory Visit: Payer: PPO | Attending: Cardiovascular Disease

## 2023-05-12 DIAGNOSIS — R001 Bradycardia, unspecified: Secondary | ICD-10-CM

## 2023-05-12 LAB — CUP PACEART INCLINIC DEVICE CHECK
Date Time Interrogation Session: 20240515094546
Implantable Lead Connection Status: 753985
Implantable Lead Connection Status: 753985
Implantable Lead Implant Date: 20240501
Implantable Lead Implant Date: 20240501
Implantable Lead Location: 753859
Implantable Lead Location: 753860
Implantable Pulse Generator Implant Date: 20240501
Lead Channel Setting Pacing Amplitude: 3.5 V
Lead Channel Setting Pacing Amplitude: 3.5 V
Lead Channel Setting Pacing Pulse Width: 0.5 ms
Lead Channel Setting Sensing Sensitivity: 0.5 mV
Pulse Gen Model: 2272
Pulse Gen Serial Number: 8181731

## 2023-05-12 NOTE — Patient Instructions (Signed)

## 2023-05-12 NOTE — Progress Notes (Signed)

## 2023-05-21 ENCOUNTER — Ambulatory Visit: Payer: PPO | Admitting: Pulmonary Disease

## 2023-05-21 ENCOUNTER — Encounter: Payer: Self-pay | Admitting: Pulmonary Disease

## 2023-05-21 VITALS — BP 106/62 | HR 63 | Temp 98.1°F | Ht 62.0 in | Wt 169.0 lb

## 2023-05-21 DIAGNOSIS — J4520 Mild intermittent asthma, uncomplicated: Secondary | ICD-10-CM | POA: Diagnosis not present

## 2023-05-21 DIAGNOSIS — G4733 Obstructive sleep apnea (adult) (pediatric): Secondary | ICD-10-CM

## 2023-05-21 DIAGNOSIS — D869 Sarcoidosis, unspecified: Secondary | ICD-10-CM | POA: Diagnosis not present

## 2023-05-21 NOTE — Patient Instructions (Addendum)
Continue albuterol inhaler as needed, 1-2 puffs every 4-6 hours  Continue CPAP nightly  Follow up in 1 year with HRCT Chest scan and monitoring of CPAP machine  Ok to cancel appointment with Dr. Craige Cotta on 6/7  Call sooner if needed
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
## 2023-05-22 ENCOUNTER — Encounter: Payer: Self-pay | Admitting: Pulmonary Disease

## 2023-05-28 DIAGNOSIS — G4733 Obstructive sleep apnea (adult) (pediatric): Secondary | ICD-10-CM | POA: Diagnosis not present

## 2023-06-01 ENCOUNTER — Other Ambulatory Visit: Payer: Self-pay | Admitting: Family Medicine

## 2023-06-04 ENCOUNTER — Telehealth: Payer: Self-pay

## 2023-06-04 ENCOUNTER — Telehealth: Payer: Self-pay | Admitting: Family Medicine

## 2023-06-04 ENCOUNTER — Ambulatory Visit (HOSPITAL_BASED_OUTPATIENT_CLINIC_OR_DEPARTMENT_OTHER): Payer: PPO | Admitting: Pulmonary Disease

## 2023-06-04 NOTE — Telephone Encounter (Signed)
Transmission reviewed normal device function, no episodes, patient sinus rhythm on presenting, patient Ap 18% of the time and <1% VP. No episodes noted. Patient encouraged to follow up with PCP

## 2023-06-04 NOTE — Telephone Encounter (Signed)
Patient reports that she is very fatigued more than usual. She wanted to know if she could come in for her lab appointment today instead of Tuesday the 11th?  She is concerned about her thyroid levels since the change in medication dosage. Please advise patient she mentioned if she can't come today she will wait until Tuesday.

## 2023-06-04 NOTE — Telephone Encounter (Signed)
The pt states within the past week she has been feeling tired. I let her speak with Lanora Manis, rn.

## 2023-06-04 NOTE — Telephone Encounter (Signed)
Spoke with patient regarding results/recommendations.  

## 2023-06-08 ENCOUNTER — Other Ambulatory Visit (INDEPENDENT_AMBULATORY_CARE_PROVIDER_SITE_OTHER): Payer: PPO

## 2023-06-08 DIAGNOSIS — E039 Hypothyroidism, unspecified: Secondary | ICD-10-CM

## 2023-06-08 LAB — TSH: TSH: 1.92 u[IU]/mL (ref 0.35–5.50)

## 2023-06-08 NOTE — Progress Notes (Signed)
Pt came for repeat lab, tolerated draw well.  

## 2023-06-09 ENCOUNTER — Telehealth: Payer: Self-pay | Admitting: Family Medicine

## 2023-06-09 MED ORDER — LEVOTHYROXINE SODIUM 75 MCG PO TABS: 75.0000 ug | ORAL_TABLET | Freq: Every day | ORAL | 3 refills | Status: DC

## 2023-06-09 NOTE — Telephone Encounter (Signed)
Please inform patient her thyroid levels are normal.  Continue at current dose. No changes to her medication dose.  Continue with levothyroxine 75 mcg daily.  Refills have been called in for her

## 2023-06-09 NOTE — Telephone Encounter (Signed)
Spoke with patient regarding results/recommendations.  

## 2023-07-03 ENCOUNTER — Other Ambulatory Visit: Payer: Self-pay | Admitting: Family Medicine

## 2023-07-29 ENCOUNTER — Ambulatory Visit (INDEPENDENT_AMBULATORY_CARE_PROVIDER_SITE_OTHER): Payer: PPO

## 2023-07-29 DIAGNOSIS — R001 Bradycardia, unspecified: Secondary | ICD-10-CM

## 2023-07-29 LAB — CUP PACEART REMOTE DEVICE CHECK
Battery Remaining Longevity: 94 mo
Battery Remaining Percentage: 95.5 %
Battery Voltage: 3.02 V
Brady Statistic AP VP Percent: 1 %
Brady Statistic AP VS Percent: 20 %
Brady Statistic AS VP Percent: 1 %
Brady Statistic AS VS Percent: 74 %
Brady Statistic RA Percent Paced: 15 %
Brady Statistic RV Percent Paced: 1 %
Date Time Interrogation Session: 20240801040021
Implantable Lead Connection Status: 753985
Implantable Lead Connection Status: 753985
Implantable Lead Implant Date: 20240501
Implantable Lead Implant Date: 20240501
Implantable Lead Location: 753859
Implantable Lead Location: 753860
Implantable Pulse Generator Implant Date: 20240501
Lead Channel Impedance Value: 480 Ohm
Lead Channel Impedance Value: 510 Ohm
Lead Channel Pacing Threshold Amplitude: 1 V
Lead Channel Pacing Threshold Amplitude: 1 V
Lead Channel Pacing Threshold Pulse Width: 0.5 ms
Lead Channel Pacing Threshold Pulse Width: 0.5 ms
Lead Channel Sensing Intrinsic Amplitude: 4 mV
Lead Channel Sensing Intrinsic Amplitude: 4.5 mV
Lead Channel Setting Pacing Amplitude: 3.5 V
Lead Channel Setting Pacing Amplitude: 3.5 V
Lead Channel Setting Pacing Pulse Width: 0.5 ms
Lead Channel Setting Sensing Sensitivity: 0.5 mV
Pulse Gen Model: 2272
Pulse Gen Serial Number: 8181731

## 2023-08-04 ENCOUNTER — Other Ambulatory Visit: Payer: Self-pay | Admitting: Family Medicine

## 2023-08-04 NOTE — Telephone Encounter (Signed)
Patient refill request.  Patient scheduled appt for 8/16 with Dr. Claiborne Billings.  She will be out of meds. Can you fill until appt?

## 2023-08-05 MED ORDER — DICLOFENAC SODIUM 75 MG PO TBEC: 75.0000 mg | DELAYED_RELEASE_TABLET | Freq: Two times a day (BID) | ORAL | 0 refills | Status: DC

## 2023-08-05 NOTE — Addendum Note (Signed)
Addended by: Filomena Jungling on: 08/05/2023 07:52 AM   Modules accepted: Orders

## 2023-08-10 NOTE — Progress Notes (Signed)
Remote pacemaker transmission.   

## 2023-08-13 ENCOUNTER — Ambulatory Visit (INDEPENDENT_AMBULATORY_CARE_PROVIDER_SITE_OTHER): Payer: PPO | Admitting: Family Medicine

## 2023-08-13 ENCOUNTER — Encounter: Payer: Self-pay | Admitting: Family Medicine

## 2023-08-13 VITALS — BP 116/66 | HR 63 | Temp 98.0°F | Wt 169.0 lb

## 2023-08-13 DIAGNOSIS — M2550 Pain in unspecified joint: Secondary | ICD-10-CM

## 2023-08-13 DIAGNOSIS — Z789 Other specified health status: Secondary | ICD-10-CM | POA: Diagnosis not present

## 2023-08-13 DIAGNOSIS — M8589 Other specified disorders of bone density and structure, multiple sites: Secondary | ICD-10-CM

## 2023-08-13 DIAGNOSIS — E038 Other specified hypothyroidism: Secondary | ICD-10-CM | POA: Diagnosis not present

## 2023-08-13 DIAGNOSIS — E063 Autoimmune thyroiditis: Secondary | ICD-10-CM | POA: Diagnosis not present

## 2023-08-13 DIAGNOSIS — Z23 Encounter for immunization: Secondary | ICD-10-CM

## 2023-08-13 DIAGNOSIS — I6789 Other cerebrovascular disease: Secondary | ICD-10-CM | POA: Diagnosis not present

## 2023-08-13 DIAGNOSIS — I700 Atherosclerosis of aorta: Secondary | ICD-10-CM

## 2023-08-13 DIAGNOSIS — E7800 Pure hypercholesterolemia, unspecified: Secondary | ICD-10-CM

## 2023-08-13 MED ORDER — GABAPENTIN 100 MG PO CAPS: ORAL_CAPSULE | ORAL | 1 refills | Status: DC

## 2023-08-13 MED ORDER — DICLOFENAC SODIUM 75 MG PO TBEC: 75.0000 mg | DELAYED_RELEASE_TABLET | Freq: Two times a day (BID) | ORAL | 3 refills | Status: DC

## 2023-08-13 MED ORDER — REPATHA 140 MG/ML ~~LOC~~ SOSY
140.0000 mg | PREFILLED_SYRINGE | SUBCUTANEOUS | 11 refills | Status: DC
Start: 2023-08-13 — End: 2024-01-28

## 2023-08-13 MED ORDER — LEVOTHYROXINE SODIUM 75 MCG PO TABS: 75.0000 ug | ORAL_TABLET | Freq: Every day | ORAL | 3 refills | Status: DC
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
## 2023-08-13 NOTE — Patient Instructions (Addendum)
Changed gabapentin to 100 mg in day and 300 mg at night.  Refills on other medications provided.   No follow-ups on file.        Great to see you today.  I have refilled the medication(s) we provide.   If labs were collected or images ordered, we will inform you of  results once we have received them and reviewed. We will contact you either by echart message, or telephone call.  Please give ample time to the testing facility, and our office to run,  receive and review results. Please do not call inquiring of results, even if you can see them in your chart. We will contact you as soon as we are able. If it has been over 1 week since the test was completed, and you have not yet heard from [place], then please call us.    - echart message- for normal results that have been seen by the patient already.   - telephone call: abnormal results or if patient has not viewed results in their echart.  If a referral to a specialist was entered for you, please call us in 2 weeks if you have not heard from the specialist office to schedule.

## 2023-08-25 NOTE — Addendum Note (Signed)
Addended by: Filomena Jungling on: 08/25/2023 11:50 AM   Modules accepted: Orders

## 2023-08-31 ENCOUNTER — Ambulatory Visit: Payer: PPO | Attending: Cardiology | Admitting: Cardiology

## 2023-08-31 ENCOUNTER — Encounter: Payer: Self-pay | Admitting: Family Medicine

## 2023-08-31 ENCOUNTER — Encounter: Payer: Self-pay | Admitting: Cardiology

## 2023-08-31 VITALS — BP 120/80 | HR 69 | Ht 62.0 in | Wt 168.6 lb

## 2023-08-31 DIAGNOSIS — I10 Essential (primary) hypertension: Secondary | ICD-10-CM

## 2023-08-31 DIAGNOSIS — G4733 Obstructive sleep apnea (adult) (pediatric): Secondary | ICD-10-CM | POA: Diagnosis not present

## 2023-08-31 DIAGNOSIS — R001 Bradycardia, unspecified: Secondary | ICD-10-CM

## 2023-08-31 DIAGNOSIS — I495 Sick sinus syndrome: Secondary | ICD-10-CM

## 2023-08-31 DIAGNOSIS — I2510 Atherosclerotic heart disease of native coronary artery without angina pectoris: Secondary | ICD-10-CM | POA: Diagnosis not present

## 2023-08-31 NOTE — Progress Notes (Signed)
  Electrophysiology Office Note:   Date:  08/31/2023  ID:  Schmitt, Grace [birth year], MRN [id number]  Primary Cardiologist: Garwin Brothers, MD Electrophysiologist: Regan Lemming, MD      History of Present Illness:   Grace Schmitt is a 77 y.o. female with h/o sick sinus syndrome seen today for post hospital follow up.    Admitted sick sinus syndrome and syncope post pacemaker  Since discharge from hospital the patient reports doing well.  She has had no further episodes of syncope.  She has no chest pain or shortness of breath.  She is able to do all her daily activities without restriction.  She has some mild itching at her pacemaker site, but otherwise no acute complaints.  she denies chest pain, palpitations, dyspnea, PND, orthopnea, nausea, vomiting, dizziness, syncope, edema, weight gain, or early satiety.   Review of systems complete and found to be negative unless listed in HPI.      EP Information / Studies Reviewed:    EKG is ordered today. Personal review as below.  EKG Interpretation Date/Time:  Tuesday August 31 2023 15:56:00 EDT Ventricular Rate:  69 PR Interval:  170 QRS Duration:  84 QT Interval:  422 QTC Calculation: 452 R Axis:   -6  Text Interpretation: Sinus rhythm with Premature supraventricular complexes Septal infarct (cited on or before 29-Apr-2023) When compared with ECG of 29-Apr-2023 04:12, No significant change was found Confirmed by Grace Schmitt ([id number]) on 08/31/2023 4:28:33 PM   PPM Interrogation-  reviewed in detail today,  See PACEART report.  Device History: Abbott Dual Chamber PPM implanted 04/28/23 for Sinus Node Dysfunction  Risk Assessment/Calculations:         STOP-Bang Score:          Physical Exam:   VS:  BP 120/80 (BP Location: Right Arm, Patient Position: Sitting, Cuff Size: Large)   Pulse 69   Ht 5\' 2"  (1.575 m)   Wt 168 lb 9.6 oz (76.5 kg)   SpO2 98%   BMI 30.84 kg/m    Wt Readings from Last 3  Encounters:  08/31/23 168 lb 9.6 oz (76.5 kg)  08/13/23 169 lb (76.7 kg)  05/21/23 169 lb (76.7 kg)     GEN: Well nourished, well developed in no acute distress NECK: No JVD; No carotid bruits CARDIAC: Regular rate and rhythm, no murmurs, rubs, gallops RESPIRATORY:  Clear to auscultation without rales, wheezing or rhonchi  ABDOMEN: Soft, non-tender, non-distended EXTREMITIES:  No edema; No deformity   ASSESSMENT AND PLAN:    1.  SND s/p Abbott PPM  Normal PPM function See Pace Art report No changes today  2.  Hypertension: Currently well-controlled  3.  Coronary artery disease: No current chest pain.  Plan per primary cardiology.  4.  Obstructive sleep apnea: CPAP compliance encouraged  Disposition:   Follow up with EP APP in 12 months  Signed, Grace Schmitt Grace Loa, MD

## 2023-08-31 NOTE — Patient Instructions (Signed)
Medication Instructions:  Your physician recommends that you continue on your current medications as directed. Please refer to the Current Medication list given to you today.  *If you need a refill on your cardiac medications before your next appointment, please call your pharmacy*   Lab Work: None ordered If you have labs (blood work) drawn today and your tests are completely normal, you will receive your results only by: MyChart Message (if you have MyChart) OR A paper copy in the mail If you have any lab test that is abnormal or we need to change your treatment, we will call you to review the results.   Testing/Procedures: None ordered   Follow-Up: At [facility], you and your health needs are our priority.  As part of our continuing mission to provide you with exceptional heart care, we have created designated Provider Care Teams.  These Care Teams include your primary Cardiologist (physician) and Advanced Practice Providers (APPs -  Physician Assistants and Nurse Practitioners) who all work together to provide you with the care you need, when you need it.  Remote monitoring is used to monitor your Pacemaker or ICD from home. This monitoring reduces the number of office visits required to check your device to one time per year. It allows [place] to keep an eye on the functioning of your device to ensure it is working properly. You are scheduled for a device check from home on 10/28/2023. You may send your transmission at any time that day. If you have a wireless device, the transmission will be sent automatically. After your physician reviews your transmission, you will receive a postcard with your next transmission date.  Your next appointment:   1 year(s)  The format for your next appointment:   In Person  Provider:   You will see one of the following Advanced Practice Providers on your designated Care Team:   Francis Dowse, [place] Baldwin Crown" Dr[place], [place] Canary Brim,  NP  {  Thank you for choosing [facility]!!   Dory Horn, RN [phone]    Other Instructions

## 2023-09-02 DIAGNOSIS — N958 Other specified menopausal and perimenopausal disorders: Secondary | ICD-10-CM | POA: Diagnosis not present

## 2023-09-02 DIAGNOSIS — M8588 Other specified disorders of bone density and structure, other site: Secondary | ICD-10-CM | POA: Diagnosis not present

## 2023-09-02 DIAGNOSIS — Z1231 Encounter for screening mammogram for malignant neoplasm of breast: Secondary | ICD-10-CM | POA: Diagnosis not present

## 2023-09-03 ENCOUNTER — Encounter: Payer: Self-pay | Admitting: Neurology

## 2023-09-03 ENCOUNTER — Ambulatory Visit: Payer: PPO | Admitting: Neurology

## 2023-09-03 VITALS — BP 134/65 | HR 74 | Ht 62.0 in | Wt 168.8 lb

## 2023-09-03 DIAGNOSIS — R404 Transient alteration of awareness: Secondary | ICD-10-CM

## 2023-09-03 DIAGNOSIS — R569 Unspecified convulsions: Secondary | ICD-10-CM | POA: Diagnosis not present
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
## 2023-09-08 ENCOUNTER — Other Ambulatory Visit (INDEPENDENT_AMBULATORY_CARE_PROVIDER_SITE_OTHER): Payer: PPO

## 2023-09-08 DIAGNOSIS — R569 Unspecified convulsions: Secondary | ICD-10-CM

## 2023-09-08 DIAGNOSIS — R404 Transient alteration of awareness: Secondary | ICD-10-CM | POA: Diagnosis not present

## 2023-09-11 LAB — LAMOTRIGINE LEVEL: Lamotrigine Lvl: 3.2 ug/mL (ref 2.5–15.0)

## 2023-09-13 ENCOUNTER — Telehealth: Payer: Self-pay

## 2023-09-13 ENCOUNTER — Encounter: Payer: Self-pay | Admitting: Neurology

## 2023-09-13 NOTE — Telephone Encounter (Signed)
I spoke with the patient and scheduled her for 09/15/2023 at 12 pm.

## 2023-09-13 NOTE — Addendum Note (Signed)
Addended by: Van Clines on: 09/13/2023 09:24 AM   Modules accepted: Orders

## 2023-09-13 NOTE — Telephone Encounter (Signed)
LMTCB. Pt needs a short device clinic apt to reprogram to chronic settings.

## 2023-09-15 ENCOUNTER — Ambulatory Visit: Payer: PPO | Attending: Interventional Cardiology

## 2023-09-15 ENCOUNTER — Telehealth: Payer: Self-pay

## 2023-09-15 DIAGNOSIS — I495 Sick sinus syndrome: Secondary | ICD-10-CM

## 2023-09-15 LAB — CUP PACEART INCLINIC DEVICE CHECK
Battery Remaining Longevity: 134 mo
Battery Voltage: 3.02 V
Brady Statistic RA Percent Paced: 18 %
Brady Statistic RV Percent Paced: 0.09 %
Date Time Interrogation Session: 20240918203700
Implantable Lead Connection Status: 753985
Implantable Lead Connection Status: 753985
Implantable Lead Implant Date: 20240501
Implantable Lead Implant Date: 20240501
Implantable Lead Location: 753859
Implantable Lead Location: 753860
Implantable Pulse Generator Implant Date: 20240501
Lead Channel Impedance Value: 475 Ohm
Lead Channel Impedance Value: 550 Ohm
Lead Channel Pacing Threshold Amplitude: 1 V
Lead Channel Pacing Threshold Amplitude: 1 V
Lead Channel Pacing Threshold Amplitude: 1 V
Lead Channel Pacing Threshold Amplitude: 1 V
Lead Channel Pacing Threshold Pulse Width: 0.5 ms
Lead Channel Pacing Threshold Pulse Width: 0.5 ms
Lead Channel Pacing Threshold Pulse Width: 0.5 ms
Lead Channel Pacing Threshold Pulse Width: 0.5 ms
Lead Channel Sensing Intrinsic Amplitude: 5 mV
Lead Channel Sensing Intrinsic Amplitude: 5.8 mV
Lead Channel Setting Pacing Amplitude: 2.5 V
Lead Channel Setting Pacing Amplitude: 2.5 V
Lead Channel Setting Pacing Pulse Width: 0.5 ms
Lead Channel Setting Sensing Sensitivity: 0.5 mV
Pulse Gen Model: 2272
Pulse Gen Serial Number: 8181731

## 2023-09-15 NOTE — Progress Notes (Signed)
Patient brought in today to review and adjust programmed thresholds for chronic phase to improve battery.  RA/RV programmed 2.5 @0 .5 with Monitor on for RA and OFF for the RV. RA threshold test: 1.0 @0 .5 ms RV threshold test: 1.0@ 0.18ms    Normal device function.  Interrogation does reveal frequent PVC's with overall burden of 5.2%.  Since implant, PVC burden has consistently been 5.2-6.7%.  Patient does report fatigue as main symptom.  She is not on a beta blocker. Will send to Dr. Elberta Fortis to review.  Noted: histograms also fairly blunted, may benefit with turning on rate response.  See phone encounter detailing review for Dr. Elberta Fortis and order request if appropriate.

## 2023-09-15 NOTE — Telephone Encounter (Signed)
Patient brought in to device clinic today for 91 day chronic lead threshold adjustment that was missed at in office appointment.   Interrogation revealed frequent PVC's on presenting with a noted PVC burden of 5.2%.  Since implant (04/2023), PVC burden has consistently remained between 5.2% and 6.7%.  Patient reports fatigue as a main complaint. She denies any feelings of palpitations.  She is not on any beta blocker / rate control medication.  Reports on average her BP's range 110-130's/ 70's.    Forwarding to Dr. Elberta Fortis to flag PVC burden and if any changes should be made to meds or programming.  She is programmed DDD 60/120.  (Ap 18%/ VP <1%.)  Also wonder if we should consider turning on rate response as patient has dx of Sick Sinus Syndrome.  HG's appear fairly blunted.

## 2023-09-16 NOTE — Telephone Encounter (Signed)
Reviewed with Dr. Elberta Fortis.  At this time, burden is likely not indicative of her ongoing, frequent fatigue.  She should see her PCP regarding that issue.  We will continue to monitor.  If burden increases or other noted concern, can revisit whether next steps are needed.   Notified patient.  She does want to mention that she has hx of partial seizures and wonders if that correlates with having PVC's.  I do not know of any correlation but did inform patient I would add that to her record. She will follow up with her PCP as needed.   Discussed seizure/PVC connection with Dr. Elberta Fortis.  No concerns other than PVC's sometimes can be side effects of meds used to treat seizures.

## 2023-09-23 ENCOUNTER — Ambulatory Visit: Payer: PPO | Admitting: Family

## 2023-09-23 VITALS — BP 149/78 | HR 64 | Temp 98.0°F | Ht 62.0 in | Wt 167.2 lb

## 2023-09-23 DIAGNOSIS — S50861A Insect bite (nonvenomous) of right forearm, initial encounter: Secondary | ICD-10-CM

## 2023-09-23 DIAGNOSIS — W57XXXA Bitten or stung by nonvenomous insect and other nonvenomous arthropods, initial encounter: Secondary | ICD-10-CM

## 2023-09-23 DIAGNOSIS — R21 Rash and other nonspecific skin eruption: Secondary | ICD-10-CM

## 2023-09-23 MED ORDER — TRIAMCINOLONE ACETONIDE 0.1 % EX CREA
1.0000 | TOPICAL_CREAM | Freq: Two times a day (BID) | CUTANEOUS | 0 refills | Status: DC
Start: 2023-09-23 — End: 2024-02-11

## 2023-09-23 MED ORDER — DOXYCYCLINE HYCLATE 100 MG PO TABS
100.0000 mg | ORAL_TABLET | Freq: Two times a day (BID) | ORAL | 0 refills | Status: AC
Start: 2023-09-23 — End: 2023-09-30
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
## 2023-10-19 DIAGNOSIS — H35363 Drusen (degenerative) of macula, bilateral: Secondary | ICD-10-CM | POA: Diagnosis not present

## 2023-10-28 ENCOUNTER — Ambulatory Visit (INDEPENDENT_AMBULATORY_CARE_PROVIDER_SITE_OTHER): Payer: PPO

## 2023-10-28 DIAGNOSIS — I495 Sick sinus syndrome: Secondary | ICD-10-CM | POA: Diagnosis not present

## 2023-10-28 LAB — CUP PACEART REMOTE DEVICE CHECK
Battery Remaining Longevity: 116 mo
Battery Remaining Percentage: 95.5 %
Battery Voltage: 3.01 V
Brady Statistic AP VP Percent: 1 %
Brady Statistic AP VS Percent: 20 %
Brady Statistic AS VP Percent: 1 %
Brady Statistic AS VS Percent: 75 %
Brady Statistic RA Percent Paced: 14 %
Brady Statistic RV Percent Paced: 1 %
Date Time Interrogation Session: 20241031040014
Implantable Lead Connection Status: 753985
Implantable Lead Connection Status: 753985
Implantable Lead Implant Date: 20240501
Implantable Lead Implant Date: 20240501
Implantable Lead Location: 753859
Implantable Lead Location: 753860
Implantable Pulse Generator Implant Date: 20240501
Lead Channel Impedance Value: 440 Ohm
Lead Channel Impedance Value: 540 Ohm
Lead Channel Pacing Threshold Amplitude: 1 V
Lead Channel Pacing Threshold Amplitude: 1 V
Lead Channel Pacing Threshold Pulse Width: 0.5 ms
Lead Channel Pacing Threshold Pulse Width: 0.5 ms
Lead Channel Sensing Intrinsic Amplitude: 3.9 mV
Lead Channel Sensing Intrinsic Amplitude: 4 mV
Lead Channel Setting Pacing Amplitude: 2.5 V
Lead Channel Setting Pacing Amplitude: 2.5 V
Lead Channel Setting Pacing Pulse Width: 0.5 ms
Lead Channel Setting Sensing Sensitivity: 0.5 mV
Pulse Gen Model: 2272
Pulse Gen Serial Number: 8181731

## 2023-11-06 ENCOUNTER — Encounter: Payer: Self-pay | Admitting: Neurology

## 2023-11-10 NOTE — Progress Notes (Signed)
Remote pacemaker transmission.   

## 2023-11-13 ENCOUNTER — Encounter: Payer: Self-pay | Admitting: Neurology

## 2023-11-16 ENCOUNTER — Other Ambulatory Visit: Payer: Self-pay

## 2023-11-16 MED ORDER — LAMOTRIGINE 100 MG PO TABS: 300.0000 mg | ORAL_TABLET | Freq: Two times a day (BID) | ORAL | 1 refills | Status: DC

## 2023-11-18 ENCOUNTER — Ambulatory Visit: Payer: PPO | Admitting: Cardiology

## 2023-11-18 DIAGNOSIS — R569 Unspecified convulsions: Secondary | ICD-10-CM | POA: Diagnosis not present

## 2023-11-18 DIAGNOSIS — G2581 Restless legs syndrome: Secondary | ICD-10-CM | POA: Diagnosis not present

## 2023-11-24 ENCOUNTER — Ambulatory Visit: Payer: PPO | Attending: Cardiology | Admitting: Cardiology

## 2024-01-03 ENCOUNTER — Ambulatory Visit: Payer: PPO | Admitting: Neurology

## 2024-01-03 VITALS — BP 146/75 | HR 64 | Ht 62.0 in | Wt 167.0 lb

## 2024-01-03 DIAGNOSIS — R569 Unspecified convulsions: Secondary | ICD-10-CM

## 2024-01-03 DIAGNOSIS — R404 Transient alteration of awareness: Secondary | ICD-10-CM

## 2024-01-03 MED ORDER — LAMOTRIGINE 100 MG PO TABS: 300.0000 mg | ORAL_TABLET | Freq: Two times a day (BID) | ORAL | 3 refills | Status: DC
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
## 2024-01-03 NOTE — Patient Instructions (Signed)
 Good to see you. Continue Lamotrigine  100mg : take 3 tablets twice a day. Follow-up in 4 months, call for any changes.    Seizure Precautions: 1. If medication has been prescribed for you to prevent seizures, take it exactly as directed.  Do not stop taking the medicine without talking to your doctor first, even if you have not had a seizure in a long time.   2. Avoid activities in which a seizure would cause danger to yourself or to others.  Don't operate dangerous machinery, swim alone, or climb in high or dangerous places, such as on ladders, roofs, or girders.  Do not drive unless your doctor says you may.  3. If you have any warning that you may have a seizure, lay down in a safe place where you can't hurt yourself.    4.  No driving for 6 months from last seizure, as per [place]  state law.   Please refer to the following link on the [employer]ile[employer] of [employer]'s website for more information: http://www.epilepsyfoundation.org/answerplace/Social/driving/drivingu.cfm   5.  Maintain good sleep hygiene. Avoid alcohol  6.  Contact your doctor if you have any problems that may be related to the medicine you are taking.  7.  Call 911 and bring the patient back to the ED if:        A.  The seizure lasts longer than 5 minutes.       B.  The patient doesn't awaken shortly after the seizure  C.  The patient has new problems such as difficulty seeing, speaking or moving  D.  The patient was injured during the seizure  E.  The patient has a temperature over 102 F (39C)  F.  The patient vomited and now is having trouble breathing

## 2024-01-05 ENCOUNTER — Ambulatory Visit: Payer: PPO | Admitting: *Deleted

## 2024-01-05 DIAGNOSIS — Z0000 Encounter for general adult medical examination without abnormal findings: Secondary | ICD-10-CM | POA: Diagnosis not present
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
## 2024-01-18 ENCOUNTER — Encounter: Payer: Self-pay | Admitting: Family Medicine

## 2024-01-18 ENCOUNTER — Telehealth: Payer: Self-pay

## 2024-01-18 NOTE — Telephone Encounter (Signed)
Pt needing PA on rx Repatha .

## 2024-01-19 MED ORDER — LAMOTRIGINE 100 MG PO TABS: ORAL_TABLET | ORAL | 3 refills | Status: DC

## 2024-01-21 ENCOUNTER — Other Ambulatory Visit (HOSPITAL_COMMUNITY): Payer: Self-pay

## 2024-01-27 ENCOUNTER — Ambulatory Visit: Payer: PPO

## 2024-01-27 DIAGNOSIS — I495 Sick sinus syndrome: Secondary | ICD-10-CM

## 2024-01-28 ENCOUNTER — Encounter: Payer: Self-pay | Admitting: Family Medicine

## 2024-01-28 ENCOUNTER — Ambulatory Visit (INDEPENDENT_AMBULATORY_CARE_PROVIDER_SITE_OTHER): Payer: PPO | Admitting: Family Medicine

## 2024-01-28 VITALS — BP 122/64 | HR 69 | Temp 97.9°F | Wt 167.0 lb

## 2024-01-28 DIAGNOSIS — R5383 Other fatigue: Secondary | ICD-10-CM

## 2024-01-28 DIAGNOSIS — E559 Vitamin D deficiency, unspecified: Secondary | ICD-10-CM | POA: Diagnosis not present

## 2024-01-28 DIAGNOSIS — M8589 Other specified disorders of bone density and structure, multiple sites: Secondary | ICD-10-CM | POA: Diagnosis not present

## 2024-01-28 DIAGNOSIS — M797 Fibromyalgia: Secondary | ICD-10-CM | POA: Diagnosis not present

## 2024-01-28 DIAGNOSIS — G2581 Restless legs syndrome: Secondary | ICD-10-CM | POA: Diagnosis not present

## 2024-01-28 DIAGNOSIS — I6789 Other cerebrovascular disease: Secondary | ICD-10-CM

## 2024-01-28 DIAGNOSIS — E7800 Pure hypercholesterolemia, unspecified: Secondary | ICD-10-CM

## 2024-01-28 DIAGNOSIS — G40909 Epilepsy, unspecified, not intractable, without status epilepticus: Secondary | ICD-10-CM | POA: Diagnosis not present

## 2024-01-28 DIAGNOSIS — Z789 Other specified health status: Secondary | ICD-10-CM | POA: Diagnosis not present

## 2024-01-28 LAB — BASIC METABOLIC PANEL
BUN: 31 mg/dL — ABNORMAL HIGH (ref 6–23)
CO2: 33 meq/L — ABNORMAL HIGH (ref 19–32)
Calcium: 9.9 mg/dL (ref 8.4–10.5)
Chloride: 99 meq/L (ref 96–112)
Creatinine, Ser: 0.86 mg/dL (ref 0.40–1.20)
GFR: 64.57 mL/min (ref >60.00)
Glucose, Bld: 81 mg/dL (ref 70–99)
Potassium: 4.9 meq/L (ref 3.5–5.1)
Sodium: 139 meq/L (ref 135–145)

## 2024-01-28 LAB — CBC WITH DIFFERENTIAL/PLATELET
Basophils Absolute: 0.1 10*3/uL (ref 0.0–0.1)
Basophils Relative: 0.9 % (ref 0.0–3.0)
Eosinophils Absolute: 0.5 10*3/uL (ref 0.0–0.7)
Eosinophils Relative: 5.3 % — ABNORMAL HIGH (ref 0.0–5.0)
HCT: 42.4 % (ref 36.0–46.0)
Hemoglobin: 13.9 g/dL (ref 12.0–15.0)
Lymphocytes Relative: 22 % (ref 12.0–46.0)
Lymphs Abs: 2 10*3/uL (ref 0.7–4.0)
MCHC: 32.8 g/dL (ref 30.0–36.0)
MCV: 97.1 fL (ref 78.0–100.0)
Monocytes Absolute: 0.7 10*3/uL (ref 0.1–1.0)
Monocytes Relative: 8.2 % (ref 3.0–12.0)
Neutro Abs: 5.8 10*3/uL (ref 1.4–7.7)
Neutrophils Relative %: 63.6 % (ref 43.0–77.0)
Platelets: 290 10*3/uL (ref 150.0–400.0)
RBC: 4.36 Mil/uL (ref 3.87–5.11)
RDW: 13 % (ref 11.5–15.5)
WBC: 9.1 10*3/uL (ref 4.0–10.5)

## 2024-01-28 LAB — CUP PACEART REMOTE DEVICE CHECK
Battery Remaining Longevity: 115 mo
Battery Remaining Percentage: 95.5 %
Battery Voltage: 3.01 V
Brady Statistic AP VP Percent: 1 %
Brady Statistic AP VS Percent: 15 %
Brady Statistic AS VP Percent: 1 %
Brady Statistic AS VS Percent: 79 %
Brady Statistic RA Percent Paced: 10 %
Brady Statistic RV Percent Paced: 1 %
Date Time Interrogation Session: 20250130213730
Implantable Lead Connection Status: 753985
Implantable Lead Connection Status: 753985
Implantable Lead Implant Date: 20240501
Implantable Lead Implant Date: 20240501
Implantable Lead Location: 753859
Implantable Lead Location: 753860
Implantable Pulse Generator Implant Date: 20240501
Lead Channel Impedance Value: 440 Ohm
Lead Channel Impedance Value: 560 Ohm
Lead Channel Pacing Threshold Amplitude: 0.75 V
Lead Channel Pacing Threshold Amplitude: 1 V
Lead Channel Pacing Threshold Pulse Width: 0.5 ms
Lead Channel Pacing Threshold Pulse Width: 0.5 ms
Lead Channel Sensing Intrinsic Amplitude: 4.6 mV
Lead Channel Sensing Intrinsic Amplitude: 4.6 mV
Lead Channel Setting Pacing Amplitude: 2.5 V
Lead Channel Setting Pacing Amplitude: 2.5 V
Lead Channel Setting Pacing Pulse Width: 0.5 ms
Lead Channel Setting Sensing Sensitivity: 0.5 mV
Pulse Gen Model: 2272
Pulse Gen Serial Number: 8181731

## 2024-01-28 LAB — IBC + FERRITIN
Ferritin: 136 ng/mL (ref 10.0–291.0)
Iron: 110 ug/dL (ref 42–145)
Saturation Ratios: 39.5 % (ref 20.0–50.0)
TIBC: 278.6 ug/dL (ref 250.0–450.0)
Transferrin: 199 mg/dL — ABNORMAL LOW (ref 212.0–360.0)

## 2024-01-28 LAB — HEMOGLOBIN A1C: Hgb A1c MFr Bld: 5.9 % (ref 4.6–6.5)

## 2024-01-28 LAB — LIPID PANEL
Cholesterol: 218 mg/dL — ABNORMAL HIGH (ref 0–200)
HDL: 74.4 mg/dL (ref >39.00)
LDL Cholesterol: 122 mg/dL — ABNORMAL HIGH (ref 0–99)
NonHDL: 143.49
Total CHOL/HDL Ratio: 3
Triglycerides: 109 mg/dL (ref 0.0–149.0)
VLDL: 21.8 mg/dL (ref 0.0–40.0)

## 2024-01-28 LAB — TSH: TSH: 1.71 u[IU]/mL (ref 0.35–5.50)

## 2024-01-28 LAB — VITAMIN D 25 HYDROXY (VIT D DEFICIENCY, FRACTURES): VITD: 34.86 ng/mL (ref 30.00–100.00)

## 2024-01-28 MED ORDER — DICLOFENAC SODIUM 75 MG PO TBEC: 75.0000 mg | DELAYED_RELEASE_TABLET | Freq: Two times a day (BID) | ORAL | 3 refills | Status: DC

## 2024-01-28 MED ORDER — REPATHA 140 MG/ML ~~LOC~~ SOSY: 140.0000 mg | PREFILLED_SYRINGE | SUBCUTANEOUS | 11 refills | Status: DC
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
## 2024-01-28 NOTE — Patient Instructions (Addendum)

## 2024-01-31 ENCOUNTER — Encounter: Payer: Self-pay | Admitting: Family Medicine

## 2024-02-11 ENCOUNTER — Encounter: Payer: Self-pay | Admitting: Cardiology

## 2024-02-11 ENCOUNTER — Ambulatory Visit: Payer: PPO | Attending: Cardiology | Admitting: Cardiology

## 2024-02-11 VITALS — BP 128/68 | HR 74 | Ht 62.0 in | Wt 167.1 lb

## 2024-02-11 DIAGNOSIS — I2510 Atherosclerotic heart disease of native coronary artery without angina pectoris: Secondary | ICD-10-CM | POA: Diagnosis not present

## 2024-02-11 DIAGNOSIS — E782 Mixed hyperlipidemia: Secondary | ICD-10-CM

## 2024-02-11 DIAGNOSIS — Z789 Other specified health status: Secondary | ICD-10-CM | POA: Diagnosis not present

## 2024-02-11 DIAGNOSIS — Z950 Presence of cardiac pacemaker: Secondary | ICD-10-CM

## 2024-02-11 DIAGNOSIS — G4733 Obstructive sleep apnea (adult) (pediatric): Secondary | ICD-10-CM

## 2024-02-11 DIAGNOSIS — I700 Atherosclerosis of aorta: Secondary | ICD-10-CM | POA: Diagnosis not present
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
## 2024-02-11 NOTE — Patient Instructions (Signed)

## 2024-03-03 NOTE — Addendum Note (Signed)
 Addended by: Elease Etienne A on: 03/03/2024 02:40 PM   Modules accepted: Orders

## 2024-03-03 NOTE — Progress Notes (Signed)
 Remote pacemaker transmission.

## 2024-04-27 ENCOUNTER — Ambulatory Visit (INDEPENDENT_AMBULATORY_CARE_PROVIDER_SITE_OTHER): Payer: PPO

## 2024-04-27 DIAGNOSIS — I495 Sick sinus syndrome: Secondary | ICD-10-CM | POA: Diagnosis not present

## 2024-04-27 LAB — CUP PACEART REMOTE DEVICE CHECK
Battery Remaining Longevity: 112 mo
Battery Remaining Percentage: 95 %
Battery Voltage: 3.01 V
Brady Statistic AP VP Percent: 1 %
Brady Statistic AP VS Percent: 16 %
Brady Statistic AS VP Percent: 1 %
Brady Statistic AS VS Percent: 78 %
Brady Statistic RA Percent Paced: 10 %
Brady Statistic RV Percent Paced: 1 %
Date Time Interrogation Session: 20250501040024
Implantable Lead Connection Status: 753985
Implantable Lead Connection Status: 753985
Implantable Lead Implant Date: 20240501
Implantable Lead Implant Date: 20240501
Implantable Lead Location: 753859
Implantable Lead Location: 753860
Implantable Pulse Generator Implant Date: 20240501
Lead Channel Impedance Value: 430 Ohm
Lead Channel Impedance Value: 550 Ohm
Lead Channel Pacing Threshold Amplitude: 0.875 V
Lead Channel Pacing Threshold Amplitude: 1 V
Lead Channel Pacing Threshold Pulse Width: 0.5 ms
Lead Channel Pacing Threshold Pulse Width: 0.5 ms
Lead Channel Sensing Intrinsic Amplitude: 4.6 mV
Lead Channel Sensing Intrinsic Amplitude: 5.1 mV
Lead Channel Setting Pacing Amplitude: 2.5 V
Lead Channel Setting Pacing Amplitude: 2.5 V
Lead Channel Setting Pacing Pulse Width: 0.5 ms
Lead Channel Setting Sensing Sensitivity: 0.5 mV
Pulse Gen Model: 2272
Pulse Gen Serial Number: 8181731

## 2024-05-05 DIAGNOSIS — G4733 Obstructive sleep apnea (adult) (pediatric): Secondary | ICD-10-CM | POA: Diagnosis not present

## 2024-05-08 ENCOUNTER — Ambulatory Visit (HOSPITAL_COMMUNITY)
Admission: RE | Admit: 2024-05-08 | Discharge: 2024-05-08 | Disposition: A | Discharge status: Home / Self Care | Source: Ambulatory Visit | Attending: Pulmonary Disease | Admitting: Pulmonary Disease

## 2024-05-08 DIAGNOSIS — R918 Other nonspecific abnormal finding of lung field: Secondary | ICD-10-CM | POA: Diagnosis not present

## 2024-05-08 DIAGNOSIS — J984 Other disorders of lung: Secondary | ICD-10-CM | POA: Diagnosis not present

## 2024-05-08 DIAGNOSIS — J849 Interstitial pulmonary disease, unspecified: Secondary | ICD-10-CM | POA: Diagnosis not present

## 2024-05-08 DIAGNOSIS — D869 Sarcoidosis, unspecified: Secondary | ICD-10-CM | POA: Insufficient documentation

## 2024-05-11 ENCOUNTER — Ambulatory Visit: Payer: PPO | Admitting: Neurology

## 2024-05-11 ENCOUNTER — Encounter: Payer: Self-pay | Admitting: Neurology

## 2024-05-11 VITALS — BP 138/68 | HR 66 | Ht 62.0 in | Wt 167.8 lb

## 2024-05-11 DIAGNOSIS — R404 Transient alteration of awareness: Secondary | ICD-10-CM | POA: Diagnosis not present

## 2024-05-11 DIAGNOSIS — R569 Unspecified convulsions: Secondary | ICD-10-CM | POA: Diagnosis not present

## 2024-05-11 MED ORDER — CLOBAZAM 10 MG PO TABS: ORAL_TABLET | ORAL | 5 refills | Status: DC
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
## 2024-05-11 NOTE — Patient Instructions (Addendum)
 Always good to see you.  Schedule another EMU admission  2. Let's try adding low dose Clobazam 10mg : take 1/2 tablet every evening. Continue all your medications  3. Follow-up in 4 months, call for any changes   Seizure Precautions: 1. If medication has been prescribed for you to prevent seizures, take it exactly as directed.  Do not stop taking the medicine without talking to your doctor first, even if you have not had a seizure in a long time.   2. Avoid activities in which a seizure would cause danger to yourself or to others.  Don't operate dangerous machinery, swim alone, or climb in high or dangerous places, such as on ladders, roofs, or girders.  Do not drive unless your doctor says you may.  3. If you have any warning that you may have a seizure, lay down in a safe place where you can't hurt yourself.    4.  No driving for 6 months from last seizure, as per [place]  state law.   Please refer to the following link on the [employer]ile[employer] of [employer]'s website for more information: http://www.epilepsyfoundation.org/answerplace/Social/driving/drivingu.cfm   5.  Maintain good sleep hygiene.   6.  Contact your doctor if you have any problems that may be related to the medicine you are taking.  7.  Call 911 and bring the patient back to the ED if:        A.  The seizure lasts longer than 5 minutes.       B.  The patient doesn't awaken shortly after the seizure  C.  The patient has new problems such as difficulty seeing, speaking or moving  D.  The patient was injured during the seizure  E.  The patient has a temperature over 102 F (39C)  F.  The patient vomited and now is having trouble breathing

## 2024-05-26 ENCOUNTER — Encounter: Payer: Self-pay | Admitting: Neurology

## 2024-05-29 ENCOUNTER — Other Ambulatory Visit: Payer: Self-pay

## 2024-05-29 DIAGNOSIS — R569 Unspecified convulsions: Secondary | ICD-10-CM

## 2024-05-29 DIAGNOSIS — R404 Transient alteration of awareness: Secondary | ICD-10-CM

## 2024-06-06 ENCOUNTER — Telehealth: Payer: Self-pay

## 2024-06-06 DIAGNOSIS — R569 Unspecified convulsions: Secondary | ICD-10-CM

## 2024-06-06 MED ORDER — CLOBAZAM 10 MG PO TABS: ORAL_TABLET | ORAL | 5 refills | Status: DC

## 2024-06-06 NOTE — Telephone Encounter (Signed)
 Pls see how she feels about increasing the Clobazam  10mg : take 1 tablet every evening, monitor for drowsiness. Thanks

## 2024-06-06 NOTE — Telephone Encounter (Signed)
 Pt called no answer left a voice mail to call the office back

## 2024-06-06 NOTE — Telephone Encounter (Signed)
 Pt called back.

## 2024-06-06 NOTE — Telephone Encounter (Signed)
 Rx sent, thanks

## 2024-06-06 NOTE — Telephone Encounter (Signed)
 Pt would like try increasing the Clobazam  10mg : take 1 tablet every evening,  She just had another seizure

## 2024-06-06 NOTE — Telephone Encounter (Signed)
 Pt c/o: seizure Missed medications?  No. Sleep deprived?  Yes.   Sat night had bad night with restless leg did take a dose of tylenol  to see if that would help with a heat pad, and Monday morning seizures started Alcohol intake?  No. Increased stress? No. Any change in medication color or shape? No. Any trigger? No  Back to their usual baseline self?  Yes.  . If no, advise go to ER Current medications prescribed by Dr. Ty Gales:   lamoTRIgine  (LAMICTAL ) 100 MG tablet Take 3 and 1/2 tablets twice a day,  cloBAZam  (ONFI ) 10 MG tablet Take 1/2 tablet every evening  gabapentin  (NEURONTIN ) 100 MG capsule 100 mg in the morning and 100 mg qhs   Pt had 6 seizures last night until this morning, she is doing good today last seizure was this morning at 1 am

## 2024-06-08 NOTE — Progress Notes (Signed)
 Remote pacemaker transmission.

## 2024-06-12 ENCOUNTER — Ambulatory Visit: Admitting: Pulmonary Disease

## 2024-06-12 ENCOUNTER — Encounter: Payer: Self-pay | Admitting: Pulmonary Disease

## 2024-06-12 VITALS — BP 137/74 | HR 70 | Ht 62.0 in | Wt 170.0 lb

## 2024-06-12 DIAGNOSIS — J4520 Mild intermittent asthma, uncomplicated: Secondary | ICD-10-CM

## 2024-06-12 DIAGNOSIS — D869 Sarcoidosis, unspecified: Secondary | ICD-10-CM | POA: Diagnosis not present

## 2024-06-12 DIAGNOSIS — G4733 Obstructive sleep apnea (adult) (pediatric): Secondary | ICD-10-CM
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
## 2024-06-12 NOTE — Patient Instructions (Addendum)
 Continue CPAP nightly  Your CT Chest scan is stable, no progressive inflammation or changes  Continue albuterol  inhaler as needed  We will check breathing tests at next visit  Follow up in 6 months with PFTs

## 2024-06-18 ENCOUNTER — Encounter: Payer: Self-pay | Admitting: Pulmonary Disease

## 2024-07-10 ENCOUNTER — Ambulatory Visit: Payer: Self-pay

## 2024-07-10 ENCOUNTER — Ambulatory Visit
Admission: RE | Admit: 2024-07-10 | Discharge: 2024-07-10 | Disposition: A | Discharge status: Home / Self Care | Source: Ambulatory Visit | Attending: Family Medicine | Admitting: Family Medicine

## 2024-07-10 VITALS — BP 161/75 | HR 69 | Temp 98.2°F | Resp 16

## 2024-07-10 DIAGNOSIS — L249 Irritant contact dermatitis, unspecified cause: Secondary | ICD-10-CM

## 2024-07-10 DIAGNOSIS — T63441A Toxic effect of venom of bees, accidental (unintentional), initial encounter: Secondary | ICD-10-CM | POA: Diagnosis not present

## 2024-07-10 MED ORDER — PREDNISONE 10 MG PO TABS
30.0000 mg | ORAL_TABLET | Freq: Every day | ORAL | 0 refills | Status: DC
Start: 2024-07-10 — End: 2024-07-14

## 2024-07-10 NOTE — ED Triage Notes (Signed)
 Pt reports, stung by a bee in the right arm 3 days ago. States she had several breathing reaction. Went to  [facility], they called EMS, breathing treatment gives and improved breathing. States swelling, itching in the right arm x 3 days nausea started today. Pt denies any trouble breathing, talking, swallowing at this moment.

## 2024-07-10 NOTE — Telephone Encounter (Signed)
 No further action needed. Pt scheduled for 7/18
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
## 2024-07-10 NOTE — Telephone Encounter (Signed)
 FYI Only or Action Required?: Action required by provider: request for appointment.  Patient was last seen in primary care on 01/28/2024 by Catherine Fuller A, DO.  Called Nurse Triage reporting Insect Bite.  Symptoms began several days ago.  Interventions attempted: OTC medications: OTC itch cream. Tx with epipen  on 07/07/24 after sting d/t anaphylaxis.  Symptoms are: gradually improving.  Triage Disposition: See Physician Within 24 Hours  Patient/caregiver understands and will follow disposition?: No  Copied from CRM [id number]. Topic: Clinical - Red Word Triage >> Jul 10, 2024 10:07 AM Grace Schmitt wrote: Red Word that prompted transfer to Nurse Triage: Arm red, itchy, swollen.. Feeling sick (unsure if from bee sting) Reason for Disposition  [1] Red streak or red line AND [2] length > 2 inches (5 cm)    Nursing judgement recommended w/I 4 hours d/t concern of infection  Answer Assessment - Initial Assessment Questions Patient advised that she should be evaluated today d/t severity of edema. No available appts with PCP office. Refused UC or ED. Strongly educated pt that she should be seen today d/t concern of potential infection. Advised patient that office will be notified in case there is any availability to be seen today.  1. TYPE: What type of sting was it? (e.g., bee, yellow jacket, unknown)      Bee 2. ONSET: When did it occur?      07/07/24 3. LOCATION: Where is the sting located?  How many stings?     Back of upper right arm 4. SWELLING SIZE: How big is the swelling? (e.g., inches or cm)     Entire arm, ends at wrist 5. REDNESS: Is the area red or pink? If Yes, ask: What size is area of redness? (e.g., inches or cm). When did the redness start?     Entire arm, ends at wrist 6. PAIN: Is there any pain? If Yes, ask: How bad is it?  (Scale 0-10; or none, mild, moderate, severe)     Tender to the touch 7. ITCHING: Is there any itching? If Yes, ask: How bad is  it?      Yes, resolving 8. RESPIRATORY DISTRESS: Describe your breathing.     Resp distress on Friday, tx with Epipen  at [facility], seen by EMS, refused ED. Denies current SOB 9. PRIOR REACTIONS: Have you had any severe allergic reactions to stings in the past? If Yes, ask: What happened?     Yes, anaphylaxis  Protocols used: Bee or Yellow Jacket Sting-A-AH

## 2024-07-14 ENCOUNTER — Ambulatory Visit (INDEPENDENT_AMBULATORY_CARE_PROVIDER_SITE_OTHER): Payer: PPO | Admitting: Family Medicine

## 2024-07-14 VITALS — BP 128/78 | HR 73 | Temp 98.0°F | Wt 168.6 lb

## 2024-07-14 DIAGNOSIS — E063 Autoimmune thyroiditis: Secondary | ICD-10-CM | POA: Diagnosis not present

## 2024-07-14 DIAGNOSIS — E782 Mixed hyperlipidemia: Secondary | ICD-10-CM | POA: Diagnosis not present

## 2024-07-14 DIAGNOSIS — M8589 Other specified disorders of bone density and structure, multiple sites: Secondary | ICD-10-CM

## 2024-07-14 DIAGNOSIS — M2550 Pain in unspecified joint: Secondary | ICD-10-CM | POA: Diagnosis not present

## 2024-07-14 MED ORDER — DICLOFENAC SODIUM 75 MG PO TBEC: 75.0000 mg | DELAYED_RELEASE_TABLET | Freq: Two times a day (BID) | ORAL | 1 refills | Status: DC

## 2024-07-14 MED ORDER — GABAPENTIN 100 MG PO CAPS: ORAL_CAPSULE | ORAL | 1 refills | Status: DC

## 2024-07-14 MED ORDER — LEVOTHYROXINE SODIUM 75 MCG PO TABS: 75.0000 ug | ORAL_TABLET | Freq: Every day | ORAL | 3 refills | Status: AC

## 2024-07-14 MED ORDER — EPINEPHRINE 0.3 MG/0.3ML IJ SOAJ: 0.3000 mg | INTRAMUSCULAR | 0 refills | Status: AC | PRN

## 2024-07-14 NOTE — Patient Instructions (Addendum)
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
## 2024-07-27 ENCOUNTER — Ambulatory Visit: Payer: PPO

## 2024-07-27 DIAGNOSIS — I495 Sick sinus syndrome: Secondary | ICD-10-CM | POA: Diagnosis not present

## 2024-07-27 LAB — CUP PACEART REMOTE DEVICE CHECK
Battery Remaining Longevity: 110 mo
Battery Remaining Percentage: 93 %
Battery Voltage: 3.01 V
Brady Statistic AP VP Percent: 1 %
Brady Statistic AP VS Percent: 16 %
Brady Statistic AS VP Percent: 1 %
Brady Statistic AS VS Percent: 79 %
Brady Statistic RA Percent Paced: 10 %
Brady Statistic RV Percent Paced: 1 %
Date Time Interrogation Session: 20250731040015
Implantable Lead Connection Status: 753985
Implantable Lead Connection Status: 753985
Implantable Lead Implant Date: 20240501
Implantable Lead Implant Date: 20240501
Implantable Lead Location: 753859
Implantable Lead Location: 753860
Implantable Pulse Generator Implant Date: 20240501
Lead Channel Impedance Value: 460 Ohm
Lead Channel Impedance Value: 490 Ohm
Lead Channel Pacing Threshold Amplitude: 0.875 V
Lead Channel Pacing Threshold Amplitude: 1 V
Lead Channel Pacing Threshold Pulse Width: 0.5 ms
Lead Channel Pacing Threshold Pulse Width: 0.5 ms
Lead Channel Sensing Intrinsic Amplitude: 4.8 mV
Lead Channel Sensing Intrinsic Amplitude: 5.1 mV
Lead Channel Setting Pacing Amplitude: 2.5 V
Lead Channel Setting Pacing Amplitude: 2.5 V
Lead Channel Setting Pacing Pulse Width: 0.5 ms
Lead Channel Setting Sensing Sensitivity: 0.5 mV
Pulse Gen Model: 2272
Pulse Gen Serial Number: 8181731

## 2024-07-28 ENCOUNTER — Ambulatory Visit: Payer: Self-pay | Admitting: Cardiology

## 2024-07-31 ENCOUNTER — Telehealth: Payer: Self-pay | Admitting: *Deleted

## 2024-07-31 NOTE — Telephone Encounter (Signed)
 Pt called that accidentally took Lamotrigine  100mg  tab 3.5 BID by accident twice this am at 5:30-6 am. Pt felt shaky and fell to the floor (denies injury). Pt stated had a hard time walking for a bit after that (is able to walk now). Denied Head injury or LOC. Please advise

## 2024-07-31 NOTE — Telephone Encounter (Signed)
 Spoke pt regarding instruction message below and voiced understanding. Pt verified doing much better w/o symptoms.

## 2024-08-03 ENCOUNTER — Encounter: Payer: Self-pay | Admitting: Cardiology

## 2024-08-07 ENCOUNTER — Inpatient Hospital Stay (HOSPITAL_COMMUNITY)

## 2024-08-07 ENCOUNTER — Encounter (HOSPITAL_COMMUNITY)

## 2024-08-07 ENCOUNTER — Inpatient Hospital Stay (HOSPITAL_COMMUNITY)
Admission: RE | Admit: 2024-08-07 | Discharge: 2024-08-11 | DRG: 101 | Disposition: A | Discharge status: Home / Self Care | Source: Ambulatory Visit | Attending: Neurology | Admitting: Neurology

## 2024-08-07 ENCOUNTER — Other Ambulatory Visit (HOSPITAL_COMMUNITY): Payer: Self-pay

## 2024-08-07 ENCOUNTER — Inpatient Hospital Stay (HOSPITAL_COMMUNITY): Admission: RE | Admit: 2024-08-07 | Source: Ambulatory Visit | Admitting: Neurology

## 2024-08-07 DIAGNOSIS — G40909 Epilepsy, unspecified, not intractable, without status epilepticus: Secondary | ICD-10-CM | POA: Diagnosis not present

## 2024-08-07 DIAGNOSIS — G2581 Restless legs syndrome: Secondary | ICD-10-CM | POA: Diagnosis not present

## 2024-08-07 DIAGNOSIS — J45909 Unspecified asthma, uncomplicated: Secondary | ICD-10-CM | POA: Diagnosis present

## 2024-08-07 DIAGNOSIS — D869 Sarcoidosis, unspecified: Secondary | ICD-10-CM | POA: Diagnosis not present

## 2024-08-07 DIAGNOSIS — I700 Atherosclerosis of aorta: Secondary | ICD-10-CM | POA: Diagnosis present

## 2024-08-07 DIAGNOSIS — R569 Unspecified convulsions: Secondary | ICD-10-CM

## 2024-08-07 DIAGNOSIS — M8580 Other specified disorders of bone density and structure, unspecified site: Secondary | ICD-10-CM | POA: Diagnosis not present

## 2024-08-07 DIAGNOSIS — Z807 Family history of other malignant neoplasms of lymphoid, hematopoietic and related tissues: Secondary | ICD-10-CM | POA: Diagnosis not present

## 2024-08-07 DIAGNOSIS — Z809 Family history of malignant neoplasm, unspecified: Secondary | ICD-10-CM

## 2024-08-07 DIAGNOSIS — F32A Depression, unspecified: Secondary | ICD-10-CM | POA: Diagnosis not present

## 2024-08-07 DIAGNOSIS — G4733 Obstructive sleep apnea (adult) (pediatric): Secondary | ICD-10-CM | POA: Diagnosis not present

## 2024-08-07 DIAGNOSIS — Z79890 Hormone replacement therapy (postmenopausal): Secondary | ICD-10-CM | POA: Diagnosis not present

## 2024-08-07 DIAGNOSIS — Z79899 Other long term (current) drug therapy: Secondary | ICD-10-CM | POA: Diagnosis not present

## 2024-08-07 DIAGNOSIS — I2510 Atherosclerotic heart disease of native coronary artery without angina pectoris: Secondary | ICD-10-CM | POA: Diagnosis not present

## 2024-08-07 DIAGNOSIS — E785 Hyperlipidemia, unspecified: Secondary | ICD-10-CM | POA: Diagnosis present

## 2024-08-07 DIAGNOSIS — H9012 Conductive hearing loss, unilateral, left ear, with unrestricted hearing on the contralateral side: Secondary | ICD-10-CM | POA: Diagnosis present

## 2024-08-07 DIAGNOSIS — E039 Hypothyroidism, unspecified: Secondary | ICD-10-CM | POA: Diagnosis present

## 2024-08-07 DIAGNOSIS — Z888 Allergy status to other drugs, medicaments and biological substances status: Secondary | ICD-10-CM | POA: Diagnosis not present

## 2024-08-07 DIAGNOSIS — Z8349 Family history of other endocrine, nutritional and metabolic diseases: Secondary | ICD-10-CM

## 2024-08-07 DIAGNOSIS — Z8249 Family history of ischemic heart disease and other diseases of the circulatory system: Secondary | ICD-10-CM

## 2024-08-07 DIAGNOSIS — Z91030 Bee allergy status: Secondary | ICD-10-CM | POA: Diagnosis not present

## 2024-08-07 DIAGNOSIS — Z860100 Personal history of colon polyps, unspecified: Secondary | ICD-10-CM

## 2024-08-07 DIAGNOSIS — Z7982 Long term (current) use of aspirin: Secondary | ICD-10-CM | POA: Diagnosis not present

## 2024-08-07 DIAGNOSIS — M797 Fibromyalgia: Secondary | ICD-10-CM | POA: Diagnosis present

## 2024-08-07 DIAGNOSIS — Z950 Presence of cardiac pacemaker: Secondary | ICD-10-CM | POA: Diagnosis not present

## 2024-08-07 DIAGNOSIS — Z841 Family history of disorders of kidney and ureter: Secondary | ICD-10-CM

## 2024-08-07 LAB — RAPID URINE DRUG SCREEN, HOSP PERFORMED
Amphetamines: NOT DETECTED
Barbiturates: NOT DETECTED
Benzodiazepines: NOT DETECTED
Cocaine: NOT DETECTED
Opiates: NOT DETECTED
Tetrahydrocannabinol: NOT DETECTED

## 2024-08-07 LAB — CBC WITH DIFFERENTIAL/PLATELET
Abs Immature Granulocytes: 0.04 K/uL (ref 0.00–0.07)
Basophils Absolute: 0.1 K/uL (ref 0.0–0.1)
Basophils Relative: 1 %
Eosinophils Absolute: 0.5 K/uL (ref 0.0–0.5)
Eosinophils Relative: 5 %
HCT: 37.4 % (ref 36.0–46.0)
Hemoglobin: 12.4 g/dL (ref 12.0–15.0)
Immature Granulocytes: 0 %
Lymphocytes Relative: 19 %
Lymphs Abs: 1.9 K/uL (ref 0.7–4.0)
MCH: 31.9 pg (ref 26.0–34.0)
MCHC: 33.2 g/dL (ref 30.0–36.0)
MCV: 96.1 fL (ref 80.0–100.0)
Monocytes Absolute: 0.8 K/uL (ref 0.1–1.0)
Monocytes Relative: 8 %
Neutro Abs: 6.4 K/uL (ref 1.7–7.7)
Neutrophils Relative %: 67 %
Platelets: 285 K/uL (ref 150–400)
RBC: 3.89 MIL/uL (ref 3.87–5.11)
RDW: 13.2 % (ref 11.5–15.5)
WBC: 9.8 K/uL (ref 4.0–10.5)
nRBC: 0 % (ref 0.0–0.2)

## 2024-08-07 LAB — PROTIME-INR
INR: 0.9 (ref 0.8–1.2)
Prothrombin Time: 13.2 s (ref 11.4–15.2)

## 2024-08-07 LAB — COMPREHENSIVE METABOLIC PANEL WITH GFR
ALT: 18 U/L (ref 0–44)
AST: 21 U/L (ref 15–41)
Albumin: 3.7 g/dL (ref 3.5–5.0)
Alkaline Phosphatase: 60 U/L (ref 38–126)
Anion gap: 9 (ref 5–15)
BUN: 27 mg/dL — ABNORMAL HIGH (ref 8–23)
CO2: 28 mmol/L (ref 22–32)
Calcium: 9.1 mg/dL (ref 8.9–10.3)
Chloride: 100 mmol/L (ref 98–111)
Creatinine, Ser: 0.75 mg/dL (ref 0.44–1.00)
GFR, Estimated: >60 mL/min (ref >60)
Glucose, Bld: 93 mg/dL (ref 70–99)
Potassium: 4 mmol/L (ref 3.5–5.1)
Sodium: 137 mmol/L (ref 135–145)
Total Bilirubin: 0.6 mg/dL (ref 0.0–1.2)
Total Protein: 7 g/dL (ref 6.5–8.1)

## 2024-08-07 LAB — PHOSPHORUS: Phosphorus: 4.3 mg/dL (ref 2.5–4.6)

## 2024-08-07 LAB — MAGNESIUM: Magnesium: 2.5 mg/dL — ABNORMAL HIGH (ref 1.7–2.4)

## 2024-08-07 MED ORDER — DULOXETINE HCL 60 MG PO CPEP
60.0000 mg | ORAL_CAPSULE | Freq: Every day | ORAL | Status: DC
  Administered 2024-08-08 – 2024-08-11 (×6): 60 mg via ORAL
  Filled 2024-08-07 (×4): qty 1

## 2024-08-07 MED ORDER — ENOXAPARIN SODIUM 40 MG/0.4ML IJ SOSY
40.0000 mg | PREFILLED_SYRINGE | INTRAMUSCULAR | Status: DC
  Administered 2024-08-07 – 2024-08-11 (×8): 40 mg via SUBCUTANEOUS
  Filled 2024-08-07 (×5): qty 0.4

## 2024-08-07 MED ORDER — METHADONE HCL 10 MG PO TABS
5.0000 mg | ORAL_TABLET | Freq: Three times a day (TID) | ORAL | Status: DC
  Administered 2024-08-07 – 2024-08-10 (×21): 5 mg via ORAL
  Filled 2024-08-07 (×12): qty 1

## 2024-08-07 MED ORDER — ASPIRIN 81 MG PO CHEW
81.0000 mg | CHEWABLE_TABLET | Freq: Every day | ORAL | Status: DC
  Administered 2024-08-08 – 2024-08-11 (×6): 81 mg via ORAL
  Filled 2024-08-07 (×4): qty 1

## 2024-08-07 MED ORDER — LEVOTHYROXINE SODIUM 75 MCG PO TABS
75.0000 ug | ORAL_TABLET | Freq: Every day | ORAL | Status: DC
  Administered 2024-08-08 – 2024-08-11 (×6): 75 ug via ORAL
  Filled 2024-08-07 (×4): qty 1

## 2024-08-07 MED ORDER — ACETAMINOPHEN 325 MG PO TABS
650.0000 mg | ORAL_TABLET | ORAL | Status: DC | PRN
  Administered 2024-08-08 – 2024-08-11 (×17): 650 mg via ORAL
  Filled 2024-08-07 (×10): qty 2

## 2024-08-07 MED ORDER — MIDAZOLAM HCL 2 MG/2ML IJ SOLN: 2.0000 mg | INTRAMUSCULAR | Status: DC | PRN

## 2024-08-07 MED ORDER — LABETALOL HCL 5 MG/ML IV SOLN: 5.0000 mg | INTRAVENOUS | Status: DC | PRN

## 2024-08-07 MED ORDER — SODIUM CHLORIDE 0.9% FLUSH
3.0000 mL | Freq: Two times a day (BID) | INTRAVENOUS | Status: DC
  Administered 2024-08-07 – 2024-08-11 (×15): 3 mL via INTRAVENOUS

## 2024-08-07 MED ORDER — ACETAMINOPHEN 650 MG RE SUPP: 650.0000 mg | RECTAL | Status: DC | PRN

## 2024-08-07 MED ORDER — GABAPENTIN 100 MG PO CAPS
100.0000 mg | ORAL_CAPSULE | Freq: Every evening | ORAL | Status: DC | PRN
  Administered 2024-08-09 (×2): 100 mg via ORAL
  Filled 2024-08-07: qty 1

## 2024-08-07 NOTE — Progress Notes (Signed)
 Patient arrived and oriented to the unit for direct admit/EMU

## 2024-08-07 NOTE — Plan of Care (Signed)
  Problem: Education: Goal: Knowledge of General Education information will improve Description: Including pain rating scale, medication(s)/side effects and non-pharmacologic comfort measures Outcome: Progressing   Problem: Health Behavior/Discharge Planning: Goal: Ability to manage health-related needs will improve Outcome: Progressing   Problem: Clinical Measurements: Goal: Ability to maintain clinical measurements within normal limits will improve Outcome: Progressing Goal: Will remain free from infection Outcome: Progressing Goal: Diagnostic test results will improve Outcome: Progressing Goal: Respiratory complications will improve Outcome: Progressing Goal: Cardiovascular complication will be avoided Outcome: Progressing   Problem: Activity: Goal: Risk for activity intolerance will decrease Outcome: Progressing   Problem: Nutrition: Goal: Adequate nutrition will be maintained Outcome: Progressing   Problem: Coping: Goal: Level of anxiety will decrease Outcome: Progressing   Problem: Elimination: Goal: Will not experience complications related to bowel motility Outcome: Progressing Goal: Will not experience complications related to urinary retention Outcome: Progressing   Problem: Pain Managment: Goal: General experience of comfort will improve and/or be controlled Outcome: Progressing   Problem: Safety: Goal: Ability to remain free from injury will improve Outcome: Progressing   Problem: Skin Integrity: Goal: Risk for impaired skin integrity will decrease Outcome: Progressing   Problem: Education: Goal: Expressions of having a comfortable level of knowledge regarding the disease process will increase Outcome: Progressing   Problem: Coping: Goal: Ability to adjust to condition or change in health will improve Outcome: Progressing Goal: Ability to identify appropriate support needs will improve Outcome: Progressing   Problem: Health Behavior/Discharge  Planning: Goal: Compliance with prescribed medication regimen will improve Outcome: Progressing   Problem: Medication: Goal: Risk for medication side effects will decrease Outcome: Progressing   Problem: Clinical Measurements: Goal: Complications related to the disease process, condition or treatment will be avoided or minimized Outcome: Progressing Goal: Diagnostic test results will improve Outcome: Progressing   Problem: Safety: Goal: Verbalization of understanding the information provided will improve Outcome: Progressing   Problem: Self-Concept: Goal: Level of anxiety will decrease Outcome: Progressing Goal: Ability to verbalize feelings about condition will improve Outcome: Progressing   Problem: Education: Goal: Expressions of having a comfortable level of knowledge regarding the disease process will increase Outcome: Progressing   Problem: Coping: Goal: Ability to adjust to condition or change in health will improve Outcome: Progressing Goal: Ability to identify appropriate support needs will improve Outcome: Progressing   Problem: Health Behavior/Discharge Planning: Goal: Compliance with prescribed medication regimen will improve Outcome: Progressing   Problem: Clinical Measurements: Goal: Complications related to the disease process, condition or treatment will be avoided or minimized Outcome: Progressing   Problem: Safety: Goal: Verbalization of understanding the information provided will improve Outcome: Progressing   Problem: Self-Concept: Goal: Level of anxiety will decrease Outcome: Progressing Goal: Ability to verbalize feelings about condition will improve Outcome: Progressing

## 2024-08-07 NOTE — Progress Notes (Signed)
 LTM maint complete - no skin breakdown seen.  All leads attached Monitored by [facility].
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
## 2024-08-07 NOTE — TOC CM/SW Note (Signed)
 Transition of Care [facility]) - Inpatient Brief Assessment   Patient Details  Name: Grace Schmitt MRN: [id number] Date of Birth: Jan 23, 1946  Transition of Care [facility]) CM/SW Contact:    Andrez JULIANNA George, RN Phone Number: 08/07/2024, 1:32 PM   Clinical Narrative:  Pt is admitted to EMU.    Transition of Care Asessment: Insurance and Status: Insurance coverage has been reviewed Patient has primary care physician: Yes Home environment has been reviewed: home with spouse   Prior/Current Home Services: No current home services   Readmission risk has been reviewed: Yes Transition of care needs: no transition of care needs at this time

## 2024-08-07 NOTE — Progress Notes (Signed)
 LTM EEG recording with CT compatible wires. [facility] is monitoring.

## 2024-08-08 ENCOUNTER — Inpatient Hospital Stay (HOSPITAL_COMMUNITY)

## 2024-08-08 DIAGNOSIS — R569 Unspecified convulsions: Secondary | ICD-10-CM | POA: Diagnosis not present

## 2024-08-08 MED ORDER — ORAL CARE MOUTH RINSE
15.0000 mL | OROMUCOSAL | Status: DC | PRN
Start: 2024-08-08 — End: 2024-08-11

## 2024-08-08 NOTE — Progress Notes (Signed)
 Subjective: States she had a hard time falling asleep last night and thought she might have had an event between 2 to 3 AM.  ROS: negative except above  Examination  Vital signs in last 24 hours: Temp:  [97.6 F (36.4 C)-98.5 F (36.9 C)] 98.5 F (36.9 C) (08/12 1205) Pulse Rate:  [61-66] 66 (08/12 1205) Resp:  [16-18] 16 (08/12 1205) BP: (114-141)/(60-71) 137/69 (08/12 1205) SpO2:  [94 %-100 %] 100 % (08/12 1205)  General: lying in bed, NAD Neuro: AO x 3, cranial nerves grossly intact, 5/5 in upper extremities, sensory intact to light touch, FTN intact bilaterally   Basic Metabolic Panel: Recent Labs  Lab 08/07/24 1000  NA 137  K 4.0  CL 100  CO2 28  GLUCOSE 93  BUN 27*  CREATININE 0.75  CALCIUM  9.1  MG 2.5*  PHOS 4.3    CBC: Recent Labs  Lab 08/07/24 1000  WBC 9.8  NEUTROABS 6.4  HGB 12.4  HCT 37.4  MCV 96.1  PLT 285     Coagulation Studies: Recent Labs    08/07/24 1000  LABPROT 13.2  INR 0.9    Imaging No new brain imaging overnight  ASSESSMENT AND PLAN:78 year old female admitted to epilepsy monitoring unit for characterization of spells.   Seizure-like episodes - Continue video EEG monitoring for characterization of spells -Hold lamotrigine  - Plan for hyperventilation, photic stimulation and sleep deprivation today -Discussed overnight EEG findings - Continue seizure precautions -as needed IV Versed  for clinical seizures   Depression Fibromyalgia Restless leg syndrome Hypothyroidism    I personally spent a total of 35 minutes in the care of the patient today including getting/reviewing separately obtained history, performing a medically appropriate exam/evaluation, counseling and educating, placing orders, referring and communicating with other health care professionals, documenting clinical information in the EHR, independently interpreting results, and coordinating care.           Arlin Krebs Ep[facility] For questions after 5pm please refer to AMION to reach the Neurologist on call

## 2024-08-08 NOTE — Plan of Care (Signed)
  Problem: Health Behavior/Discharge Planning: Goal: Ability to manage health-related needs will improve Outcome: Progressing   Problem: Clinical Measurements: Goal: Will remain free from infection Outcome: Progressing   Problem: Clinical Measurements: Goal: Respiratory complications will improve Outcome: Progressing   Problem: Clinical Measurements: Goal: Cardiovascular complication will be avoided Outcome: Progressing   Problem: Elimination: Goal: Will not experience complications related to urinary retention Outcome: Progressing

## 2024-08-08 NOTE — Progress Notes (Signed)
 LTM maint complete - no skin breakdown under: FP1,FP2,F7. Activations complete.

## 2024-08-08 NOTE — Procedures (Signed)
 Patient Name: Grace Schmitt  MRN: [id number]  Epilepsy Attending: Arlin MALVA Krebs  Referring Physician/Provider: Krebs Arlin MALVA, MD Duration: 08/07/2024 0954 to 08/08/2024 0954  Patient history: 78 year old female admitted to epilepsy monitoring unit for characterization of spells. EEG to evaluate for seizure  Level of alertness: Awake, asleep  AEDs during EEG study: None  Technical aspects: This EEG study was done with scalp electrodes positioned according to the 10-20 International system of electrode placement. Electrical activity was reviewed with band pass filter of 1-70Hz , sensitivity of 7 uV/mm, display speed of 61mm/sec with a 60Hz  notched filter applied as appropriate. EEG data were recorded continuously and digitally stored.  Video monitoring was available and reviewed as appropriate.  Description: The posterior dominant rhythm consists of 8 Hz activity of moderate voltage (25-35 uV) seen predominantly in posterior head regions, symmetric and reactive to eye opening and eye closing. Sleep was characterized by vertex waves, sleep spindles (12 to 14 Hz), maximal frontocentral region.  Hyperventilation did not show any EEG change.  Physiologic photic driving was not seen during photic stimulation.    IMPRESSION: This study is within normal limits. No seizures or epileptiform discharges were seen throughout the recording.  A normal interictal EEG does not exclude the diagnosis of epilepsy.   Phillipe Clemon O Nohea Kras

## 2024-08-09 ENCOUNTER — Inpatient Hospital Stay (HOSPITAL_COMMUNITY)

## 2024-08-09 DIAGNOSIS — R569 Unspecified convulsions: Secondary | ICD-10-CM | POA: Diagnosis not present

## 2024-08-09 MED ORDER — ONDANSETRON HCL 4 MG/2ML IJ SOLN
4.0000 mg | Freq: Four times a day (QID) | INTRAMUSCULAR | Status: DC | PRN
  Administered 2024-08-09 – 2024-08-10 (×7): 4 mg via INTRAVENOUS
  Filled 2024-08-09 (×4): qty 2

## 2024-08-09 NOTE — Procedures (Addendum)
 Patient Name: Grace Schmitt  MRN: [id number]  Epilepsy Attending: Arlin MALVA Krebs  Referring Physician/Provider: Krebs Arlin MALVA, MD Duration: 08/08/2024 [phone] to 08/09/2024 0954   Patient history: 78 year old female admitted to epilepsy monitoring unit for characterization of spells. EEG to evaluate for seizure   Level of alertness: Awake, asleep   AEDs during EEG study: None   Technical aspects: This EEG study was done with scalp electrodes positioned according to the 10-20 International system of electrode placement. Electrical activity was reviewed with band pass filter of 1-70Hz , sensitivity of 7 uV/mm, display speed of 36mm/sec with a 60Hz  notched filter applied as appropriate. EEG data were recorded continuously and digitally stored.  Video monitoring was available and reviewed as appropriate.   Description: The posterior dominant rhythm consists of 8 Hz activity of moderate voltage (25-35 uV) seen predominantly in posterior head regions, symmetric and reactive to eye opening and eye closing. Sleep was characterized by vertex waves, sleep spindles (12 to 14 Hz), maximal frontocentral region.    Event button was pressed on 08/09/2024 at 0923.  Patient reported having trouble speaking as well as paresthesias.  Concomitant EEG before, during and after the event did not show any EEG changes suggest seizure.   IMPRESSION: This study is within normal limits. No seizures or epileptiform discharges were seen throughout the recording.   One event was recorded on 08/09/2024 at 0923 during which patient had trouble speaking as well as paresthesias without concomitant EEG change.  This was most likely not an epileptic event.   Grace Schmitt

## 2024-08-09 NOTE — Progress Notes (Signed)
 EMU LTM maint complete - no skin breakdown under: F3,F4

## 2024-08-09 NOTE — Plan of Care (Signed)
  Problem: Health Behavior/Discharge Planning: Goal: Ability to manage health-related needs will improve Outcome: Progressing   Problem: Clinical Measurements: Goal: Will remain free from infection Outcome: Progressing   Problem: Clinical Measurements: Goal: Diagnostic test results will improve Outcome: Progressing   

## 2024-08-09 NOTE — Progress Notes (Signed)
 Subjective: Had 1 episode this morning felt typical like her episodes at home where she had trouble speaking.  Also reports nausea this morning  ROS: negative except above  Examination  Vital signs in last 24 hours: Temp:  [97.9 F (36.6 C)-98.4 F (36.9 C)] 97.9 F (36.6 C) (08/13 0728) Pulse Rate:  [65-69] 65 (08/13 0728) Resp:  [16-18] 16 (08/13 0728) BP: (131-153)/(61-79) 131/62 (08/13 0728) SpO2:  [93 %-100 %] 96 % (08/13 0728)  General: lying in bed, NAD Neuro: AO x 3, cranial nerves grossly intact, 5/5 in upper extremities, sensory intact to light touch, FTN intact bilaterally   Basic Metabolic Panel: Recent Labs  Lab 08/07/24 1000  NA 137  K 4.0  CL 100  CO2 28  GLUCOSE 93  BUN 27*  CREATININE 0.75  CALCIUM  9.1  MG 2.5*  PHOS 4.3    CBC: Recent Labs  Lab 08/07/24 1000  WBC 9.8  NEUTROABS 6.4  HGB 12.4  HCT 37.4  MCV 96.1  PLT 285     Coagulation Studies: Recent Labs    08/07/24 1000  LABPROT 13.2  INR 0.9    Imaging No new brain imaging overnight   ASSESSMENT AND PLAN:78 year old female admitted to epilepsy monitoring unit for characterization of spells.   Seizure-like episodes - Continue video EEG monitoring for characterization of spells -Hold lamotrigine  -Discussed overnight EEG findings - Continue seizure precautions -as needed IV Versed  for clinical seizures   Depression Fibromyalgia Restless leg syndrome Hypothyroidism     I personally spent a total of 25 minutes in the care of the patient today including getting/reviewing separately obtained history, performing a medically appropriate exam/evaluation, counseling and educating, placing orders, referring and communicating with other health care professionals, documenting clinical information in the EHR, independently interpreting results, and coordinating care.             Arlin Krebs Epilepsy [facility] For questions after 5pm please refer to AMION to  reach the Neurologist on call

## 2024-08-09 NOTE — Progress Notes (Signed)
 LTM maint complete - no skin breakdown seen . Patient Complained of HA, nurse was informed per patient request. [facility] monitored.

## 2024-08-10 DIAGNOSIS — R569 Unspecified convulsions: Secondary | ICD-10-CM | POA: Diagnosis not present

## 2024-08-10 MED ORDER — KETOROLAC TROMETHAMINE 15 MG/ML IJ SOLN: 15.0000 mg | Freq: Three times a day (TID) | INTRAMUSCULAR | Status: DC | PRN

## 2024-08-10 NOTE — Procedures (Signed)
 Patient Name: Grace Schmitt  MRN: [id number]  Epilepsy Attending: Arlin MALVA Krebs  Referring Physician/Provider: Krebs Arlin MALVA, MD Duration: 08/09/2024 [phone] to 08/10/2024 0954   Patient history: 78 year old female admitted to epilepsy monitoring unit for characterization of spells. EEG to evaluate for seizure   Level of alertness: Awake, asleep   AEDs during EEG study: None   Technical aspects: This EEG study was done with scalp electrodes positioned according to the 10-20 International system of electrode placement. Electrical activity was reviewed with band pass filter of 1-70Hz , sensitivity of 7 uV/mm, display speed of 57mm/sec with a 60Hz  notched filter applied as appropriate. EEG data were recorded continuously and digitally stored.  Video monitoring was available and reviewed as appropriate.   Description: The posterior dominant rhythm consists of 8 Hz activity of moderate voltage (25-35 uV) seen predominantly in posterior head regions, symmetric and reactive to eye opening and eye closing. Sleep was characterized by vertex waves, sleep spindles (12 to 14 Hz), maximal frontocentral region. Sharp transients were noted in right temporal region.   Event button was pressed on 08/09/2024 at 1427 and on 08/10/2024 at 0226. Patient reported having a weird feeling.  Concomitant EEG before, during and after the event did not show any EEG changes suggest seizure.  Event button was pressed on 08/10/2024 at 0021.  Patient reported breaking out in hot sweat. Concomitant EEG before, during and after the event did not show any EEG changes suggest seizure.   IMPRESSION: This study is within normal limits. No seizures or definite epileptiform discharges were seen throughout the recording.   Three events were recorded as described above without concomitant EEG change. These were most likely NON epileptic events.  Antoinetta Berrones O Jaleal Schliep

## 2024-08-10 NOTE — Plan of Care (Signed)
 Problem: Education: Goal: Knowledge of General Education information will improve Description: Including pain rating scale, medication(s)/side effects and non-pharmacologic comfort measures 08/10/2024 0505 by Magdalene Errol SAILOR, RN Outcome: Progressing 08/10/2024 0505 by Magdalene Errol SAILOR, RN Outcome: Progressing   Problem: Health Behavior/Discharge Planning: Goal: Ability to manage health-related needs will improve 08/10/2024 0505 by Magdalene Errol SAILOR, RN Outcome: Progressing 08/10/2024 0505 by Magdalene Errol SAILOR, RN Outcome: Progressing   Problem: Clinical Measurements: Goal: Ability to maintain clinical measurements within normal limits will improve 08/10/2024 0505 by Magdalene Errol SAILOR, RN Outcome: Progressing 08/10/2024 0505 by Magdalene Errol SAILOR, RN Outcome: Progressing Goal: Will remain free from infection 08/10/2024 0505 by Magdalene Errol SAILOR, RN Outcome: Progressing 08/10/2024 0505 by Magdalene Errol SAILOR, RN Outcome: Progressing Goal: Diagnostic test results will improve 08/10/2024 0505 by Magdalene Errol SAILOR, RN Outcome: Progressing 08/10/2024 0505 by Magdalene Errol SAILOR, RN Outcome: Progressing Goal: Respiratory complications will improve 08/10/2024 0505 by Magdalene Errol SAILOR, RN Outcome: Progressing 08/10/2024 0505 by Magdalene Errol SAILOR, RN Outcome: Progressing Goal: Cardiovascular complication will be avoided 08/10/2024 0505 by Magdalene Errol SAILOR, RN Outcome: Progressing 08/10/2024 0505 by Magdalene Errol SAILOR, RN Outcome: Progressing   Problem: Activity: Goal: Risk for activity intolerance will decrease 08/10/2024 0505 by Magdalene Errol SAILOR, RN Outcome: Progressing 08/10/2024 0505 by Magdalene Errol SAILOR, RN Outcome: Progressing   Problem: Nutrition: Goal: Adequate nutrition will be maintained 08/10/2024 0505 by Magdalene Errol SAILOR, RN Outcome: Progressing 08/10/2024 0505 by Magdalene Errol SAILOR, RN Outcome: Progressing   Problem: Coping: Goal: Level of anxiety will decrease 08/10/2024 0505 by Magdalene Errol SAILOR, RN Outcome:  Progressing 08/10/2024 0505 by Magdalene Errol SAILOR, RN Outcome: Progressing   Problem: Elimination: Goal: Will not experience complications related to bowel motility 08/10/2024 0505 by Magdalene Errol SAILOR, RN Outcome: Progressing 08/10/2024 0505 by Magdalene Errol SAILOR, RN Outcome: Progressing Goal: Will not experience complications related to urinary retention 08/10/2024 0505 by Magdalene Errol SAILOR, RN Outcome: Progressing 08/10/2024 0505 by Magdalene Errol SAILOR, RN Outcome: Progressing   Problem: Pain Managment: Goal: General experience of comfort will improve and/or be controlled 08/10/2024 0505 by Magdalene Errol SAILOR, RN Outcome: Progressing 08/10/2024 0505 by Magdalene Errol SAILOR, RN Outcome: Progressing   Problem: Safety: Goal: Ability to remain free from injury will improve 08/10/2024 0505 by Magdalene Errol SAILOR, RN Outcome: Progressing 08/10/2024 0505 by Magdalene Errol SAILOR, RN Outcome: Progressing   Problem: Skin Integrity: Goal: Risk for impaired skin integrity will decrease 08/10/2024 0505 by Magdalene Errol SAILOR, RN Outcome: Progressing 08/10/2024 0505 by Magdalene Errol SAILOR, RN Outcome: Progressing   Problem: Education: Goal: Expressions of having a comfortable level of knowledge regarding the disease process will increase 08/10/2024 0505 by Magdalene Errol SAILOR, RN Outcome: Progressing 08/10/2024 0505 by Magdalene Errol SAILOR, RN Outcome: Progressing   Problem: Coping: Goal: Ability to adjust to condition or change in health will improve 08/10/2024 0505 by Magdalene Errol SAILOR, RN Outcome: Progressing 08/10/2024 0505 by Magdalene Errol SAILOR, RN Outcome: Progressing Goal: Ability to identify appropriate support needs will improve 08/10/2024 0505 by Magdalene Errol SAILOR, RN Outcome: Progressing 08/10/2024 0505 by Magdalene Errol SAILOR, RN Outcome: Progressing   Problem: Health Behavior/Discharge Planning: Goal: Compliance with prescribed medication regimen will improve 08/10/2024 0505 by Magdalene Errol SAILOR, RN Outcome: Progressing 08/10/2024  0505 by Magdalene Errol SAILOR, RN Outcome: Progressing   Problem: Medication: Goal: Risk for medication side effects will decrease 08/10/2024 0505 by Magdalene Errol SAILOR, RN Outcome: Progressing 08/10/2024 0505 by Magdalene Errol SAILOR, RN Outcome: Progressing  Problem: Clinical Measurements: Goal: Complications related to the disease process, condition or treatment will be avoided or minimized 08/10/2024 0505 by Magdalene Errol SAILOR, RN Outcome: Progressing 08/10/2024 0505 by Magdalene Errol SAILOR, RN Outcome: Progressing Goal: Diagnostic test results will improve 08/10/2024 0505 by Magdalene Errol SAILOR, RN Outcome: Progressing 08/10/2024 0505 by Magdalene Errol SAILOR, RN Outcome: Progressing   Problem: Safety: Goal: Verbalization of understanding the information provided will improve 08/10/2024 0505 by Magdalene Errol SAILOR, RN Outcome: Progressing 08/10/2024 0505 by Magdalene Errol SAILOR, RN Outcome: Progressing   Problem: Self-Concept: Goal: Level of anxiety will decrease 08/10/2024 0505 by Magdalene Errol SAILOR, RN Outcome: Progressing 08/10/2024 0505 by Magdalene Errol SAILOR, RN Outcome: Progressing Goal: Ability to verbalize feelings about condition will improve 08/10/2024 0505 by Magdalene Errol SAILOR, RN Outcome: Progressing 08/10/2024 0505 by Magdalene Errol SAILOR, RN Outcome: Progressing   Problem: Education: Goal: Expressions of having a comfortable level of knowledge regarding the disease process will increase 08/10/2024 0505 by Magdalene Errol SAILOR, RN Outcome: Progressing 08/10/2024 0505 by Magdalene Errol SAILOR, RN Outcome: Progressing   Problem: Coping: Goal: Ability to adjust to condition or change in health will improve 08/10/2024 0505 by Magdalene Errol SAILOR, RN Outcome: Progressing 08/10/2024 0505 by Magdalene Errol SAILOR, RN Outcome: Progressing Goal: Ability to identify appropriate support needs will improve 08/10/2024 0505 by Magdalene Errol SAILOR, RN Outcome: Progressing 08/10/2024 0505 by Magdalene Errol SAILOR, RN Outcome: Progressing   Problem: Health  Behavior/Discharge Planning: Goal: Compliance with prescribed medication regimen will improve 08/10/2024 0505 by Magdalene Errol SAILOR, RN Outcome: Progressing 08/10/2024 0505 by Magdalene Errol SAILOR, RN Outcome: Progressing   Problem: Clinical Measurements: Goal: Complications related to the disease process, condition or treatment will be avoided or minimized 08/10/2024 0505 by Magdalene Errol SAILOR, RN Outcome: Progressing 08/10/2024 0505 by Magdalene Errol SAILOR, RN Outcome: Progressing   Problem: Safety: Goal: Verbalization of understanding the information provided will improve 08/10/2024 0505 by Magdalene Errol SAILOR, RN Outcome: Progressing 08/10/2024 0505 by Magdalene Errol SAILOR, RN Outcome: Progressing   Problem: Self-Concept: Goal: Level of anxiety will decrease 08/10/2024 0505 by Magdalene Errol SAILOR, RN Outcome: Progressing 08/10/2024 0505 by Magdalene Errol SAILOR, RN Outcome: Progressing Goal: Ability to verbalize feelings about condition will improve 08/10/2024 0505 by Magdalene Errol SAILOR, RN Outcome: Progressing 08/10/2024 0505 by Magdalene Errol SAILOR, RN Outcome: Progressing

## 2024-08-10 NOTE — Plan of Care (Signed)
  Problem: Health Behavior/Discharge Planning: Goal: Ability to manage health-related needs will improve Outcome: Progressing   Problem: Clinical Measurements: Goal: Respiratory complications will improve Outcome: Progressing   Problem: Activity: Goal: Risk for activity intolerance will decrease Outcome: Progressing   Problem: Elimination: Goal: Will not experience complications related to bowel motility Outcome: Progressing   Problem: Pain Managment: Goal: General experience of comfort will improve and/or be controlled Outcome: Progressing   Problem: Safety: Goal: Ability to remain free from injury will improve Outcome: Progressing

## 2024-08-10 NOTE — Progress Notes (Signed)
 Subjective: Hide multiple events overnight.  States they were in her typical spells but states it is hard to describe the feeling other than feels like tingling.  Also has been having some headache and nausea  ROS: negative except above Examination  Vital signs in last 24 hours: Temp:  [98.2 F (36.8 C)-98.5 F (36.9 C)] 98.5 F (36.9 C) (08/14 1157) Pulse Rate:  [59-71] 71 (08/14 1157) Resp:  [14-17] 14 (08/14 1157) BP: (111-140)/(57-93) 111/63 (08/14 1157) SpO2:  [97 %-100 %] 99 % (08/14 1157)  General: lying in bed, NAD Neuro: AO x 3, cranial nerves grossly intact, 5/5 in upper extremities, sensory intact to light touch, FTN intact bilaterally   Basic Metabolic Panel: Recent Labs  Lab 08/07/24 1000  NA 137  K 4.0  CL 100  CO2 28  GLUCOSE 93  BUN 27*  CREATININE 0.75  CALCIUM  9.1  MG 2.5*  PHOS 4.3    CBC: Recent Labs  Lab 08/07/24 1000  WBC 9.8  NEUTROABS 6.4  HGB 12.4  HCT 37.4  MCV 96.1  PLT 285     Coagulation Studies: No results for input(s): LABPROT, INR in the last 72 hours.  Imaging No new brain imaging overnight   ASSESSMENT AND PLAN:78 year old female admitted to epilepsy monitoring unit for characterization of spells.   Seizure-like episodes - Continue video EEG monitoring for characterization of spells -Hold lamotrigine  -Discussed overnight EEG findings.  During events, patient did not have any EEG change.  However she now has sharp transients in right temporal region which were noted in her previous EEG as well.  Discussed that differentials include focal epilepsy versus amyloid spells.  Patient states her main concern is 1 of these episodes in fact her long-term memory.  I discussed that I think she might benefit from a neuropsych evaluation specifically for her memory.  However it is unlikely that these episodes are affecting her long-term memory because they have been relatively stable in the last few years - Continue seizure  precautions -as needed IV Versed  for clinical seizures   Depression Fibromyalgia Restless leg syndrome Hypothyroidism     I personally spent a total of 37 minutes in the care of the patient today including getting/reviewing separately obtained history, performing a medically appropriate exam/evaluation, counseling and educating, placing orders, referring and communicating with other health care professionals, documenting clinical information in the EHR, independently interpreting results, and coordinating care.     Arlin Krebs Epilepsy [facility] For questions after 5pm please refer to AMION to reach the Neurologist on call

## 2024-08-10 NOTE — Care Management Important Message (Signed)
 Important Message  Patient Details  Name: Grace Schmitt MRN: [id number] Date of Birth: 03/01/1946   Important Message Given:  Yes - Medicare IM     Claretta Deed 08/10/2024, 2:42 PM

## 2024-08-11 ENCOUNTER — Inpatient Hospital Stay (HOSPITAL_COMMUNITY)

## 2024-08-11 DIAGNOSIS — R569 Unspecified convulsions: Secondary | ICD-10-CM | POA: Diagnosis not present

## 2024-08-11 MED ORDER — GABAPENTIN 100 MG PO CAPS: 100.0000 mg | ORAL_CAPSULE | Freq: Every evening | ORAL | 0 refills | Status: AC | PRN

## 2024-08-11 MED ORDER — LAMOTRIGINE 25 MG PO TABS
350.0000 mg | ORAL_TABLET | Freq: Once | ORAL | Status: AC
  Administered 2024-08-11: 350 mg via ORAL
  Filled 2024-08-11: qty 2

## 2024-08-11 NOTE — Procedures (Addendum)
 Patient Name: Grace Schmitt  MRN: [id number]  Epilepsy Attending: Arlin MALVA Krebs  Referring Physician/Provider: Krebs Arlin MALVA, MD Duration: 08/10/2024 0954 to 08/11/2024 1044   Patient history: 78 year old female admitted to epilepsy monitoring unit for characterization of spells. EEG to evaluate for seizure   Level of alertness: Awake, asleep   AEDs during EEG study: None   Technical aspects: This EEG study was done with scalp electrodes positioned according to the 10-20 International system of electrode placement. Electrical activity was reviewed with band pass filter of 1-70Hz , sensitivity of 7 uV/mm, display speed of 64mm/sec with a 60Hz  notched filter applied as appropriate. EEG data were recorded continuously and digitally stored.  Video monitoring was available and reviewed as appropriate.   Description: The posterior dominant rhythm consists of 8 Hz activity of moderate voltage (25-35 uV) seen predominantly in posterior head regions, symmetric and reactive to eye opening and eye closing. Sleep was characterized by vertex waves, sleep spindles (12 to 14 Hz), maximal frontocentral region. Sharp transients were noted in right temporal region.  IMPRESSION: This study is within normal limits. No seizures or definite epileptiform discharges were seen throughout the recording.  Creola Krotz O Islam Villescas

## 2024-08-11 NOTE — Discharge Summary (Signed)
 Physician Discharge Summary  Patient ID: Grace Schmitt MRN: [id number] DOB/AGE: [birth year] 78 y.o.  Admit date: 08/07/2024 Discharge date: 08/11/2024  Admission Diagnoses seizure  Discharge Diagnoses: Seizure  Discharged Condition: stable  Hospital Course: Grace Schmitt was admitted to epilepsy monitoring unit between 08/07/2024 to 08/11/2024.  During this time, she underwent continuous video EEG monitoring.  Antiseizure medications were held.  Hyperventilation, photic stimulation and sleep deprivation were performed.  Multiple typical events were recorded as noted in EEG report without concomitant EEG changes.  However as antiseizure medications were held, her EEG did show sharp transients in right temporal region predominantly during sleep.  This did not meet criteria for epileptiform discharges however were noted after antiseizure medications were held and after she had typical events.  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  Therefore I would recommend continuing antiseizure medications.  I discussed with her epileptologist Dr. Aquino as well this patient about increasing lamotrigine  versus continuing same dose versus switching to Vimpat.  Patient has been very sensitive to medications in the past.  Therefore for now the plan is to continue same dose of lamotrigine  and if needed gradually increase the dose.  Please continue seizure precautions as well as follow-up with Dr. Georjean  Of note patient expressed concern about memory during this admission.  Please consider neuropsych evaluation as an outpatient.  Consults: None  Significant Diagnostic Studies: Video eeg   Description: The posterior dominant  rhythm consists of 8 Hz activity of moderate voltage (25-35 uV) seen predominantly in posterior head regions, symmetric and reactive to eye opening and eye closing. Sleep was characterized by vertex waves, sleep spindles (12 to 14 Hz), maximal frontocentral region. Sharp transients were noted in right temporal region. Hyperventilation did not show any EEG change. Physiologic photic driving was not seen during photic stimulation.    Event button was pressed on 08/09/2024 at 1427 and on 08/10/2024 at 0226. Patient reported having a weird feeling.  Concomitant EEG before, during and after the event did not show any EEG changes suggest seizure.   Event button was pressed on 08/10/2024 at 0021.  Patient reported breaking out in hot sweat. Concomitant EEG before, during and after the event did not show any EEG changes suggest seizure.   IMPRESSION: This study is within normal limits. No seizures or definite epileptiform discharges were seen throughout the recording.   Three events were recorded as described above without concomitant EEG change. These were most likely NON epileptic events.   Treatments: Continue lamotrigine  50 mg twice daily, continue gabapentin  which has been prescribed for RLS  Discharge Exam: Blood pressure 139/63, pulse 65, temperature 98.4 F (36.9 C), temperature source Oral, resp. rate 19, SpO2 100%. General: lying in bed, NAD Neuro: AO x 3, cranial nerves grossly intact, 5/5 in upper extremities, sensory intact to light touch, FTN intact bilaterally    Discharge disposition: 01-Home or Self Care   Discharge Instructions     Call MD for:   Complete by: As directed    If patient has another seizure, call 911 and bring them back to the ED if: A.  The seizure lasts longer than 5 minutes.      B.  The patient doesn't wake shortly after  the seizure or has new problems such as difficulty seeing, speaking or moving following the seizure C.  The patient was injured during the  seizure D.  The patient has a temperature over 102 F (39C) E.  The patient vomited during the seizure and now is having trouble breathing      Diet - low sodium heart healthy   Complete by: As directed    Discharge instructions   Complete by: As directed    During the Seizure   - First, ensure adequate ventilation and place patients on the floor on their left side  Loosen clothing around the neck and ensure the airway is patent. If the patient is clenching the teeth, do not force the mouth open with any object as this can cause severe damage - Remove all items from the surrounding that can be hazardous. The patient may be oblivious to what's happening and may not even know what he or she is doing. If the patient is confused and wandering, either gently guide him/her away and block access to outside areas - Reassure the individual and be comforting - Call 911. In most cases, the seizure ends before EMS arrives. However, there are cases when seizures may last over 3 to 5 minutes. Or the individual may have developed breathing difficulties or severe injuries. If a pregnant patient or a person with diabetes develops a seizure, it is prudent to call an ambulance.   After the Seizure (Postictal Stage)   After a seizure, most patients experience confusion, fatigue, muscle pain and/or a headache. Thus, one should permit the individual to sleep. For the next few days, reassurance is essential. Being calm and helping reorient the person is also of importance.   Most seizures are painless and end spontaneously. Seizures are not harmful to others but can lead to complications such as stress on the lungs, brain and the heart. Individuals with prior lung problems may develop labored breathing and respiratory distress.        Increase activity slowly   Complete by: As directed    Other Restrictions   Complete by: As directed    Seizure precautions: Per [place]  DMV statutes, patients with  seizures are not allowed to drive until they have been seizure-free for six months and cleared by a physician    Use caution when using heavy equipment or power tools. Avoid working on ladders or at heights. Take showers instead of baths. Ensure the water temperature is not too high on the home water heater. Do not go swimming alone. Do not lock yourself in a room alone (i.e. bathroom). When caring for infants or small children, sit down when holding, feeding, or changing them to minimize risk of injury to the child in the event you have a seizure. Maintain good sleep hygiene. Avoid alcohol.         Allergies as of 08/11/2024       Reactions   Bee Venom Anaphylaxis   Required EMS and epi adminstration   Lipitor [atorvastatin  Calcium ] Other (See Comments)   Myalgias Arthralgia    Phenergan  [promethazine  Hcl] Other (See Comments)   Worsens restless legs   Statins Other (See Comments)   Myalgias         Medication List     STOP taking these medications    cloBAZam  10 MG tablet Commonly known as: ONFI        TAKE these medications    albuterol  108 (90 Base) MCG/ACT inhaler Commonly known as:  VENTOLIN  HFA Inhale 1-2 puffs into the lungs every 6 (six) hours as needed.   ascorbic acid 500 MG tablet Commonly known as: VITAMIN C Take 500 mg by mouth daily.   aspirin  EC 81 MG tablet Take 81 mg by mouth at bedtime.   BENEFIBER PO Take 1 Scoop by mouth at bedtime.   CO Q 10 PO Take 1 capsule by mouth at bedtime.   diclofenac  75 MG EC tablet Commonly known as: VOLTAREN  Take 1 tablet (75 mg total) by mouth 2 (two) times daily.   DULoxetine  60 MG capsule Commonly known as: CYMBALTA  Take 60 mg by mouth daily.   EPINEPHrine  0.3 mg/0.3 mL Soaj injection Commonly known as: EPI-PEN Inject 0.3 mg into the muscle as needed for anaphylaxis.   gabapentin  100 MG capsule Commonly known as: NEURONTIN  Take 1 capsule (100 mg total) by mouth at bedtime as needed (restless  leg). What changed:  how much to take how to take this when to take this reasons to take this additional instructions   IRON  (FERROUS SULFATE) PO Take 1 tablet by mouth at bedtime.   KRILL OIL PO Take 2 capsules by mouth in the morning and at bedtime.   lamoTRIgine  100 MG tablet Commonly known as: LAMICTAL  Take 3 and 1/2 tablets twice a day   levothyroxine  75 MCG tablet Commonly known as: SYNTHROID  Take 1 tablet (75 mcg total) by mouth daily before breakfast.   loratadine  10 MG tablet Commonly known as: CLARITIN  Take 10 mg by mouth at bedtime.   LORazepam  0.5 MG tablet Commonly known as: Ativan  Take 1 tablet as needed for seizure clusters. Do not take more than 2 a day.   MAGNESIUM  OXIDE PO Take 1 tablet by mouth at bedtime.   methadone  5 MG tablet Commonly known as: DOLOPHINE  Take 5 mg by mouth 3 (three) times daily. For restless legs 2pm, 6pm, 10 pm   Repatha  140 MG/ML Sosy Generic drug: Evolocumab  Inject 140 mg into the skin every 14 (fourteen) days.   VITAMIN D -3 PO Take 1 tablet by mouth at bedtime.   ZINC PO Take 1 tablet by mouth at bedtime.         I personally spent a total of 40 minutes in the care of the patient today including getting/reviewing separately obtained history, performing a medically appropriate exam/evaluation, counseling and educating, placing orders, referring and communicating with other health care professionals, documenting clinical information in the EHR, independently interpreting results, and coordinating care.         Signed: Meaghan Whistler O Tavyn Kurka 08/11/2024, 11:34 AM

## 2024-08-11 NOTE — TOC Transition Note (Signed)
 Transition of Care [facility]) - Discharge Note   Patient Details  Name: SUZANA SOHAIL MRN: [id number] Date of Birth: 03-10-1946  Transition of [facility]) CM/SW Contact:  Andrez JULIANNA George, RN Phone Number: 08/11/2024, 11:37 AM   Clinical Narrative:     Pt is discharging home with self care. No needs per IP Care Management.  Final next level of care: Home/Self Care Barriers to Discharge: No Barriers Identified   Patient Goals and CMS Choice            Discharge Placement                       Discharge Plan and Services Additional resources added to the After Visit Summary for                                       Social Drivers of Health (SDOH) Interventions SDOH Screenings   Food Insecurity: No Food Insecurity (08/08/2024)  Housing: Low Risk  (08/08/2024)  Transportation Needs: No Transportation Needs (08/08/2024)  Utilities: At Risk (08/08/2024)  Alcohol Screen: Low Risk  (01/05/2024)  Depression (PHQ2-9): Medium Risk (07/14/2024)  Financial Resource Strain: High Risk (07/13/2024)  Physical Activity: Insufficiently Active (07/13/2024)  Social Connections: Socially Integrated (08/08/2024)  Stress: Stress Concern Present (07/13/2024)  Tobacco Use: Low Risk  (07/10/2024)  Health Literacy: Adequate Health Literacy (01/05/2024)     Readmission Risk Interventions     No data to display

## 2024-08-11 NOTE — Discharge Instructions (Signed)
 You were admitted to epilepsy monitoring unit between 08/07/2024 to 08/11/2024.  During this time, he underwent continuous video EEG monitoring.  Antiseizure medication was held.  Hyperventilation, photic summation and sleep deprivation were performed.  Multiple typical events were recorded without concomitant EEG change.  You did have sharp transients in the right temporal region which have been seen in the EEG before but are not definitively epileptiform.  However given the semiology of the episodes (transient speech disturbance at times associated with bladder incontinence followed by headache and poor memory) is still concerning for focal epilepsy, likely arising from right temporal region.  Therefore we discussed continuing lamotrigine  versus increasing dose versus changing to Vimpat.  For now you would like to continue the current dose of lamotrigine .  Please continue to follow-up with Dr. Georjean.  Please continue to follow seizure precautions.

## 2024-08-14 ENCOUNTER — Telehealth: Payer: Self-pay

## 2024-08-14 NOTE — Transitions of Care (Post Inpatient/ED Visit) (Signed)
   08/14/2024  Name: Grace Schmitt MRN: [id number] DOB: [birth year]  Today's TOC FU Call Status: Today's TOC FU Call Status:: Unsuccessful Call (1st Attempt) Unsuccessful Call (1st Attempt) Date: 08/14/24  Attempted to reach the patient regarding the most recent Inpatient/ED visit.  Follow Up Plan: Additional outreach attempts will be made to reach the patient to complete the Transitions of Care (Post Inpatient/ED visit) call.   Garrin Kirwan J. Qualyn Oyervides RN, MSN [facility], [facility] RN Care Manager Direct Dial: [phone]  Fax: [phone] Website: [email or web address]

## 2024-08-15 ENCOUNTER — Telehealth: Payer: Self-pay

## 2024-08-15 NOTE — Transitions of Care (Post Inpatient/ED Visit) (Signed)
 08/15/2024  Name: Grace Schmitt MRN: [id number] DOB: August 06, 1946  Today's TOC FU Call Status: Today's TOC FU Call Status:: Successful TOC FU Call Completed TOC FU Call Complete Date: 08/15/24 Patient's Name and Date of Birth confirmed.  Transition Care Management Follow-up Telephone Call How have you been since you were released from the hospital?: Better Any questions or concerns?: No  Items Reviewed: Did you receive and understand the discharge instructions provided?: Yes Medications obtained,verified, and reconciled?: Yes (Medications Reviewed) Any new allergies since your discharge?: No Dietary orders reviewed?: Yes Type of Diet Ordered:: low sodium Do you have support at home?: Yes People in Home [RPT]: spouse  Medications Reviewed Today: Medications Reviewed Today     Reviewed by Rumalda Alan PENNER, RN (Registered Nurse) on 08/15/24 at 1442  Med List Status: <None>   Medication Order Taking? Sig Documenting Provider Last Dose Status Informant  albuterol  (VENTOLIN  HFA) 108 (90 Base) MCG/ACT inhaler [id number] Yes Inhale 1-2 puffs into the lungs every 6 (six) hours as needed. Parrett, Madelin RAMAN, NP  Active Self, Spouse/Significant Other, Pharmacy Records  aspirin  EC 81 MG tablet [id number] Yes Take 81 mg by mouth at bedtime. [provider]  Active Self, Spouse/Significant Other, Pharmacy Records  Cholecalciferol  (VITAMIN D -3 PO) [id number] Yes Take 1 tablet by mouth at bedtime. [provider]  Active Self, Spouse/Significant Other, Pharmacy Records  Coenzyme Q10 (CO Q 10 PO) [id number] Yes Take 1 capsule by mouth at bedtime. [provider]  Active Self, Spouse/Significant Other, Pharmacy Records  diclofenac  (VOLTAREN ) 75 MG EC tablet [id number] Yes Take 1 tablet (75 mg total) by mouth 2 (two) times daily. Catherine Fuller A, DO  Active Self, Spouse/Significant Other, Pharmacy Records  DULoxetine  (CYMBALTA ) 60 MG capsule [id number] Yes Take 60 mg by mouth  daily. [provider]  Active Self, Spouse/Significant Other, Pharmacy Records           Med Note (CRUTHIS, CHLOE C   Mon Aug 07, 2024  9:50 AM) Pt is adamant she is still taking this medication daily.   EPINEPHrine  0.3 mg/0.3 mL IJ SOAJ injection [id number]  Inject 0.3 mg into the muscle as needed for anaphylaxis.  Patient not taking: Reported on 08/15/2024   Catherine Fuller A, DO  Active Self, Spouse/Significant Other, Pharmacy Records  Evolocumab  (REPATHA ) 140 MG/ML SOSY [id number] Yes Inject 140 mg into the skin every 14 (fourteen) days. Catherine, Renee A, DO  Active Self, Spouse/Significant Other, Pharmacy Records  gabapentin  (NEURONTIN ) 100 MG capsule [id number] Yes Take 1 capsule (100 mg total) by mouth at bedtime as needed (restless leg). Yadav, Priyanka O, MD  Active   IRON , FERROUS SULFATE, PO [id number] Yes Take 1 tablet by mouth at bedtime. [provider]  Active Self, Spouse/Significant Other, Pharmacy Records  KRILL OIL PO [id number] Yes Take 2 capsules by mouth in the morning and at bedtime. [provider]  Active Self, Spouse/Significant Other, Pharmacy Records  lamoTRIgine  (LAMICTAL ) 100 MG tablet [id number] Yes Take 3 and 1/2 tablets twice a day Georjean Darice HERO, MD  Active Self, Spouse/Significant Other, Pharmacy Records  levothyroxine  (SYNTHROID ) 75 MCG tablet [id number] Yes Take 1 tablet (75 mcg total) by mouth daily before breakfast. Catherine, Renee A, DO  Active Self, Spouse/Significant Other, Pharmacy Records  loratadine  (CLARITIN ) 10 MG tablet [id number] Yes Take 10 mg by mouth at bedtime. [provider]  Active Self, Spouse/Significant Other, Pharmacy Records  LORazepam  (ATIVAN ) 0.5 MG tablet [id number] Yes Take 1 tablet as needed for seizure  clusters. Do not take more than 2 a day. Georjean Darice HERO, MD  Active Self, Spouse/Significant Other, Pharmacy Records  MAGNESIUM  OXIDE PO [id number]  Take 1 tablet by mouth at bedtime.  Patient not taking: Reported  on 08/15/2024   [provider]  Active Self, Spouse/Significant Other, Pharmacy Records  methadone  (DOLOPHINE ) 5 MG tablet [id number] Yes Take 5 mg by mouth 3 (three) times daily. For restless legs 2pm, 6pm, 10 pm [provider]  Active Self, Spouse/Significant Other, Pharmacy Records           Med Note (GARNER, TIFFANY L   Mon Jan 20, 2021 10:00 AM)    Multiple Vitamins-Minerals (ZINC PO) [id number] Yes Take 1 tablet by mouth at bedtime. [provider]  Active Self, Spouse/Significant Other, Pharmacy Records    Discontinued 05/06/20 1439 (Discontinued by provider)   vitamin C (ASCORBIC ACID) 500 MG tablet [id number] Yes Take 500 mg by mouth daily. [provider]  Active Self, Spouse/Significant Other, Pharmacy Records  Wheat Dextrin (BENEFIBER PO) [id number] Yes Take 1 Scoop by mouth at bedtime. [provider]  Active Self, Spouse/Significant Other, Pharmacy Records            Home Care and Equipment/Supplies: Were Home Health Services Ordered?: No Any new equipment or medical supplies ordered?: No  Functional Questionnaire: Do you need assistance with bathing/showering or dressing?: No Do you need assistance with meal preparation?: No Do you need assistance with eating?: No Do you have difficulty maintaining continence: No Do you need assistance with getting out of bed/getting out of a chair/moving?: No Do you have difficulty managing or taking your medications?: No  Follow up appointments reviewed: PCP Follow-up appointment confirmed?: No MD Provider Line Number:[phone] Given: No Specialist Hospital Follow-up appointment confirmed?: Yes Date of Specialist follow-up appointment?: 08/11/24 Follow-Up Specialty Provider:: neuro Do you need transportation to your follow-up appointment?: No Do you understand care options if your condition(s) worsen?: Yes-patient verbalized understanding  SDOH Interventions Today    Flowsheet Row  Most Recent Value  SDOH Interventions   Food Insecurity Interventions Intervention Not Indicated  Housing Interventions Intervention Not Indicated  Transportation Interventions Intervention Not Indicated  Utilities Interventions Intervention Not Indicated   Reviewed hospital admission for seizure testing.  Patient reports that she is doing well. Reports she is back to her baseline. Reports that she has a scheduled neurology follow up. Does not have PCP follow up but will call and schedule herself.  Patient denies any needs today. Reviewed seizure precautions. Reviewed that patient has all her medication  and is taking them as prescribed. Reports a good support with her husband to drive her to appointments.  Reviewed and offered 30 day TOC program and patient declined. Provided my contact information if patient needs assistance.   Alan Ee, RN, BSN, CEN [employer]- Transition of Care Team.  [facility] [phone]

## 2024-08-17 ENCOUNTER — Ambulatory Visit: Payer: Self-pay

## 2024-08-17 NOTE — Telephone Encounter (Signed)
 FYI Only or Action Required?: FYI only for provider.  Patient was last seen in primary care on 07/14/2024 by Catherine Fuller A, DO.  Called Nurse Triage reporting Back Pain.  Symptoms began several days ago.  Interventions attempted: OTC medications: tylenol .  Symptoms are: unchanged.  Triage Disposition: See PCP When Office is Open (Within 3 Days)  Patient/caregiver understands and will follow disposition?: Yes   Copied from CRM #[id number]. Topic: Clinical - Red Word Triage >> Aug 17, 2024  2:47 PM [place] J wrote: [facility] that prompted transfer to Nurse Triage: Patient is having back pain and will like to schedule an appointment. Reason for Disposition  [1] MODERATE back pain (e.g., interferes with normal activities) AND [2] present > 3 days  Answer Assessment - Initial Assessment Questions 1. ONSET: When did the pain begin? (e.g., minutes, hours, days)     Couple days ago 2. LOCATION: Where does it hurt? (upper, mid or lower back)     Below the waist, right hand side 3. SEVERITY: How bad is the pain?  (e.g., Scale 1-10; mild, moderate, or severe)     moderate 4. PATTERN: Is the pain constant? (e.g., yes, no; constant, intermittent)      constant 5. RADIATION: Does the pain shoot into your legs or somewhere else?     no 6. CAUSE:  What do you think is causing the back pain?      unknown 7. BACK OVERUSE:  Any recent lifting of heavy objects, strenuous work or exercise?     no 8. MEDICINES: What have you taken so far for the pain? (e.g., nothing, acetaminophen , NSAIDS)     tylenol  9. NEUROLOGIC SYMPTOMS: Do you have any weakness, numbness, or problems with bowel/bladder control?     no 10. OTHER SYMPTOMS: Do you have any other symptoms? (e.g., fever, abdomen pain, burning with urination, blood in urine)       no 11. PREGNANCY: Is there any chance you are pregnant? When was your last menstrual period?       na  Protocols used: Back Pain-A-AH

## 2024-08-17 NOTE — Telephone Encounter (Signed)
 No further action needed.

## 2024-08-21 ENCOUNTER — Encounter: Payer: Self-pay | Admitting: Family Medicine

## 2024-08-21 ENCOUNTER — Ambulatory Visit (INDEPENDENT_AMBULATORY_CARE_PROVIDER_SITE_OTHER): Admitting: Family Medicine

## 2024-08-21 VITALS — BP 126/80 | HR 63 | Temp 98.3°F | Wt 170.0 lb

## 2024-08-21 DIAGNOSIS — S39012A Strain of muscle, fascia and tendon of lower back, initial encounter: Secondary | ICD-10-CM

## 2024-08-21 DIAGNOSIS — M791 Myalgia, unspecified site: Secondary | ICD-10-CM | POA: Insufficient documentation

## 2024-08-21 MED ORDER — TIZANIDINE HCL 4 MG PO TABS: 2.0000 mg | ORAL_TABLET | Freq: Two times a day (BID) | ORAL | 1 refills | Status: DC | PRN

## 2024-08-21 MED ORDER — METHYLPREDNISOLONE ACETATE 80 MG/ML IJ SUSP
80.0000 mg | Freq: Once | INTRAMUSCULAR | Status: AC
  Administered 2024-08-21: 80 mg via INTRAMUSCULAR

## 2024-08-21 NOTE — Patient Instructions (Addendum)
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
## 2024-08-22 ENCOUNTER — Encounter: Payer: Self-pay | Admitting: Family Medicine

## 2024-08-23 ENCOUNTER — Encounter: Payer: Self-pay | Admitting: Family Medicine

## 2024-08-23 ENCOUNTER — Ambulatory Visit: Payer: Self-pay

## 2024-08-23 ENCOUNTER — Ambulatory Visit (INDEPENDENT_AMBULATORY_CARE_PROVIDER_SITE_OTHER): Admitting: Family Medicine

## 2024-08-23 ENCOUNTER — Ambulatory Visit (HOSPITAL_BASED_OUTPATIENT_CLINIC_OR_DEPARTMENT_OTHER)
Admission: RE | Admit: 2024-08-23 | Discharge: 2024-08-23 | Disposition: A | Discharge status: Home / Self Care | Source: Ambulatory Visit | Attending: Family Medicine | Admitting: Family Medicine

## 2024-08-23 VITALS — BP 136/72 | HR 78 | Temp 98.2°F | Wt 167.6 lb

## 2024-08-23 DIAGNOSIS — M5416 Radiculopathy, lumbar region: Secondary | ICD-10-CM | POA: Diagnosis not present

## 2024-08-23 DIAGNOSIS — M79604 Pain in right leg: Secondary | ICD-10-CM | POA: Diagnosis not present

## 2024-08-23 DIAGNOSIS — M4316 Spondylolisthesis, lumbar region: Secondary | ICD-10-CM | POA: Insufficient documentation

## 2024-08-23 DIAGNOSIS — M48061 Spinal stenosis, lumbar region without neurogenic claudication: Secondary | ICD-10-CM | POA: Diagnosis not present

## 2024-08-23 DIAGNOSIS — M545 Low back pain, unspecified: Secondary | ICD-10-CM | POA: Diagnosis not present

## 2024-08-23 MED ORDER — PREDNISONE 20 MG PO TABS
ORAL_TABLET | ORAL | 0 refills | Status: DC
Start: 2024-08-23 — End: 2024-09-06

## 2024-08-23 NOTE — Telephone Encounter (Signed)
 noted

## 2024-08-23 NOTE — Patient Instructions (Signed)
 Return in about 2 weeks (around 09/06/2024) for back pain.        Great to see you today.  I have refilled the medication(s) we provide.   If labs were collected or images ordered, we will inform you of  results once we have received them and reviewed. We will contact you either by echart message, or telephone call.  Please give ample time to the testing facility, and our office to run,  receive and review results. Please do not call inquiring of results, even if you can see them in your chart. We will contact you as soon as we are able. If it has been over 1 week since the test was completed, and you have not yet heard from us , then please call us .    - echart message- for normal results that have been seen by the patient already.   - telephone call: abnormal results or if patient has not viewed results in their echart.  If a referral to a specialist was entered for you, please call us  in 2 weeks if you have not heard from the specialist office to schedule.
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
## 2024-08-23 NOTE — Telephone Encounter (Signed)
 FYI Only or Action Required?: Action required by provider: update on patient condition.  Patient was last seen in primary care on 08/21/2024 by Catherine Fuller A, DO.  Called Nurse Triage reporting Fatigue and Extremity Weakness.  Symptoms began several days ago.  Interventions attempted: Other: stopped medication, consulted pharmacist.  Symptoms are: gradually improving.  Triage Disposition: See HCP Within 4 Hours (Or PCP Triage)  Patient/caregiver understands and will follow disposition?: Yes, but will wait   Copied from CRM #[id number]. Topic: Clinical - Red Word Triage >> Aug 23, 2024  9:39 AM Suzen RAMAN wrote: Red Word that prompted transfer to Nurse Triage: reaction to Medication(tiZANidine ). Patient unable to walk Reason for Disposition  [1] MODERATE weakness (e.g., interferes with work, school, normal activities) AND [2] cause unknown  (Exceptions: Weakness from acute minor illness or poor fluid intake; weakness is chronic and not worse.)  Answer Assessment - Initial Assessment Questions Additional info: Bilateral leg weakness started on Monday, when going upstairs her right knee buckled and she fell landing on knees. She spoke to pharmacist about weakness and if this is a side effect of new med,  he advised her to stop medication until speaking with pcp. She last took a dose of Tizanidine  on Monday night. Weakness has improved since yesterday but still needing cane to ambulate. She does report that tizanidine  was helping her back pain and would like replacement medication.  Follow up scheduled with pcp today for new onset leg weakness with fall.    1. DESCRIPTION: Describe how you are feeling.     Leg weakness, right knee buckled 2. SEVERITY: How bad is it?  Can you stand and walk?     Needing help with ambulation via cane for stablility 3. ONSET: When did these symptoms begin? (e.g., hours, days, weeks, months)     Monday 4. CAUSE: What do you think is causing the  weakness or fatigue? (e.g., not drinking enough fluids, medical problem, trouble sleeping)     Tizanidine  5. NEW MEDICINES:  Have you started on any new medicines recently? (e.g., opioid pain medicines, benzodiazepines, muscle relaxants, antidepressants, antihistamines, neuroleptics, beta blockers)     Tizanidine   6. OTHER SYMPTOMS: Do you have any other symptoms? (e.g., chest pain, fever, cough, SOB, vomiting, diarrhea, bleeding, other areas of pain)     Denies tingling, numbness, chest pain, shortness of breath, oral swelling, and all other symptoms.  Protocols used: Weakness (Generalized) and Fatigue-A-AH

## 2024-09-04 ENCOUNTER — Ambulatory Visit: Payer: Self-pay | Admitting: Family Medicine

## 2024-09-06 ENCOUNTER — Ambulatory Visit: Admitting: Family Medicine

## 2024-09-06 ENCOUNTER — Encounter: Payer: Self-pay | Admitting: Family Medicine

## 2024-09-06 VITALS — BP 128/84 | HR 76 | Temp 98.3°F | Wt 168.0 lb

## 2024-09-06 DIAGNOSIS — M79604 Pain in right leg: Secondary | ICD-10-CM

## 2024-09-06 DIAGNOSIS — M48061 Spinal stenosis, lumbar region without neurogenic claudication: Secondary | ICD-10-CM | POA: Diagnosis not present

## 2024-09-06 DIAGNOSIS — M4316 Spondylolisthesis, lumbar region: Secondary | ICD-10-CM | POA: Diagnosis not present

## 2024-09-06 DIAGNOSIS — M5416 Radiculopathy, lumbar region: Secondary | ICD-10-CM | POA: Diagnosis not present

## 2024-09-06 DIAGNOSIS — M545 Low back pain, unspecified: Secondary | ICD-10-CM | POA: Insufficient documentation

## 2024-09-06 DIAGNOSIS — S39012A Strain of muscle, fascia and tendon of lower back, initial encounter: Secondary | ICD-10-CM

## 2024-09-06 NOTE — Patient Instructions (Signed)
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
## 2024-09-11 ENCOUNTER — Encounter: Payer: Self-pay | Admitting: Neurology

## 2024-09-11 ENCOUNTER — Ambulatory Visit: Admitting: Neurology

## 2024-09-11 VITALS — BP 122/69 | HR 73 | Ht 62.0 in | Wt 168.0 lb

## 2024-09-11 DIAGNOSIS — G40009 Localization-related (focal) (partial) idiopathic epilepsy and epileptic syndromes with seizures of localized onset, not intractable, without status epilepticus: Secondary | ICD-10-CM | POA: Diagnosis not present

## 2024-09-11 MED ORDER — LAMOTRIGINE 100 MG PO TABS: ORAL_TABLET | ORAL | 3 refills | Status: AC
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
## 2024-09-11 NOTE — Patient Instructions (Addendum)
 Good to see you! Continue Lamotrigine  350mg  twice a day. A referral will be sent to Dr. Jerene for second opinion. Continue seizure calendar. Follow-up in 6 months, call for any changes.    Seizure Precautions: 1. If medication has been prescribed for you to prevent seizures, take it exactly as directed.  Do not stop taking the medicine without talking to your doctor first, even if you have not had a seizure in a long time.   2. Avoid activities in which a seizure would cause danger to yourself or to others.  Don't operate dangerous machinery, swim alone, or climb in high or dangerous places, such as on ladders, roofs, or girders.  Do not drive unless your doctor says you may.  3. If you have any warning that you may have a seizure, lay down in a safe place where you can't hurt yourself.    4.  No driving for 6 months from last seizure, as per   state law.   Please refer to the following link on the Epilepsy Foundation of America's website for more information: http://www.epilepsyfoundation.org/answerplace/Social/driving/drivingu.cfm   5.  Maintain good sleep hygiene. Avoid alcohol.  6.  Contact your doctor if you have any problems that may be related to the medicine you are taking.  7.  Call 911 and bring the patient back to the ED if:        A.  The seizure lasts longer than 5 minutes.       B.  The patient doesn't awaken shortly after the seizure  C.  The patient has new problems such as difficulty seeing, speaking or moving  D.  The patient was injured during the seizure  E.  The patient has a temperature over 102 F (39C)  F.  The patient vomited and now is having trouble breathing

## 2024-09-12 ENCOUNTER — Other Ambulatory Visit: Payer: Self-pay | Admitting: *Deleted

## 2024-09-12 ENCOUNTER — Other Ambulatory Visit: Payer: Self-pay | Admitting: Family Medicine

## 2024-09-12 DIAGNOSIS — R569 Unspecified convulsions: Secondary | ICD-10-CM

## 2024-09-15 DIAGNOSIS — G4733 Obstructive sleep apnea (adult) (pediatric): Secondary | ICD-10-CM | POA: Diagnosis not present

## 2024-09-18 DIAGNOSIS — M545 Low back pain, unspecified: Secondary | ICD-10-CM | POA: Diagnosis not present

## 2024-09-21 DIAGNOSIS — M545 Low back pain, unspecified: Secondary | ICD-10-CM | POA: Diagnosis not present

## 2024-09-25 DIAGNOSIS — M545 Low back pain, unspecified: Secondary | ICD-10-CM | POA: Diagnosis not present

## 2024-09-27 NOTE — Progress Notes (Signed)
 Remote PPM Transmission

## 2024-09-28 ENCOUNTER — Encounter: Payer: Self-pay | Admitting: Neurology

## 2024-09-29 NOTE — Telephone Encounter (Signed)
**Note De-identified  [place] Obfuscation** Please advise 

## 2024-10-02 DIAGNOSIS — M545 Low back pain, unspecified: Secondary | ICD-10-CM | POA: Diagnosis not present

## 2024-10-05 DIAGNOSIS — M545 Low back pain, unspecified: Secondary | ICD-10-CM | POA: Diagnosis not present

## 2024-10-10 DIAGNOSIS — M545 Low back pain, unspecified: Secondary | ICD-10-CM | POA: Diagnosis not present

## 2024-10-11 DIAGNOSIS — H6122 Impacted cerumen, left ear: Secondary | ICD-10-CM | POA: Diagnosis not present

## 2024-10-11 DIAGNOSIS — Z9622 Myringotomy tube(s) status: Secondary | ICD-10-CM | POA: Diagnosis not present

## 2024-10-11 DIAGNOSIS — H73892 Other specified disorders of tympanic membrane, left ear: Secondary | ICD-10-CM | POA: Diagnosis not present

## 2024-10-12 DIAGNOSIS — M545 Low back pain, unspecified: Secondary | ICD-10-CM | POA: Diagnosis not present

## 2024-10-16 DIAGNOSIS — M545 Low back pain, unspecified: Secondary | ICD-10-CM | POA: Diagnosis not present

## 2024-10-19 DIAGNOSIS — L821 Other seborrheic keratosis: Secondary | ICD-10-CM | POA: Diagnosis not present

## 2024-10-19 DIAGNOSIS — L718 Other rosacea: Secondary | ICD-10-CM | POA: Diagnosis not present

## 2024-10-24 DIAGNOSIS — M545 Low back pain, unspecified: Secondary | ICD-10-CM | POA: Diagnosis not present

## 2024-10-26 ENCOUNTER — Telehealth: Payer: Self-pay

## 2024-10-26 ENCOUNTER — Ambulatory Visit (INDEPENDENT_AMBULATORY_CARE_PROVIDER_SITE_OTHER): Payer: PPO

## 2024-10-26 DIAGNOSIS — I495 Sick sinus syndrome: Secondary | ICD-10-CM

## 2024-10-26 DIAGNOSIS — M545 Low back pain, unspecified: Secondary | ICD-10-CM | POA: Diagnosis not present

## 2024-10-26 LAB — CUP PACEART REMOTE DEVICE CHECK
Battery Remaining Longevity: 108 mo
Battery Remaining Percentage: 91 %
Battery Voltage: 3.01 V
Brady Statistic AP VP Percent: 1 %
Brady Statistic AP VS Percent: 15 %
Brady Statistic AS VP Percent: 1 %
Brady Statistic AS VS Percent: 80 %
Brady Statistic RA Percent Paced: 9.4 %
Brady Statistic RV Percent Paced: 1 %
Date Time Interrogation Session: 20251030040014
Implantable Lead Connection Status: 753985
Implantable Lead Connection Status: 753985
Implantable Lead Implant Date: 20240501
Implantable Lead Implant Date: 20240501
Implantable Lead Location: 753859
Implantable Lead Location: 753860
Implantable Pulse Generator Implant Date: 20240501
Lead Channel Impedance Value: 460 Ohm
Lead Channel Impedance Value: 490 Ohm
Lead Channel Pacing Threshold Amplitude: 0.875 V
Lead Channel Pacing Threshold Amplitude: 1 V
Lead Channel Pacing Threshold Pulse Width: 0.5 ms
Lead Channel Pacing Threshold Pulse Width: 0.5 ms
Lead Channel Sensing Intrinsic Amplitude: 5 mV
Lead Channel Sensing Intrinsic Amplitude: 5.7 mV
Lead Channel Setting Pacing Amplitude: 2.5 V
Lead Channel Setting Pacing Amplitude: 2.5 V
Lead Channel Setting Pacing Pulse Width: 0.5 ms
Lead Channel Setting Sensing Sensitivity: 0.5 mV
Pulse Gen Model: 2272
Pulse Gen Serial Number: 8181731

## 2024-10-26 NOTE — Telephone Encounter (Signed)
 Received PT reevaluation to be signed. Will place in pcp desk for signature

## 2024-10-27 ENCOUNTER — Ambulatory Visit: Payer: Self-pay | Admitting: Cardiology

## 2024-10-27 NOTE — Telephone Encounter (Signed)
Completed and returned to CMA work basket ?

## 2024-10-30 NOTE — Telephone Encounter (Signed)
 Form faxed

## 2024-10-31 NOTE — Progress Notes (Signed)
 Remote PPM Transmission

## 2024-11-03 DIAGNOSIS — M545 Low back pain, unspecified: Secondary | ICD-10-CM | POA: Diagnosis not present

## 2024-11-14 DIAGNOSIS — R0789 Other chest pain: Secondary | ICD-10-CM | POA: Diagnosis not present

## 2024-11-21 DIAGNOSIS — R0789 Other chest pain: Secondary | ICD-10-CM | POA: Diagnosis not present

## 2024-12-06 ENCOUNTER — Encounter: Payer: Self-pay | Admitting: Pulmonary Disease

## 2024-12-06 ENCOUNTER — Ambulatory Visit: Admitting: Pulmonary Disease

## 2024-12-06 ENCOUNTER — Ambulatory Visit

## 2024-12-06 VITALS — BP 126/70 | HR 69 | Temp 97.9°F | Ht 62.0 in | Wt 175.0 lb

## 2024-12-06 DIAGNOSIS — D869 Sarcoidosis, unspecified: Secondary | ICD-10-CM

## 2024-12-06 DIAGNOSIS — J4520 Mild intermittent asthma, uncomplicated: Secondary | ICD-10-CM

## 2024-12-06 DIAGNOSIS — G4733 Obstructive sleep apnea (adult) (pediatric): Secondary | ICD-10-CM

## 2024-12-06 LAB — PULMONARY FUNCTION TEST
DL/VA % pred: 106 %
DL/VA: 4.41 ml/min/mmHg/L
DLCO unc % pred: 83 %
DLCO unc: 14.68 ml/min/mmHg
FEF 25-75 Post: 1.78 L/s
FEF 25-75 Pre: 1.34 L/s
FEF2575-%Change-Post: 33 %
FEF2575-%Pred-Post: 132 %
FEF2575-%Pred-Pre: 99 %
FEV1-%Change-Post: 6 %
FEV1-%Pred-Post: 86 %
FEV1-%Pred-Pre: 80 %
FEV1-Post: 1.54 L
FEV1-Pre: 1.45 L
FEV1FVC-%Change-Post: 5 %
FEV1FVC-%Pred-Pre: 107 %
FEV6-%Change-Post: 0 %
FEV6-%Pred-Post: 80 %
FEV6-%Pred-Pre: 79 %
FEV6-Post: 1.83 L
FEV6-Pre: 1.82 L
FEV6FVC-%Pred-Post: 105 %
FEV6FVC-%Pred-Pre: 105 %
FVC-%Change-Post: 0 %
FVC-%Pred-Post: 75 %
FVC-%Pred-Pre: 75 %
FVC-Post: 1.83 L
FVC-Pre: 1.82 L
Post FEV1/FVC ratio: 84 %
Post FEV6/FVC ratio: 100 %
Pre FEV1/FVC ratio: 80 %
Pre FEV6/FVC Ratio: 100 %
RV % pred: 86 %
RV: 1.96 L
TLC % pred: 81 %
TLC: 3.89 L

## 2024-12-06 MED ORDER — ALBUTEROL SULFATE HFA 108 (90 BASE) MCG/ACT IN AERS: 1.0000 | INHALATION_SPRAY | Freq: Four times a day (QID) | RESPIRATORY_TRACT | 11 refills | Status: AC | PRN

## 2024-12-06 NOTE — Progress Notes (Signed)
 Full pft performed today

## 2024-12-06 NOTE — Assessment & Plan Note (Signed)
 Grace Schmitt

## 2024-12-06 NOTE — Patient Instructions (Signed)
 Continue albuterol  inhaler 1-2 puffs every 4-6 hours as needed  Your breathing tests are overall normal  Continue CPAP at night  Recommend finding a way to ventilate the home better to reduce your radon exposure  Follow up in 1 year, call sooner if needed

## 2024-12-06 NOTE — Assessment & Plan Note (Signed)
°  Orders:   albuterol  (VENTOLIN  HFA) 108 (90 Base) MCG/ACT inhaler; Inhale 1-2 puffs into the lungs every 6 (six) hours as needed.

## 2024-12-06 NOTE — Patient Instructions (Signed)
 Full pft performed today

## 2024-12-06 NOTE — Progress Notes (Unsigned)
 Established Patient Pulmonology Office Visit   Subjective:  Patient ID: Grace Schmitt, female    DOB: [birth year]  MRN: [id number]  CC:  Chief Complaint  Patient presents with   Asthma    F/u on PFT.  Breathing is about the same.    Discussed the use of AI scribe software for clinical note transcription with the patient, who gave verbal consent to proceed.  History of Present Illness       {PULM QUESTIONNAIRES (Optional):33196}  ROS  {History (Optional):23778}  Current Outpatient Medications:    albuterol  (VENTOLIN  HFA) 108 (90 Base) MCG/ACT inhaler, Inhale 1-2 puffs into the lungs every 6 (six) hours as needed., Disp: 8 g, Rfl: 2   aspirin  EC 81 MG tablet, Take 81 mg by mouth at bedtime., Disp: , Rfl:    Cholecalciferol  (VITAMIN D -3 PO), Take 1 tablet by mouth at bedtime., Disp: , Rfl:    Coenzyme Q10 (CO Q 10 PO), Take 1 capsule by mouth at bedtime., Disp: , Rfl:    diclofenac  (VOLTAREN ) 75 MG EC tablet, Take 1 tablet (75 mg total) by mouth 2 (two) times daily., Disp: 180 tablet, Rfl: 1   DULoxetine  (CYMBALTA ) 60 MG capsule, Take 60 mg by mouth daily., Disp: , Rfl:    EPINEPHrine  0.3 mg/0.3 mL IJ SOAJ injection, Inject 0.3 mg into the muscle as needed for anaphylaxis., Disp: 1 each, Rfl: 0   Evolocumab  (REPATHA ) 140 MG/ML SOSY, Inject 140 mg into the skin every 14 (fourteen) days., Disp: 2 mL, Rfl: 11   gabapentin  (NEURONTIN ) 100 MG capsule, Take 1 capsule (100 mg total) by mouth at bedtime as needed (restless leg)., Disp: 1 capsule, Rfl: 0   IRON , FERROUS SULFATE, PO, Take 1 tablet by mouth at bedtime., Disp: , Rfl:    KRILL OIL PO, Take 2 capsules by mouth in the morning and at bedtime., Disp: , Rfl:    lamoTRIgine  (LAMICTAL ) 100 MG tablet, Take 3 and 1/2 tablets twice a day, Disp: 630 tablet, Rfl: 3   levothyroxine  (SYNTHROID ) 75 MCG tablet, Take 1 tablet (75 mcg total) by mouth daily before breakfast., Disp: 90 tablet, Rfl: 3   loratadine  (CLARITIN ) 10 MG tablet,  Take 10 mg by mouth at bedtime., Disp: , Rfl:    LORazepam  (ATIVAN ) 0.5 MG tablet, Take 1 tablet as needed for seizure clusters. Do not take more than 2 a day., Disp: 10 tablet, Rfl: 5   methadone  (DOLOPHINE ) 5 MG tablet, Take 5 mg by mouth 3 (three) times daily. For restless legs 2pm, 6pm, 10 pm, Disp: , Rfl:    Multiple Vitamins-Minerals (ZINC PO), Take 1 tablet by mouth at bedtime., Disp: , Rfl:    tiZANidine  (ZANAFLEX ) 4 MG tablet, Take 0.5 tablets (2 mg total) by mouth every 12 (twelve) hours as needed for muscle spasms., Disp: 30 tablet, Rfl: 1   vitamin C (ASCORBIC ACID) 500 MG tablet, Take 500 mg by mouth daily., Disp: , Rfl:    Wheat Dextrin (BENEFIBER PO), Take 1 Scoop by mouth at bedtime., Disp: , Rfl:       Objective:  BP 126/70 (BP Location: Left Arm, Patient Position: Sitting, Cuff Size: Large)   Pulse 69   Temp 97.9 F (36.6 C) (Oral)   Ht 5' 2 (1.575 m)   Wt 175 lb (79.4 kg)   SpO2 95% Comment: RA  BMI 32.01 kg/m   {Pulm Vitals (Optional):32837}  Physical Exam   Diagnostic Review:  {Labs (Optional):32838}  Assessment & Plan:   Assessment & Plan OSA (obstructive sleep apnea)     Mild intermittent asthma, unspecified whether complicated  Orders:   albuterol  (VENTOLIN  HFA) 108 (90 Base) MCG/ACT inhaler; Inhale 1-2 puffs into the lungs every 6 (six) hours as needed.  Sarcoidosis      Assessment and Plan Assessment & Plan       No follow-ups on file.   Dorn KATHEE Chill, MD

## 2024-12-12 ENCOUNTER — Ambulatory Visit: Payer: Self-pay

## 2024-12-12 ENCOUNTER — Ambulatory Visit: Admitting: Family Medicine

## 2024-12-12 VITALS — BP 124/76 | HR 75 | Temp 98.3°F | Wt 177.0 lb

## 2024-12-12 DIAGNOSIS — R410 Disorientation, unspecified: Secondary | ICD-10-CM

## 2024-12-12 LAB — POC URINALSYSI DIPSTICK (AUTOMATED)
Bilirubin, UA: NEGATIVE
Glucose, UA: NEGATIVE
Ketones, UA: NEGATIVE
Leukocytes, UA: NEGATIVE
Nitrite, UA: NEGATIVE
Protein, UA: NEGATIVE
Spec Grav, UA: 1.015 (ref 1.010–1.025)
Urobilinogen, UA: NEGATIVE U/dL — AB
pH, UA: 6 (ref 5.0–8.0)

## 2024-12-12 NOTE — Telephone Encounter (Signed)
 FYI Only or Action Required?: FYI only for provider: appointment scheduled on 12/16.  Patient was last seen in primary care on 09/06/2024 by Catherine Fuller A, DO.  Called Nurse Triage reporting Altered Mental Status.  Symptoms began Ongoing, worsening over the past 2 weeks.  Interventions attempted: Nothing.  Symptoms are: gradually worsening.  Triage Disposition: See PCP Within 2 Weeks  Patient/caregiver understands and will follow disposition?: Yes       Copied from CRM [id number]. Topic: Clinical - Red Word Triage >> Dec 12, 2024  8:36 AM Grace Schmitt wrote: Red Word that prompted transfer to Nurse Triage: confusion, weakness in legs and tired. Patient want to see if she can get her calcium  checked because in the past she had sarcoidosis Reason for Disposition  [1] Longstanding confusion (e.g., dementia, stroke) AND [2] NO worsening or change  Answer Assessment - Initial Assessment Questions 1. LEVEL OF CONSCIOUSNESS: How are they (the patient) acting right now? (e.g., alert-oriented, confused, lethargic, stuporous, comatose)     Confusion of small things, ongoing, she stated she is 78 years old  2. ONSET: When did the confusion start?  (e.g., minutes, hours, days)     Ongoing due to age (per patient)  3. PATTERN: Does this come and go, or has it been constant since it started?  Is it present now?     Intermittent    6. CAUSE: What do you think is causing the confusion?      Age, unsure   7. OTHER SYMPTOMS: Are there any other symptoms? (e.g., difficulty breathing, fever, headache, weakness)     More fatigued than normal over the past couple of weeks. Weakness in legs, Thigh muscle weakness area noted.      Patient states when she begins to speak sometimes the wrong words come out. She states a few years ago she had similar symptoms to rule out stroke, no stroke was present but stated it was her sarcoidosis.  She is seeking to get her calcium  checked. Appt.  Advised due to the symptoms of increased confusion, fatigue and muscle weakness in legs.  Protocols used: Confusion - Delirium-A-AH
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
## 2024-12-13 ENCOUNTER — Ambulatory Visit: Payer: Self-pay | Admitting: Family Medicine

## 2024-12-13 LAB — CBC WITH DIFFERENTIAL/PLATELET
Absolute Lymphocytes: 1716 {cells}/uL (ref 850–3900)
Absolute Monocytes: 650 {cells}/uL (ref 200–950)
Basophils Absolute: 102 {cells}/uL (ref 0–200)
Basophils Relative: 1.4 %
Eosinophils Absolute: 540 {cells}/uL — ABNORMAL HIGH (ref 15–500)
Eosinophils Relative: 7.4 %
HCT: 40 % (ref 35.9–46.0)
Hemoglobin: 13.3 g/dL (ref 11.7–15.5)
MCH: 31.7 pg (ref 27.0–33.0)
MCHC: 33.3 g/dL (ref 31.6–35.4)
MCV: 95.2 fL (ref 81.4–101.7)
MPV: 9.4 fL (ref 7.5–12.5)
Monocytes Relative: 8.9 %
Neutro Abs: 4292 {cells}/uL (ref 1500–7800)
Neutrophils Relative %: 58.8 %
Platelets: 341 Thousand/uL (ref 140–400)
RBC: 4.2 Million/uL (ref 3.80–5.10)
RDW: 11.7 % (ref 11.0–15.0)
Total Lymphocyte: 23.5 %
WBC: 7.3 Thousand/uL (ref 3.8–10.8)

## 2024-12-13 LAB — COMPREHENSIVE METABOLIC PANEL WITH GFR
AG Ratio: 1.6 (calc) (ref 1.0–2.5)
ALT: 20 U/L (ref 6–29)
AST: 27 U/L (ref 10–35)
Albumin: 4.7 g/dL (ref 3.6–5.1)
Alkaline phosphatase (APISO): 81 U/L (ref 37–153)
BUN/Creatinine Ratio: 44 (calc) — ABNORMAL HIGH (ref 6–22)
BUN: 28 mg/dL — ABNORMAL HIGH (ref 7–25)
CO2: 28 mmol/L (ref 20–32)
Calcium: 9.4 mg/dL (ref 8.6–10.4)
Chloride: 100 mmol/L (ref 98–110)
Creat: 0.64 mg/dL (ref 0.60–1.00)
Globulin: 2.9 g/dL (ref 1.9–3.7)
Glucose, Bld: 115 mg/dL — ABNORMAL HIGH (ref 65–99)
Potassium: 4.8 mmol/L (ref 3.5–5.3)
Sodium: 138 mmol/L (ref 135–146)
Total Bilirubin: 0.3 mg/dL (ref 0.2–1.2)
Total Protein: 7.6 g/dL (ref 6.1–8.1)
eGFR: 90 mL/min/1.73m2 (ref >=60)

## 2024-12-13 LAB — TSH: TSH: 1.53 m[IU]/L (ref 0.40–4.50)

## 2024-12-13 LAB — PTH, INTACT AND CALCIUM
Calcium: 9.4 mg/dL (ref 8.6–10.4)
PTH: 22 pg/mL (ref 16–77)

## 2024-12-13 LAB — VITAMIN D 25 HYDROXY (VIT D DEFICIENCY, FRACTURES): Vit D, 25-Hydroxy: 38 ng/mL (ref 30–100)

## 2024-12-13 LAB — B12 AND FOLATE PANEL
Folate: >24 ng/mL
Vitamin B-12: 915 pg/mL (ref 200–1100)

## 2024-12-14 LAB — URINALYSIS W MICROSCOPIC + REFLEX CULTURE
Bacteria, UA: NONE SEEN /HPF
Bilirubin Urine: NEGATIVE
Glucose, UA: NEGATIVE
Hgb urine dipstick: NEGATIVE
Hyaline Cast: NONE SEEN /LPF
Ketones, ur: NEGATIVE
Nitrites, Initial: NEGATIVE
Protein, ur: NEGATIVE
Specific Gravity, Urine: 1.017 (ref 1.001–1.035)
Squamous Epithelial / HPF: NONE SEEN /HPF (ref <=5)
pH: 6.5 (ref 5.0–8.0)

## 2024-12-14 LAB — URINE CULTURE
MICRO NUMBER:: 17365656
Result:: NO GROWTH
SPECIMEN QUALITY:: ADEQUATE

## 2024-12-14 LAB — CULTURE INDICATED

## 2024-12-15 ENCOUNTER — Encounter: Payer: Self-pay | Admitting: Pulmonary Disease

## 2024-12-23 ENCOUNTER — Other Ambulatory Visit: Payer: Self-pay | Admitting: Family Medicine

## 2025-01-04 ENCOUNTER — Encounter: Payer: Self-pay | Admitting: Family Medicine

## 2025-01-04 ENCOUNTER — Ambulatory Visit: Admitting: Family Medicine

## 2025-01-04 VITALS — BP 122/74 | HR 68 | Temp 97.9°F | Ht 62.0 in | Wt 174.6 lb

## 2025-01-04 DIAGNOSIS — E782 Mixed hyperlipidemia: Secondary | ICD-10-CM

## 2025-01-04 DIAGNOSIS — M79604 Pain in right leg: Secondary | ICD-10-CM | POA: Diagnosis not present

## 2025-01-04 DIAGNOSIS — Z0001 Encounter for general adult medical examination with abnormal findings: Secondary | ICD-10-CM

## 2025-01-04 DIAGNOSIS — M48061 Spinal stenosis, lumbar region without neurogenic claudication: Secondary | ICD-10-CM | POA: Diagnosis not present

## 2025-01-04 DIAGNOSIS — M545 Low back pain, unspecified: Secondary | ICD-10-CM

## 2025-01-04 DIAGNOSIS — E559 Vitamin D deficiency, unspecified: Secondary | ICD-10-CM | POA: Diagnosis not present

## 2025-01-04 DIAGNOSIS — E669 Obesity, unspecified: Secondary | ICD-10-CM

## 2025-01-04 DIAGNOSIS — M5416 Radiculopathy, lumbar region: Secondary | ICD-10-CM | POA: Diagnosis not present

## 2025-01-04 DIAGNOSIS — Z131 Encounter for screening for diabetes mellitus: Secondary | ICD-10-CM | POA: Diagnosis not present

## 2025-01-04 DIAGNOSIS — M791 Myalgia, unspecified site: Secondary | ICD-10-CM

## 2025-01-04 DIAGNOSIS — E7800 Pure hypercholesterolemia, unspecified: Secondary | ICD-10-CM

## 2025-01-04 DIAGNOSIS — S39012A Strain of muscle, fascia and tendon of lower back, initial encounter: Secondary | ICD-10-CM | POA: Diagnosis not present

## 2025-01-04 DIAGNOSIS — E063 Autoimmune thyroiditis: Secondary | ICD-10-CM | POA: Diagnosis not present

## 2025-01-04 DIAGNOSIS — Z0000 Encounter for general adult medical examination without abnormal findings: Secondary | ICD-10-CM

## 2025-01-04 DIAGNOSIS — I6789 Other cerebrovascular disease: Secondary | ICD-10-CM | POA: Diagnosis not present

## 2025-01-04 DIAGNOSIS — R531 Weakness: Secondary | ICD-10-CM

## 2025-01-04 DIAGNOSIS — M8589 Other specified disorders of bone density and structure, multiple sites: Secondary | ICD-10-CM | POA: Diagnosis not present

## 2025-01-04 DIAGNOSIS — Z1231 Encounter for screening mammogram for malignant neoplasm of breast: Secondary | ICD-10-CM

## 2025-01-04 DIAGNOSIS — Z789 Other specified health status: Secondary | ICD-10-CM

## 2025-01-04 DIAGNOSIS — M4316 Spondylolisthesis, lumbar region: Secondary | ICD-10-CM

## 2025-01-04 DIAGNOSIS — R569 Unspecified convulsions: Secondary | ICD-10-CM

## 2025-01-04 LAB — LIPID PANEL
Cholesterol: 213 mg/dL — ABNORMAL HIGH (ref 28–200)
HDL: 83.8 mg/dL
LDL Cholesterol: 109 mg/dL — ABNORMAL HIGH (ref 10–99)
NonHDL: 129.65
Total CHOL/HDL Ratio: 3
Triglycerides: 103 mg/dL (ref 10.0–149.0)
VLDL: 20.6 mg/dL (ref 0.0–40.0)

## 2025-01-04 LAB — VITAMIN D 25 HYDROXY (VIT D DEFICIENCY, FRACTURES): VITD: 31.14 ng/mL (ref 30.00–100.00)

## 2025-01-04 LAB — HEMOGLOBIN A1C: Hgb A1c MFr Bld: 5.7 % (ref 4.6–6.5)

## 2025-01-04 MED ORDER — DICLOFENAC SODIUM 75 MG PO TBEC: 75.0000 mg | DELAYED_RELEASE_TABLET | Freq: Two times a day (BID) | ORAL | 5 refills | Status: AC | PRN

## 2025-01-04 MED ORDER — REPATHA 140 MG/ML ~~LOC~~ SOSY: 140.0000 mg | PREFILLED_SYRINGE | SUBCUTANEOUS | 11 refills | Status: AC

## 2025-01-04 NOTE — Patient Instructions (Addendum)
 Return in about 1 year (around 01/05/2026) for cpe (20 min), Routine chronic condition follow-up.        Great to see you today.  I have refilled the medication(s) we provide.   If labs were collected or images ordered, we will inform you of  results once we have received them and reviewed. We will contact you either by echart message, or telephone call.  Please give ample time to the testing facility, and our office to run,  receive and review results. Please do not call inquiring of results, even if you can see them in your chart. We will contact you as soon as we are able. If it has been over 1 week since the test was completed, and you have not yet heard from us , then please call us .    - echart message- for normal results that have been seen by the patient already.   - telephone call: abnormal results or if patient has not viewed results in their echart.  If a referral to a specialist was entered for you, please call us  in 2 weeks if you have not heard from the specialist office to schedule.
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
## 2025-01-08 ENCOUNTER — Ambulatory Visit: Payer: Self-pay | Admitting: Family Medicine

## 2025-01-25 ENCOUNTER — Ambulatory Visit: Payer: Self-pay | Admitting: Cardiology

## 2025-01-25 ENCOUNTER — Ambulatory Visit: Payer: PPO

## 2025-01-25 DIAGNOSIS — I495 Sick sinus syndrome: Secondary | ICD-10-CM

## 2025-01-25 LAB — CUP PACEART REMOTE DEVICE CHECK
Battery Remaining Longevity: 105 mo
Battery Remaining Percentage: 89 %
Battery Voltage: 3.01 V
Brady Statistic AP VP Percent: 1 %
Brady Statistic AP VS Percent: 13 %
Brady Statistic AS VP Percent: 1 %
Brady Statistic AS VS Percent: 82 %
Brady Statistic RA Percent Paced: 8.3 %
Brady Statistic RV Percent Paced: 1 %
Date Time Interrogation Session: 20260129020012
Implantable Lead Connection Status: 753985
Implantable Lead Connection Status: 753985
Implantable Lead Implant Date: 20240501
Implantable Lead Implant Date: 20240501
Implantable Lead Location: 753859
Implantable Lead Location: 753860
Implantable Pulse Generator Implant Date: 20240501
Lead Channel Impedance Value: 440 Ohm
Lead Channel Impedance Value: 480 Ohm
Lead Channel Pacing Threshold Amplitude: 0.75 V
Lead Channel Pacing Threshold Amplitude: 1 V
Lead Channel Pacing Threshold Pulse Width: 0.5 ms
Lead Channel Pacing Threshold Pulse Width: 0.5 ms
Lead Channel Sensing Intrinsic Amplitude: 5 mV
Lead Channel Sensing Intrinsic Amplitude: 9.6 mV
Lead Channel Setting Pacing Amplitude: 2.5 V
Lead Channel Setting Pacing Amplitude: 2.5 V
Lead Channel Setting Pacing Pulse Width: 0.5 ms
Lead Channel Setting Sensing Sensitivity: 0.5 mV
Pulse Gen Model: 2272
Pulse Gen Serial Number: 8181731

## 2025-02-02 NOTE — Progress Notes (Signed)
 Remote PPM Transmission

## 2025-02-06 ENCOUNTER — Other Ambulatory Visit (HOSPITAL_BASED_OUTPATIENT_CLINIC_OR_DEPARTMENT_OTHER)

## 2025-02-06 ENCOUNTER — Inpatient Hospital Stay (HOSPITAL_BASED_OUTPATIENT_CLINIC_OR_DEPARTMENT_OTHER): Admission: RE | Admit: 2025-02-06 | Source: Ambulatory Visit

## 2025-02-07 ENCOUNTER — Ambulatory Visit

## 2025-04-06 ENCOUNTER — Ambulatory Visit: Admitting: Neurology

## 2025-04-20 ENCOUNTER — Ambulatory Visit: Admitting: Neurology

## 2026-01-07 ENCOUNTER — Encounter: Admitting: Family Medicine
